# Patient Record
Sex: Male | Born: 1955 | Race: White | Hispanic: No | Marital: Married | Smoking: Light tobacco smoker
Health system: Southern US, Academic
[De-identification: ages and names within clinical notes are randomized; demographics above are authoritative.]

## PROBLEM LIST (undated history)

## (undated) DIAGNOSIS — M1990 Unspecified osteoarthritis, unspecified site: Secondary | ICD-10-CM

## (undated) DIAGNOSIS — Z8709 Personal history of other diseases of the respiratory system: Secondary | ICD-10-CM

## (undated) DIAGNOSIS — T4145XA Adverse effect of unspecified anesthetic, initial encounter: Secondary | ICD-10-CM

## (undated) DIAGNOSIS — G8929 Other chronic pain: Secondary | ICD-10-CM

## (undated) DIAGNOSIS — G5600 Carpal tunnel syndrome, unspecified upper limb: Secondary | ICD-10-CM

## (undated) DIAGNOSIS — N400 Enlarged prostate without lower urinary tract symptoms: Secondary | ICD-10-CM

## (undated) DIAGNOSIS — G2581 Restless legs syndrome: Secondary | ICD-10-CM

## (undated) DIAGNOSIS — F988 Other specified behavioral and emotional disorders with onset usually occurring in childhood and adolescence: Secondary | ICD-10-CM

## (undated) DIAGNOSIS — G4730 Sleep apnea, unspecified: Secondary | ICD-10-CM

## (undated) DIAGNOSIS — R531 Weakness: Secondary | ICD-10-CM

## (undated) DIAGNOSIS — M62838 Other muscle spasm: Secondary | ICD-10-CM

## (undated) DIAGNOSIS — F419 Anxiety disorder, unspecified: Secondary | ICD-10-CM

## (undated) DIAGNOSIS — M549 Dorsalgia, unspecified: Secondary | ICD-10-CM

## (undated) DIAGNOSIS — K219 Gastro-esophageal reflux disease without esophagitis: Secondary | ICD-10-CM

## (undated) DIAGNOSIS — Z860100 Personal history of colon polyps, unspecified: Secondary | ICD-10-CM

## (undated) DIAGNOSIS — T8859XA Other complications of anesthesia, initial encounter: Secondary | ICD-10-CM

## (undated) DIAGNOSIS — M5430 Sciatica, unspecified side: Secondary | ICD-10-CM

## (undated) DIAGNOSIS — Z8719 Personal history of other diseases of the digestive system: Secondary | ICD-10-CM

## (undated) DIAGNOSIS — I10 Essential (primary) hypertension: Secondary | ICD-10-CM

## (undated) DIAGNOSIS — R0600 Dyspnea, unspecified: Secondary | ICD-10-CM

## (undated) DIAGNOSIS — F319 Bipolar disorder, unspecified: Secondary | ICD-10-CM

## (undated) DIAGNOSIS — Z87898 Personal history of other specified conditions: Secondary | ICD-10-CM

## (undated) DIAGNOSIS — R519 Headache, unspecified: Secondary | ICD-10-CM

## (undated) DIAGNOSIS — Z973 Presence of spectacles and contact lenses: Secondary | ICD-10-CM

## (undated) DIAGNOSIS — K559 Vascular disorder of intestine, unspecified: Secondary | ICD-10-CM

## (undated) DIAGNOSIS — K589 Irritable bowel syndrome without diarrhea: Secondary | ICD-10-CM

## (undated) DIAGNOSIS — K649 Unspecified hemorrhoids: Secondary | ICD-10-CM

## (undated) DIAGNOSIS — N529 Male erectile dysfunction, unspecified: Secondary | ICD-10-CM

## (undated) DIAGNOSIS — E785 Hyperlipidemia, unspecified: Secondary | ICD-10-CM

## (undated) DIAGNOSIS — Z9989 Dependence on other enabling machines and devices: Secondary | ICD-10-CM

## (undated) DIAGNOSIS — H9319 Tinnitus, unspecified ear: Secondary | ICD-10-CM

## (undated) DIAGNOSIS — Z8669 Personal history of other diseases of the nervous system and sense organs: Secondary | ICD-10-CM

## (undated) HISTORY — PX: OTHER SURGICAL HISTORY: SHX169

## (undated) HISTORY — PX: VASECTOMY: SHX75

## (undated) HISTORY — DX: Bipolar disorder, unspecified: F31.9

## (undated) HISTORY — PX: HERNIA REPAIR: SHX51

## (undated) HISTORY — PX: COLONOSCOPY: SHX174

## (undated) HISTORY — DX: Other specified behavioral and emotional disorders with onset usually occurring in childhood and adolescence: F98.8

## (undated) HISTORY — PX: SHOULDER SURGERY: SHX246

## (undated) HISTORY — PX: LASIK: SHX215

## (undated) HISTORY — PX: TRANSURETHRAL RESECTION OF PROSTATE: SHX73

## (undated) HISTORY — DX: Gastro-esophageal reflux disease without esophagitis: K21.9

## (undated) HISTORY — PX: ESOPHAGOGASTRODUODENOSCOPY: SHX1529

## (undated) HISTORY — PX: HX CARPAL TUNNEL RELEASE: SHX101

## (undated) HISTORY — PX: HX APPENDECTOMY: SHX54

## (undated) HISTORY — PX: HX TURP: SHX73

## (undated) HISTORY — PX: HX LUMBAR FUSION: SHX111

## (undated) HISTORY — PX: ELBOW SURGERY: SHX618

## (undated) HISTORY — DX: Unspecified hemorrhoids: K64.9

## (undated) HISTORY — DX: Vascular disorder of intestine, unspecified: K55.9

## (undated) HISTORY — PX: ULNAR TUNNEL RELEASE: SHX820

## (undated) HISTORY — PX: HX HERNIA REPAIR: SHX51

## (undated) HISTORY — PX: HX SHOULDER SURGERY: 2100001311

## (undated) HISTORY — PX: INGUINAL HERNIA REPAIR: SUR1180

## (undated) HISTORY — PX: HX VASECTOMY: SHX75

## (undated) NOTE — Progress Notes (Signed)
 Formatting of this note might be different from the original.  Subjective   Patient ID: [name] [name] is a 52 y.o. male presenting to the Urgent Care with a chief complaint of Pain (Pain on left abdomen that radiates down left leg x 1 week- gets injections however not able to get in anytime soon).    Pain    Pt is here with complaint of chronic low back pain--has had surgeries and normally gets steroid injections in the lumbar spine from the [place] but can't get there for a month.  Would like a IM steroid shot here to hold him over.     Objective   BP 105/70 (BP Location: Left arm, Patient Position: Sitting, BP Cuff Size: Adult)   Pulse 70   Temp 36.7 C (98.1 F) (Tympanic)   Resp 18   Ht 1.778 m (5' 10)   Wt 81.6 kg (180 lb)   SpO2 97%   BMI 25.83 kg/m     Physical Exam  Vitals and nursing note reviewed.   Constitutional:       Appearance: Normal appearance.   Cardiovascular:      Rate and Rhythm: Normal rate and regular rhythm.      Heart sounds: Normal heart sounds.   Pulmonary:      Effort: Pulmonary effort is normal.      Breath sounds: Normal breath sounds.   Musculoskeletal:         General: Normal range of motion.   Skin:     General: Skin is dry.   Neurological:      General: No focal deficit present.      Mental Status: He is alert and oriented to person, place, and time.         Assessment & Plan    Assessment & Plan  Chronic left-sided low back pain with left-sided sciatica    Orders:    dexAMETHasone  (Decadron ) injection 10 mg        In-House Lab Results:   No results found for this or any previous visit.     In-House Imaging Reads:      Answered all patient questions, aftercare instructions provided.  Return precautions dicussed.  Pt understands and agrees with plan.    Procedure Documentation:  Procedures     ED Course & MDM   MDM - Medical Decision Making: Home with return precautions  Electronically signed by Royston Lolling, CRNP at 04/10/2024  9:49 AM EDT

## (undated) NOTE — Progress Notes (Signed)
 Formatting of this note might be different from the original.  Subjective   Patient ID: Sean Wall is a 52 y.o. male presenting to the Urgent Care with a chief complaint of Pain (Pain on left abdomen that radiates down left leg x 1 week- gets injections however not able to get in anytime soon).    Pain    Pt is here with complaint of chronic low back pain--has had surgeries and normally gets steroid injections in the lumbar spine from the [place] but can't get there for a month.  Would like a IM steroid shot here to hold him over.     Objective   BP 105/70 (BP Location: Left arm, Patient Position: Sitting, BP Cuff Size: Adult)   Pulse 70   Temp 36.7 C (98.1 F) (Tympanic)   Resp 18   Ht 1.778 m (5' 10)   Wt 81.6 kg (180 lb)   SpO2 97%   BMI 25.83 kg/m     Physical Exam  Vitals and nursing note reviewed.   Constitutional:       Appearance: Normal appearance.   Cardiovascular:      Rate and Rhythm: Normal rate and regular rhythm.      Heart sounds: Normal heart sounds.   Pulmonary:      Effort: Pulmonary effort is normal.      Breath sounds: Normal breath sounds.   Musculoskeletal:         General: Normal range of motion.   Skin:     General: Skin is dry.   Neurological:      General: No focal deficit present.      Mental Status: He is alert and oriented to person, place, and time.         Assessment & Plan    Assessment & Plan  Chronic left-sided low back pain with left-sided sciatica    Orders:    dexAMETHasone  (Decadron ) injection 10 mg        In-House Lab Results:   No results found for this or any previous visit.     In-House Imaging Reads:      Answered all patient questions, aftercare instructions provided.  Return precautions dicussed.  Pt understands and agrees with plan.    Procedure Documentation:  Procedures     ED Course & MDM   MDM - Medical Decision Making: Home with return precautions  Electronically signed by Royston Lolling, CRNP at 04/10/2024  9:49 AM EDT

---

## 1976-08-15 HISTORY — PX: APPENDECTOMY: SHX54

## 1976-08-15 HISTORY — PX: HX APPENDECTOMY: SHX54

## 1983-08-16 DIAGNOSIS — Z8711 Personal history of peptic ulcer disease: Secondary | ICD-10-CM

## 1983-08-16 HISTORY — DX: Personal history of peptic ulcer disease: Z87.11

## 1988-08-15 HISTORY — PX: GANGLION CYST EXCISION: SHX1691

## 1988-08-15 HISTORY — PX: HX CYST REMOVAL: SHX22

## 1989-04-25 ENCOUNTER — Encounter: Payer: Self-pay | Admitting: Internal Medicine

## 1989-08-15 HISTORY — PX: INGUINAL HERNIA REPAIR: SUR1180

## 1998-10-02 ENCOUNTER — Encounter: Payer: Self-pay | Admitting: Internal Medicine

## 1998-10-02 ENCOUNTER — Other Ambulatory Visit: Admission: RE | Admit: 1998-10-02 | Discharge: 1998-10-02 | Payer: Self-pay | Admitting: Gastroenterology

## 1998-10-05 ENCOUNTER — Other Ambulatory Visit: Admission: RE | Admit: 1998-10-05 | Discharge: 1998-10-05 | Payer: Self-pay | Admitting: Gastroenterology

## 1998-10-05 ENCOUNTER — Encounter: Payer: Self-pay | Admitting: Internal Medicine

## 1999-12-24 ENCOUNTER — Other Ambulatory Visit: Admission: RE | Admit: 1999-12-24 | Discharge: 1999-12-24 | Payer: Self-pay | Admitting: Urology

## 2002-08-15 HISTORY — PX: LASIK: SHX215

## 2003-07-21 ENCOUNTER — Ambulatory Visit (HOSPITAL_COMMUNITY): Admission: RE | Admit: 2003-07-21 | Discharge: 2003-07-21 | Payer: Self-pay | Admitting: Orthopedic Surgery

## 2003-08-16 HISTORY — PX: HX NOSE/SINUS SURGERY: 2100001179

## 2003-08-16 HISTORY — PX: CPAP: SHX449

## 2003-08-21 ENCOUNTER — Ambulatory Visit (HOSPITAL_COMMUNITY): Admission: RE | Admit: 2003-08-21 | Discharge: 2003-08-21 | Payer: Self-pay | Admitting: Orthopedic Surgery

## 2003-08-21 ENCOUNTER — Ambulatory Visit (HOSPITAL_BASED_OUTPATIENT_CLINIC_OR_DEPARTMENT_OTHER): Admission: RE | Admit: 2003-08-21 | Discharge: 2003-08-21 | Payer: Self-pay | Admitting: Orthopedic Surgery

## 2004-03-11 ENCOUNTER — Encounter: Payer: Self-pay | Admitting: Internal Medicine

## 2004-03-11 ENCOUNTER — Encounter (INDEPENDENT_AMBULATORY_CARE_PROVIDER_SITE_OTHER): Payer: Self-pay | Admitting: Gastroenterology

## 2004-06-15 ENCOUNTER — Ambulatory Visit (HOSPITAL_COMMUNITY): Admission: RE | Admit: 2004-06-15 | Discharge: 2004-06-15 | Payer: Self-pay | Admitting: Internal Medicine

## 2004-11-30 ENCOUNTER — Ambulatory Visit: Payer: Self-pay | Admitting: Internal Medicine

## 2005-02-24 ENCOUNTER — Ambulatory Visit: Payer: Self-pay | Admitting: Internal Medicine

## 2005-03-02 ENCOUNTER — Ambulatory Visit: Payer: Self-pay | Admitting: Internal Medicine

## 2005-04-01 ENCOUNTER — Ambulatory Visit: Payer: Self-pay | Admitting: Internal Medicine

## 2005-06-13 ENCOUNTER — Ambulatory Visit: Payer: Self-pay | Admitting: Internal Medicine

## 2005-07-21 ENCOUNTER — Ambulatory Visit: Payer: Self-pay | Admitting: Internal Medicine

## 2005-08-11 ENCOUNTER — Ambulatory Visit: Payer: Self-pay | Admitting: Internal Medicine

## 2005-11-21 ENCOUNTER — Ambulatory Visit: Payer: Self-pay | Admitting: Internal Medicine

## 2006-01-16 ENCOUNTER — Ambulatory Visit: Payer: Self-pay | Admitting: Internal Medicine

## 2006-03-03 ENCOUNTER — Ambulatory Visit: Payer: Self-pay | Admitting: Internal Medicine

## 2006-03-10 ENCOUNTER — Ambulatory Visit (HOSPITAL_BASED_OUTPATIENT_CLINIC_OR_DEPARTMENT_OTHER): Admission: RE | Admit: 2006-03-10 | Discharge: 2006-03-10 | Payer: Self-pay | Admitting: Orthopedic Surgery

## 2006-03-10 HISTORY — PX: ULNAR NERVE REPAIR: SHX2594

## 2006-03-22 ENCOUNTER — Ambulatory Visit: Payer: Self-pay | Admitting: Internal Medicine

## 2006-04-05 ENCOUNTER — Ambulatory Visit: Payer: Self-pay | Admitting: Internal Medicine

## 2006-05-24 ENCOUNTER — Ambulatory Visit: Payer: Self-pay | Admitting: Internal Medicine

## 2006-06-28 ENCOUNTER — Ambulatory Visit: Payer: Self-pay | Admitting: Internal Medicine

## 2006-07-12 ENCOUNTER — Ambulatory Visit: Payer: Self-pay | Admitting: Internal Medicine

## 2006-10-03 ENCOUNTER — Ambulatory Visit: Payer: Self-pay | Admitting: Internal Medicine

## 2006-11-06 ENCOUNTER — Ambulatory Visit (HOSPITAL_BASED_OUTPATIENT_CLINIC_OR_DEPARTMENT_OTHER): Admission: RE | Admit: 2006-11-06 | Discharge: 2006-11-06 | Payer: Self-pay | Admitting: Otolaryngology

## 2006-11-06 ENCOUNTER — Encounter: Payer: Self-pay | Admitting: Pulmonary Disease

## 2006-11-12 ENCOUNTER — Ambulatory Visit: Payer: Self-pay | Admitting: Internal Medicine

## 2007-01-01 ENCOUNTER — Ambulatory Visit: Payer: Self-pay | Admitting: Pulmonary Disease

## 2007-01-04 ENCOUNTER — Ambulatory Visit (HOSPITAL_COMMUNITY): Admission: RE | Admit: 2007-01-04 | Discharge: 2007-01-05 | Payer: Self-pay | Admitting: Otolaryngology

## 2007-01-04 HISTORY — PX: NASAL SEPTOPLASTY W/ TURBINOPLASTY: SHX2070

## 2007-01-29 ENCOUNTER — Ambulatory Visit: Payer: Self-pay | Admitting: Internal Medicine

## 2007-03-20 ENCOUNTER — Ambulatory Visit (HOSPITAL_BASED_OUTPATIENT_CLINIC_OR_DEPARTMENT_OTHER): Admission: RE | Admit: 2007-03-20 | Discharge: 2007-03-20 | Payer: Self-pay | Admitting: Otolaryngology

## 2007-03-24 ENCOUNTER — Ambulatory Visit: Payer: Self-pay | Admitting: Internal Medicine

## 2007-05-29 ENCOUNTER — Ambulatory Visit: Payer: Self-pay | Admitting: Ophthalmology

## 2007-07-02 ENCOUNTER — Ambulatory Visit: Payer: Self-pay | Admitting: Internal Medicine

## 2007-07-02 DIAGNOSIS — K219 Gastro-esophageal reflux disease without esophagitis: Secondary | ICD-10-CM | POA: Insufficient documentation

## 2007-07-02 LAB — CONVERTED CEMR LAB
ALT: 26 units/L (ref 0–53)
AST: 20 units/L (ref 0–37)
Albumin: 3.6 g/dL (ref 3.5–5.2)
Alkaline Phosphatase: 42 units/L (ref 39–117)
BUN: 17 mg/dL (ref 6–23)
Basophils Absolute: 0 10*3/uL (ref 0.0–0.1)
Basophils Relative: 0.3 % (ref 0.0–1.0)
Bilirubin, Direct: 0.1 mg/dL (ref 0.0–0.3)
CO2: 29 meq/L (ref 19–32)
Calcium: 8.8 mg/dL (ref 8.4–10.5)
Chloride: 108 meq/L (ref 96–112)
Cholesterol: 201 mg/dL (ref 0–200)
Creatinine, Ser: 1.1 mg/dL (ref 0.4–1.5)
Direct LDL: 147.2 mg/dL
Eosinophils Absolute: 0.2 10*3/uL (ref 0.0–0.6)
Eosinophils Relative: 4.4 % (ref 0.0–5.0)
GFR calc Af Amer: 90 mL/min
GFR calc non Af Amer: 74 mL/min
Glucose, Bld: 99 mg/dL (ref 70–99)
HCT: 40.3 % (ref 39.0–52.0)
HDL: 33 mg/dL — ABNORMAL LOW (ref >39.0)
Hemoglobin: 13.8 g/dL (ref 13.0–17.0)
Lymphocytes Relative: 36.6 % (ref 12.0–46.0)
MCHC: 34.2 g/dL (ref 30.0–36.0)
MCV: 88.7 fL (ref 78.0–100.0)
Monocytes Absolute: 0.6 10*3/uL (ref 0.2–0.7)
Monocytes Relative: 12.7 % — ABNORMAL HIGH (ref 3.0–11.0)
Neutro Abs: 2.2 10*3/uL (ref 1.4–7.7)
Neutrophils Relative %: 46 % (ref 43.0–77.0)
PSA: 1.31 ng/mL (ref 0.10–4.00)
Platelets: 289 10*3/uL (ref 150–400)
Potassium: 4.1 meq/L (ref 3.5–5.1)
RBC: 4.54 M/uL (ref 4.22–5.81)
RDW: 14 % (ref 11.5–14.6)
Sodium: 142 meq/L (ref 135–145)
TSH: 2.78 microintl units/mL (ref 0.35–5.50)
Total Bilirubin: 0.7 mg/dL (ref 0.3–1.2)
Total CHOL/HDL Ratio: 6.1
Total Protein: 6.3 g/dL (ref 6.0–8.3)
Triglycerides: 217 mg/dL (ref 0–149)
VLDL: 43 mg/dL — ABNORMAL HIGH (ref 0–40)
WBC: 4.7 10*3/uL (ref 4.5–10.5)

## 2007-07-10 ENCOUNTER — Encounter: Payer: Self-pay | Admitting: Internal Medicine

## 2007-07-11 ENCOUNTER — Ambulatory Visit: Payer: Self-pay | Admitting: Internal Medicine

## 2007-07-11 DIAGNOSIS — F319 Bipolar disorder, unspecified: Secondary | ICD-10-CM | POA: Insufficient documentation

## 2007-07-11 DIAGNOSIS — M545 Low back pain, unspecified: Secondary | ICD-10-CM | POA: Insufficient documentation

## 2007-07-11 DIAGNOSIS — M1990 Unspecified osteoarthritis, unspecified site: Secondary | ICD-10-CM | POA: Insufficient documentation

## 2007-09-13 ENCOUNTER — Encounter: Payer: Self-pay | Admitting: Internal Medicine

## 2007-09-25 ENCOUNTER — Ambulatory Visit: Payer: Self-pay | Admitting: Cardiology

## 2007-10-12 ENCOUNTER — Ambulatory Visit: Payer: Self-pay

## 2007-11-27 ENCOUNTER — Ambulatory Visit: Payer: Self-pay | Admitting: Internal Medicine

## 2007-11-27 DIAGNOSIS — E785 Hyperlipidemia, unspecified: Secondary | ICD-10-CM | POA: Insufficient documentation

## 2007-11-28 ENCOUNTER — Ambulatory Visit: Payer: Self-pay | Admitting: Psychology

## 2007-12-12 ENCOUNTER — Ambulatory Visit: Payer: Self-pay | Admitting: Psychology

## 2007-12-18 ENCOUNTER — Ambulatory Visit: Payer: Self-pay | Admitting: Psychology

## 2008-01-03 ENCOUNTER — Ambulatory Visit: Payer: Self-pay | Admitting: Psychology

## 2008-01-22 ENCOUNTER — Ambulatory Visit: Payer: Self-pay | Admitting: Psychology

## 2008-02-22 ENCOUNTER — Ambulatory Visit: Payer: Self-pay | Admitting: Internal Medicine

## 2008-02-22 LAB — CONVERTED CEMR LAB
ALT: 27 units/L (ref 0–53)
AST: 19 units/L (ref 0–37)
Albumin: 3.6 g/dL (ref 3.5–5.2)
Alkaline Phosphatase: 55 units/L (ref 39–117)
BUN: 18 mg/dL (ref 6–23)
Bilirubin, Direct: 0.1 mg/dL (ref 0.0–0.3)
CO2: 30 meq/L (ref 19–32)
Calcium: 8.9 mg/dL (ref 8.4–10.5)
Chloride: 106 meq/L (ref 96–112)
Cholesterol: 140 mg/dL (ref 0–200)
Creatinine, Ser: 1.2 mg/dL (ref 0.4–1.5)
GFR calc Af Amer: 81 mL/min
GFR calc non Af Amer: 67 mL/min
Glucose, Bld: 112 mg/dL — ABNORMAL HIGH (ref 70–99)
HDL: 34.2 mg/dL — ABNORMAL LOW (ref >39.0)
LDL Cholesterol: 94 mg/dL (ref 0–99)
Potassium: 4.5 meq/L (ref 3.5–5.1)
Sodium: 141 meq/L (ref 135–145)
Total Bilirubin: 0.6 mg/dL (ref 0.3–1.2)
Total CHOL/HDL Ratio: 4.1
Total CK: 101 units/L (ref 7–195)
Total Protein: 6.5 g/dL (ref 6.0–8.3)
Triglycerides: 60 mg/dL (ref 0–149)
VLDL: 12 mg/dL (ref 0–40)

## 2008-02-26 ENCOUNTER — Encounter: Payer: Self-pay | Admitting: Internal Medicine

## 2008-02-26 ENCOUNTER — Encounter: Payer: Self-pay | Admitting: Pulmonary Disease

## 2008-02-27 ENCOUNTER — Ambulatory Visit: Payer: Self-pay | Admitting: Internal Medicine

## 2008-02-27 DIAGNOSIS — M25519 Pain in unspecified shoulder: Secondary | ICD-10-CM | POA: Insufficient documentation

## 2008-02-27 DIAGNOSIS — N400 Enlarged prostate without lower urinary tract symptoms: Secondary | ICD-10-CM | POA: Insufficient documentation

## 2008-02-27 DIAGNOSIS — D481 Neoplasm of uncertain behavior of connective and other soft tissue: Secondary | ICD-10-CM | POA: Insufficient documentation

## 2008-02-28 ENCOUNTER — Ambulatory Visit: Payer: Self-pay | Admitting: Pulmonary Disease

## 2008-02-28 DIAGNOSIS — G4733 Obstructive sleep apnea (adult) (pediatric): Secondary | ICD-10-CM | POA: Insufficient documentation

## 2008-02-29 ENCOUNTER — Ambulatory Visit: Payer: Self-pay | Admitting: Psychology

## 2008-03-11 ENCOUNTER — Ambulatory Visit: Payer: Self-pay | Admitting: Psychology

## 2008-03-11 ENCOUNTER — Telehealth: Payer: Self-pay | Admitting: Internal Medicine

## 2008-03-19 ENCOUNTER — Encounter: Payer: Self-pay | Admitting: Pulmonary Disease

## 2008-03-20 ENCOUNTER — Telehealth: Payer: Self-pay | Admitting: Pulmonary Disease

## 2008-04-01 ENCOUNTER — Encounter: Payer: Self-pay | Admitting: Internal Medicine

## 2008-04-02 ENCOUNTER — Encounter: Payer: Self-pay | Admitting: Internal Medicine

## 2008-04-06 ENCOUNTER — Ambulatory Visit (HOSPITAL_COMMUNITY): Admission: RE | Admit: 2008-04-06 | Discharge: 2008-04-06 | Payer: Self-pay | Admitting: Surgery

## 2008-05-21 ENCOUNTER — Ambulatory Visit: Payer: Self-pay | Admitting: Pulmonary Disease

## 2008-06-04 ENCOUNTER — Ambulatory Visit: Payer: Self-pay | Admitting: Internal Medicine

## 2008-06-04 DIAGNOSIS — N400 Enlarged prostate without lower urinary tract symptoms: Secondary | ICD-10-CM | POA: Insufficient documentation

## 2008-06-25 ENCOUNTER — Telehealth (INDEPENDENT_AMBULATORY_CARE_PROVIDER_SITE_OTHER): Payer: Self-pay | Admitting: *Deleted

## 2008-07-09 ENCOUNTER — Telehealth (INDEPENDENT_AMBULATORY_CARE_PROVIDER_SITE_OTHER): Payer: Self-pay | Admitting: *Deleted

## 2008-07-19 ENCOUNTER — Encounter: Payer: Self-pay | Admitting: Pulmonary Disease

## 2008-09-10 ENCOUNTER — Ambulatory Visit: Payer: Self-pay | Admitting: Internal Medicine

## 2008-09-10 DIAGNOSIS — R109 Unspecified abdominal pain: Secondary | ICD-10-CM | POA: Insufficient documentation

## 2008-09-10 DIAGNOSIS — R198 Other specified symptoms and signs involving the digestive system and abdomen: Secondary | ICD-10-CM | POA: Insufficient documentation

## 2008-10-01 ENCOUNTER — Ambulatory Visit (HOSPITAL_COMMUNITY): Admission: RE | Admit: 2008-10-01 | Discharge: 2008-10-01 | Payer: Self-pay | Admitting: Urology

## 2008-10-01 HISTORY — PX: PROSTATE SURGERY: SHX751

## 2008-11-12 ENCOUNTER — Telehealth (INDEPENDENT_AMBULATORY_CARE_PROVIDER_SITE_OTHER): Payer: Self-pay | Admitting: *Deleted

## 2008-11-13 ENCOUNTER — Encounter: Payer: Self-pay | Admitting: Internal Medicine

## 2008-12-17 ENCOUNTER — Telehealth: Payer: Self-pay | Admitting: Internal Medicine

## 2008-12-26 ENCOUNTER — Ambulatory Visit: Payer: Self-pay | Admitting: Internal Medicine

## 2008-12-26 LAB — CONVERTED CEMR LAB
ALT: 20 units/L (ref 0–53)
AST: 16 units/L (ref 0–37)
Albumin: 3.8 g/dL (ref 3.5–5.2)
Alkaline Phosphatase: 43 units/L (ref 39–117)
BUN: 25 mg/dL — ABNORMAL HIGH (ref 6–23)
Basophils Absolute: 0 10*3/uL (ref 0.0–0.1)
Basophils Relative: 0.5 % (ref 0.0–3.0)
Bilirubin, Direct: 0 mg/dL (ref 0.0–0.3)
CO2: 32 meq/L (ref 19–32)
Calcium: 9.1 mg/dL (ref 8.4–10.5)
Chloride: 109 meq/L (ref 96–112)
Cholesterol: 136 mg/dL (ref 0–200)
Creatinine, Ser: 1.1 mg/dL (ref 0.4–1.5)
Direct LDL: 77.5 mg/dL
Eosinophils Absolute: 0.3 10*3/uL (ref 0.0–0.7)
Eosinophils Relative: 5.8 % — ABNORMAL HIGH (ref 0.0–5.0)
GFR calc non Af Amer: 73.84 mL/min (ref >60)
Glucose, Bld: 104 mg/dL — ABNORMAL HIGH (ref 70–99)
HCT: 35.5 % — ABNORMAL LOW (ref 39.0–52.0)
HDL: 37.5 mg/dL — ABNORMAL LOW (ref >39.00)
Hemoglobin: 12.1 g/dL — ABNORMAL LOW (ref 13.0–17.0)
Lymphocytes Relative: 28.4 % (ref 12.0–46.0)
Lymphs Abs: 1.3 10*3/uL (ref 0.7–4.0)
MCHC: 34.2 g/dL (ref 30.0–36.0)
MCV: 87.9 fL (ref 78.0–100.0)
Monocytes Absolute: 0.5 10*3/uL (ref 0.1–1.0)
Monocytes Relative: 10.4 % (ref 3.0–12.0)
Neutro Abs: 2.5 10*3/uL (ref 1.4–7.7)
Neutrophils Relative %: 54.9 % (ref 43.0–77.0)
Platelets: 259 10*3/uL (ref 150.0–400.0)
Potassium: 4.2 meq/L (ref 3.5–5.1)
RBC: 4.03 M/uL — ABNORMAL LOW (ref 4.22–5.81)
RDW: 14.4 % (ref 11.5–14.6)
Sodium: 145 meq/L (ref 135–145)
TSH: 1.02 microintl units/mL (ref 0.35–5.50)
Total Bilirubin: 0.7 mg/dL (ref 0.3–1.2)
Total CHOL/HDL Ratio: 4
Total Protein: 6.2 g/dL (ref 6.0–8.3)
Triglycerides: 234 mg/dL — ABNORMAL HIGH (ref 0.0–149.0)
VLDL: 46.8 mg/dL — ABNORMAL HIGH (ref 0.0–40.0)
WBC: 4.6 10*3/uL (ref 4.5–10.5)

## 2008-12-30 ENCOUNTER — Ambulatory Visit: Payer: Self-pay | Admitting: Internal Medicine

## 2009-01-20 ENCOUNTER — Ambulatory Visit: Payer: Self-pay | Admitting: Internal Medicine

## 2009-02-03 ENCOUNTER — Ambulatory Visit: Payer: Self-pay | Admitting: Internal Medicine

## 2009-02-03 ENCOUNTER — Encounter: Payer: Self-pay | Admitting: Internal Medicine

## 2009-02-03 LAB — HM COLONOSCOPY

## 2009-02-06 ENCOUNTER — Encounter: Payer: Self-pay | Admitting: Internal Medicine

## 2009-02-08 ENCOUNTER — Encounter: Payer: Self-pay | Admitting: Internal Medicine

## 2009-03-06 ENCOUNTER — Emergency Department (HOSPITAL_BASED_OUTPATIENT_CLINIC_OR_DEPARTMENT_OTHER): Admission: EM | Admit: 2009-03-06 | Discharge: 2009-03-06 | Payer: Self-pay | Admitting: Emergency Medicine

## 2009-03-08 ENCOUNTER — Emergency Department (HOSPITAL_BASED_OUTPATIENT_CLINIC_OR_DEPARTMENT_OTHER): Admission: EM | Admit: 2009-03-08 | Discharge: 2009-03-08 | Payer: Self-pay | Admitting: Emergency Medicine

## 2009-03-20 ENCOUNTER — Telehealth: Payer: Self-pay | Admitting: Internal Medicine

## 2009-03-21 ENCOUNTER — Ambulatory Visit: Payer: Self-pay | Admitting: Family Medicine

## 2009-03-21 DIAGNOSIS — J029 Acute pharyngitis, unspecified: Secondary | ICD-10-CM | POA: Insufficient documentation

## 2009-03-21 LAB — CONVERTED CEMR LAB: Rapid Strep: NEGATIVE

## 2009-06-22 ENCOUNTER — Telehealth (INDEPENDENT_AMBULATORY_CARE_PROVIDER_SITE_OTHER): Payer: Self-pay | Admitting: *Deleted

## 2009-07-03 ENCOUNTER — Telehealth (INDEPENDENT_AMBULATORY_CARE_PROVIDER_SITE_OTHER): Payer: Self-pay | Admitting: *Deleted

## 2009-07-08 ENCOUNTER — Ambulatory Visit: Payer: Self-pay | Admitting: Internal Medicine

## 2009-07-08 DIAGNOSIS — H9319 Tinnitus, unspecified ear: Secondary | ICD-10-CM | POA: Insufficient documentation

## 2009-07-29 ENCOUNTER — Telehealth (INDEPENDENT_AMBULATORY_CARE_PROVIDER_SITE_OTHER): Payer: Self-pay | Admitting: *Deleted

## 2009-07-30 ENCOUNTER — Ambulatory Visit: Payer: Self-pay | Admitting: Psychology

## 2009-08-31 ENCOUNTER — Encounter: Payer: Self-pay | Admitting: Internal Medicine

## 2009-09-11 ENCOUNTER — Ambulatory Visit: Payer: Self-pay | Admitting: Psychology

## 2009-09-25 ENCOUNTER — Ambulatory Visit: Payer: Self-pay | Admitting: Psychology

## 2010-01-25 ENCOUNTER — Ambulatory Visit: Payer: Self-pay | Admitting: Internal Medicine

## 2010-01-25 LAB — CONVERTED CEMR LAB
ALT: 20 units/L (ref 0–53)
AST: 24 units/L (ref 0–37)
Albumin: 3.9 g/dL (ref 3.5–5.2)
Alkaline Phosphatase: 38 units/L — ABNORMAL LOW (ref 39–117)
BUN: 33 mg/dL — ABNORMAL HIGH (ref 6–23)
Basophils Absolute: 0 10*3/uL (ref 0.0–0.1)
Basophils Relative: 0.5 % (ref 0.0–3.0)
Bilirubin Urine: NEGATIVE
Bilirubin, Direct: 0.1 mg/dL (ref 0.0–0.3)
CO2: 29 meq/L (ref 19–32)
Calcium: 9 mg/dL (ref 8.4–10.5)
Chloride: 108 meq/L (ref 96–112)
Cholesterol: 144 mg/dL (ref 0–200)
Creatinine, Ser: 1 mg/dL (ref 0.4–1.5)
Eosinophils Absolute: 0.2 10*3/uL (ref 0.0–0.7)
Eosinophils Relative: 5 % (ref 0.0–5.0)
GFR calc non Af Amer: 86.05 mL/min (ref >60)
Glucose, Bld: 109 mg/dL — ABNORMAL HIGH (ref 70–99)
HCT: 35.5 % — ABNORMAL LOW (ref 39.0–52.0)
HDL: 52.4 mg/dL (ref >39.00)
Hemoglobin, Urine: NEGATIVE
Hemoglobin: 12.2 g/dL — ABNORMAL LOW (ref 13.0–17.0)
Ketones, ur: NEGATIVE mg/dL
LDL Cholesterol: 82 mg/dL (ref 0–99)
Leukocytes, UA: NEGATIVE
Lymphocytes Relative: 25.1 % (ref 12.0–46.0)
Lymphs Abs: 1 10*3/uL (ref 0.7–4.0)
MCHC: 34.3 g/dL (ref 30.0–36.0)
MCV: 88.6 fL (ref 78.0–100.0)
Monocytes Absolute: 0.5 10*3/uL (ref 0.1–1.0)
Monocytes Relative: 11.6 % (ref 3.0–12.0)
Neutro Abs: 2.4 10*3/uL (ref 1.4–7.7)
Neutrophils Relative %: 57.8 % (ref 43.0–77.0)
Nitrite: NEGATIVE
PSA: 2.17 ng/mL (ref 0.10–4.00)
Platelets: 252 10*3/uL (ref 150.0–400.0)
Potassium: 4.8 meq/L (ref 3.5–5.1)
RBC: 4 M/uL — ABNORMAL LOW (ref 4.22–5.81)
RDW: 15.5 % — ABNORMAL HIGH (ref 11.5–14.6)
Sodium: 142 meq/L (ref 135–145)
Specific Gravity, Urine: >=1.03 (ref 1.000–1.030)
TSH: 1.45 microintl units/mL (ref 0.35–5.50)
Total Bilirubin: 0.2 mg/dL — ABNORMAL LOW (ref 0.3–1.2)
Total CHOL/HDL Ratio: 3
Total Protein, Urine: NEGATIVE mg/dL
Total Protein: 6.3 g/dL (ref 6.0–8.3)
Triglycerides: 46 mg/dL (ref 0.0–149.0)
Urine Glucose: NEGATIVE mg/dL
Urobilinogen, UA: 0.2 (ref 0.0–1.0)
VLDL: 9.2 mg/dL (ref 0.0–40.0)
WBC: 4.2 10*3/uL — ABNORMAL LOW (ref 4.5–10.5)
pH: 5.5 (ref 5.0–8.0)

## 2010-01-26 ENCOUNTER — Ambulatory Visit: Payer: Self-pay | Admitting: Internal Medicine

## 2010-01-27 ENCOUNTER — Ambulatory Visit: Payer: Self-pay | Admitting: Internal Medicine

## 2010-01-27 LAB — CONVERTED CEMR LAB: Testosterone: 369.07 ng/dL (ref 350.00–890.00)

## 2010-02-10 ENCOUNTER — Telehealth: Payer: Self-pay | Admitting: Internal Medicine

## 2010-03-09 ENCOUNTER — Ambulatory Visit: Payer: Self-pay | Admitting: Psychology

## 2010-03-09 ENCOUNTER — Ambulatory Visit: Payer: Self-pay | Admitting: Pulmonary Disease

## 2010-03-22 ENCOUNTER — Telehealth (INDEPENDENT_AMBULATORY_CARE_PROVIDER_SITE_OTHER): Payer: Self-pay | Admitting: *Deleted

## 2010-04-01 ENCOUNTER — Telehealth (INDEPENDENT_AMBULATORY_CARE_PROVIDER_SITE_OTHER): Payer: Self-pay | Admitting: *Deleted

## 2010-04-07 ENCOUNTER — Ambulatory Visit: Payer: Self-pay | Admitting: Internal Medicine

## 2010-04-20 ENCOUNTER — Ambulatory Visit: Payer: Self-pay | Admitting: Psychology

## 2010-05-25 ENCOUNTER — Ambulatory Visit: Payer: Self-pay | Admitting: Internal Medicine

## 2010-07-19 ENCOUNTER — Telehealth: Payer: Self-pay | Admitting: Internal Medicine

## 2010-07-20 ENCOUNTER — Ambulatory Visit: Payer: Self-pay | Admitting: Gastroenterology

## 2010-07-20 ENCOUNTER — Encounter: Payer: Self-pay | Admitting: Internal Medicine

## 2010-07-20 DIAGNOSIS — R1319 Other dysphagia: Secondary | ICD-10-CM | POA: Insufficient documentation

## 2010-07-20 DIAGNOSIS — Z860100 Personal history of colon polyps, unspecified: Secondary | ICD-10-CM | POA: Insufficient documentation

## 2010-07-23 ENCOUNTER — Ambulatory Visit (HOSPITAL_COMMUNITY)
Admission: RE | Admit: 2010-07-23 | Discharge: 2010-07-23 | Payer: Self-pay | Source: Home / Self Care | Attending: Internal Medicine | Admitting: Internal Medicine

## 2010-07-23 ENCOUNTER — Encounter: Payer: Self-pay | Admitting: Internal Medicine

## 2010-08-20 ENCOUNTER — Encounter: Payer: Self-pay | Admitting: Pulmonary Disease

## 2010-08-30 ENCOUNTER — Ambulatory Visit
Admission: RE | Admit: 2010-08-30 | Discharge: 2010-08-30 | Payer: Self-pay | Source: Home / Self Care | Attending: Internal Medicine | Admitting: Internal Medicine

## 2010-08-30 DIAGNOSIS — J0100 Acute maxillary sinusitis, unspecified: Secondary | ICD-10-CM | POA: Insufficient documentation

## 2010-09-14 NOTE — Procedures (Signed)
Summary: Upper Endoscopy w/DIL  Patient: [name] Note: All result statuses are Final unless otherwise noted.  Tests: (1) Upper Endoscopy w/DIL (UED)  UED Upper Endoscopy w/DIL                             DONE (C)     [facility]     [address] [place], [place]  [address]           ENDOSCOPY PROCEDURE REPORT           PATIENT:  [name] [name], [name] [name]  MR#:  [id number]     BIRTHDATE:  06-24-1956, 54 yrs. old  GENDER:  male           ENDOSCOPIST:  Iva Boop, MD, [facility]           PROCEDURE DATE:  07/23/2010     PROCEDURE:  EGD, diagnostic, Maloney Dilation of the Esophagus     ASA CLASS:  Class II     INDICATIONS:  1) dysphagia           MEDICATIONS:   Fentanyl 75 CORRECTION 50 mcg IV, Versed 6 mg IV,     Benadryl 25 mg IV     TOPICAL ANESTHETIC:  Cetacaine Spray           DESCRIPTION OF PROCEDURE:   After the risks benefits and     alternatives of the procedure were thoroughly explained, informed     consent was obtained.  The Pentax EGD [id number] endoscope was     introduced through the mouth and advanced to the second portion of     the duodenum, without limitations.  The instrument was slowly     withdrawn as the mucosa was carefully examined.     <<PROCEDUREIMAGES>>           The upper, middle, and distal third of the esophagus were     carefully inspected and no abnormalities were noted but the upper     esophageal sphincter region was subjectively stenotic by feel. The     z-line was well seen at the GEJ (40 cm). The endoscope was pushed     into the fundus which was normal including a retroflexed view. The     antrum, first and second part of the duodenum were unremarkable.     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           REPEAT EXAM:  In for as needed.           Iva Boop, MD, [facility]           CC:  The Patient           n.     REVISED:  07/23/2010 01:31 PM     eSIGNED:  Iva Boop at 07/23/2010 01:31 PM           Kemppainen, Onalee Hua, [id number]  Note: An exclamation mark (!) indicates a result that was not dispersed into the flowsheet. Document Creation Date: 07/23/2010 1:31 PM _______________________________________________________________________  (1) Order result status: Final Collection or observation date-time: 07/23/2010 13:13 Requested date-time:  Receipt date-time:  Reported date-time:  Referring Physician:   Ordering Physician: Stan Head [phone]) Specimen Source:  Source: Launa Grill Order Number: [id number] Lab site:

## 2010-09-14 NOTE — Letter (Signed)
Summary: EGD Instructions  [facility]  [address] [place], [place] [address]   Phone: [phone]  Fax: [phone]       [name] [name]    [birth year]    MRN: [id number]       Procedure Day /Date:07-23-2010     Arrival Time: 10:30 _     Procedure time: 11:30 AM    Location of Procedure:                     X    [facility] ( Outpatient Registration)    PREPARATION FOR ENDOSCOPY   On 07-23-2010 THE DAY OF THE PROCEDURE:  1.   No solid foods, milk or milk products are allowed after midnight the night before your procedure.  2.   Do not drink anything colored red or purple.  Avoid juices with pulp.  No orange juice.  3.  You may drink clear liquids until 9:30 AM which is 2 hours before your procedure.                                                                                                CLEAR LIQUIDS INCLUDE: Water Jello Ice Popsicles Tea (sugar ok, no milk/cream) Powdered fruit flavored drinks Coffee (sugar ok, no milk/cream) Gatorade Juice: apple, white grape, white cranberry  Lemonade Clear bullion, consomm, broth Carbonated beverages (any kind) Strained chicken noodle soup Hard Candy   MEDICATION INSTRUCTIONS  Unless otherwise instructed, you should take regular prescription medications with a small sip of water as early as possible the morning of your procedure.         OTHER INSTRUCTIONS  You will need a responsible adult at least 57 years of age to accompany you and drive you home.   This person must remain in the waiting room during your procedure.  Wear loose fitting clothing that is easily removed.  Leave jewelry and other valuables at home.  However, you may wish to bring a book to read or an iPod/MP3 player to listen to music as you wait for your procedure to start.  Remove all body piercing jewelry and leave at home.  Total time from sign-in until discharge is approximately 2-3 hours.  You should go home  directly after your procedure and rest.  You can resume normal activities the day after your procedure.  The day of your procedure you should not:   Drive   Make legal decisions   Operate machinery   Drink alcohol   Return to work  You will receive specific instructions about eating, activities and medications before you leave.    The above instructions have been reviewed and explained to me by   _______________________    I fully understand and can verbalize these instructions _____________________________ Date _________     Appended Document: EGD Instructions [name] [name], pt's wife the instructions for the EGD scheduled for 07-23-2010 at 11:30 AM.

## 2010-09-14 NOTE — Progress Notes (Signed)
Summary: Triage   Phone Note Call from Patient Call back at Home Phone [phone] Call back at [phone]   Caller: Patient Call For: Dr. Leone Payor Reason for Call: Talk to Nurse Summary of Call: Pt ishaving some problems swallowing, when he eats bread especially,  Initial call taken by: [place] Johnson,  July 19, 2010 4:11 PM  Follow-up for Phone Call        Called and spoke with patient's wife, she states the patient is c/o problems with intermittent dysphagia. Offered appointment with Mike Gip, PA, waiting for patient to call back for appointment date and time. Selinda Michaels RN  July 19, 2010 4:30 PM  Additional Follow-up for Phone Call Additional follow up Details #1::        Patient called back and given appointment date and time. Additional Follow-up by: Selinda Michaels RN,  July 19, 2010 4:34 PM

## 2010-09-14 NOTE — Assessment & Plan Note (Signed)
Summary: 6 WK ROV /NWS #   Vital Signs:  Patient profile:   55 year old male Height:      69 inches Weight:      185 pounds BMI:     27.42 Temp:     97.1 degrees F oral Pulse rate:   64 / minute Pulse rhythm:   regular Resp:     16 per minute BP sitting:   120 / 82  (left arm) Cuff size:   regular  Vitals Entered By: Lanier Prude, CMA(AAMA) (May 25, 2010 7:51 AM) CC: 6 wk f/u Is Patient Diabetic? No   Primary Care Provider:  Tresa Garter MD  CC:  6 wk f/u.  History of Present Illness: F/u depression, stress, arthralgias C/o pain in shoulders  and LBP 5-6/10 daily  Current Medications (verified): 1)  Zegerid 40-1100 Mg Caps (Omeprazole-Sodium Bicarbonate) .Marland Kitchen.. 1 Qd 2)  Flonase 50 Mcg/act Susp (Fluticasone Propionate) .... 2 Spr Qd 3)  Simvastatin 40 Mg  Tabs (Simvastatin) .... Once Daily 4)  Aspir-Low 81 Mg Tbec (Aspirin) .Marland Kitchen.. 1 Once Daily Pc (On Hold) 5)  Vitamin D3 1000 Unit  Tabs (Cholecalciferol) .Marland Kitchen.. 1 Qd 6)  Celebrex 200 Mg Caps (Celecoxib) .Marland Kitchen.. 1 By Mouth Once Daily Pc 7)  Clonazepam 1 Mg Tabs (Clonazepam) .Marland Kitchen.. 1 By Mouth At Bedtime As Needed 8)  Hydrocodone-Acetaminophen 5-325 Mg Tabs (Hydrocodone-Acetaminophen) .Marland Kitchen.. 1 By Mouth Up To 4 Times Per Day As Needed For Pain 9)  Zolpidem Tartrate 10 Mg  Tabs (Zolpidem Tartrate) .... 1/2 or 1 By Mouth At [facility] Prn 10)  Wellbutrin Xl 300 Mg Xr24h-Tab (Bupropion Hcl) .Marland Kitchen.. 1 By Mouth Qd 11)  Skelaxin 800 Mg Tabs (Metaxalone) .Marland Kitchen.. 1 By Mouth Two or Four Times A Day As Needed Back Spasm 12)  Amrix 15 Mg Xr24h-Cap (Cyclobenzaprine Hcl) .Marland Kitchen.. 1 By Mouth Qpm For Spasms in Muscles 13)  Abilify 15 Mg Tabs (Aripiprazole) .Marland Kitchen.. 1 By Mouth Qd  Allergies (verified): 1)  Strattera (Atomoxetine Hcl)  Past History:  Past Medical History: Last updated: 06/04/2008 OSA on CPAP Bipolar disorder  Dr Rolla Plate psychologist, Dr Donell Beers ADD (?) GERD Low back pain Osteoarthritis Shoulder pain B L eye tear duct  infection Hyperlipidemia Benign prostatic hypertrophy  Past Surgical History: Last updated: 01/26/2010 Shoulder surgery L, R Appendectomy Hernia Surgery Prostate surgery 2010 Dr Isabel Caprice  Social History: Last updated: 12/30/2008 Occupation: Runner, broadcasting/film/video; coaching [employer] Married Never Smoked Stopped drinking 2009 Drug use-no  Review of Systems  The patient denies chest pain, dyspnea on exertion, and abdominal pain.    Physical Exam  General:  Well-developed,well-nourished,in no acute distress; alert,appropriate and cooperative throughout examination Nose:  nares are boggy/ mildly congested Mouth:  mild erythema diffusely in throat - no swelling/ exudate or ulcers Lungs:  Normal respiratory effort, chest expands symmetrically. Lungs are clear to auscultation, no crackles or wheezes. Heart:  Normal rate and regular rhythm. S1 and S2 normal without gallop, murmur, click, rub or other extra sounds. Abdomen:  soft and non-tender.   Msk:  no acute or chronic joint changes  B shoulders tender w/ROM Neurologic:  No cranial nerve deficits noted. Station and gait are normal. Plantar reflexes are down-going bilaterally. DTRs are symmetrical throughout. Sensory, motor and coordinative functions appear intact. Skin:  Intact without suspicious lesions or rashes Psych:  Oriented X3, good eye contact, and not suicidal.  not homicidal and depressed affect.     Impression & Recommendations:  Problem # 1:  SHOULDER PAIN (ICD-719.41) Assessment  Deteriorated Hold Simvastatin x 3-4 wks His updated medication list for this problem includes:    Aspir-low 81 Mg Tbec (Aspirin) .Marland Kitchen... 1 once daily pc (on hold)    Celebrex 200 Mg Caps (Celecoxib) .Marland Kitchen... 1 by mouth once daily pc    Hydrocodone-acetaminophen 5-325 Mg Tabs (Hydrocodone-acetaminophen) .Marland Kitchen... 1 by mouth up to 4 times per day as needed for pain    Skelaxin 800 Mg Tabs (Metaxalone) .Marland Kitchen... 1 by mouth two or four times a day as needed back spasm     Amrix 15 Mg Xr24h-cap (Cyclobenzaprine hcl) .Marland Kitchen... 1 by mouth qpm for spasms in muscles    Arthrotec 75-200 Mg-mcg Tabs (Diclofenac-misoprostol) .Marland Kitchen... 1 by mouth once daily-bid pc  Problem # 2:  STRESS DISORDER (ICD-V62.89) Assessment: Unchanged  On the regimen of medicine(s) reflected in the chart    Problem # 3:  HYPERLIPIDEMIA (ICD-272.4) Assessment: Unchanged as per #1 His updated medication list for this problem includes:    Simvastatin 40 Mg Tabs (Simvastatin) ..... Once daily  Problem # 4:  LOW BACK PAIN (ICD-724.2) Assessment: Deteriorated As per #1 His updated medication list for this problem includes:    Aspir-low 81 Mg Tbec (Aspirin) .Marland Kitchen... 1 once daily pc (on hold)    Celebrex 200 Mg Caps (Celecoxib) .Marland Kitchen... 1 by mouth once daily pc    Hydrocodone-acetaminophen 5-325 Mg Tabs (Hydrocodone-acetaminophen) .Marland Kitchen... 1 by mouth up to 4 times per day as needed for pain    Skelaxin 800 Mg Tabs (Metaxalone) .Marland Kitchen... 1 by mouth two or four times a day as needed back spasm    Amrix 15 Mg Xr24h-cap (Cyclobenzaprine hcl) .Marland Kitchen... 1 by mouth qpm for spasms in muscles    Arthrotec 75-200 Mg-mcg Tabs (Diclofenac-misoprostol) .Marland Kitchen... 1 by mouth once daily-bid pc  Problem # 5:  BIPOLAR DEPRESSION (ICD-296.7) Assessment: Improved  Complete Medication List: 1)  Zegerid 40-1100 Mg Caps (Omeprazole-sodium bicarbonate) .Marland Kitchen.. 1 qd 2)  Flonase 50 Mcg/act Susp (Fluticasone propionate) .... 2 spr qd 3)  Simvastatin 40 Mg Tabs (Simvastatin) .... Once daily 4)  Aspir-low 81 Mg Tbec (Aspirin) .Marland Kitchen.. 1 once daily pc (on hold) 5)  Vitamin D3 1000 Unit Tabs (Cholecalciferol) .Marland Kitchen.. 1 qd 6)  Celebrex 200 Mg Caps (Celecoxib) .Marland Kitchen.. 1 by mouth once daily pc 7)  Clonazepam 1 Mg Tabs (Clonazepam) .Marland Kitchen.. 1 by mouth at bedtime as needed 8)  Hydrocodone-acetaminophen 5-325 Mg Tabs (Hydrocodone-acetaminophen) .Marland Kitchen.. 1 by mouth up to 4 times per day as needed for pain 9)  Zolpidem Tartrate 10 Mg Tabs (Zolpidem tartrate) .... 1/2 or 1  by mouth at hs prn 10)  Wellbutrin Xl 300 Mg Xr24h-tab (Bupropion hcl) .Marland Kitchen.. 1 by mouth qd 11)  Skelaxin 800 Mg Tabs (Metaxalone) .Marland Kitchen.. 1 by mouth two or four times a day as needed back spasm 12)  Amrix 15 Mg Xr24h-cap (Cyclobenzaprine hcl) .Marland Kitchen.. 1 by mouth qpm for spasms in muscles 13)  Abilify 15 Mg Tabs (Aripiprazole) .Marland Kitchen.. 1 by mouth qd 14)  Arthrotec 75-200 Mg-mcg Tabs (Diclofenac-misoprostol) .Marland Kitchen.. 1 by mouth once daily-bid pc  Patient Instructions: 1)  Hold Simvastatin 2)  Please schedule a follow-up appointment in 3 months. 3)  BMP prior to visit, ICD-9:272.0 995.20 4)  Hepatic Panel prior to visit, ICD-9: 5)  Lipid Panel prior to visit, ICD-9: 6)  CK Prescriptions: ARTHROTEC 75-200 MG-MCG TABS (DICLOFENAC-MISOPROSTOL) 1 by mouth once daily-bid pc  #180 x 1   Entered and Authorized by:   Tresa Garter MD   Signed by:  Tresa Garter MD on 05/25/2010   Method used:   Print then Give to Patient   RxID:   [id number]

## 2010-09-14 NOTE — Progress Notes (Signed)
Summary: DME  Phone Note Call from Patient Call back at Home Phone [phone]   Caller: Patient Call For: clance Reason for Call: Talk to Nurse Summary of Call: pt has heard nothing re: [facility] setting pt up for New mask possible auto machine.  Please respond asap/  Been waiting over a week and no one has contacted patient. Initial call taken by: Eugene Gavia,  March 22, 2010 1:51 PM  Follow-up for Phone Call        libby this order was sent 03/09/10 and pt still has not heard from company   Philipp Deputy [facility]  March 22, 2010 2:18 PM   Additional Follow-up for Phone Call Additional follow up Details #1::        spoke [email or web address]  in May and she states they never recieved the order-refaxed order to them  Additional Follow-up by: Oneita Jolly,  March 22, 2010 2:37 PM
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
## 2010-09-14 NOTE — Progress Notes (Signed)
Summary: cpap  Phone Note Call from Patient Call back at Home Phone [phone]   Caller: Patient Call For: clance Summary of Call: Pt states he still hasn't heard from [facility] about his cpap, ok to leave info with wife if he isn't home. Initial call taken by: Darletta Moll,  April 01, 2010 3:19 PM  Follow-up for Phone Call        Spoke with PCC's-they will ck on this for the pt.Reynaldo Minium CMA  April 01, 2010 3:50 PM   Additional Follow-up for Phone Call Additional follow up Details #1::        spoke to wife she is aware that [facility] states they got an approval for pt's cpap machine and mask on 03/30/10 and will be giving them a call asap wife also informed if she does not hear back from them to call me back Additional Follow-up by: Oneita Jolly,  April 01, 2010 4:16 PM

## 2010-09-14 NOTE — Assessment & Plan Note (Signed)
Summary: Dysphagia/LRH    History of Present Illness Primary GI MD: Stan Head MD [facility] Primary Provider: Tresa Garter MD Chief Complaint: Increasingly worse solid food dysphagia with meats and breads x 2 weeks.  Pt denies N/V.  History of Present Illness:   55 YO MALE KNOWN TO DR. Leone Payor WITH HX OF COLON POLYPS.LAST COLONOSCOPY 6/10-NOPOLYPS,INTERNAL HEMORRHOIDS FOUND.  HE COMES IN TODAY WITH A NEW PROBLEM. ACUTE ONSET ABOUT 2 WEEKS AGO WITH SOLID FOOD DYSPHAGIA. HE HAS NOT HAD ANY PROBLEMS IN THE PAST. HE STAYS ON OMEPRAZOLE 40 MG DAILY,AND HAS DONE SO FOR YEARS ,BUT DOESN'T REMEMBER HAVING ONGOING HEARTBURN ISSUES.   HE DENIES ANY ODYNOPHAGIA, AND THUS FAR HAS BEEN ABLE TO  GET FOOD TO PASS IF HE DRINKS SOME LIQUIDS . NO REGURGITATION. PRIMARILY  HAVING TROUBLE WITH MEAT AND BREAD,AND SXS OCCURRING DAILY. NO ABDOMINAL PAIN. HE HAS LOST 30 POUNDS OVER THE PAST 4 MONTHS BUT MOST OF THIS HAS BEEN INTENTIONAL AND DUE TO "STRESS". HE TAKES CELEBREX AND IS ALSO ON ARTHROTEC FOR ARTHRALGIAS.   GI Review of Systems    Reports dysphagia with solids.      Denies abdominal pain, acid reflux, belching, bloating, chest pain, dysphagia with liquids, heartburn, loss of appetite, nausea, vomiting, vomiting blood, weight loss, and  weight gain.        Denies anal fissure, black tarry stools, change in bowel habit, constipation, diarrhea, diverticulosis, fecal incontinence, heme positive stool, hemorrhoids, irritable bowel syndrome, jaundice, light color stool, liver problems, rectal bleeding, and  rectal pain.    Current Medications (verified): 1)  Omeprazole 40 Mg Cpdr (Omeprazole) .... One Capsule By Mouth Once Daily 2)  Flonase 50 Mcg/act Susp (Fluticasone Propionate) .... 2 Spr Qd 3)  Vitamin D3 1000 Unit  Tabs (Cholecalciferol) .Marland Kitchen.. 1 Qd 4)  Celebrex 200 Mg Caps (Celecoxib) .Marland Kitchen.. 1 By Mouth Once Daily Pc 5)  Clonazepam 1 Mg Tabs (Clonazepam) .Marland Kitchen.. 1 By Mouth At Bedtime As Needed 6)   Hydrocodone-Acetaminophen 5-325 Mg Tabs (Hydrocodone-Acetaminophen) .Marland Kitchen.. 1 By Mouth Up To 4 Times Per Day As Needed For Pain 7)  Zolpidem Tartrate 10 Mg  Tabs (Zolpidem Tartrate) .... 1/2 or 1 By Mouth At [facility] Pr[facility] 8)  Wellbutrin Xl 300 Mg Xr24h-Tab (Bupropion Hcl) .Marland Kitchen.. 1 By Mouth Qd 9)  Skelaxin 800 Mg Tabs (Metaxalone) .Marland Kitchen.. 1 By Mouth Two or Four Times A Day As Needed Back Spasm 10)  Amrix 15 Mg Xr24h-Cap (Cyclobenzaprine Hcl) .Marland Kitchen.. 1 By Mouth Qpm For Spasms in Muscles 11)  Abilify 15 Mg Tabs (Aripiprazole) .Marland Kitchen.. 1 By Mouth Qd 12)  Arthrotec 75-200 Mg-Mcg Tabs (Diclofenac-Misoprostol) .Marland Kitchen.. 1 By Mouth Once Daily-Bid Pc 13)  Super B Complex  Tabs (B Complex-C) .... One Tablet By Mouth Once Daily  Allergies (verified): 1)  Strattera (Atomoxetine Hcl)  Past History:  Past Medical History: OSA on CPAP Bipolar disorder  Dr Rolla Plate psychologist, Dr Donell Beers ADD (?) GERD Low back pain Osteoarthritis Shoulder pain B L eye tear duct infection Hyperlipidemia Benign prostatic hypertrophy hx of colon polyps  Past Surgical History: Shoulder surgery L, R Appendectomy Hernia Surgery Prostate surgery 2010 Dr Isabel Caprice Colonoscopy 6/10- Leone Payor, no polyps  Family History: Reviewed history from 01/26/2010 and no changes required. Family History Diabetes 1st degree relative Family History of Asthma Family History of CAD Male 1st degree relative <50 F alpha antitrypsin F lung ca  Social History: Reviewed history from 12/30/2008 and no changes required. Occupation: Runner, broadcasting/film/video; Scientist, research (life sciences) Married Never Smoked Stopped drinking  2009 Drug use-no  Review of Systems  The patient denies allergy/sinus, anemia, anxiety-new, arthritis/joint pain, back pain, blood in urine, breast changes/lumps, change in vision, confusion, cough, coughing up blood, depression-new, fainting, fatigue, fever, headaches-new, hearing problems, heart murmur, heart rhythm changes, itching, menstrual pain, muscle  pains/cramps, night sweats, nosebleeds, pregnancy symptoms, shortness of breath, skin rash, sleeping problems, sore throat, swelling of feet/legs, swollen lymph glands, thirst - excessive , urination - excessive , urination changes/pain, urine leakage, vision changes, and voice change.         see hpi  Vital Signs:  Patient profile:   55 year old male Height:      69 inches Weight:      181.38 pounds BMI:     26.88 Pulse rate:   68 / minute Pulse rhythm:   regular BP sitting:   110 / 76  (left arm) Cuff size:   regular  Vitals Entered By: Christie Nottingham CMA [facility]) (July 20, 2010 8:31 AM)  Physical Exam  General:  Well developed, well nourished, no acute distress. Head:  Normocephalic and atraumatic. Eyes:  PERRLA, no icterus. Lungs:  Clear throughout to auscultation. Heart:  Regular rate and rhythm; no murmurs, rubs,  or bruits. Abdomen:  soft, nontender, no mass or hsm,bs+ Rectal:  not done Extremities:  No clubbing, cyanosis, edema or deformities noted. Neurologic:  Alert and  oriented x4;  grossly normal neurologically. Psych:  Alert and cooperative. Normal mood and affect.   Impression & Recommendations:  Problem # 1:  DYSPHAGIA (ICD-787.29) Assessment New  54 YO MALE WITH NEW ONSET SOLID FOOD DYSPHAGIA X 2 WEEKS. R/O PEPTIC STRICTURE,R/O OCCULT LESION.  DISCUSSED SOFT DIET, CHOPPED MEATS ETC. CONTINUE OMEPRAZOLE 40 MG DAILY IN AM SCHEDULE FOR UPPER ENDOSCOPY WITH DR. Leone Payor WITH PROBABLE DILATION. PROCEDURE,RISKS AND BENEFITS DISCUSSED WITH PT. PT  TAKING 2 DIFFERENT NSAIDS, ADVISED TO STOP ONE OF THEM, HE WILL STOP CELEBREX AND CONTINUE ARTHROTEC.  Orders: ZENDO with Sav. Dil (ZENDO/Sav Dil.)  Problem # 2:  PERSONAL HX COLONIC POLYPS (ICD-V12.72) Assessment: Comment Only UP TO DATE, LAST COLON 6/10-NEGATIVE  Problem # 3:  DEPRESSION (ICD-311) Assessment: Comment Only  Problem # 4:  OBSTRUCTIVE SLEEP APNEA (ICD-327.23) Assessment: Comment  Only  Problem # 5:  OSTEOARTHRITIS (ICD-715.90) Assessment: Comment Only  Patient Instructions: 1)  Stay on the Prilosec 40mg  daily. 2)  Stop the Celebrex. 3)  We are schedulingthe Endoscopy w/ dilation in our Endoscopy suite on the 4th floor with Dr. Stan Head. 4)  We will call you with the date. Marland Kitchen  5)  Copy sent to : Dr. Macarthur Critchley Plotnikov 6)  The medication list was reviewed and reconciled.  All changed / newly prescribed medications were explained.  A complete medication list was provided to the patient / caregiver.

## 2010-09-14 NOTE — Letter (Signed)
Summary: [facility]  [facility]   Imported By: Sherian Rein 09/11/2009 13:43:45  _____________________________________________________________________  External Attachment:    Type:   Image     Comment:   External Document

## 2010-09-14 NOTE — Progress Notes (Signed)
Summary: Zocor-allergy clarification  Phone Note From Pharmacy   Caller: [facility] Action Taken: Prescription resent Summary of Call: Rec fax from [facility] requesting clarification on allergy to Zocor.  I spoke to pt...he states he is currently taking simvastatin with no known reactions/allergies. Sent new Rx for med to pharmacy. Is it ok to remove this med from allergy list? Initial call taken by: Lanier Prude, [facility]),  February 10, 2010 2:22 PM  Follow-up for Phone Call        He states he is ok Follow-up by: Tresa Garter MD,  February 11, 2010 8:37 AM    Prescriptions: SIMVASTATIN 40 MG  TABS (SIMVASTATIN) once daily  #90 x 3   Entered by:   Lanier Prude, [facility])   Authorized by:   Tresa Garter MD   Signed by:   Lanier Prude, CMA(AAMA) on 02/10/2010   Method used:   Faxed to ...       [facility] Environmental education officer)       [address] [address]       [place], [place]  [address]       Ph: [phone]       Fax: [phone]   RxID:   [id number]

## 2010-09-14 NOTE — Assessment & Plan Note (Signed)
Summary: rov for osa   Copy to:  Dr. Annalee Genta Primary Provider/Referring Provider:  Tresa Garter MD  CC:  Pt here for follow up on OSA. Pt c/o  full face mask has leak. Pt states is using the [employer]. Pt states has been taking mask off in the middle of the night.  History of Present Illness: The pt comes in today for f/u of his osa.  He has been wearing cpap compliantly, but stil having issues with mask fit and pulling the mask off during the middle of the night.  He continues to believe it is still helping him with regards to restorative sleep and daytime alertness.  He felt he did very well with the auto device when we were optimiziing his pressure, but his insurance has not allowed [place] to get him on that particular machine.  He has tried various masks in the past, but none of the newer models  Preventive Screening-Counseling & Management  Alcohol-Tobacco     Smoking Status: never  Current Medications (verified): 1)  Zegerid 40-1100 Mg Caps (Omeprazole-Sodium Bicarbonate) .Marland Kitchen.. 1 Qd 2)  Flonase 50 Mcg/act Susp (Fluticasone Propionate) .... 2 Spr Qd 3)  Simvastatin 40 Mg  Tabs (Simvastatin) .... Once Daily 4)  Aspir-Low 81 Mg Tbec (Aspirin) .Marland Kitchen.. 1 Once Daily Pc (On Hold) 5)  Vitamin D3 1000 Unit  Tabs (Cholecalciferol) .Marland Kitchen.. 1 Qd 6)  Celebrex 200 Mg Caps (Celecoxib) .Marland Kitchen.. 1 By Mouth Once Daily Pc 7)  Clonazepam 1 Mg Tabs (Clonazepam) .Marland Kitchen.. 1 By Mouth At Bedtime As Needed 8)  Hydrocodone-Acetaminophen 5-325 Mg Tabs (Hydrocodone-Acetaminophen) .Marland Kitchen.. 1 By Mouth Up To 4 Times Per Day As Needed For Pain 9)  Zolpidem Tartrate 10 Mg  Tabs (Zolpidem Tartrate) .... 1/2 or 1 By Mouth At [facility] Prn 10)  Wellbutrin Xl 300 Mg Xr24h-Tab (Bupropion Hcl) .Marland Kitchen.. 1 By Mouth Qd 11)  Skelaxin 800 Mg Tabs (Metaxalone) .Marland Kitchen.. 1 By Mouth Two or Four Times A Day As Needed Back Spasm 12)  Amrix 15 Mg Xr24h-Cap (Cyclobenzaprine Hcl) .Marland Kitchen.. 1 By Mouth Qpm For Spasms in Muscles  Allergies (verified): 1)  Strattera  (Atomoxetine Hcl)  Review of Systems       The patient complains of shortness of breath with activity, acid heartburn, indigestion, nasal congestion/difficulty breathing through nose, anxiety, and depression.  The patient denies shortness of breath at rest, productive cough, non-productive cough, coughing up blood, chest pain, irregular heartbeats, loss of appetite, weight change, abdominal pain, difficulty swallowing, sore throat, tooth/dental problems, headaches, sneezing, itching, ear ache, hand/feet swelling, joint stiffness or pain, rash, change in color of mucus, and fever.    Vital Signs:  Patient profile:   55 year old male Height:      69 inches Weight:      196.8 pounds BMI:     29.17 O2 Sat:      96 % on Room air Temp:     98.4 degrees F oral Pulse rate:   69 / minute BP sitting:   128 / 90  (left arm) Cuff size:   regular  Vitals Entered By: Zackery Barefoot CMA (March 09, 2010 11:53 AM)  O2 Flow:  Room air CC: Pt here for follow up on OSA. Pt c/o  full face mask has leak. Pt states is using the [employer]. Pt states has been taking mask off in the middle of the night Comments Medications reviewed with patient Verified contact number and pharmacy with patient Zackery Barefoot CMA  March 09, 2010 11:54 AM    Physical Exam  General:  wd male in nad Nose:  no skin breakdown or pressure necrosis from the cpap mask Extremities:  no edema or cyanosis Neurologic:  alert, not sleepy, moves all four.   Impression & Recommendations:  Problem # 1:  OBSTRUCTIVE SLEEP APNEA (ICD-327.23) the pt is doing adequately with cpap, but is still having mask fit and pressure issues.  Will have his dme company show him some of the newer masks, and will see if we can work out a way to get him an Forensic psychologist.  At least he is getting benefit from his current setup.   I have also asked him to work on modest weight loss   Medications Added to Medication List This Visit: 1)  Aspir-low 81  Mg Tbec (Aspirin) .Marland Kitchen.. 1 once daily pc (on hold) 2)  Celebrex 200 Mg Caps (Celecoxib) .Marland Kitchen.. 1 by mouth once daily pc  Other Orders: Est. Patient Level III ([address]) DME Referral (DME)  Patient Instructions: 1)  will get your dme to show you different masks to try to see if fit is better. 2)  will look at other alternatives for getting you an auto machine 3)  let me know if not doing well with cpap 4)  followup with me in one year or sooner if having issues.

## 2010-09-14 NOTE — Assessment & Plan Note (Signed)
Summary: follow up to refill rx-lb   Vital Signs:  Patient profile:   55 year old male Height:      69 inches Weight:      189.13 pounds BMI:     28.03 O2 Sat:      97 % on Room air Temp:     98.6 degrees F oral Pulse rate:   62 / minute BP sitting:   104 / 70  (left arm) Cuff size:   regular  O2 Flow:  Room air CC: Follow-up visit/discuss meds and med refill./kb Is Patient Diabetic? No Pain Assessment Patient in pain? no        Primary Care Provider:  Plotnikov  CC:  Follow-up visit/discuss meds and med refill./kb.  History of Present Illness: The patient presents for a wellness examination   Current Medications (verified): 1)  Zegerid 40-1100 Mg Caps (Omeprazole-Sodium Bicarbonate) .Marland Kitchen.. 1 Qd 2)  Flonase 50 Mcg/act Susp (Fluticasone Propionate) .... 2 Spr Qd 3)  Simvastatin 40 Mg  Tabs (Simvastatin) .... Once Daily 4)  Aspir-Low 81 Mg Tbec (Aspirin) .Marland Kitchen.. 1 Once Daily Pc 5)  Vitamin D3 1000 Unit  Tabs (Cholecalciferol) .Marland Kitchen.. 1 Qd 6)  Celebrex 200 Mg Caps (Celecoxib) .Marland Kitchen.. 1 By Mouth Once Daily Pc Prn 7)  Clonazepam 1 Mg Tabs (Clonazepam) .Marland Kitchen.. 1 By Mouth At Bedtime As Needed 8)  Hydrocodone-Acetaminophen 5-325 Mg Tabs (Hydrocodone-Acetaminophen) .Marland Kitchen.. 1 By Mouth Up To 4 Times Per Day As Needed For Pain 9)  Zolpidem Tartrate 10 Mg  Tabs (Zolpidem Tartrate) .... 1/2 or 1 By Mouth At [facility] Prn 10)  Gabapentin 100 Mg Caps (Gabapentin) .Marland Kitchen.. 1 By Mouth Qid Prn 11)  Wellbutrin Xl 300 Mg Xr24h-Tab (Bupropion Hcl) .Marland Kitchen.. 1 By Mouth Qd  Allergies (verified): 1)  Strattera (Atomoxetine Hcl) 2)  Zocor  Past History:  Past Medical History: Last updated: 06/04/2008 OSA on CPAP Bipolar disorder  Dr Rolla Plate psychologist, Dr Donell Beers ADD (?) GERD Low back pain Osteoarthritis Shoulder pain B L eye tear duct infection Hyperlipidemia Benign prostatic hypertrophy  Social History: Last updated: 12/30/2008 Occupation: Runner, broadcasting/film/video; coaching [employer] Married Never Smoked Stopped  drinking 2009 Drug use-no  Past Surgical History: Shoulder surgery L, R Appendectomy Hernia Surgery Prostate surgery 2010 Dr Isabel Caprice  Family History: Family History Diabetes 1st degree relative Family History of Asthma Family History of CAD Male 1st degree relative <50 F alpha antitrypsin F lung ca  Review of Systems  The patient denies anorexia, fever, weight loss, weight gain, vision loss, decreased hearing, hoarseness, chest pain, syncope, dyspnea on exertion, peripheral edema, prolonged cough, headaches, hemoptysis, abdominal pain, melena, hematochezia, severe indigestion/heartburn, hematuria, incontinence, genital sores, muscle weakness, suspicious skin lesions, transient blindness, difficulty walking, depression, unusual weight change, abnormal bleeding, enlarged lymph nodes, angioedema, and testicular masses.         Shoulder pains  Physical Exam  General:  Well-developed,well-nourished,in no acute distress; alert,appropriate and cooperative throughout examination Head:  Normocephalic and atraumatic without obvious abnormalities. No apparent alopecia or balding. Eyes:  No corneal or conjunctival inflammation noted. EOMI. Perrla.  Ears:  R wax removed Nose:  nares are boggy/ mildly congested Mouth:  mild erythema diffusely in throat - no swelling/ exudate or ulcers Neck:  No deformities, masses, or tenderness noted. Lungs:  Normal respiratory effort, chest expands symmetrically. Lungs are clear to auscultation, no crackles or wheezes. Heart:  Normal rate and regular rhythm. S1 and S2 normal without gallop, murmur, click, rub or other extra sounds. Abdomen:  soft and  non-tender.   Rectal:  No external abnormalities noted. Normal sphincter tone. No rectal masses or tenderness. Genitalia:  Testes bilaterally descended without nodularity, tenderness or masses. No scrotal masses or lesions. No penis lesions or urethral discharge. Prostate:  1+ enlarged.   Msk:  no acute joint  changes  Pulses:  R and L carotid,radial,femoral,dorsalis pedis and posterior tibial pulses are full and equal bilaterally Extremities:  No clubbing, cyanosis, edema, or deformity noted with normal full range of motion of all joints.   Neurologic:  No cranial nerve deficits noted. Station and gait are normal. Plantar reflexes are down-going bilaterally. DTRs are symmetrical throughout. Sensory, motor and coordinative functions appear intact. Skin:  Intact without suspicious lesions or rashes Psych:  Oriented X3, good eye contact, and not suicidal.     Impression & Recommendations:  Problem # 1:  PHYSICAL EXAMINATION (ICD-V70.0) Assessment New  Health and age related issues were discussed. Available screening tests and vaccinations were discussed as well. Healthy life style including good diet and execise was discussed. The labs were reviewed with the patient.   Orders: EKG w/ Interpretation (93000)  Problem # 2:  SHOULDER PAIN (ICD-719.41) Assessment: Unchanged  His updated medication list for this problem includes:    Aspir-low 81 Mg Tbec (Aspirin) .Marland Kitchen... 1 once daily pc    Celebrex 200 Mg Caps (Celecoxib) .Marland Kitchen... 1 by mouth once daily pc prn    Hydrocodone-acetaminophen 5-325 Mg Tabs (Hydrocodone-acetaminophen) .Marland Kitchen... 1 by mouth up to 4 times per day as needed for pain    Skelaxin 800 Mg Tabs (Metaxalone) .Marland Kitchen... 1 by mouth two or four times a day as needed back spasm    Amrix 15 Mg Xr24h-cap (Cyclobenzaprine hcl) .Marland Kitchen... 1 by mouth qpm for spasms in muscles  Problem # 3:  BENIGN PROSTATIC HYPERTROPHY (ICD-600.00) Assessment: Improved He had surgery in 2010  Problem # 4:  GERD (ICD-530.81)  His updated medication list for this problem includes:    Zegerid 40-1100 Mg Caps (Omeprazole-sodium bicarbonate) .Marland Kitchen... 1 qd  Complete Medication List: 1)  Zegerid 40-1100 Mg Caps (Omeprazole-sodium bicarbonate) .Marland Kitchen.. 1 qd 2)  Flonase 50 Mcg/act Susp (Fluticasone propionate) .... 2 spr qd 3)   Simvastatin 40 Mg Tabs (Simvastatin) .... Once daily 4)  Aspir-low 81 Mg Tbec (Aspirin) .Marland Kitchen.. 1 once daily pc 5)  Vitamin D3 1000 Unit Tabs (Cholecalciferol) .Marland Kitchen.. 1 qd 6)  Celebrex 200 Mg Caps (Celecoxib) .Marland Kitchen.. 1 by mouth once daily pc prn 7)  Clonazepam 1 Mg Tabs (Clonazepam) .Marland Kitchen.. 1 by mouth at bedtime as needed 8)  Hydrocodone-acetaminophen 5-325 Mg Tabs (Hydrocodone-acetaminophen) .Marland Kitchen.. 1 by mouth up to 4 times per day as needed for pain 9)  Zolpidem Tartrate 10 Mg Tabs (Zolpidem tartrate) .... 1/2 or 1 by mouth at hs prn 10)  Wellbutrin Xl 300 Mg Xr24h-tab (Bupropion hcl) .Marland Kitchen.. 1 by mouth qd 11)  Skelaxin 800 Mg Tabs (Metaxalone) .Marland Kitchen.. 1 by mouth two or four times a day as needed back spasm 12)  Amrix 15 Mg Xr24h-cap (Cyclobenzaprine hcl) .Marland Kitchen.. 1 by mouth qpm for spasms in muscles  Patient Instructions: 1)  Please schedule a follow-up appointment in 6 months. 2)  PSA prior to visit, ICD-9: 3)  BMP prior to visit, ICD-9: 4)  Hepatic Panel prior to visit, ICD-9: 5)  CBC w/ Diff prior to visit, ICD-9: 6)  HbgA1C prior to visit, ICD-9: 790.29  995.20 7)  testosterone 780.79 Prescriptions: AMRIX 15 MG XR24H-CAP (CYCLOBENZAPRINE HCL) 1 by mouth qpm for spasms in  muscles  #90 x 1   Entered and Authorized by:   Tresa Garter MD   Signed by:   Tresa Garter MD on 01/26/2010   Method used:   Print then Give to Patient   RxID:   [id number] SKELAXIN 800 MG TABS (METAXALONE) 1 by mouth two or four times a day as needed back spasm  #360 x 0   Entered and Authorized by:   Tresa Garter MD   Signed by:   Tresa Garter MD on 01/26/2010   Method used:   Print then Give to Patient   RxID:   [id number] WELLBUTRIN XL 300 MG XR24H-TAB (BUPROPION HCL) 1 by mouth qd  #90 x 3   Entered and Authorized by:   Tresa Garter MD   Signed by:   Tresa Garter MD on 01/26/2010   Method used:   Print then Give to Patient   RxID:   [id number] ZOLPIDEM TARTRATE 10 MG   TABS (ZOLPIDEM TARTRATE) 1/2 or 1 by mouth at hs prn  #90 x 1   Entered and Authorized by:   Tresa Garter MD   Signed by:   Tresa Garter MD on 01/26/2010   Method used:   Print then Give to Patient   RxID:   [id number] HYDROCODONE-ACETAMINOPHEN 5-325 MG TABS (HYDROCODONE-ACETAMINOPHEN) 1 by mouth up to 4 times per day as needed for pain  #360 x 1   Entered and Authorized by:   Tresa Garter MD   Signed by:   Tresa Garter MD on 01/26/2010   Method used:   Print then Give to Patient   RxID:   [id number] CLONAZEPAM 1 MG TABS (CLONAZEPAM) 1 by mouth at bedtime as needed  #90 x 1   Entered and Authorized by:   Tresa Garter MD   Signed by:   Tresa Garter MD on 01/26/2010   Method used:   Print then Give to Patient   RxID:   [id number] SIMVASTATIN 40 MG  TABS (SIMVASTATIN) once daily  #90 x 3   Entered and Authorized by:   Tresa Garter MD   Signed by:   Tresa Garter MD on 01/26/2010   Method used:   Print then Give to Patient   RxID:   [id number] FLONASE 50 MCG/ACT SUSP (FLUTICASONE PROPIONATE) 2 spr qd  #3 x 3   Entered and Authorized by:   Tresa Garter MD   Signed by:   Tresa Garter MD on 01/26/2010   Method used:   Print then Give to Patient   RxID:   [id number] ZEGERID 40-1100 MG CAPS (OMEPRAZOLE-SODIUM BICARBONATE) 1 qd  #90 x 3   Entered and Authorized by:   Tresa Garter MD   Signed by:   Tresa Garter MD on 01/26/2010   Method used:   Print then Give to Patient   RxID:   [id number] AMRIX 15 MG XR24H-CAP (CYCLOBENZAPRINE HCL) 1 by mouth qpm for spasms in muscles  #30 x 3   Entered and Authorized by:   Tresa Garter MD   Signed by:   Tresa Garter MD on 01/26/2010   Method used:   Print then Give to Patient   RxID:   [id number] HYDROCODONE-ACETAMINOPHEN 5-325 MG TABS (HYDROCODONE-ACETAMINOPHEN) 1 by mouth up to 4 times per day as needed for pain   #120 x 1   Entered and Authorized by:   Georgina Quint Plotnikov  MD   Signed by:   Tresa Garter MD on 01/26/2010   Method used:   Print then Give to Patient   RxID:   [id number] SKELAXIN 800 MG TABS (METAXALONE) 1 by mouth two or four times a day as needed back spasm  #120 x 3   Entered and Authorized by:   Tresa Garter MD   Signed by:   Tresa Garter MD on 01/26/2010   Method used:   Print then Give to Patient   RxID:   [id number]

## 2010-09-16 NOTE — Letter (Signed)
Summary: CMN for DME / [facility]  CMN for DME / [facility]   Imported By: Lennie Odor 08/31/2010 10:53:31  _____________________________________________________________________  External Attachment:    Type:   Image     Comment:   External Document

## 2010-09-16 NOTE — Assessment & Plan Note (Signed)
Summary: chest cold/sinus infection?/AVP/SS   Vital Signs:  Patient profile:   55 year old male Height:      69 inches (175.26 cm) Weight:      181 pounds (82.27 kg) O2 Sat:      98 % on Room air Temp:     98.3 degrees F (36.83 degrees C) oral Pulse rate:   76 / minute BP sitting:   112 / 82  (left arm) Cuff size:   large  Vitals Entered By: Orlan Leavens RMA (August 30, 2010 2:58 PM)  O2 Flow:  Room air CC: Chest & nasal congestion, URI symptoms Is Patient Diabetic? No Pain Assessment Patient in pain? no        Primary Care Provider:  Georgina Quint Plotnikov MD  CC:  Chest & nasal congestion and URI symptoms.  History of Present Illness:  URI Symptoms      This is a 55 year old man who presents with URI symptoms.  The symptoms began 3 weeks ago.  The severity is described as moderate.  progressive symptoms- .  The patient reports nasal congestion, purulent nasal discharge, productive cough, and earache, but denies clear nasal discharge, sore throat, and sick contacts.  Associated symptoms include low-grade fever (<100.5 degrees).  The patient denies stiff neck, dyspnea, wheezing, rash, vomiting, diarrhea, and use of an antipyretic.  The patient also reports sneezing, headache, muscle aches, and severe fatigue.  The patient denies itchy watery eyes, itchy throat, and seasonal symptoms.  Risk factors for Strep sinusitis include unilateral facial pain and Strep exposure.  The patient denies the following risk factors for Strep sinusitis: tooth pain, tender adenopathy, and absence of cough.    Clinical Review Panels:  CBC   WBC:  4.2 (01/25/2010)   RBC:  4.00 (01/25/2010)   Hgb:  12.2 (01/25/2010)   Hct:  35.5 (01/25/2010)   Platelets:  252.0 (01/25/2010)   MCV  88.6 (01/25/2010)   MCHC  34.3 (01/25/2010)   RDW  15.5 (01/25/2010)   PMN:  57.8 (01/25/2010)   Lymphs:  25.1 (01/25/2010)   Monos:  11.6 (01/25/2010)   Eosinophils:  5.0 (01/25/2010)   Basophil:  0.5  (01/25/2010)  Complete Metabolic Panel   Glucose:  109 (01/25/2010)   Sodium:  142 (01/25/2010)   Potassium:  4.8 (01/25/2010)   Chloride:  108 (01/25/2010)   CO2:  29 (01/25/2010)   BUN:  33 (01/25/2010)   Creatinine:  1.0 (01/25/2010)   Albumin:  3.9 (01/25/2010)   Total Protein:  6.3 (01/25/2010)   Calcium:  9.0 (01/25/2010)   Total Bili:  0.2 (01/25/2010)   Alk Phos:  38 (01/25/2010)   SGPT (ALT):  20 (01/25/2010)   SGOT (AST):  24 (01/25/2010)   Current Medications (verified): 1)  Omeprazole 40 Mg Cpdr (Omeprazole) .... One Capsule By Mouth Once Daily 2)  Flonase 50 Mcg/act Susp (Fluticasone Propionate) .... 2 Spr Qd 3)  Vitamin D3 1000 Unit  Tabs (Cholecalciferol) .Marland Kitchen.. 1 Qd 4)  Celebrex 200 Mg Caps (Celecoxib) .Marland Kitchen.. 1 By Mouth Once Daily Pc 5)  Clonazepam 1 Mg Tabs (Clonazepam) .Marland Kitchen.. 1 By Mouth At Bedtime As Needed 6)  Hydrocodone-Acetaminophen 5-325 Mg Tabs (Hydrocodone-Acetaminophen) .Marland Kitchen.. 1 By Mouth Up To 4 Times Per Day As Needed For Pain 7)  Zolpidem Tartrate 10 Mg  Tabs (Zolpidem Tartrate) .... 1/2 or 1 By Mouth At [facility] Prn 8)  Wellbutrin Xl 300 Mg Xr24h-Tab (Bupropion Hcl) .Marland Kitchen.. 1 By Mouth Qd 9)  Skelaxin 800 Mg  Tabs (Metaxalone) .Marland Kitchen.. 1 By Mouth Two or Four Times A Day As Needed Back Spasm 10)  Amrix 15 Mg Xr24h-Cap (Cyclobenzaprine Hcl) .Marland Kitchen.. 1 By Mouth Qpm For Spasms in Muscles 11)  Abilify 15 Mg Tabs (Aripiprazole) .Marland Kitchen.. 1 By Mouth Qd 12)  Arthrotec 75-200 Mg-Mcg Tabs (Diclofenac-Misoprostol) .Marland Kitchen.. 1 By Mouth Once Daily-Bid Pc 13)  Super B Complex  Tabs (B Complex-C) .... One Tablet By Mouth Once Daily  Allergies (verified): 1)  Strattera (Atomoxetine Hcl)  Past History:  Past Medical History: OSA on CPAP  Bipolar disorder  Dr Rolla Plate psychologist, Dr Donell Beers ADD (?)  GERD Low back pain Osteoarthritis Shoulder pain B L eye tear duct infection Hyperlipidemia Benign prostatic hypertrophy hx of colon polyps  Review of Systems  The patient denies weight loss,  hoarseness, dyspnea on exertion, and hemoptysis.    Physical Exam  General:  Well-developed,well-nourished,in no acute distress; alert,appropriate and cooperative throughout examination Eyes:  vision grossly intact; pupils equal, round and reactive to light.  conjunctiva and lids normal.    Ears:  normal pinnae bilaterally, without erythema, swelling, or tenderness to palpation. TMs clear, without effusion, or cerumen impaction. Hearing grossly normal bilaterally  Nose:  tender to palp over left max sinus and nasal region Mouth:  teeth and gums in good repair; mucous membranes moist, without lesions or ulcers. oropharynx clear without exudate, min erythema. +pnd Lungs:  normal respiratory effort, no intercostal retractions or use of accessory muscles; normal breath sounds bilaterally - no crackles and no wheezes.    Heart:  normal rate, regular rhythm, no murmur, and no rub. BLE without edema.    Impression & Recommendations:  Problem # 1:  ACUTE MAXILLARY SINUSITIS (ICD-461.0)  His updated medication list for this problem includes:    Flonase 50 Mcg/act Susp (Fluticasone propionate) .Marland Kitchen... 2 spr qd    Augmentin 875-125 Mg Tabs (Amoxicillin-pot clavulanate) .Marland Kitchen... 1 by mouth two times a day x 7 days  Instructed on treatment. Call if symptoms persist or worsen.   Orders: Prescription Created Electronically [phone])  Complete Medication List: 1)  Omeprazole 40 Mg Cpdr (Omeprazole) .... One capsule by mouth once daily 2)  Flonase 50 Mcg/act Susp (Fluticasone propionate) .... 2 spr qd 3)  Vitamin D3 1000 Unit Tabs (Cholecalciferol) .Marland Kitchen.. 1 qd 4)  Celebrex 200 Mg Caps (Celecoxib) .Marland Kitchen.. 1 by mouth once daily pc 5)  Clonazepam 1 Mg Tabs (Clonazepam) .Marland Kitchen.. 1 by mouth at bedtime as needed 6)  Hydrocodone-acetaminophen 5-325 Mg Tabs (Hydrocodone-acetaminophen) .Marland Kitchen.. 1 by mouth up to 4 times per day as needed for pain 7)  Zolpidem Tartrate 10 Mg Tabs (Zolpidem tartrate) .... 1/2 or 1 by mouth at hs  prn 8)  Wellbutrin Xl 300 Mg Xr24h-tab (Bupropion hcl) .Marland Kitchen.. 1 by mouth qd 9)  Skelaxin 800 Mg Tabs (Metaxalone) .Marland Kitchen.. 1 by mouth two or four times a day as needed back spasm 10)  Amrix 15 Mg Xr24h-cap (Cyclobenzaprine hcl) .Marland Kitchen.. 1 by mouth qpm for spasms in muscles 11)  Abilify 15 Mg Tabs (Aripiprazole) .Marland Kitchen.. 1 by mouth qd 12)  Arthrotec 75-200 Mg-mcg Tabs (Diclofenac-misoprostol) .Marland Kitchen.. 1 by mouth once daily-bid pc 13)  Super B Complex Tabs (B complex-c) .... One tablet by mouth once daily 14)  Augmentin 875-125 Mg Tabs (Amoxicillin-pot clavulanate) .Marland Kitchen.. 1 by mouth two times a day x 7 days  Patient Instructions: 1)  it was good to see you today. 2)  Augmentin antibiotics for your sinus symptoms - use with nasal spray as  discussed - your prescriptions have been electronically submitted to your pharmacy. Please take as directed. Contact our office if you believe you're having problems with the medication(s).  3)  Get plenty of rest, drink lots of clear liquids, and use Tylenol or Ibuprofen for fever and comfort. Return in 7-10 days if you're not better:sooner if you're feeling worse. Prescriptions: AUGMENTIN 875-125 MG TABS (AMOXICILLIN-POT CLAVULANATE) 1 by mouth two times a day x 7 days  #14 x 0   Entered and Authorized by:   Newt Lukes MD   Signed by:   Newt Lukes MD on 08/30/2010   Method used:   Electronically to        [id number] (retail)       [address]       [place], [place]  [address]       Ph: [phone] or [phone]       Fax: [phone]   RxID:   [id number]    Orders Added: 1)  Est. Patient Level IV [27253] 2)  Prescription Created Electronically [phone]

## 2010-10-04 ENCOUNTER — Encounter: Payer: Self-pay | Admitting: Internal Medicine

## 2010-10-04 ENCOUNTER — Telehealth (INDEPENDENT_AMBULATORY_CARE_PROVIDER_SITE_OTHER): Payer: Self-pay | Admitting: *Deleted

## 2010-10-04 ENCOUNTER — Ambulatory Visit (INDEPENDENT_AMBULATORY_CARE_PROVIDER_SITE_OTHER): Admitting: Internal Medicine

## 2010-10-04 DIAGNOSIS — F4323 Adjustment disorder with mixed anxiety and depressed mood: Secondary | ICD-10-CM | POA: Insufficient documentation

## 2010-10-04 DIAGNOSIS — Z733 Stress, not elsewhere classified: Secondary | ICD-10-CM

## 2010-10-04 DIAGNOSIS — F329 Major depressive disorder, single episode, unspecified: Secondary | ICD-10-CM

## 2010-10-04 DIAGNOSIS — F3289 Other specified depressive episodes: Secondary | ICD-10-CM

## 2010-10-04 DIAGNOSIS — F411 Generalized anxiety disorder: Secondary | ICD-10-CM

## 2010-10-05 ENCOUNTER — Telehealth: Payer: Self-pay | Admitting: Internal Medicine

## 2010-10-12 NOTE — Progress Notes (Addendum)
Summary:  cpap questions--lmomtcb x4  Phone Note Call from Patient Call back at Home Phone [phone]   Caller: Patient Call For: clance Reason for Call: Talk to Nurse Summary of Call: Patient wanting to know if we could get a reading from his cpap machine.  He states he has been waking up very tired and we have done this in past for him. Initial call taken by: Lehman Prom,  October 04, 2010 12:04 PM  Follow-up for Phone Call        Called, spoke with pt's wife, [name].  States pt is not home right now but will have him return call when he returns.  Gweneth Dimitri RN  October 04, 2010 1:26 PM  pt returned call from triage. pt is at home #. Tivis Ringer, CNA  October 04, 2010 1:30 PM  Called, spoke with pt.  States he is taking mask of in the middle of the night at times and waking up tired x 1 month.  He has lost some weight -- currently at 172lbs.  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  States d/t these reasons, "appt with be tough."  Will forward message to [facility] for his recs.  Thanks! Follow-up by: Gweneth Dimitri RN,  October 04, 2010 2:05 PM  Additional Follow-up for Phone Call Additional follow up Details #1::        has he kept up with mask changes, is it leaking a lot, how old is machine.  We can recheck his pressure since he lost weight, but first address the above questions. Additional Follow-up by: Barbaraann Share MD,  October 04, 2010 4:31 PM    Additional Follow-up for Phone Call Additional follow up Details #2::    [email or web address].Michel Bickers [facility]  October 04, 2010 4:35 PM lmomtcb x 2 Carver Fila  October 06, 2010 4:56 PM  lmomtcb x3 Carver Fila  October 07, 2010 5:05 PM  LMOMTCB x 4 and will sign msg per protocol    Appended Document:  cpap questions--lmomtcb x4 The patient states his cpap machine is approx 4 - 4 1/57 yrs old. He has been getting a new mask every 90 days and says he hasn't really noticed any leaks. Pt  aware Dr. Shelle Iron is out until Mon., 10/11/2010  and we will call him back first of the week with his recs.  Appended Document:  cpap questions--lmomtcb x4 let him know that we will recheck his pressure on auto mode to make sure is set right.  A lot of this may be due to life stress...don't know.  Let's check pressure and see how he does.  Will send an order to [facility]  Appended Document:  cpap questions--lmomtcb x4 lmomtcb x1  Appended Document:  cpap questions--lmomtcb x4 [email or web address].  Appended Document:  cpap questions--lmomtcb x4 pt called back.  informed him of KC's recs.  Pt states he still hasn't heard from [facility] regarding getting set up on a auto x 2 week.  Will send phone note to [facility] to check on this.

## 2010-10-12 NOTE — Assessment & Plan Note (Signed)
Summary: very stressed, needs meds adjusted--ok dr Macario Golds   Vital Signs:  Patient profile:   55 year old male Height:      69 inches Weight:      183 pounds BMI:     27.12 Temp:     99.1 degrees F oral Pulse rate:   76 / minute Pulse rhythm:   regular Resp:     16 per minute BP sitting:   130 / 80  (left arm) Cuff size:   regular  Vitals Entered By: Lanier Prude, CMA(AAMA) (October 04, 2010 5:07 PM) CC: extremely stressed, not sleeping well, fatigue Comments pt is not taking celebrex or Amrix   Primary Care Provider:  Tresa Garter MD  CC:  extremely stressed, not sleeping well, and fatigue.  History of Present Illness: C/o severe stress; depression and anxiety, insomnia. C/o  stress at home - dad with terminal Ca in [place] and at work. He has been feeling very bad.  Current Medications (verified): 1)  Omeprazole 40 Mg Cpdr (Omeprazole) .... One Capsule By Mouth Once Daily 2)  Flonase 50 Mcg/act Susp (Fluticasone Propionate) .... 2 Spr Once Daily 3)  Vitamin D3 1000 Unit  Tabs (Cholecalciferol) .Marland Kitchen.. 1 By Mouth Once Daily 4)  Celebrex 200 Mg Caps (Celecoxib) .Marland Kitchen.. 1 By Mouth Once Daily Pc 5)  Clonazepam 1 Mg Tabs (Clonazepam) .Marland Kitchen.. 1 By Mouth At Bedtime As Needed(1/2 At Bedtime) 6)  Hydrocodone-Acetaminophen 5-325 Mg Tabs (Hydrocodone-Acetaminophen) .Marland Kitchen.. 1 By Mouth Up To 4 Times Per Day As Needed For Pain 7)  Zolpidem Tartrate 10 Mg  Tabs (Zolpidem Tartrate) .... 1/2 or 1 By Mouth At Bedtime As Needed 8)  Wellbutrin Xl 300 Mg Xr24h-Tab (Bupropion Hcl) .Marland Kitchen.. 1 By Mouth Qd 9)  Skelaxin 800 Mg Tabs (Metaxalone) .Marland Kitchen.. 1 By Mouth Two or Four Times A Day As Needed Back Spasm 10)  Amrix 15 Mg Xr24h-Cap (Cyclobenzaprine Hcl) .Marland Kitchen.. 1 By Mouth Qpm For Spasms in Muscles 11)  Abilify 15 Mg Tabs (Aripiprazole) .Marland Kitchen.. 1 By Mouth Once Daily in Morning 12)  Arthrotec 75-200 Mg-Mcg Tabs (Diclofenac-Misoprostol) .Marland Kitchen.. 1 By Mouth Once Daily-Bid Pc 13)  Super B Complex  Tabs (B Complex-C) .... One  Tablet By Mouth Once Daily  Allergies (verified): 1)  Strattera (Atomoxetine Hcl)  Past History:  Past Medical History: Last updated: 08/30/2010 OSA on CPAP  Bipolar disorder  Dr Rolla Plate psychologist, Dr Donell Beers ADD (?)  GERD Low back pain Osteoarthritis Shoulder pain B L eye tear duct infection Hyperlipidemia Benign prostatic hypertrophy hx of colon polyps  Past Surgical History: Last updated: 07/20/2010 Shoulder surgery L, R Appendectomy Hernia Surgery Prostate surgery 2010 Dr Isabel Caprice Colonoscopy 6/10- Leone Payor, no polyps  Social History: Last updated: 12/30/2008 Occupation: Runner, broadcasting/film/video; coaching [employer] Married Never Smoked Stopped drinking 2009 Drug use-no  Review of Systems       The patient complains of depression.  The patient denies weight loss, weight gain, and abdominal pain.         tired and sleepy  Physical Exam  General:  Well-developed,well-nourished,in no acute distress; alert,appropriate and cooperative throughout examination Eyes:  vision grossly intact; pupils equal, round and reactive to light.  conjunctiva and lids normal.    Nose:  tender to palp over left max sinus and nasal region Mouth:  teeth and gums in good repair; mucous membranes moist, without lesions or ulcers. oropharynx clear without exudate, min erythema. +pnd Neck:  No deformities, masses, or tenderness noted. Lungs:  normal respiratory effort, no intercostal  retractions or use of accessory muscles; normal breath sounds bilaterally - no crackles and no wheezes.    Heart:  normal rate, regular rhythm, no murmur, and no rub. BLE without edema.  Abdomen:  soft and non-tender.   Msk:  no acute or chronic joint changes  B shoulders tender w/ROM Extremities:  No clubbing, cyanosis, edema, or deformity noted with normal full range of motion of all joints.   Neurologic:  No cranial nerve deficits noted. Station and gait are normal. Plantar reflexes are down-going bilaterally. DTRs are  symmetrical throughout. Sensory, motor and coordinative functions appear intact. Skin:  dry skin on hands Psych:  Oriented X3, good eye contact, and not suicidal.  not homicidal and depressed affect.  subdued.     Impression & Recommendations:  Problem # 1:  STRESS DISORDER (ICD-V62.89) Assessment Deteriorated Discussed option. Declined "sick leave" See "Patient Instructions".   Orders: Psychology Referral (Psychology)  Problem # 2:  DEPRESSION (ICD-311) situational Assessment: Deteriorated  Problem # 3:  BIPOLAR DEPRESSION (ICD-296.7) Assessment: Deteriorated On the regimen of medicine(s) reflected in the chart   Orders: Psychology Referral (Psychology)  Problem # 4:  ANXIETY STATE, UNSPECIFIED (ICD-300.00) Assessment: Deteriorated  His updated medication list for this problem includes:    Clonazepam 1 Mg Tabs (Clonazepam) .Marland Kitchen... 1/2 by mouth in am and 1/2 at lunch and 1/2 or 1  at bedtime as needed    Wellbutrin Xl 300 Mg Xr24h-tab (Bupropion hcl) .Marland Kitchen... 1 by mouth in am  Orders: Psychology Referral (Psychology)  Complete Medication List: 1)  Omeprazole 40 Mg Cpdr (Omeprazole) .... One capsule by mouth once daily 2)  Flonase 50 Mcg/act Susp (Fluticasone propionate) .... 2 spr once daily 3)  Vitamin D3 1000 Unit Tabs (Cholecalciferol) .Marland Kitchen.. 1 by mouth once daily 4)  Celebrex 200 Mg Caps (Celecoxib) .Marland Kitchen.. 1 by mouth once daily pc 5)  Clonazepam 1 Mg Tabs (Clonazepam) .... 1/2 by mouth in am and 1/2 at lunch and 1/2 or 1  at bedtime as needed 6)  Hydrocodone-acetaminophen 5-325 Mg Tabs (Hydrocodone-acetaminophen) .Marland Kitchen.. 1 by mouth up to 4 times per day as needed for pain 7)  Zolpidem Tartrate 10 Mg Tabs (Zolpidem tartrate) .... 1/2 or 1 by mouth at bedtime as needed 8)  Wellbutrin Xl 300 Mg Xr24h-tab (Bupropion hcl) .Marland Kitchen.. 1 by mouth in am 9)  Skelaxin 800 Mg Tabs (Metaxalone) .Marland Kitchen.. 1 by mouth two or four times a day as needed back spasm 10)  Amrix 15 Mg Xr24h-cap (Cyclobenzaprine  hcl) .Marland Kitchen.. 1 by mouth qpm for spasms in muscles 11)  Abilify 15 Mg Tabs (Aripiprazole) .Marland Kitchen.. 1 by mouth once daily at hs 12)  Arthrotec 75-200 Mg-mcg Tabs (Diclofenac-misoprostol) .Marland Kitchen.. 1 by mouth once daily-bid pc 13)  Super B Complex Tabs (B complex-c) .... One tablet by mouth once daily  Patient Instructions: 1)  Use stretching exercises that I have provided (15 min. or longer every day)  2)  Please schedule a follow-up appointment in 6 weeks. 3)  Call if you are not better in a reasonable amount of time or if worse.  Prescriptions: ARTHROTEC 75-200 MG-MCG TABS (DICLOFENAC-MISOPROSTOL) 1 by mouth once daily-bid pc  #180 x 1   Entered and Authorized by:   Tresa Garter MD   Signed by:   Tresa Garter MD on 10/04/2010   Method used:   Print then Give to Patient   RxID:   [id number] CLONAZEPAM 1 MG TABS (CLONAZEPAM) 1/2 by mouth in am and 1/2  at lunch and 1/2 or 1  at bedtime as needed  #180 x 1   Entered and Authorized by:   Tresa Garter MD   Signed by:   Tresa Garter MD on 10/04/2010   Method used:   Print then Give to Patient   RxID:   [id number]    Orders Added: 1)  Psychology Referral [Psychology] 2)  Est. Patient Level IV [86578]

## 2010-10-12 NOTE — Progress Notes (Signed)
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  Thanks Initial call taken by: Dagoberto Reef,  October 05, 2010 1:54 PM  Follow-up for Phone Call        Noted Thank you!  Follow-up by: Tresa Garter MD,  October 05, 2010 9:27 PM

## 2010-10-12 NOTE — Progress Notes (Signed)
  Phone Note Call from Patient Call back at Home Phone [phone]   Caller: [phone] Call For: Dr Posey Rea Summary of Call: Pt under tremendous stress, pt wants anti-depressent, ok per MD to work in end of day.  Initial call taken by: Verdell Face,  October 04, 2010 11:25 AM

## 2010-10-13 ENCOUNTER — Encounter: Payer: Self-pay | Admitting: Pulmonary Disease

## 2010-10-21 NOTE — Miscellaneous (Signed)
Summary: Orders Update  Clinical Lists Changes  Orders: Added new Referral order of DME Referral (DME) - Signed 

## 2010-10-25 ENCOUNTER — Telehealth (INDEPENDENT_AMBULATORY_CARE_PROVIDER_SITE_OTHER): Payer: Self-pay | Admitting: *Deleted

## 2010-11-02 NOTE — Progress Notes (Signed)
Summary: order for auto download  Phone Note Call from Patient Call back at [phone] ext [phone]   Caller: Patient Call For: clance Summary of Call: [facility] ordered for pt to have an auto x 2 weeks with download back in Feb.  Pt states he still hasn't heard from [facility] regarding getting this set up.  Will forward message to PCCs to check on the status of this order. Marland KitchenArman Filter LPN  October 25, 2010 9:13 AM  Initial call taken by: Arman Filter LPN,  October 25, 2010 9:13 AM  Follow-up for Phone Call        refaxed the order [facility] in [place] per pt he has always worked with chris Follow-up by: Oneita Jolly,  October 25, 2010 10:33 AM

## 2010-11-23 ENCOUNTER — Ambulatory Visit: Admitting: Psychology

## 2010-11-30 LAB — HEMOGLOBIN AND HEMATOCRIT, BLOOD
HCT: 39.6 % (ref 39.0–52.0)
Hemoglobin: 13 g/dL (ref 13.0–17.0)

## 2010-12-28 NOTE — Op Note (Signed)
NAME:  [name] [name], [name] [name]                  ACCOUNT NO.:  [id number]   MEDICAL RECORD NO.:  [id number]          PATIENT TYPE:  AMB   LOCATION:  DAY                          FACILITY:  [facility]   PHYSICIAN:  Valetta Fuller, M.D.  DATE OF BIRTH:  10/02/1955   DATE OF PROCEDURE:  10/01/2008  DATE OF DISCHARGE:                               OPERATIVE REPORT   PREOPERATIVE DIAGNOSIS:  Benign prostatic hypertrophy.   POSTOPERATIVE DIAGNOSIS:  Benign prostatic hypertrophy.   OPERATION PERFORMED:  Transurethral incision of the prostate.   SURGEON:  Valetta Fuller, M.D.   ASSISTANT:  Duane Boston, M.D.   INDICATIONS:  This is a 55 year old gentleman with a history of BPH and  urodynamically proven outflow obstruction.  On cystoscopy he was found  to have a significant median bar.  After continued failure with medical  therapy the patient elected to proceed with transurethral incision of  the prostate.  Risks and benefits of the procedure were discussed with  the patient in detail preoperatively.   FINDINGS:  1. BPH and a large median bar.  2. Uncomplicated transurethral incision of the prostate with good flow      defect.   OPERATION IN DETAIL:  The patient was brought back to the operating room  and after the successful induction of laryngeal mask anesthetic he was  placed in the dorsal lithotomy position, and all pressure points were  padded appropriately.  He received preoperative antibiotics and a  preoperative time-out was performed.   Using a 22-French sheath, and 30- and 70-degree lenses pan  cystourethroscopy was performed.  The patient's urethra appeared normal,  except for BPH seen with a significant median bar.  No other anomalies  of his urethra were noted.  On entering the patient's bladder both  ureteral orifices were seen and the bilateral aspects of the trigone  effluxing clear urine.  No bladder anomalies were noted, specifically no  evidence of bladder diverticulum, stone,  foreign body or tumor.   At this point dilation was completed of the urethra to a 30-French using  Mayo sounds.  Then using the resectoscope sheath this was inserted into  the patient's bladder under direct vision.  The bladder was drained and  using a Collins knife transurethral incision of the prostate was  completed at the 6 o'clock position from the bladder neck to the  verumontanum down to the prostatic capsule.  The bladder neck was  further incised at the 5 and 7 o'clock positions.  All bleeding was  fulgurated with a Collins knife.   The bladder was drained and the procedure was ended.  Please note Dr.  Barron Alvine was present throughout the entirety of the case.  At the  end of the case a 20 French 30-mL two-way Foley catheter was placed.   ESTIMATED BLOOD LOSS:  The estimated blood loss was minimal.   URINE OUTPUT:  The urine output was unrecorded.   DRAINS:  Foley catheter.   SPECIMENS:  None.   DISPOSITION:  The patient was taken to the PACU for further care.  Delman Kitten, MD      Valetta Fuller, M.D.  Electronically Signed    DW/MEDQ  D:  10/01/2008  T:  10/01/2008  Job:  [id number]

## 2010-12-28 NOTE — Procedures (Signed)
NAME:  [name] [name], [name] [name]                  ACCOUNT NO.:  [id number]   MEDICAL RECORD NO.:  [id number]          PATIENT TYPE:  OUT   LOCATION:  SLEEP CENTER                 FACILITY:  [facility]   PHYSICIAN:  Clinton D. Maple Hudson, MD, FCCP, FACPDATE OF BIRTH:  06/10[birth year]1954   DATE OF STUDY:  03/20/2007                            NOCTURNAL POLYSOMNOGRAM   REFERRING PHYSICIAN:  Onalee Hua L. Annalee Genta, M.D.   INDICATIONS FOR PROCEDURE:  Hypersomnia with sleep apnea.   RESULTS:  Epward sleepiness score 13/24, BMI 29.8, weight 202 pounds.   MEDICATIONS:  Home medications are listed and reviewed.   A previous diagnostic study on November 06, 2006 recorded an AHI of 13.6  per hour.   SLEEP ARCHITECTURE:  Total sleep time 403 minutes with sleep efficiency  93%. Stage 1 was 6%; Stage 2, 75%; Stage 3, absent. REM 19% of total  sleep time. Sleep latency 12 minutes, REM latency 69 minutes. Awake  after sleep onset, 30 minutes. Arousal index 8. Clonazepam, Ambien, and  hydrocodone were taken prior to arrival at the sleep center.   RESPIRATORY DATA:  Split study protocol. Apnea/hypopnea index (AHI, RDI)  86.4 obstructive events per hour, indicating severe obstructive sleep  apnea/hypopnea syndrome before CPAP. There were 11 obstructive apnea's  and 177 hypopnea's before CPAP. All events were recorded while supine.  REM AHI 17.6. CPAP was titrated to 14 CWP, AHI zero per hour. A medium  ResMed Mirage Quattro full-face mask was used with heated humidifier.   OXYGEN DATA:  Moderate to loud snoring with oxygen desaturation to a  nadir of 81%. After CPAP control, saturation held 97% on room air.   CARDIAC DATA:  Normal sinus rhythm.   MOVEMENT/PARASOMNIA:  Occasional limb jerk with arousal, insignificant.   IMPRESSION/RECOMMENDATION:  1. Severe obstructive sleep apnea/hypopnea syndrome, AHI 86.4 per hour      with positional events all recorded while supine. Moderate to loud      snoring with oxygen desaturation to a  nadir of 81%.  2. Successful CPAP titration to 14 CWP, AHI zero per hour. A medium      ResMed Mirage Quattro full-fact mask was chosen with heated      humidifier.      Clinton D. Maple Hudson, MD, [facility], FACP  Diplomate, Biomedical engineer of Sleep Medicine  Electronically Signed     CDY/MEDQ  D:  03/24/2007 11:15:59  T:  03/25/2007 11:30:43  Job:  [id number]

## 2010-12-28 NOTE — Assessment & Plan Note (Signed)
[facility]                             PULMONARY OFFICE NOTE   NAME:[name] [name], [name] [name]                         MRN:          [id number]  DATE:01/01/2007                            DOB:          1956/06/13    HISTORY OF PRESENT ILLNESS:  The patient is a 55 year old gentleman who  I have been asked to see for sleep apnea.  The patient was diagnosed in  March of this year with mild obstructive sleep apnea with a respiratory  disturbance index of 14 events per hour and O2 desaturation as low as  88%.  The patient, however, had very delayed sleep onset and never  achieved slow wave sleep and had decreased REM.  The patient states he  has been told that he has progressive snoring as well as pauses in his  breathing during sleep.  He typically gets to bed between 9 and 10 p.m.  and gets up at 6:30 to start his day.  The patient is not rested upon  arising even if he is able to sleep longer.  He awakens at least 5-6  times per night for unknown reasons.  The patient works as a Runner, broadcasting/film/video and  has significant excessive daytime sleepiness with periods of inactivity  during the day.  He falls asleep with TV and movies, and is not  satisfied with his degree of alertness.  He has some sleep pressure with  driving but no frank sleepiness.  Of note, his weight is up about 20  pounds over the last 2 years.   PAST MEDICAL HISTORY:  1. Obstructive sleep apnea.  2. Status post appendectomy.  3. History of multiple orthopedic procedures.   CURRENT MEDICATIONS:  1. Zegerid 40 mg daily.  2. Zyprexa 5 mg daily.  3. Clonazepam 1 mg daily.  4. Flonase 2 sprays nightly.   The patient has no known drug allergies.   SOCIAL HISTORY:  He is married and has children.  He has never smoked.   FAMILY HISTORY:  Remarkable for his mother having asthma, father having  heart disease.   REVIEW OF SYSTEMS:  As per history of present illness, also see patient  intake form documented on  the chart.   PHYSICAL EXAMINATION:  IN GENERAL:  He is an overweight male in no acute  distress.  Blood pressure is 120/72, pulse 62, temperature 98.5, weight is 206  pounds, he is 5 feet 9 inches tall.  O2 saturation on room air is 98%.  HEENT:  Pupils are equal, round, and reactive to light and  accommodation.  Extraocular muscles are intact.  Nares show mild septal  deviation with turbinate hypertrophy.  Oropharynx is fairly clear with  very little abnormality of his palate.  His uvula is elongated.  NECK:  Supple without JVD or lymphadenopathy.  There is no palpable  thyromegaly.  CHEST:  Totally clear.  CARDIAC EXAM:  Reveals regular rate and rhythm, no murmurs, rubs, or  gallops.  ABDOMEN:  Soft, nontender, with good bowel sounds.  GENITAL/RECTAL/BREAST EXAM:  Not done and not indicated.  LOWER  EXTREMITIES:  Show trace edema on the right involving the ankle.  Pulses are intact distally.  NEUROLOGICALLY:  Alert and oriented with no obvious motor deficits.   IMPRESSION:  Mild-to-moderate obstructive sleep apnea by nocturnal  polysomnography.  I suspect the patient's degree of sleep apnea is worse  than this at this point in time, given his very delayed sleep onset and  lack of REM and slow wave sleep.  The patient is clearly very  symptomatic with sleep disruption, as well as decreased quality of life  during the day.  He has told me that he is getting ready to have nasal  septal reconstruction and turbinate reduction with Dr. Kelli Churn.  I have  had a long discussion with him about sleep apnea, including the  pathophysiology.  I have also discussed other treatment options with him  including oral appliance, as well as continuous positive airway  pressure.  At this point in time, the patient wishes to proceed with his  nasal surgery in order to improve his nasal airway, and then we can  consider other treatment options if he continues to be symptomatic.  I  have also encouraged  him to work aggressively on weight loss, that will  make a big difference in his sleep as well.   PLAN:  The patient is scheduled to have nasal septal reconstruction and  turbinate reduction, and will contact me when he has gotten through the  recovery.  The patient will work on weight loss in the interim.  If he  continues to have significant quality of life impact from this, even  after his surgery, he is to contact me so that we can consider oral  appliance versus trial of continuous positive airway pressure.     Barbaraann Share, MD,FCCP  Electronically Signed    [facility]/MedQ  DD: 01/02/2007  DT: 01/02/2007  Job #: [id number]   cc:   Matthias Hughs, M.D.

## 2010-12-28 NOTE — Assessment & Plan Note (Signed)
[facility]                            CARDIOLOGY OFFICE NOTE   NAME:[name], [name] CURRENT                         MRN:          [id number]  DATE:09/25/2007                            DOB:          Jun 03, 1956    PRIMARY CARE PHYSICIAN:  Georgina Quint. Plotnikov, MD.   REASON FOR PRESENTATION:  Evaluate patient with chest pain.   HISTORY OF PRESENT ILLNESS:  The patient is a pleasant 55 year old  gentleman who I saw nine years ago.  At that time, he had a strong  family history and had  a stress perfusion study which demonstrated some  chest discomfort.  There was no perfusion defect.  He had some mild ST-  segment changes.  This was interpreted as a low-risk scan and managed  him medically.  He had no further cardiac workup since that time.   The patient now presents because of chest discomfort.  This has been  going on for a couple of months.  It is under his sternum.  It happens a  couple of times per week.  It is a 3/10 heaviness.  It happens at rest.  He is not particularly active but he cannot bring it on with activity  such as climbing stairs or taking the garbage cans out to the curb.  It  is not positional.  Does not happen with respiration.  It goes away on  its own after several hours sometimes.  There is no radiation to his jaw  or to his arms.  There is no associated nausea, vomiting, diaphoresis or  other symptoms.   PAST MEDICAL HISTORY:  1. Dyslipidemia (direct LDL is 147.  I am not sure what his HDL or      triglycerides are).  2. Sleep apnea.,  3. 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   PAST SURGICAL HISTORY:  1. Right ganglion cyst resected.  2. Right shoulder surgery.  3. Appendectomy.  4. Ulnar nerve surgery.  5. Bilateral sinus surgeries.   ALLERGIES:  None.   MEDICATIONS:  1. Zegerid 40 mg daily.  2. Flonase.  3. Wellbutrin XL 50 mg daily.  4. Zyprexa 1.25 mg daily.  5. B12.  6. Vitamin D.  7. Deplin 7.5 mg  daily.  8. Aspirin 81 mg daily.  9. CPAP.   SOCIAL HISTORY:  The patient is married.  He has one 59-year-old child.  He is retired from the [employer] as an Art gallery manager.  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  He does not smoke cigarettes.  Occasionally  drinks alcohol.   FAMILY HISTORY:  Contributory for his father having bypass at age 77.  Negative for other first degree relatives.   REVIEW OF SYSTEMS:  As stated in the HPI, positive for history of ulcer  in 1985, occasional reflux.  Negative for other systems.   PHYSICAL EXAMINATION:  GENERAL APPEARANCE:  The patient is in no  distress.  VITAL SIGNS:  Blood pressure 130/90, heart rate 71 and regular, weight  213 pounds, body mass index 31.  HEENT:  Eyelids unremarkable, pupils equal,  round and reactive.  Fundi  not visualized.  Oral mucosa normal.  NECK:  No jugular distention at 45 degrees.  Carotid upstroke brisk and  symmetric.  No bruits, no thyromegaly.  LYMPHATICS:  No cervical, axillary or inguinal adenopathy.  LUNGS:  Clear to auscultation bilaterally.  BACK:  No costovertebral angle tenderness.  CHEST:  Normal.  HEART:  PMI not displaced or sustained, S1 and S2 within normal limits,  no S3, no S4, no clicks, rubs or murmurs.  ABDOMEN:  Mildly obese,  positive bowel sounds, normal in frequency and pitch, no bruits,  rebound, guarding or midline pulsatile mass.  No hepatomegaly, no  splenomegaly.  SKIN:  No rashes, no nodules.  EXTREMITIES:  2+ pulses throughout, no edema, cyanosis or clubbing.  NEUROLOGIC:  Oriented to person, place and  time.  Cranial nerves II-XII  grossly intact, motor grossly intact throughout.   EKG:  Sinus rhythm, rate 71, axis within normal limits, intervals within  normal limits, no acute ST-T wave changes.   ASSESSMENT/PLAN:  1. Chest discomfort.  The patient's chest discomfort has some atypical      and some typical features.  He has very strong family history of      early coronary  disease.  He has significant dyslipidemia.  Given      this, the pretest probability of obstructive coronary disease is at      least moderate.  Therefore, imaging with stress perfusion study is      indicated.  He will have an exercise Myoview.  2. Dyslipidemia.  We had a long discussion about this.  He needs to be      on a Statin and will probably need combination therapy as his wife      thinks his HDL was in the 20s.  I will try to retrieve these      results.  I have ordered simvastatin 40 mg daily.  He thinks he had      some muscle aches or joint pains with this in the past but is      willing to try this again.  If this recurs, I would try another      drug, given the dyslipidemia and family history.  We discussed the      fact that this is slightly out of line and more aggressive than      current recommendations.  He understands the risk-benefit ratio and      agrees to proceed with medical management.  3. Depression/anxiety.  We talked about this and I encouraged further      follow-up and perhaps referral to Dr. Dellia Cloud.  4. Sleep apnea:  He has this treated with CPAP.  5. Follow-up.  I will see the patient back as needed based on the      results of the above.     Rollene Rotunda, MD, [facility]  Electronically Signed    JH/MedQ  DD: 09/25/2007  DT: 09/27/2007  Job #: [id number]   cc:   Georgina Quint. Plotnikov, MD

## 2010-12-31 NOTE — Procedures (Signed)
NAME:  [name] [name], [name] [name]                  ACCOUNT NO.:  [id number]   MEDICAL RECORD NO.:  [id number]          PATIENT TYPE:  OUT   LOCATION:  SLEEP CENTER                 FACILITY:  [facility]   PHYSICIAN:  Clinton D. Maple Hudson, MD, FCCP, FACPDATE OF BIRTH:  July 21, 1954   DATE OF STUDY:  11/06/2006                            NOCTURNAL POLYSOMNOGRAM   INDICATION FOR STUDY:  Hypersomnia with sleep apnea.   EPWORTH SLEEPINESS SCORE:  11/24.  BMI 28.2.  Weight 191 lb.   MEDICATIONS:  Listed and reviewed.  The list includes:  1. Wellbutrin.  2. Zyprexa.  3. P.r.n. use of Alprazolam.   SLEEP ARCHITECTURE:  Short total sleep time 237 minutes with sleep  efficiency 59%.  Stage 1 was 7%, stage 72%, stages 3 and 4 absent.  REM  21% of total sleep time.  Sleep latency 85 minutes.  REM latency 98  minutes.  Awake after sleep onset 71 minutes.  Arousal index 8.8.  No  bedtime medication was taken.   RESPIRATORY DATA:  Apnea/hypopnea index (AHI, RDI) 13.6 obstructive  events per hour indicating mild obstructive sleep apnea/hypopnea  syndrome.  There were 36 obstructive apneas and 18 hypopneas.  Events  were positional occurring primarily while supine.  REM AHI 11.8.  There  was insufficient early sleep (sustained sleep onset 1 a.m.) and  insufficient events to permit use of split protocol titration on this  study night.   OXYGEN DATA:  Moderate snoring with oxygen desaturation to a nadir of  88%.  Mean oxygen saturation through the study was 96% on room air.   CARDIAC DATA:  Sinus rhythm with occasional PACs.   MOVEMENT-PARASOMNIA:  Limb jerks were noted with rare effect on sleep,  insignificant.  Bathroom x1.   IMPRESSIONS-RECOMMENDATIONS:  1. Short total sleep time reflecting delayed sleep onset with      sustained sleep not noted until after 1 a.m.  2. Mild obstructive sleep apnea/hypopnea syndrome, AHI 13.6 per hours      with most events while sleeping supine.  Moderate snoring with      oxygen  desaturation to a nadir of 88%.  3. CPAP titration would be considered a secondary choice for scores in      this range.  Consider alternatives      including conservative measures such as weight loss and sleep off      of flat of back.  Return for CPAP titration if first measures are      insufficient.      Clinton D. Maple Hudson, MD, [facility], FACP  Diplomate, Biomedical engineer of Sleep Medicine  Electronically Signed     CDY/MEDQ  D:  11/12/2006 09:34:09  T:  11/12/2006 10:27:41  Job:  [id number]

## 2010-12-31 NOTE — Op Note (Signed)
NAME:  [name], [name]                  ACCOUNT NO.:  [id number]   MEDICAL RECORD NO.:  [id number]          PATIENT TYPE:  AMB   LOCATION:  DSC                          FACILITY:  [facility]   PHYSICIAN:  Katy Fitch. Sypher, M.D. DATE OF BIRTH:  1956-03-09   DATE OF PROCEDURE:  03/10/2006  DATE OF DISCHARGE:                                 OPERATIVE REPORT   PREOPERATIVE DIAGNOSIS:  McGowan stage III ulnar neuropathy, left cubital  tunnel.   POSTOPERATIVE DIAGNOSIS:  McGowan stage III ulnar neuropathy, left cubital  tunnel.   OPERATION:  1. Decompression of left ulnar nerve through cubital tunnel with      identification of anconeus epitrochlear muscle.  2. Decompression of left ulnar nerve through cubital tunnel with release      of brachial fascia excision of the anconeus epitrochlear muscle and      release of flexor carpi ulnaris fascia with neural lysis of ulnar      nerve.  3. Also partial resection of medial aponeurosis of triceps.   OPERATING SURGEON:  Lovey Newcomer, M.D.   ASSISTANT:  Molly Maduro Dasnoit PA-C.   ANESTHESIA:  General by LMA.   SUPERVISING ANESTHESIOLOGIST:  Burna Forts, M.D.   INDICATIONS:  [name] [name] is a 55 year old right-hand dominant gentleman who  has been a patient well acquainted with our practice for many years.   He is referred for followup evaluation of his left arm due to a numbness in  his ring and small fingers.   [name] [name] had recently gone to the gym and was working out vigorously  strengthening his upper extremities.  He subsequently developed nocturnal  numbness in his ring and small fingers of the left hand followed by weakness  of pinch and grasp.   [name] [name] was noted to have minor discomfort with cervical motion but no sign of  serious degenerative disk disease.  He had no sign of radiculopathy.  He did  have a positive Tinel sign in his left cubital tunnel, weakness of his  profundus muscles, flexor carpi ulnaris and intrinsic muscles.  Electrodiagnostic studies completed by Dr. Johna Roles on March 01, 2006,  documented a severe decrease in his conduction velocity across the cubital  tunnel with only 35 meters per second conduction through the cubital tunnel.   We advised [name] [name] to proceed with exploration of his ulnar nerve with  decompression at this time.   Preoperatively, he was advised we could not project the length of time  required for recovery.  He will either recover rather promptly via the  metabolic neurapraxia or may require up to 16 months of time if he has an  axilla neuropathy.   After informed consent he is brought to the operating room at this time.   DESCRIPTION OF PROCEDURE:  [name] [name] is brought to the operating room  and placed in the supine position on the operating table.   Following the induction of general anesthesia by LMA technique, the left arm  was prepped with Betadine soap and solution and sterilely draped.  Following  exsanguination of the left arm  with an Esmarch bandage, the arterial  tourniquet was inflated to 120 mmHg.  The procedure commenced with a short  incision directly over the path of the ulnar nerve posterior to the  epicondyle medially.   The subcutaneous tissues were carefully divided taking care to identify the  and electrocauterize multiple transverse veins.   The ulnar nerve was obscured by a large anconeus epitrochlear muscle.  There  was a very tight fascial band at the proximal margin of the anconeus  epitrochlear os.   The ulnar nerve was identified more proximally in the distal brachium by  extending the incision proximally 15 more mm.   The nerve was then decompressed through the cubital tunnel with resection of  the posterior third of the anconeus epitrochlear muscle.  The fascia of the  flexor carpi ulnaris was released.  The muscle fibers of that flexor carpi  ulnaris were teased apart and several fibrous bands and vessels over the  nerve were released  and electrocauterized.   The nerve was decompressed from a distance of 6 cm proximal to the  epicondyle to approximately 6 cm distally.  The nerve was stable through  range of motion 0 to 104 degrees of motion.   The triceps aponeurosis was rather large and did push the nerve forehead  with elbow flexion beyond 85 degrees.  Therefore a 1 cm wide strip of the  triceps aponeurosis was resected across the cubital tunnel to the level of  the muscle tendon junction proximally.   Bleeding points were cauterized with bipolar current followed by release of  the tourniquet.  Further hemostasis was achieved with bipolar cautery  followed by repair of the skin with subdermal sutures of 3-0 Vicryl and  intradermal 3-0 Prolene with Steri-Strips.  There no apparent complications.   [name] [name] tolerated the surgery and anesthesia well.  She was transferred  to the recovery room with stable vital signs.      Katy Fitch Sypher, M.D.  Electronically Signed     RVS/MEDQ  D:  03/10/2006  T:  03/10/2006  Job:  [id number]

## 2010-12-31 NOTE — Op Note (Signed)
NAME:  CARDTadan, Shill                            ACCOUNT NO.:  [id number]   MEDICAL RECORD NO.:  [id number]                   PATIENT TYPE:  AMB   LOCATION:  DSC                                  FACILITY:  [facility]   PHYSICIAN:  Katy Fitch. Naaman Plummer., M.D.          DATE OF BIRTH:  1956-08-02   DATE OF PROCEDURE:  08/21/2003  DATE OF DISCHARGE:                                 OPERATIVE REPORT   PREOPERATIVE DIAGNOSIS:  1. 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  2. Chronic stage II impingement left shoulder.  3. 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   POSTOPERATIVE DIAGNOSIS:  1. 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  2. Chronic stage II impingement left shoulder.  3. 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   1. 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  There     was no sign of instability of the anterior middle glenohumeral ligament     or anterior inferior glenohumeral ligament.  2. Significant subacromial impingement with chronic bursitis, type 2     acromion, and prominent distal clavicle.  3. Deep surface degenerative tear of infraspinatus and minor degeneration of     supraspinatus.   PROCEDURE:  1. Diagnostic arthroscopy of left glenohumeral joint with extensive     debridement of anterior labrum including Buford Complex with degeneration     and anterior middle glenohumeral ligament insertion with  demonstration of     stability and lack of a Bankart type lesion.  2. Debridement of full-thickness glenohumeral chondromalacia and incidental     debridement of superior and posterior labrum with incidental debridement     of deep surface infraspinatus and supraspinatus rotator cuff tear.  3. Subacromial decompression with bursectomy, coracoacromial ligament     release and resection and bursectomy followed by acromioplasty leveling     the acromion to a type I morphology.  4. Open left distal clavicle resection and repair of deltoid and trapezius     muscle, ie, Mumford procedure.   SURGEON:  Katy Fitch. Sypher, M.D.   ASSISTANT:  [name], P.A.   ANESTHESIA:  General endotracheal.  Supervising anesthesiologist is Janetta Hora. Gelene Mink, M.D.   INDICATIONS FOR PROCEDURE:  The patient is a well-known patient with our  practice who is status post decompression of his right shoulder  approximately four years  prior.  He had progressive pain in his left  shoulder and sought an upper extremity orthopedic consult.  Clinical  examination revealed signs of AC arthropathy, impingement, and plane films  suggested early degenerative change at the shoulder.  An MRI was obtained  which documented a large inferior anterior paralabral cyst associated with  degenerative changes in the anterior labrum and chondromalacia of the  glenoid.  He also was noted to have significant AC arthropathy and deep  surface tearing of his rotator cuff and signs of chronic bursitis.   We made arrangements for diagnostic arthroscopy at this time including  examination of the left shoulder under anesthesia followed by a subacromial  decompression, appropriate debridement, and distal clavicle resection.   After informed consent, he is brought to the operating room at this time.   DESCRIPTION OF PROCEDURE:  The patient is brought to the operating room and  placed in the supine position on the operating table.   Following scalene  block in the holding area, anesthesia was nearly complete in the left  forequarter.   General orotracheal anesthesia was induced under the supervision of Charles  E. Frederick, M.D. followed by careful positioning in the beach chair with  torso and head holder designed for shoulder arthroscopy.   The entire left forequarter and upper extremity were prepped with Duraprep  and draped with impervious arthroscopy drapes.   The left shoulder was distended with 20 mL of sterile saline followed by  placement of the arthroscope with blunt technique through a standard  posterior portal.  Diagnostic arthroscopy revealed intact hilar articular  cartilage surfaces on the superior, posterior, and inferior glenoid and a  full-thickness chondromalacia lesion between 3 and 4 o'clock anteriorly  extending 1/3 the width of the glenoid.  There was a Buford complex with an  atypical anterior superior glenohumeral ligament.  The anterior middle and  anterior inferior glenohumeral ligaments were intact, however, their  attachments to the labrum were severely degenerative and fibrillated.   An anterior portal was created under direct vision followed by use of a  nerve hook to palpate the insertion of the anterior middle glenohumeral  ligament.  This was noted to be sound without signs of a Bankart type  lesion.   Given the degenerative nature of this predicament, simple debridement  appeared to be an appropriate choice.   A 4.5 mm suction shaver was used to debride the areas of chondromalacia,  degenerative labrum, and redundant labrum superiorly, inferiorly, as well as  posteriorly.   The deep surface of the rotator cuff was inspected and minor degenerative  tearing of the infraspinatus and supraspinatus were debrided to a smooth  margin.  There was no sign of rotator cuff tear.  The biceps anchor was stable.  The biceps was normal to the rotator interval.  The inferior  recesses  were inspected and found to be without debris and the minor  synovitis of the superior recess was debrided.   There was no sign of chronic instability and it appeared that the anterior  labral degeneration was probably age-related more than any other etiology.   The scope was removed from the glenohumeral joint and placed in the  subacromial space.  After a limited bursectomy, the anatomy of the anterior  acromion and coracoacromial arch was identified and found to be rather  prominent.  The [facility] joint was prominent.   The capsule of the [facility] joint was taken down with the radiofrequency cautery,  followed by removal  of the arthroscopic equipment and resection of the  distal clavicle with a 2 cm incision exposing the distal 2 cm of clavicle  with subperiosteal dissection.   Blunt retractors were placed followed by use of an oscillating saw to remove  the distal 2 cm of clavicle.   The meniscus was degenerative and the distal clavicle had marked  chondromalacia.  The anterior deltoid and trapezius muscles were repaired  with mattress sutures of #2 Fiberwire with knots buried deep followed by  repair of the skin with subdermal suture of 3-0 Vicryl and intradermal 3-0  Prolene and Steri-Strips.   The arthroscope was placed and a thorough bursectomy accomplished followed  by partial resection of the coracoacromial ligament and photographic  documentation of the intact rotator cuff.  The acromion was leveled to a  type I morphology with the suction shaver.  Photographic documentation of  completed decompression was accomplished followed by removal of the  arthroscopic equipment.  There were no apparent complications.   The patient tolerated the surgery and anesthesia well.  Due to the pain he  will likely experience with an open distal clavicle resection, we will keep  him overnight for 24-hour observation.  He will be discharged on the morning  of August 22, 2003, with prescriptions for  Keflex 500 mg one p.o. q.8h as a  prophylactic antibiotic x4 days, Motrin 600 mg one p.o. q.6h p.r.n. pain  with food 30 tablets without refill, also Dilaudid 2 mg one or two p.o. q.4-  6h p.r.n. pain 30 tablets without refill.                                               Katy Fitch Naaman Plummer., M.D.    RVS/MEDQ  D:  08/21/2003  T:  08/21/2003  Job:  [id number]

## 2010-12-31 NOTE — Op Note (Signed)
NAME:  [name] [name], [name] [name]                  ACCOUNT NO.:  [id number]   MEDICAL RECORD NO.:  [id number]          PATIENT TYPE:  AMB   LOCATION:  SDS                          FACILITY:  [facility]   PHYSICIAN:  Elford L. Annalee Genta, M.D.DATE OF BIRTH:  11/30/1955   DATE OF PROCEDURE:  01/04/2007  DATE OF DISCHARGE:                               OPERATIVE REPORT   PREOPERATIVE DIAGNOSES:  1. Deviated nasal septum.  2. Inferior turbinate hypertrophy.  3. Obstructive sleep apnea.   POSTOPERATIVE DIAGNOSES:  1. Deviated nasal septum.  2. Inferior turbinate hypertrophy.  3. Obstructive sleep apnea.   INDICATIONS FOR PROCEDURE:  1. Deviated nasal septum.  2. Inferior turbinate hypertrophy.  3. Obstructive sleep apnea.   SURGICAL PROCEDURE:  1. Nasal septoplasty.  2. Inferior turbinate reduction.   ANESTHESIA:  General endotracheal.   SURGEON:  Kinnie Scales. Annalee Genta, M.D.   COMPLICATIONS:  None.   ESTIMATED BLOOD LOSS:  Less than 50 mL.  No complications.  The patient  was transferred from the operating room to the recovery room in stable  condition.   BRIEF HISTORY:  Mr. Mccombie is a 55 year old white male who was referred  for evaluation of nasal airway obstruction and mild obstructive sleep  apnea.  Preoperative sleep study showed a respiratory index of 15 events  per hour.  An O2 nadir to the high 80s.  He was titrated on CPAP but  unfortunately was unable to tolerate CPAP because of nasal airway  obstruction and congestion.  Given his history and physical examination,  I recommended that he undertake nasal surgery to improve nasal airflow  and CPAP compliance.  The risks, benefits and possible complications of  septoplasty and turbinate reduction were discussed in detail with the  patient who understood and concurred with our plan for surgery, which  was scheduled as above.   PROCEDURE:  The patient was brought to the operating room at [facility] on Jan 04, 2007 and placed in the  supine position on the  operating table.  General endotracheal anesthesia was established  without difficulty.  When the patient was adequate anesthetized, his  nasal cavity was injected with a total of 8 mL of 1% Lidocaine,  1/100,000 solution of epinephrine injected in a subcutaneous fashion  along the nasal septum and inferior turbinates bilaterally.  The  patient's nose was then packed with Afrin-soaked cotton pledgets, which  were left in place for approximately 10 minutes to allow for  vasoconstriction and hemostasis.  The patient was prepped and draped and  positioned on the operating table.  The surgical procedure was begun by  creating a right anterior __________  incision, which was carried  through the mucosa and underlying submucosa.  Mucoperiosteal flap was  elevated from anterior to posterior along the patient's right hand side.  Bony cartilaginous junction was crossed in the midline and the  mucoperiosteal flap was elevated along the patient's left hand side.  Deviated bone and cartilage from the mid and posterior aspects of the  nasal septum were then resected.  The patient had a very large  bony  septal spur, which was mobilized with a 4 mm osteotome.  Mid septal  cartilage was removed, morselized and returned to the __________  pocket  at the conclusion of the surgical procedure and the nasal septal flaps  were reapproximated with 4-0 gut suture on a __________  and horizontal  mattressing fashion.  Bilateral dual nasal septal splints were placed  after the application of Bactroban ointment and were sutured into  position with a 3-0 Ethilon suture.   The patient's inferior turbinates were then treated with inferior  turbinate reduction with cautery set at 12 watts using bipolar  intramural cautery, two submucosal passes were made in each inferior  turbinate.  When the turbinates had been adequately cauterized, their  __________  created a more patent nasal cavity.   Anterior incisions were  created in each inferior turbinate.  A small amount of turbinate bone  was resected, preserving the overlying mucosa and a more patent nasal  cavity.  The patient's nasal cavity and __________  were irrigated and  suctioned.  The orogastric was passed, contents were aspirated.  The  patient was awakened from his anesthetic.  He was extubated and  transferred from the operating room to the recovery room in stable  condition.  No complications.  Blood loss less than 50 mL.           ______________________________  Kinnie Scales. Annalee Genta, M.D.     DLS/MEDQ  D:  04/54/0981  T:  01/04/2007  Job:  [id number]

## 2011-01-20 ENCOUNTER — Telehealth: Payer: Self-pay | Admitting: Pulmonary Disease

## 2011-01-20 NOTE — Telephone Encounter (Signed)
Called [facility] [phone] and spoke with Tresa Endo. Tresa Endo stated that this patient was handled out of the [employer]. She would call and let me know what the status of this order is. Waiting on her return call.

## 2011-01-20 NOTE — Telephone Encounter (Signed)
Called and spoke with pt.  Pt states he has been waiting on [facility] to come and set put up with an auto since end of February!! Order was placed in centricity on 2/29 for 2 week auto download.  Pt states he has called [facility] multiple times and keeps getting the run around and they will hang up on him.  Pt is requesting our help on this.  Also, pt is currently on nasal pillows and would like an order to go back to a full face mask with the "double cushion."  Will forward this message to both [facility] and [facility] so they are aware of this.

## 2011-01-21 NOTE — Telephone Encounter (Signed)
[name] [name] stated that they have been trying to arrange for [name] [name] to purchase a gently used cpap auto per [name] [name]'s request and they just haven't received one in yet. That [name] [name] the Production designer, theatre/television/film of [facility] has spoke with [name] [name] several times about this. I advised her that this doesn't have anything to do with the order that was placed on 10/13/10. The order was for Auto CPAP x 2 wks with download faxed to [name] [name]. She stated that she would speak to [place] about this and check in to this. I called [name] [name] back yesterday evening and informed him of what [facility] has stated. [name] [name] stated that around 8 months ago he was speaking with someone about getting a newer cpap machine at a reduce price. However, that is not the issue at this point. [name] [name] stated that he will be eligible for a new cpap in about a year and he is fine with keeping what he has until then. [name] [name] stated that he has not ever spoke with [name] [name]. He stated that he has left multiple messages, however, he never calls him back.  Called today, and spoke with [name] [name] (RT) at [facility] in [place] and advised him of the above. Unsure of what the problem is, however, it shouldn't have taken 3 months to get [name] [name] an auto cpap loaner for 2 wk download. [name] [name] that this order needed to be addressed with patient today and arranged or we will refer to another dme as [name] [name] has suggested. I called patient and lmoam of this and asked him to contact me today by 4:00 pm.

## 2011-01-21 NOTE — Telephone Encounter (Signed)
[name] [name], please keep in close contact with pt since he is very frustrated by all of this.  I would consider changing dme's if he is not happy.

## 2011-01-24 NOTE — Telephone Encounter (Signed)
Pt called and stated that Thayer Ohm with [facility] contacted him and stated that he should have the auto cpap loaner within 24 hours. Called and left message with pt's wife, for him to call me when he gets machine. Wife stated that she would give pt this message. Rhonda J Cobb

## 2011-01-27 NOTE — Telephone Encounter (Signed)
Libby spoke with Thayer Ohm today with [facility]. Pt will be set up on auto today. Apparently instead of providing a s9/escape auto as a loaner to pt and doing the auto titration study, [facility] stated that they b/c the patient had a s8 escape device. Thayer Ohm stated that pt's humidifier on s8 device wouldn't fit on another cpap. Instead of just taking a loaner out there in March and doing the 2 wk download, [facility] had to locate an s8/escape auto that would fit with pt's humidifier.

## 2011-02-28 ENCOUNTER — Ambulatory Visit (INDEPENDENT_AMBULATORY_CARE_PROVIDER_SITE_OTHER): Admitting: Psychology

## 2011-02-28 DIAGNOSIS — F319 Bipolar disorder, unspecified: Secondary | ICD-10-CM

## 2011-03-10 ENCOUNTER — Ambulatory Visit: Admitting: Psychology

## 2011-03-17 ENCOUNTER — Ambulatory Visit (INDEPENDENT_AMBULATORY_CARE_PROVIDER_SITE_OTHER): Admitting: Psychology

## 2011-03-17 DIAGNOSIS — F319 Bipolar disorder, unspecified: Secondary | ICD-10-CM

## 2011-03-24 ENCOUNTER — Ambulatory Visit (INDEPENDENT_AMBULATORY_CARE_PROVIDER_SITE_OTHER): Admitting: Psychology

## 2011-03-24 DIAGNOSIS — F319 Bipolar disorder, unspecified: Secondary | ICD-10-CM

## 2011-03-31 ENCOUNTER — Ambulatory Visit (INDEPENDENT_AMBULATORY_CARE_PROVIDER_SITE_OTHER): Admitting: Psychology

## 2011-03-31 DIAGNOSIS — F319 Bipolar disorder, unspecified: Secondary | ICD-10-CM

## 2011-04-19 ENCOUNTER — Ambulatory Visit (INDEPENDENT_AMBULATORY_CARE_PROVIDER_SITE_OTHER): Admitting: Psychology

## 2011-04-19 DIAGNOSIS — F319 Bipolar disorder, unspecified: Secondary | ICD-10-CM

## 2011-04-23 ENCOUNTER — Other Ambulatory Visit: Payer: Self-pay | Admitting: Pulmonary Disease

## 2011-04-23 DIAGNOSIS — G4733 Obstructive sleep apnea (adult) (pediatric): Secondary | ICD-10-CM

## 2011-04-25 ENCOUNTER — Ambulatory Visit: Admitting: Psychology

## 2011-05-03 ENCOUNTER — Telehealth: Payer: Self-pay | Admitting: Pulmonary Disease

## 2011-05-03 NOTE — Telephone Encounter (Signed)
LMTCBx1 with the pt spouse.  Carron Curie, CMA

## 2011-05-04 NOTE — Telephone Encounter (Signed)
I spoke with the pt's spouse and gave her the name and number of the person trying to reach the pt from [facility]. The spouse says they will contact [facility] to see what is needed regarding the cpap.

## 2011-06-17 ENCOUNTER — Encounter: Payer: Self-pay | Admitting: Internal Medicine

## 2011-06-20 ENCOUNTER — Ambulatory Visit: Admitting: Internal Medicine

## 2011-06-21 ENCOUNTER — Ambulatory Visit (INDEPENDENT_AMBULATORY_CARE_PROVIDER_SITE_OTHER): Admitting: *Deleted

## 2011-06-21 DIAGNOSIS — Z23 Encounter for immunization: Secondary | ICD-10-CM

## 2011-06-26 ENCOUNTER — Other Ambulatory Visit: Payer: Self-pay | Admitting: Internal Medicine

## 2011-06-29 ENCOUNTER — Ambulatory Visit (INDEPENDENT_AMBULATORY_CARE_PROVIDER_SITE_OTHER): Admitting: Psychology

## 2011-06-29 DIAGNOSIS — F319 Bipolar disorder, unspecified: Secondary | ICD-10-CM

## 2011-07-19 ENCOUNTER — Ambulatory Visit (INDEPENDENT_AMBULATORY_CARE_PROVIDER_SITE_OTHER): Admitting: Psychology

## 2011-07-19 DIAGNOSIS — F319 Bipolar disorder, unspecified: Secondary | ICD-10-CM

## 2011-07-21 ENCOUNTER — Ambulatory Visit: Admitting: Psychology

## 2011-07-26 ENCOUNTER — Ambulatory Visit: Admitting: Psychology

## 2011-07-29 ENCOUNTER — Ambulatory Visit: Admitting: Psychology

## 2011-08-03 ENCOUNTER — Ambulatory Visit (INDEPENDENT_AMBULATORY_CARE_PROVIDER_SITE_OTHER): Admitting: Psychology

## 2011-08-03 DIAGNOSIS — F319 Bipolar disorder, unspecified: Secondary | ICD-10-CM

## 2011-08-10 ENCOUNTER — Encounter: Payer: Self-pay | Admitting: Internal Medicine

## 2011-08-10 ENCOUNTER — Ambulatory Visit (INDEPENDENT_AMBULATORY_CARE_PROVIDER_SITE_OTHER): Admitting: Internal Medicine

## 2011-08-10 DIAGNOSIS — M545 Low back pain, unspecified: Secondary | ICD-10-CM

## 2011-08-10 DIAGNOSIS — B351 Tinea unguium: Secondary | ICD-10-CM | POA: Insufficient documentation

## 2011-08-10 DIAGNOSIS — M25519 Pain in unspecified shoulder: Secondary | ICD-10-CM

## 2011-08-10 MED ORDER — METAXALONE 800 MG PO TABS: 800.0000 mg | ORAL_TABLET | Freq: Two times a day (BID) | ORAL | Status: DC | PRN

## 2011-08-10 MED ORDER — FLUTICASONE PROPIONATE 50 MCG/ACT NA SUSP: 2.0000 | Freq: Every day | NASAL | Status: DC

## 2011-08-10 MED ORDER — CICLOPIROX 8 % EX SOLN: Freq: Every day | CUTANEOUS | Status: DC

## 2011-08-10 MED ORDER — DICLOFENAC-MISOPROSTOL 75-0.2 MG PO TABS: 1.0000 | ORAL_TABLET | Freq: Every day | ORAL | Status: DC

## 2011-08-10 MED ORDER — OMEPRAZOLE 40 MG PO CPDR: 40.0000 mg | DELAYED_RELEASE_CAPSULE | Freq: Every day | ORAL | Status: DC

## 2011-08-10 MED ORDER — HYDROCODONE-ACETAMINOPHEN 5-325 MG PO TABS: 1.0000 | ORAL_TABLET | Freq: Four times a day (QID) | ORAL | Status: DC | PRN

## 2011-08-10 NOTE — Assessment & Plan Note (Signed)
Continue with current prescription therapy as reflected on the Med list.  

## 2011-08-10 NOTE — Assessment & Plan Note (Signed)
L>>R Chronic   Potential benefits of a long term opioids use as well as potential risks (i.e. addiction risk, apnea etc) and complications (i.e. Somnolence, constipation and others) were explained to the patient and were aknowledged.  L arthrosc surg 1/13 pending

## 2011-08-10 NOTE — Progress Notes (Signed)
  Subjective:    Patient ID: [name] [name], male    DOB: 1956/05/19, 55 y.o.   MRN: [id number]  HPI  The patient is here to follow up on chronic B [place] pain, depression, anxiety, headaches and chronic moderate fibromyalgia symptoms controlled with medicines, diet and exercise.    Review of Systems  Constitutional: Positive for fatigue. Negative for appetite change and unexpected weight change.  HENT: Negative for nosebleeds, congestion, sore throat, sneezing, trouble swallowing and neck pain.   Eyes: Negative for itching and visual disturbance.  Respiratory: Negative for cough.   Cardiovascular: Negative for chest pain, palpitations and leg swelling.  Gastrointestinal: Negative for nausea, diarrhea, blood in stool and abdominal distention.  Genitourinary: Negative for frequency and hematuria.  Musculoskeletal: Negative for back pain, joint swelling and gait problem.  Skin: Negative for rash.  Neurological: Negative for dizziness, tremors, speech difficulty and weakness.  Psychiatric/Behavioral: Positive for sleep disturbance. Negative for suicidal ideas, dysphoric mood and agitation. The patient is nervous/anxious.        Objective:   Physical Exam  Constitutional: He is oriented to person, place, and time. He appears well-developed.  HENT:  Mouth/Throat: Oropharynx is clear and moist.  Eyes: Conjunctivae are normal. Pupils are equal, round, and reactive to light.  Neck: Normal range of motion. No JVD present. No thyromegaly present.  Cardiovascular: Normal rate, regular rhythm, normal heart sounds and intact distal pulses.  Exam reveals no gallop and no friction rub.   No murmur heard. Pulmonary/Chest: Effort normal and breath sounds normal. No respiratory distress. He has no wheezes. He has no rales. He exhibits no tenderness.  Abdominal: Soft. Bowel sounds are normal. He exhibits no distension and no mass. There is no tenderness. There is no rebound and no guarding.    Musculoskeletal: Normal range of motion. He exhibits tenderness (shoulders are tender B L>>R). He exhibits no edema.  Lymphadenopathy:    He has no cervical adenopathy.  Neurological: He is alert and oriented to person, place, and time. He has normal reflexes. No cranial nerve deficit. He exhibits normal muscle tone. Coordination normal.  Skin: Skin is warm and dry. No rash noted.  Psychiatric: He has a normal mood and affect. His behavior is normal. Judgment and thought content normal.  R gr toe w/onycho        Assessment & Plan:

## 2011-08-10 NOTE — Assessment & Plan Note (Signed)
[place]  Pod cons declined

## 2011-08-11 ENCOUNTER — Encounter (HOSPITAL_BASED_OUTPATIENT_CLINIC_OR_DEPARTMENT_OTHER): Payer: Self-pay | Admitting: *Deleted

## 2011-08-11 ENCOUNTER — Other Ambulatory Visit: Payer: Self-pay | Admitting: Orthopedic Surgery

## 2011-08-12 ENCOUNTER — Ambulatory Visit (INDEPENDENT_AMBULATORY_CARE_PROVIDER_SITE_OTHER): Admitting: Psychology

## 2011-08-12 DIAGNOSIS — F319 Bipolar disorder, unspecified: Secondary | ICD-10-CM

## 2011-08-16 HISTORY — PX: OTHER SURGICAL HISTORY: SHX169

## 2011-08-18 ENCOUNTER — Encounter (HOSPITAL_BASED_OUTPATIENT_CLINIC_OR_DEPARTMENT_OTHER)
Admission: RE | Disposition: A | Discharge status: Home / Self Care | Payer: Self-pay | Source: Ambulatory Visit | Attending: Orthopedic Surgery

## 2011-08-18 ENCOUNTER — Ambulatory Visit (HOSPITAL_BASED_OUTPATIENT_CLINIC_OR_DEPARTMENT_OTHER)
Admission: RE | Admit: 2011-08-18 | Discharge: 2011-08-19 | Disposition: A | Discharge status: Home / Self Care | Source: Ambulatory Visit | Attending: Orthopedic Surgery | Admitting: Orthopedic Surgery

## 2011-08-18 ENCOUNTER — Encounter (HOSPITAL_BASED_OUTPATIENT_CLINIC_OR_DEPARTMENT_OTHER): Payer: Self-pay | Admitting: Anesthesiology

## 2011-08-18 ENCOUNTER — Ambulatory Visit (HOSPITAL_BASED_OUTPATIENT_CLINIC_OR_DEPARTMENT_OTHER): Admitting: Anesthesiology

## 2011-08-18 ENCOUNTER — Encounter (HOSPITAL_BASED_OUTPATIENT_CLINIC_OR_DEPARTMENT_OTHER): Payer: Self-pay | Admitting: *Deleted

## 2011-08-18 DIAGNOSIS — M67919 Unspecified disorder of synovium and tendon, unspecified shoulder: Secondary | ICD-10-CM | POA: Insufficient documentation

## 2011-08-18 DIAGNOSIS — M719 Bursopathy, unspecified: Secondary | ICD-10-CM | POA: Insufficient documentation

## 2011-08-18 DIAGNOSIS — M7500 Adhesive capsulitis of unspecified shoulder: Secondary | ICD-10-CM | POA: Insufficient documentation

## 2011-08-18 DIAGNOSIS — G4730 Sleep apnea, unspecified: Secondary | ICD-10-CM | POA: Insufficient documentation

## 2011-08-18 DIAGNOSIS — Z5333 Arthroscopic surgical procedure converted to open procedure: Secondary | ICD-10-CM | POA: Insufficient documentation

## 2011-08-18 DIAGNOSIS — S43439A Superior glenoid labrum lesion of unspecified shoulder, initial encounter: Secondary | ICD-10-CM | POA: Insufficient documentation

## 2011-08-18 DIAGNOSIS — Y929 Unspecified place or not applicable: Secondary | ICD-10-CM | POA: Insufficient documentation

## 2011-08-18 DIAGNOSIS — K219 Gastro-esophageal reflux disease without esophagitis: Secondary | ICD-10-CM | POA: Insufficient documentation

## 2011-08-18 DIAGNOSIS — M249 Joint derangement, unspecified: Secondary | ICD-10-CM | POA: Insufficient documentation

## 2011-08-18 DIAGNOSIS — X58XXXA Exposure to other specified factors, initial encounter: Secondary | ICD-10-CM | POA: Insufficient documentation

## 2011-08-18 HISTORY — DX: Anxiety disorder, unspecified: F41.9

## 2011-08-18 HISTORY — DX: Sleep apnea, unspecified: G47.30

## 2011-08-18 HISTORY — DX: Unspecified osteoarthritis, unspecified site: M19.90

## 2011-08-18 HISTORY — DX: Sciatica, unspecified side: M54.30

## 2011-08-18 LAB — POCT HEMOGLOBIN-HEMACUE: Hemoglobin: 11.4 g/dL — ABNORMAL LOW (ref 13.0–17.0)

## 2011-08-18 SURGERY — ARTHROSCOPY, SHOULDER, WITH ROTATOR CUFF REPAIR
Anesthesia: General | Site: Shoulder | Laterality: Left | Wound class: Clean

## 2011-08-18 MED ORDER — FENTANYL CITRATE 0.05 MG/ML IJ SOLN
50.0000 ug | INTRAMUSCULAR | Status: DC | PRN
  Administered 2011-08-18: 100 ug via INTRAVENOUS

## 2011-08-18 MED ORDER — CEFAZOLIN SODIUM 1-5 GM-% IV SOLN: 1.0000 g | Freq: Once | INTRAVENOUS | Status: DC

## 2011-08-18 MED ORDER — MIDAZOLAM HCL 2 MG/2ML IJ SOLN
0.5000 mg | INTRAMUSCULAR | Status: DC | PRN
  Administered 2011-08-18: 2 mg via INTRAVENOUS

## 2011-08-18 MED ORDER — ONDANSETRON HCL 4 MG PO TABS: 4.0000 mg | ORAL_TABLET | Freq: Four times a day (QID) | ORAL | Status: DC | PRN

## 2011-08-18 MED ORDER — HYDROCODONE-ACETAMINOPHEN 5-325 MG PO TABS: 1.0000 | ORAL_TABLET | ORAL | Status: DC | PRN

## 2011-08-18 MED ORDER — IBUPROFEN 600 MG PO TABS: 600.0000 mg | ORAL_TABLET | Freq: Four times a day (QID) | ORAL | Status: AC | PRN

## 2011-08-18 MED ORDER — CEFAZOLIN SODIUM-DEXTROSE 2-3 GM-% IV SOLR
2.0000 g | Freq: Once | INTRAVENOUS | Status: AC
  Administered 2011-08-18: 2 g via INTRAVENOUS

## 2011-08-18 MED ORDER — LIDOCAINE HCL 1 % IJ SOLN
INTRAMUSCULAR | Status: DC | PRN
  Administered 2011-08-18: 2 mL via INTRADERMAL

## 2011-08-18 MED ORDER — OXYCODONE-ACETAMINOPHEN 5-325 MG PO TABS
1.0000 | ORAL_TABLET | ORAL | Status: DC | PRN
  Administered 2011-08-18 – 2011-08-19 (×2): 2 via ORAL

## 2011-08-18 MED ORDER — ROPIVACAINE HCL 5 MG/ML IJ SOLN
INTRAMUSCULAR | Status: DC | PRN
  Administered 2011-08-18: 20 mL via EPIDURAL

## 2011-08-18 MED ORDER — METOCLOPRAMIDE HCL 5 MG/ML IJ SOLN: 10.0000 mg | Freq: Once | INTRAMUSCULAR | Status: AC | PRN

## 2011-08-18 MED ORDER — LACTATED RINGERS IV SOLN
INTRAVENOUS | Status: DC
  Administered 2011-08-18 (×2): via INTRAVENOUS

## 2011-08-18 MED ORDER — CHLORHEXIDINE GLUCONATE 4 % EX LIQD: 60.0000 mL | Freq: Once | CUTANEOUS | Status: DC

## 2011-08-18 MED ORDER — MORPHINE SULFATE 2 MG/ML IJ SOLN
0.0500 mg/kg | INTRAMUSCULAR | Status: DC | PRN
  Administered 2011-08-19: 2 mg via INTRAVENOUS

## 2011-08-18 MED ORDER — ONDANSETRON HCL 4 MG/2ML IJ SOLN: 4.0000 mg | Freq: Four times a day (QID) | INTRAMUSCULAR | Status: DC | PRN

## 2011-08-18 MED ORDER — CEFAZOLIN SODIUM 1-5 GM-% IV SOLN
1.0000 g | Freq: Three times a day (TID) | INTRAVENOUS | Status: DC
  Administered 2011-08-19 (×2): 1 g via INTRAVENOUS

## 2011-08-18 MED ORDER — HYDROMORPHONE HCL 2 MG PO TABS: ORAL_TABLET | ORAL | Status: AC

## 2011-08-18 MED ORDER — METHOCARBAMOL 500 MG PO TABS
500.0000 mg | ORAL_TABLET | Freq: Four times a day (QID) | ORAL | Status: DC | PRN
  Administered 2011-08-19: 500 mg via ORAL

## 2011-08-18 MED ORDER — DEXTROSE-NACL 5-0.45 % IV SOLN
INTRAVENOUS | Status: DC
  Administered 2011-08-18: 18:00:00 via INTRAVENOUS

## 2011-08-18 MED ORDER — SODIUM CHLORIDE 0.9 % IR SOLN
Status: DC | PRN
  Administered 2011-08-18: 3000 mL

## 2011-08-18 MED ORDER — DEXAMETHASONE SODIUM PHOSPHATE 10 MG/ML IJ SOLN
INTRAMUSCULAR | Status: DC | PRN
  Administered 2011-08-18: 10 mg via INTRAVENOUS

## 2011-08-18 MED ORDER — DIPHENHYDRAMINE HCL 25 MG PO CAPS: 25.0000 mg | ORAL_CAPSULE | Freq: Four times a day (QID) | ORAL | Status: DC | PRN

## 2011-08-18 MED ORDER — CEPHALEXIN 500 MG PO CAPS: 500.0000 mg | ORAL_CAPSULE | Freq: Three times a day (TID) | ORAL | Status: AC

## 2011-08-18 MED ORDER — PROPOFOL 10 MG/ML IV BOLUS
INTRAVENOUS | Status: DC | PRN
  Administered 2011-08-18: 200 mg via INTRAVENOUS

## 2011-08-18 MED ORDER — FENTANYL CITRATE 0.05 MG/ML IJ SOLN: 25.0000 ug | INTRAMUSCULAR | Status: DC | PRN

## 2011-08-18 MED ORDER — ONDANSETRON HCL 4 MG/2ML IJ SOLN
INTRAMUSCULAR | Status: DC | PRN
  Administered 2011-08-18: 4 mg via INTRAVENOUS

## 2011-08-18 MED ORDER — HYDROMORPHONE HCL PF 1 MG/ML IJ SOLN
0.5000 mg | INTRAMUSCULAR | Status: DC | PRN
  Administered 2011-08-19: 1 mg via INTRAVENOUS

## 2011-08-18 MED ORDER — FENTANYL CITRATE 0.05 MG/ML IJ SOLN
INTRAMUSCULAR | Status: DC | PRN
  Administered 2011-08-18 (×2): 25 ug via INTRAVENOUS
  Administered 2011-08-18: 50 ug via INTRAVENOUS

## 2011-08-18 MED ORDER — METHOCARBAMOL 100 MG/ML IJ SOLN: 500.0000 mg | Freq: Four times a day (QID) | INTRAVENOUS | Status: DC | PRN

## 2011-08-18 MED ORDER — CISATRACURIUM BESYLATE 2 MG/ML IV SOLN
INTRAVENOUS | Status: DC | PRN
  Administered 2011-08-18: 18 mg via INTRAVENOUS

## 2011-08-18 SURGICAL SUPPLY — 89 items
ANCHOR BIO SWLOCK 4.75 W/TIG (Anchor) ×2 IMPLANT
ANCHOR SUT BIO SW 4.75 W/FIB (Anchor) ×2 IMPLANT
ANCHOR SUT BIO SW 4.75X19.1 (Anchor) ×4 IMPLANT
BANDAGE ADHESIVE 1X3 (GAUZE/BANDAGES/DRESSINGS) IMPLANT
BLADE AVERAGE 25X9 (BLADE) IMPLANT
BLADE CUTTER MENIS 5.5 (BLADE) ×2 IMPLANT
BLADE SURG 15 STRL LF DISP TIS (BLADE) ×2 IMPLANT
BLADE SURG 15 STRL SS (BLADE) ×2
BLADE VORTEX 6.0 (BLADE) IMPLANT
BUR EGG/OVAL CARBIDE (BURR) ×2 IMPLANT
BUR OVAL 6.0 (BURR) ×2 IMPLANT
CANISTER OMNI JUG 16 LITER (MISCELLANEOUS) ×2 IMPLANT
CANISTER SUCTION 2500CC (MISCELLANEOUS) ×2 IMPLANT
CANNULA 5.75X7 CRYSTAL CLEAR (CANNULA) IMPLANT
CANNULA SHOULDER 7CM (CANNULA) IMPLANT
CANNULA TWIST IN 8.25X7CM (CANNULA) IMPLANT
CLEANER CAUTERY TIP 5X5 PAD (MISCELLANEOUS) IMPLANT
CLOSURE STERI STRIP 1/2 X4 (GAUZE/BANDAGES/DRESSINGS) ×2 IMPLANT
CLOTH BEACON ORANGE TIMEOUT ST (SAFETY) ×2 IMPLANT
CUTTER MENISCUS  4.2MM (BLADE) ×1
CUTTER MENISCUS 4.2MM (BLADE) ×1 IMPLANT
DECANTER SPIKE VIAL GLASS SM (MISCELLANEOUS) IMPLANT
DRAPE INCISE IOBAN 66X45 STRL (DRAPES) ×2 IMPLANT
DRAPE STERI 35X30 U-POUCH (DRAPES) ×2 IMPLANT
DRAPE SURG 17X23 STRL (DRAPES) ×2 IMPLANT
DRAPE U-SHAPE 47X51 STRL (DRAPES) ×2 IMPLANT
DRAPE U-SHAPE 76X120 STRL (DRAPES) ×4 IMPLANT
DRSG PAD ABDOMINAL 8X10 ST (GAUZE/BANDAGES/DRESSINGS) ×2 IMPLANT
DURAPREP 26ML APPLICATOR (WOUND CARE) ×2 IMPLANT
ELECT REM PT RETURN 9FT ADLT (ELECTROSURGICAL) ×2
ELECTRODE REM PT RTRN 9FT ADLT (ELECTROSURGICAL) ×1 IMPLANT
GAUZE SPONGE 4X4 12PLY STRL LF (GAUZE/BANDAGES/DRESSINGS) ×2 IMPLANT
GLOVE BIO SURGEON STRL SZ 6.5 (GLOVE) ×4 IMPLANT
GLOVE BIOGEL M STRL SZ7.5 (GLOVE) ×2 IMPLANT
GLOVE BIOGEL PI IND STRL 7.0 (GLOVE) ×2 IMPLANT
GLOVE BIOGEL PI IND STRL 8 (GLOVE) ×2 IMPLANT
GLOVE BIOGEL PI INDICATOR 7.0 (GLOVE) ×2
GLOVE BIOGEL PI INDICATOR 8 (GLOVE) ×2
GLOVE ORTHO TXT STRL SZ7.5 (GLOVE) ×2 IMPLANT
GOWN BRE IMP PREV XXLGXLNG (GOWN DISPOSABLE) ×2 IMPLANT
GOWN PREVENTION PLUS XLARGE (GOWN DISPOSABLE) ×4 IMPLANT
GOWN PREVENTION PLUS XXLARGE (GOWN DISPOSABLE) IMPLANT
GOWN STRL REIN 2XL XLG LVL4 (GOWN DISPOSABLE) ×2 IMPLANT
NDL SUT 6 .5 CRC .975X.05 MAYO (NEEDLE) IMPLANT
NEEDLE MAYO TAPER (NEEDLE)
NEEDLE MINI RC 24MM (NEEDLE) IMPLANT
NEEDLE SCORPION (NEEDLE) IMPLANT
PACK ARTHROSCOPY DSU (CUSTOM PROCEDURE TRAY) ×2 IMPLANT
PACK BASIN DAY SURGERY FS (CUSTOM PROCEDURE TRAY) ×2 IMPLANT
PAD CLEANER CAUTERY TIP 5X5 (MISCELLANEOUS)
PASSER SUT SWANSON 36MM LOOP (INSTRUMENTS) IMPLANT
PENCIL BUTTON HOLSTER BLD 10FT (ELECTRODE) IMPLANT
RESECTOR FULL RADIUS 4.2MM (BLADE) IMPLANT
RESECTOR FULL RADIUS 4.8MM (BLADE) IMPLANT
SLEEVE SCD COMPRESS KNEE MED (MISCELLANEOUS) ×2 IMPLANT
SLING ARM FOAM STRAP LRG (SOFTGOODS) IMPLANT
SLING ARM FOAM STRAP MED (SOFTGOODS) IMPLANT
SPONGE GAUZE 4X4 12PLY (GAUZE/BANDAGES/DRESSINGS) ×2 IMPLANT
SPONGE LAP 4X18 X RAY DECT (DISPOSABLE) ×2 IMPLANT
STRIP CLOSURE SKIN 1/2X4 (GAUZE/BANDAGES/DRESSINGS) ×2 IMPLANT
SUCTION FRAZIER TIP 10 FR DISP (SUCTIONS) IMPLANT
SUT ETHIBOND 2 OS 4 DA (SUTURE) IMPLANT
SUT ETHILON 4 0 PS 2 18 (SUTURE) IMPLANT
SUT FIBERWIRE #2 38 T-5 BLUE (SUTURE) ×2
SUT FIBERWIRE 3-0 18 TAPR NDL (SUTURE)
SUT PROLENE 1 CT (SUTURE) IMPLANT
SUT PROLENE 3 0 PS 2 (SUTURE) ×2 IMPLANT
SUT TIGER TAPE 7 IN WHITE (SUTURE) IMPLANT
SUT VIC AB 0 CT1 27 (SUTURE)
SUT VIC AB 0 CT1 27XBRD ANBCTR (SUTURE) IMPLANT
SUT VIC AB 0 SH 27 (SUTURE) ×2 IMPLANT
SUT VIC AB 2-0 SH 27 (SUTURE) ×1
SUT VIC AB 2-0 SH 27XBRD (SUTURE) ×1 IMPLANT
SUT VIC AB 3-0 SH 27 (SUTURE)
SUT VIC AB 3-0 SH 27X BRD (SUTURE) IMPLANT
SUT VIC AB 3-0 X1 27 (SUTURE) IMPLANT
SUTURE FIBERWR #2 38 T-5 BLUE (SUTURE) ×1 IMPLANT
SUTURE FIBERWR 3-0 18 TAPR NDL (SUTURE) IMPLANT
SYR 3ML 23GX1 SAFETY (SYRINGE) IMPLANT
SYR BULB 3OZ (MISCELLANEOUS) IMPLANT
TAPE FIBER 2MM 7IN #2 BLUE (SUTURE) IMPLANT
TAPE PAPER 2X10 WHT MICROPORE (GAUZE/BANDAGES/DRESSINGS) ×2 IMPLANT
TAPE PAPER 3X10 WHT MICROPORE (GAUZE/BANDAGES/DRESSINGS) ×2 IMPLANT
TOWEL OR 17X24 6PK STRL BLUE (TOWEL DISPOSABLE) ×2 IMPLANT
TUBE CONNECTING 20X1/4 (TUBING) ×4 IMPLANT
TUBING ARTHROSCOPY IRRIG 16FT (MISCELLANEOUS) IMPLANT
WAND STAR VAC 90 (SURGICAL WAND) ×2 IMPLANT
WATER STERILE IRR 1000ML POUR (IV SOLUTION) ×2 IMPLANT
YANKAUER SUCT BULB TIP NO VENT (SUCTIONS) IMPLANT

## 2011-08-18 NOTE — Op Note (Signed)
Op note dictated:  [id number] 08/18/11

## 2011-08-18 NOTE — Brief Op Note (Addendum)
08/18/2011  3:22 PM  PATIENT:  [name] [name]  56 y.o. male  PRE-OPERATIVE DIAGNOSIS:  left shoulder pasta tear supraspinatus and infraspinatus labral tear  POST-OPERATIVE DIAGNOSIS:  left shoulder pasta tear 90% infraspinatus, 50 %supraspinatus, type 3 SLAP with unstable biceps and grade 4 chondromalacia od glenoid  PROCEDURE:  Procedure(s): SHOULDER ARTHROSCOPY WITH ROTATOR CUFF REPAIR, BICEPS TENODESIS, LABRAL DEBRIDEMENT AND CAPSULAR DEBRIDEMENT  SURGEON:  Surgeon(s): Wyn Forster., MD  PHYSICIAN ASSISTANT:   ASSISTANTS:    ANESTHESIA:   general  EBL:  Total I/O In: 1000 [I.V.:1000] Out: -   BLOOD ADMINISTERED:none  DRAINS: none   LOCAL MEDICATIONS USED:  NONE  SPECIMEN:  No Specimen  DISPOSITION OF SPECIMEN:  N/A  COUNTS:  YES  TOURNIQUET:  * No tourniquets in log *  DICTATION: op note dictated:  [id number]  PLAN OF CARE: Admit for overnight observation  PATIENT DISPOSITION:  PACU - hemodynamically stable.

## 2011-08-18 NOTE — Transfer of Care (Signed)
Immediate Anesthesia Transfer of Care Note  Patient: [name] [name]  Procedure(s) Performed:  SHOULDER ARTHROSCOPY WITH ROTATOR CUFF REPAIR - subacromial decompression,open rotator cuff repair and biceps tenosis  Patient Location: PACU  Anesthesia Type: GA combined with regional for post-op pain  Level of Consciousness: sedated  Airway & Oxygen Therapy: Patient Spontanous Breathing and Patient connected to face mask oxygen  Post-op Assessment: Report given to PACU RN and Post -op Vital signs reviewed and stable  Post vital signs: Reviewed and stable  Complications: No apparent anesthesia complications

## 2011-08-18 NOTE — Progress Notes (Signed)
Assisted Dr. Frederick with left, ultrasound guided, interscalene  block. Side rails up, monitors on throughout procedure. See vital signs in flow sheet. Tolerated Procedure well. 

## 2011-08-18 NOTE — Anesthesia Procedure Notes (Addendum)
Anesthesia Regional Block:  Interscalene brachial plexus block  Pre-Anesthetic Checklist: ,, timeout performed, Correct Patient, Correct Site, Correct Laterality, Correct Procedure, Correct Position, site marked, Risks and benefits discussed,  Surgical consent,  Pre-op evaluation,  At surgeon's request and post-op pain management  Laterality: Left  Prep: chloraprep       Needles:   Needle Type: Other   (Arrow Echogenic)   Needle Length: 9cm  Needle Gauge: 21    Additional Needles:  Procedures: ultrasound guided Interscalene brachial plexus block Narrative:  Start time: 08/18/2011 11:45 AM End time: 08/18/2011 11:51 AM Injection made incrementally with aspirations every 5 mL.  Performed by: Personally  Anesthesiologist: Aldona Lento, MD  Additional Notes: Ultrasound guidance used to: id relevant anatomy, confirm needle position, local anesthetic spread, avoidance of vascular puncture. Picture saved. No complications. Block performed personally by Janetta Hora. Gelene Mink, MD    Interscalene brachial plexus block Procedure Name: Intubation Date/Time: 08/18/2011 1:50 PM Performed by: Jearld Shines Pre-anesthesia Checklist: Patient identified, Timeout performed, Emergency Drugs available, Suction available and Patient being monitored Patient Re-evaluated:Patient Re-evaluated prior to inductionOxygen Delivery Method: Circle System Utilized Preoxygenation: Pre-oxygenation with 100% oxygen Intubation Type: IV induction Ventilation: Mask ventilation without difficulty Laryngoscope Size: Miller and 3 Grade View: Grade I Tube type: Oral Tube size: 8.0 mm Number of attempts: 1 Placement Confirmation: ETT inserted through vocal cords under direct vision,  positive ETCO2 and breath sounds checked- equal and bilateral Secured at: 21 cm Tube secured with: Tape Dental Injury: Teeth and Oropharynx as per pre-operative assessment

## 2011-08-18 NOTE — Anesthesia Postprocedure Evaluation (Signed)
  Anesthesia Post-op Note  Patient: [name] [name]  Procedure(s) Performed:  SHOULDER ARTHROSCOPY WITH ROTATOR CUFF REPAIR - subacromial decompression,open rotator cuff repair and biceps tenosis  Patient Location: PACU  Anesthesia Type: GA combined with regional for post-op pain  Level of Consciousness: awake  Airway and Oxygen Therapy: Patient Spontanous Breathing  Post-op Pain: none  Post-op Assessment: Post-op Vital signs reviewed  Post-op Vital Signs: stable  Complications: No apparent anesthesia complications

## 2011-08-18 NOTE — H&P (Signed)
[name] [name] is an 56 y.o. male.   Chief Complaint: Complaining of recurrent left shoulder pain HPI: . [name] [name] is s/p left shoulder arthroscopic subacromial decompression, distal clavicle resection, and debridement of a labral tear for an anterior perilabral cyst in January 2005. He has been very active working out and maintaining a high state of fitness. [name] [name] is now 78-years of age. He is Engineer, production at [employer]. He has been working out vigorously with weights and was benching 120 lbs routinely. In mid summer he felt a snap to his left shoulder and since that time he has had increasing pain, night pain, loss of ROM, and difficulty sleeping on the left side. He seeks a follow up consult at this time.  Past Medical History  Diagnosis Date  . GERD (gastroesophageal reflux disease)   . Arthritis     shoulders  . Sleep apnea     uses CPAP nightly  . ADD (attention deficit disorder)   . Depression   . Anxiety   . Sciatic nerve pain   . Shoulder pain 07/2011    PASTA tear teres minor left shoulder    Past Surgical History  Procedure Date  . Appendectomy   . Shoulder surgery 08/21/2003 - left    1995 - right  . Prostate surgery 10/01/2008    transurethral incision prostate  . Hernia repair     inguinal  . Nasal septoplasty w/ turbinoplasty 01/04/2007    inferior turb. reduction  . Ulnar nerve repair 03/10/2006    decompression left ulnar nerve  . Ganglion cyst excision     right elbow, left ankle - separate surgeries  . Lasik     Family History  Problem Relation Age of Onset  . Lung cancer Father   . Cancer Father     lung ca  . Diabetes Other   . Asthma Other   . Coronary artery disease Other    Social History:  reports that he has never smoked. He has never used smokeless tobacco. He reports that he drinks alcohol. He reports that he does not use illicit drugs.  Allergies: No Known Allergies  Medications Prior to Admission  Medication Dose Route  Frequency Provider Last Rate Last Dose  . ceFAZolin (ANCEF) IVPB 2 g/50 mL premix  2 g Intravenous Once Wyn Forster., MD      . chlorhexidine (HIBICLENS) 4 % liquid 4 application  60 mL Topical Once       . fentaNYL (SUBLIMAZE) injection 50-100 mcg  50-100 mcg Intravenous PRN Constance Goltz, MD   100 mcg at 08/18/11 1144  . lactated ringers infusion   Intravenous Continuous Constance Goltz, MD      . midazolam (VERSED) injection 0.5-2 mg  0.5-2 mg Intravenous PRN Constance Goltz, MD   2 mg at 08/18/11 1144  . DISCONTD: ceFAZolin (ANCEF) IVPB 1 g/50 mL premix  1 g Intravenous Once        Medications Prior to Admission  Medication Sig Dispense Refill  . aspirin 81 MG tablet Take 81 mg by mouth daily.       . B Complex-C (SUPER B COMPLEX) TABS Take 1 tablet by mouth daily.        . Cholecalciferol 1000 UNITS tablet Take 1,000 Units by mouth daily.        . clonazePAM (KLONOPIN) 1 MG tablet Take 1 mg by mouth as needed.         Results  for orders placed during the hospital encounter of 08/18/11 (from the past 48 hour(s))  POCT HEMOGLOBIN-HEMACUE     Status: Abnormal   Collection Time   08/18/11 11:38 AM      Component Value Range Comment   Hemoglobin 11.4 (*) 13.0 - 17.0 (g/dL)     No results found.   Pertinent items are noted in HPI.  Blood pressure 140/84, pulse 71, temperature 98.4 F (36.9 C), temperature source Oral, resp. rate 19, height 5\' 9"  (1.753 m), weight 81.647 kg (180 lb), SpO2 96.00%.  General appearance: alert Head: Normocephalic, without obvious abnormality Neck: supple, symmetrical, trachea midline Resp: clear to auscultation bilaterally Cardio: regular rate and rhythm, S1, S2 normal, no murmur, click, rub or gallop GI: normal findings: bowel sounds normal Extremities:. His shoulder ROM reveals combined elevation right shoulder 180, left shoulder 175, external rotation right shoulder at 90 degrees abduction 95 and left shoulder 85,  internal rotation right shoulder T12 and left shoulder T12. His extension at 90 degrees abduction lacks 10 degrees anterior to the interscapular plane on the left vs 5 degrees posterior to the plane on the left. [name] [name] has a positive push off sign on the left, pain with resisted scaption and abduction external rotation on the left perceived in the region of the cuff. Pulses: 2+ and symmetric Skin: normal Neurologic: Grossly normal  MRI:.  He has adhesive capsulitis, he has a PASTA tear of the supraspinatus. He has an interstitial tear of the infraspinatus musculotendinous junction without surface extension, subscapularis tendinosis, possible tear and satisfactory decompression from prior surgery.  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 The procedure risks benefits and postoperative course were discussed with the patient at length and he was in agreement with this plan.  DASNOIT,Asahd Can J 08/18/2011, 12:14 PM    H&P documentation: 08/18/2011  -History and Physical Reviewed  -Patient has been re-examined  -No change in the plan of care  Wyn Forster, MD

## 2011-08-18 NOTE — H&P (Signed)
June 01, 2011   [name] [name], [name] [name] Fax # [phone]  RE: [name] [name]. Perlmutter DOB: 07/17/1956  Dear [name] [name]:  [name] [name] presents for evaluation of a painful left shoulder. I have known [name] [name] for many years as I cared for him and his wife [name] [name]. [name] [name] is s/p left shoulder arthroscopic subacromial decompression, distal clavicle resection, and debridement of a labral tear for an anterior perilabral cyst in January 2005. He has been very active working out and maintaining a high state of fitness. [name] [name] is now 56-years of age. He is Engineer, production at [employer]. He has been working out vigorously with weights and was benching 120 lbs routinely. In mid summer he felt a snap to his left shoulder and since that time he has had increasing pain, night pain, loss of ROM, and difficulty sleeping on the left side. He seeks a follow up consult at this time.  His past medical history is reviewed in detail. He is 5' 9 " tall and weighs 174 lbs. He is not using any medications except for occasional Hydrocodone for back pain for his shoulder symptoms. He has no drug allergies. Current medications include Zegerid 1 tablet daily, Arthrotec 75 mg daily, Flonase 2 sprays at bedtime, Bupropion 300 mg daily, Abilify 2 mg daily, Prestiq 100 mg in the morning, Clonazepam  mg AM and HS, and Ambien PRN sleep. His social history reveals he is married. He is a nonsmoker. He enjoys a rare alcoholic beverage. His family history is detailed and positive for history of primary relatives with pacemaker placement. His own past surgery history includes removal of a cyst from his right wrist and left ankle in the 1980's, left elbow ulnar nerve decompression in 2007, shoulder arthroscopy 2005, suprascapular nerve decompression in 1995 right, inguinal hernia repair 1981, TURP 2010. A 14 system review of systems reveals corrective lenses for presbyopia, history of sleep apnea and use of a CPAP machine and a history of  depression.  Physical exam reveals a well appearing 56 year old gentleman. He is in excellent state of fitness. His shoulder ROM reveals combined elevation right shoulder 180, left shoulder 175, external rotation right shoulder at 90 degrees abduction 95 and left shoulder 85, internal rotation right shoulder T12 and left shoulder T12. His extension at 90 degrees abduction lacks 10 degrees anterior to the interscapular plane on the left vs 5 degrees posterior to the plane on the left. [name] [name] has a positive push off sign on the left, pain with resisted scaption and abduction external rotation on the left perceived in the region of the cuff.[name] [name], [name] [name] Page 2 June 01, 2011  RE: [name] [name]. [name] [name] DOB: 08/08/1956  Plain films of the left shoulder demonstrates a stable distal clavicle resection and satisfactory subacromial decompression. He has some sclerosis at the greater tuberosity.  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   Plan: He is referred to see [name] [name] for a course of supervised physical therapy to try and decrease his capsular tightness. If his pain persists we will consider imaging of the rotator cuff and injection of the glenohumeral join with Depo Medrol and Lidocaine.  I will keep you informed of his progress. Thank you for allowing me to participate in the care of your patients.  Best regards,    [name] [name]. [name] [name]., [name] [name] RVS/phe T: 06-02-11    PATIENT:    [name] [name]  SEEN BY: [name] [name], [name] [name]. [name] [name]   OFFICE VISIT:    11.7.12  DOB:    [birth year]  [name] [name] returns for follow-up examination of his painful left shoulder. He has regained about 90% normal motion working with [name] [name]. He is still weak in external rotation at neutral and in 90 degrees of abduction.  He still has a mild contracture of his capsule.  He has a painful push off test.  He lacks extension to the interscapular  plane by 5 degrees on the left compared with 20 degrees on the right.  [name] [name] will work with [name] [name] for another four to six visits.  I will see him back in follow-up in two weeks.  If he does not regain pain-free motion we will send him for a MRI looking for a rotator cuff tear.  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  While the odds of this occurring bilaterally are slim it is not impossible.   [name] [name], M.D., Jr./cmf   T:  11.8.12  Faxed to:    [name] [name], [name] [name]  OFFICE VISIT    07-11-11    Seen by: [name] [name], [name] [name]., [name] [name]   [name] [name] returns with his wife [name] [name] for evaluation of his painful left shoulder. He has increased his ROM substantially with [name] [name]. He can elevate 175, externally rotate 85. He can internally rotate to T12. He has a negative push off test on the left, positive on the right. 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   He has pain with scaption against resistance, pain on abduction external rotation against resistance.  Assessment: Improvement in his adhesive capsulitis symptoms with persistent rotator cuff pain on stress testing.   Plan: [name] [name] is sent for an MR of his left shoulder. 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 I will see [name] [name] back to interpret his images next week. Questions were invited and answered in detail.   RVS/phe T: 07-14-11  PATIENT: [name] [name]. [name] [name]     SEEN BY:   [name] [name]. [name] [name]., M.D.   OFFICE VISIT:      12.5.12                   DOB:   [birth year]  [name] [name] returns with his wife, [name] [name], to review the MRI of his left shoulder.  [name] [name] has a number of pathologies in his left shoulder that account for his pain predicament.  He has adhesive capsulitis, he has a PASTA tear of the supraspinatus. He has an interstitial tear of the infraspinatus musculotendinous junction without surface extension, subscapularis  tendinosis, possible tear and satisfactory decompression from prior surgery.  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    We discussed these issues in detail.   Jc would like to proceed with arthroscopic evaluation of his shoulder, subacromial decompression and repair of the cuff.  We will likely perform this arthroscopically.  If he has a true PASTA lesion we will repair subscapularis if necessary.    The surgery and aftercare were described in detail.  [name] [name], M.D., Jr./cmf   T:  12.7.12

## 2011-08-18 NOTE — Anesthesia Preprocedure Evaluation (Signed)
Anesthesia Evaluation  Patient identified by MRN, date of birth, ID band Patient awake    Reviewed: Allergy & Precautions, H&P , NPO status , Patient's Chart, lab work & pertinent test results, reviewed documented beta blocker date and time   Airway Mallampati: II TM Distance: >3 FB Neck ROM: full    Dental   Pulmonary sleep apnea ,          Cardiovascular neg cardio ROS     Neuro/Psych PSYCHIATRIC DISORDERS Negative Neurological ROS     GI/Hepatic negative GI ROS, Neg liver ROS, GERD-  Medicated and Controlled,  Endo/Other  Negative Endocrine ROS  Renal/GU negative Renal ROS  Genitourinary negative   Musculoskeletal   Abdominal   Peds  Hematology negative hematology ROS (+)   Anesthesia Other Findings See surgeon's H&P   Reproductive/Obstetrics negative OB ROS                           Anesthesia Physical Anesthesia Plan  ASA: III  Anesthesia Plan: General   Post-op Pain Management: MAC Combined w/ Regional for Post-op pain   Induction: Intravenous  Airway Management Planned: Oral ETT  Additional Equipment:   Intra-op Plan:   Post-operative Plan: Extubation in OR  Informed Consent: I have reviewed the patients History and Physical, chart, labs and discussed the procedure including the risks, benefits and alternatives for the proposed anesthesia with the patient or authorized representative who has indicated his/her understanding and acceptance.     Plan Discussed with: CRNA and Surgeon  Anesthesia Plan Comments:         Anesthesia Quick Evaluation

## 2011-08-19 NOTE — Op Note (Signed)
NAME:  [name] [name], [name] [name]                  ACCOUNT NO.:  [id number]  MEDICAL RECORD NO.:  [id number]  LOCATION:                                 FACILITY:  PHYSICIAN:  Katy Fitch. Scherry Laverne, M.D.      DATE OF BIRTH:  DATE OF PROCEDURE:  08/18/2011 DATE OF DISCHARGE:                              OPERATIVE REPORT   PREOPERATIVE DIAGNOSES: 1. Partial articular surface rotator cuff tear, left shoulder     documented by preoperative MRI. 2. Also, probable significant labral degenerative tear and rule out     superior labral anterior-posterior lesion, based on preoperative     examination and MRI evaluation.  POSTOPERATIVE DIAGNOSES: 1. 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 2. 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 3. 90% partial articular surface tear of infraspinatus. 4. 60% partial articular surface tear of supraspinatus. 5. Unstable type 3 superior labral anterior-posterior lesion.  OPERATIONS: 1. Examination of left shoulder under anesthesia, demonstrating     significant glenohumeral instability anteriorly and mild     instability posteriorly. 2. Diagnostic arthroscopy, left shoulder, documenting grade 4     chondromalacia of anterior-inferior quadrant of glenoid.  An     unstable Buford lesion and moderate adhesive capsulitis, also     documentation of 60% supraspinatus PASTA tear and 90% infraspinatus     PASTA tear.  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 3. Subacromial examination with limited bursectomy and last is open     reconstruction of rotator cuff, partial articular surface tears     including entire supraspinatus and infraspinatus tendons. 4. Biceps tenodesis.  SURGEON:  Katy Fitch. Mariann Palo, M.D.  ASSISTANT:  [name],  P.A.  ANESTHESIA:  General endotracheal supplemented by a left plexus block.  SUPERVISING ANESTHESIOLOGIST:  Janetta Hora. Gelene Mink, M.D.  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  He enjoys weight training and were exercise regimen.  We have followed him for years with respect to bilateral shoulder complaints.  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  He had arthroscopic treatment of his shoulder and resection of the spinoglenoid notch cyst, causing compression of the suprascapular nerve.  He had excellent relief of his right-sided symptoms.  In January 2005, he underwent arthroscopic evaluation of the left shoulder followed by distal clavicle resection.  [name] [name] did well for many years, however, in his 44s, he has been exercising vigorously with weight training.  He has had progressive left shoulder pain.  Clinical examination suggested a possible rotator cuff tear.  He did not show signs of typical clinical impingement.  A followup MRI of his shoulder was reviewed and revealed evidence of a partial articular surface tear of the supraspinatus infraspinatus tendons, and the radiologist interpreted the films, felt that there was a musculotendinous junction tear of the infraspinatus.  The labrum was significantly frayed and  there was some abnormal signal in the hyaline cartilage of the glenoid.  We recommended to [name] [name] that he undergo diagnostic arthroscopy of the left shoulder followed by appropriate surgical intervention.  We told him to anticipate rotator cuff repair.  Preoperatively, he was reminded of the potential risks, benefits of surgery.  Question invited and answered in detail.  He was interviewed by Dr. Justin Mend of anesthesia, who recommended a perioperative plexus block.  This was placed with ultrasound control without complication in the holding area.  Mr.  [name] was noted to have complete anesthesia of the left upper extremity.  DESCRIPTION OF PROCEDURE:  [name] [name] was brought to room 1 of the [facility] and placed in supine position on the operating table. Following the induction of general endotracheal anesthesia under Dr. Thornton Dales direct supervision, he was carefully positioned in the beach- chair position with aid of a torso and head holder designed for shoulder arthroscopy.  A 2 g of Ancef was administered as an IV prophylactic antibiotic.  The entire left upper extremity and forequarter were prepped with DuraPrep and draped with impervious arthroscopy drapes.  A routine surgical time-out was accomplished.  Examination of shoulder under anesthesia revealed combined elevation of 180, external rotation 90, 90 degrees of abduction, internal rotation of 80.  He was 2+ unstable to anterior translation and 1+ unstable to posterior translation.  We then placed the arthroscope through standard posterior viewing portal.  Diagnostic arthroscopy immediately revealed an unstable Buford complex with avulsion of the anterior glenohumeral ligament origin and a type 3 SLAP lesion.  There was marked degeneration of the superior labrum and the biceps was noted to be unstable.  There was grade 1 chondromalacia of the superior glenoid and grade 3 and 4 chondromalacia of the anterior-inferior glenoid.  A suction shaver was used to perform chondroplasty and debridement of cartilage fragments as well as the loose fragments of the Buford complex.  The biceps origin and superior and posterior labrum tissues were then debrided back to a very thin remnant that will be taken arthroscopically after the tenodesis of the biceps is accomplished.  The deep surface of the supraspinatus infraspinatus tendons were inspected and found to have the aforementioned 60 and 90% PASTA lesions. At this point, given the fact that we are going to perform a  biceps tenodesis as well as repair of PASTA lesion, I elected to proceed for precision with a muscle-splitting incision.  A 3-cm incision was fashioned between the anterior middle thirds of the deltoid.  After digital clearing of the bursa, the biceps tendon was noted.  A T-shaped incision was fashioned in the supraspinatus tendon, elevating the supraspinatus, exposing the 90% PASTA lesion of the infraspinatus.  The supraspinatus and infraspinatus tendons were debrided with a rongeur of all necrotic and degenerative tendon followed by identification of long head of the biceps.  The biceps was grasped with a #2 FiberWire suture with 6 passes in a grasping suture technique, followed by transection of the intra-articular portion of the biceps tendon.  The rotator cuff was debrided thoroughly and the greater tuberosity decorticated with a power burr, lowering its profile approximately 2 mm to bleeding bone surface.  Two medial 4.75-mm swivel locks were placed, one at the central aspect of the supraspinatus, one at the central aspect of the infraspinatus followed by creation of a speed bridge technique, reconstructing the supraspinatus infraspinatus insertions.  We performed a medial bridge with the central #2 FiberWire.  The sutures were properly tensioned  laterally to create an excellent inset.  Prior to completing the repair, the biceps tendon was tenodesed with mattress suture to the supraspinatus tendon split with knots buried.  The tails were incorporated in to the anterolateral swivel lock.  A very smooth repair with excellent compression was achieved.  The scope was then replaced in the glenohumeral joint.  Clot and debris were removed.  The repair site was identified with excellent position of the sutures and good opposition of the tendon.  The biceps stump was then debrided with a 5.5- mm suction shaver and hemostasis achieved with bipolar cautery.  The bursa was examined and  limited bursectomy accomplished.  The prior decompression and distal clavicle resection performed 8 years previously were noted to be stable and quite satisfactory with mature bursa covering the surfaces.  The wounds were irrigated with sterile saline followed by repair of the deltoid muscle split with mattress suture of 0 Vicryl followed by repair of the skin with 2-0 Vicryl and intradermal 3-0 Prolene.  The portals were repaired with intradermal 3-0 Prolene.  There were no apparent complications.  Mr. Fitton was placed in a sling, awakened from general anesthesia, and transferred to recovery room in stable signs.  He will be discharged home in 18 hours with prescriptions for Keflex 500 mg 1 p.o. q.8 hours x4 days, ibuprofen 600 mg 1 p.o. q.6 hours p.r.n. pain, and Dilaudid 2 mg 1 or 2 tablets p.o. q.4 hours p.r.n. pain, 30 tablets without refill.     Katy Fitch Ludmilla Mcgillis, M.D.     RVS/MEDQ  D:  08/18/2011  T:  08/19/2011  Job:  [id number]

## 2011-08-26 ENCOUNTER — Ambulatory Visit (INDEPENDENT_AMBULATORY_CARE_PROVIDER_SITE_OTHER): Admitting: Psychology

## 2011-08-26 DIAGNOSIS — F319 Bipolar disorder, unspecified: Secondary | ICD-10-CM

## 2011-09-09 ENCOUNTER — Ambulatory Visit: Admitting: Psychology

## 2011-09-21 ENCOUNTER — Ambulatory Visit (INDEPENDENT_AMBULATORY_CARE_PROVIDER_SITE_OTHER): Admitting: Psychology

## 2011-09-21 DIAGNOSIS — F319 Bipolar disorder, unspecified: Secondary | ICD-10-CM

## 2012-02-08 ENCOUNTER — Ambulatory Visit (INDEPENDENT_AMBULATORY_CARE_PROVIDER_SITE_OTHER): Admitting: Psychology

## 2012-02-08 DIAGNOSIS — F319 Bipolar disorder, unspecified: Secondary | ICD-10-CM

## 2012-02-23 ENCOUNTER — Telehealth: Payer: Self-pay | Admitting: Pulmonary Disease

## 2012-02-23 NOTE — Telephone Encounter (Signed)
I spoke with pt and he states aug. 21 it will be 5 years he has had his cpap machine. He stated he from what he understood he would have to be re-evaluated for the new cpap machine. Pt is wanting to know if this is so. He states he has already discuss the new cpap machine with KC already before. Pt asked to be reached on his cell # [phone] since he will be out of town when [facility] returns. Please advise KC thanks    -last OV 03/09/10

## 2012-03-01 NOTE — Telephone Encounter (Signed)
Pt's are required to followup yearly in order for [place] to prescribe anything.  Have not seen him since 02/2010, so yes I need to see him before ordering anything.

## 2012-03-02 NOTE — Telephone Encounter (Signed)
LMTCB

## 2012-03-05 NOTE — Telephone Encounter (Signed)
LMOMTCB x 1 

## 2012-03-06 NOTE — Telephone Encounter (Signed)
LMTCBx3. [name] [name], CMA  

## 2012-03-06 NOTE — Telephone Encounter (Signed)
Pt returned call. I have scheduled an rov w/ dr. Shelle Iron. Nothing further needed per pt at this time. Hazel Sams

## 2012-03-13 ENCOUNTER — Ambulatory Visit (INDEPENDENT_AMBULATORY_CARE_PROVIDER_SITE_OTHER): Admitting: Psychology

## 2012-03-13 DIAGNOSIS — F319 Bipolar disorder, unspecified: Secondary | ICD-10-CM

## 2012-03-20 ENCOUNTER — Ambulatory Visit (INDEPENDENT_AMBULATORY_CARE_PROVIDER_SITE_OTHER): Admitting: Psychology

## 2012-03-20 DIAGNOSIS — F319 Bipolar disorder, unspecified: Secondary | ICD-10-CM

## 2012-03-27 ENCOUNTER — Ambulatory Visit (INDEPENDENT_AMBULATORY_CARE_PROVIDER_SITE_OTHER): Admitting: Psychology

## 2012-03-27 DIAGNOSIS — F319 Bipolar disorder, unspecified: Secondary | ICD-10-CM

## 2012-03-29 ENCOUNTER — Ambulatory Visit (INDEPENDENT_AMBULATORY_CARE_PROVIDER_SITE_OTHER): Admitting: Psychology

## 2012-03-29 DIAGNOSIS — F319 Bipolar disorder, unspecified: Secondary | ICD-10-CM

## 2012-04-02 ENCOUNTER — Ambulatory Visit (INDEPENDENT_AMBULATORY_CARE_PROVIDER_SITE_OTHER): Admitting: Psychology

## 2012-04-02 DIAGNOSIS — F319 Bipolar disorder, unspecified: Secondary | ICD-10-CM

## 2012-04-04 ENCOUNTER — Encounter: Payer: Self-pay | Admitting: Pulmonary Disease

## 2012-04-04 ENCOUNTER — Ambulatory Visit (INDEPENDENT_AMBULATORY_CARE_PROVIDER_SITE_OTHER): Admitting: Pulmonary Disease

## 2012-04-04 VITALS — BP 152/96 | HR 82 | Temp 98.5°F | Ht 70.0 in | Wt 205.6 lb

## 2012-04-04 DIAGNOSIS — G4733 Obstructive sleep apnea (adult) (pediatric): Secondary | ICD-10-CM

## 2012-04-04 NOTE — Assessment & Plan Note (Signed)
The patient has been doing very well with his current CPAP setup, however his machine is due to be replaced.  His current machine is making noise, and he prefers to be on an automatic setting.  Will order him a new device, and set on the automatic mode.  I've also encouraged him to work aggressively on weight loss.  If doing well, he will followup with me in one year.

## 2012-04-04 NOTE — Patient Instructions (Addendum)
Will order a new machine for you, and set on auto mode.  Let us know if any tolerance issues. Work on weight loss, keep up with supplies and mask changes. followup with me in one year if things are going well.

## 2012-04-04 NOTE — Progress Notes (Signed)
  Subjective:    Patient ID: [name] [name], male    DOB: 1956-05-21, 56 y.o.   MRN: [id number]  HPI The patient comes in today for followup of his known severe sleep apnea.  He has been wearing CPAP compliantly, and feels that he sleeps well with the device with adequate daytime alertness.  His wife has not heard breakthrough snoring.  He is currently on a set pressure, but prefers to be on an automatic device.  His current machine is over 93 years old, and is starting to get much louder.   Review of Systems  Constitutional: Negative for fever and unexpected weight change.  HENT: Negative for ear pain, nosebleeds, congestion, sore throat, rhinorrhea, sneezing, trouble swallowing, dental problem, postnasal drip and sinus pressure.   Eyes: Negative for redness and itching.  Respiratory: Negative for cough, chest tightness, shortness of breath and wheezing.   Cardiovascular: Negative for palpitations and leg swelling.  Gastrointestinal: Negative for nausea and vomiting.  Genitourinary: Negative for dysuria.  Musculoskeletal: Positive for arthralgias. Negative for joint swelling.  Skin: Negative for rash.  Neurological: Negative for headaches.  Hematological: Does not bruise/bleed easily.  Psychiatric/Behavioral: Negative for dysphoric mood. The patient is not nervous/anxious.   All other systems reviewed and are negative.       Objective:   Physical Exam Overweight male in no acute distress No skin breakdown or pressure necrosis from the CPAP mask Lower extremities without edema, no cyanosis Alert and oriented, moves all 4 extremities.  Does not appear to be sleepy.       Assessment & Plan:

## 2012-04-12 ENCOUNTER — Ambulatory Visit: Admitting: Psychology

## 2012-04-17 ENCOUNTER — Ambulatory Visit (INDEPENDENT_AMBULATORY_CARE_PROVIDER_SITE_OTHER): Admitting: Psychology

## 2012-04-17 DIAGNOSIS — F319 Bipolar disorder, unspecified: Secondary | ICD-10-CM

## 2012-04-24 ENCOUNTER — Ambulatory Visit (INDEPENDENT_AMBULATORY_CARE_PROVIDER_SITE_OTHER): Admitting: Psychology

## 2012-04-24 DIAGNOSIS — F319 Bipolar disorder, unspecified: Secondary | ICD-10-CM

## 2012-05-01 ENCOUNTER — Ambulatory Visit: Admitting: Psychology

## 2012-05-08 ENCOUNTER — Ambulatory Visit (INDEPENDENT_AMBULATORY_CARE_PROVIDER_SITE_OTHER): Admitting: Psychology

## 2012-05-08 DIAGNOSIS — F319 Bipolar disorder, unspecified: Secondary | ICD-10-CM

## 2012-05-16 ENCOUNTER — Ambulatory Visit: Admitting: Psychology

## 2012-05-30 ENCOUNTER — Ambulatory Visit: Admitting: Psychology

## 2012-06-20 ENCOUNTER — Ambulatory Visit (INDEPENDENT_AMBULATORY_CARE_PROVIDER_SITE_OTHER): Admitting: Psychology

## 2012-06-20 DIAGNOSIS — F319 Bipolar disorder, unspecified: Secondary | ICD-10-CM

## 2012-07-01 ENCOUNTER — Other Ambulatory Visit: Payer: Self-pay | Admitting: Internal Medicine

## 2012-08-01 ENCOUNTER — Ambulatory Visit (INDEPENDENT_AMBULATORY_CARE_PROVIDER_SITE_OTHER): Admitting: Psychology

## 2012-08-01 DIAGNOSIS — F319 Bipolar disorder, unspecified: Secondary | ICD-10-CM

## 2012-08-21 ENCOUNTER — Telehealth: Payer: Self-pay | Admitting: Internal Medicine

## 2012-08-21 NOTE — Telephone Encounter (Signed)
[facility] Triage Call Report Triage Record Num: [id number] Operator: Albertine Grates Patient Name: [name] [name] Call Date & Time: 08/17/2012 5:59:55PM Patient Phone: [phone] PCP: [name] Patient Gender: Male PCP Fax : [phone] Patient DOB: [birth year] Practice Name: [facility] Reason for Call: Caller: [name]; PCP: [name] (Adults only); CB#: [phone]; Uses [facility] for prescriptions and was able to get Omeprazole and nasal spray refilled. Could not refill Hydrocodone and is not sure if can be refilled without MD writting a new script. Has 5 or 6 tabs remaining. Only takes when needed and has not had script renewed since "March or April". Advised call office 1-6 for possible refill. Protocol(s) Used: Office Note Recommended Outcome per Protocol: Information Noted and Sent to Office Reason for Outcome: Caller information to office

## 2012-08-22 MED ORDER — HYDROCODONE-ACETAMINOPHEN 5-325 MG PO TABS: 1.0000 | ORAL_TABLET | Freq: Two times a day (BID) | ORAL | Status: DC | PRN

## 2012-08-22 NOTE — Telephone Encounter (Signed)
Rf req for hydroco/APAP 5/325. Please advise. Last OV with you was 07/2011.

## 2012-08-22 NOTE — Telephone Encounter (Signed)
Done. Thx.

## 2012-08-23 NOTE — Telephone Encounter (Signed)
Left message on pts cell phone that it was taken care of

## 2012-09-18 ENCOUNTER — Ambulatory Visit (INDEPENDENT_AMBULATORY_CARE_PROVIDER_SITE_OTHER): Admitting: Psychology

## 2012-09-18 DIAGNOSIS — F319 Bipolar disorder, unspecified: Secondary | ICD-10-CM

## 2012-10-11 ENCOUNTER — Ambulatory Visit (INDEPENDENT_AMBULATORY_CARE_PROVIDER_SITE_OTHER): Admitting: Family Medicine

## 2012-10-11 ENCOUNTER — Encounter: Payer: Self-pay | Admitting: Family Medicine

## 2012-10-11 VITALS — BP 130/92 | HR 72 | Temp 98.0°F | Ht 69.0 in | Wt 206.0 lb

## 2012-10-11 DIAGNOSIS — E291 Testicular hypofunction: Secondary | ICD-10-CM | POA: Insufficient documentation

## 2012-10-11 DIAGNOSIS — K219 Gastro-esophageal reflux disease without esophagitis: Secondary | ICD-10-CM

## 2012-10-11 DIAGNOSIS — M1990 Unspecified osteoarthritis, unspecified site: Secondary | ICD-10-CM

## 2012-10-11 DIAGNOSIS — Z23 Encounter for immunization: Secondary | ICD-10-CM

## 2012-10-11 DIAGNOSIS — R7989 Other specified abnormal findings of blood chemistry: Secondary | ICD-10-CM

## 2012-10-11 DIAGNOSIS — Z833 Family history of diabetes mellitus: Secondary | ICD-10-CM

## 2012-10-11 DIAGNOSIS — G4733 Obstructive sleep apnea (adult) (pediatric): Secondary | ICD-10-CM

## 2012-10-11 DIAGNOSIS — E785 Hyperlipidemia, unspecified: Secondary | ICD-10-CM

## 2012-10-11 DIAGNOSIS — F411 Generalized anxiety disorder: Secondary | ICD-10-CM

## 2012-10-11 DIAGNOSIS — F319 Bipolar disorder, unspecified: Secondary | ICD-10-CM

## 2012-10-11 LAB — HEMOGLOBIN A1C: Hgb A1c MFr Bld: 6.1 % (ref 4.6–6.5)

## 2012-10-11 LAB — COMPREHENSIVE METABOLIC PANEL
ALT: 30 U/L (ref 0–53)
Albumin: 4.1 g/dL (ref 3.5–5.2)
CO2: 30 mEq/L (ref 19–32)
GFR: 64.03 mL/min (ref >60.00)
Glucose, Bld: 94 mg/dL (ref 70–99)
Potassium: 4 mEq/L (ref 3.5–5.1)
Sodium: 139 mEq/L (ref 135–145)
Total Bilirubin: 0.5 mg/dL (ref 0.3–1.2)
Total Protein: 7.3 g/dL (ref 6.0–8.3)

## 2012-10-11 LAB — LIPID PANEL
HDL: 41.7 mg/dL (ref >39.00)
Total CHOL/HDL Ratio: 5
VLDL: 17.6 mg/dL (ref 0.0–40.0)

## 2012-10-11 NOTE — Patient Instructions (Addendum)
It was nice to meet you, [name] [name]. We will call you with your lab results. Hang in there.

## 2012-10-11 NOTE — Progress Notes (Signed)
Subjective:    Patient ID: [name] [name], male    DOB: 1955/10/23, 57 y.o.   MRN: [id number]  HPI  Very pleasant 57 yo male here to establish care.  Depression- on Viibryd and Methylphenidate.  Was diagnosed with bipolar disorder years ago but he says it was mild. Followed by psychiatry, Dr. Jennelle Human. Denies any episodes of mania.  Denies any symptoms of anxiety or depression.  Going through a divorce right now but he feels he and his 52 year old son are adjusting well.  GERD- symptoms well controlled on omeprazole.  Low T- recently started testosterone injections through urology, Dr. Isabel Caprice.  Chronic pain- shoulder with nerve involvement. Followed by Dr. Teressa Senter.  Uses very occasional narcotics.  OSA- uses CPAP, followed by Dr. Shelle Iron. Patient Active Problem List  Diagnosis  . NEOPLASM UNCERTAIN BHV CNCTV&OTH SOFT TISSUE  . HYPERLIPIDEMIA  . BIPOLAR DEPRESSION  . DEPRESSION  . OBSTRUCTIVE SLEEP APNEA  . TINNITUS, CHRONIC  . Esophageal Reflux  . BENIGN PROSTATIC HYPERTROPHY, WITH OBSTRUCTION  . OSTEOARTHRITIS  . DYSPHAGIA  . CHANGE IN BOWELS  . PERSONAL HX COLONIC POLYPS  . ANXIETY STATE, UNSPECIFIED  . Low testosterone   Past Medical History  Diagnosis Date  . GERD (gastroesophageal reflux disease)   . Arthritis     shoulders  . Sleep apnea     uses CPAP nightly  . ADD (attention deficit disorder)   . Depression   . Anxiety   . Sciatic nerve pain   . Shoulder pain 07/2011    PASTA tear teres minor left shoulder   Past Surgical History  Procedure Laterality Date  . Appendectomy    . Shoulder surgery  08/21/2003 - left    1995 - right  . Prostate surgery  10/01/2008    transurethral incision prostate  . Hernia repair      inguinal  . Nasal septoplasty w/ turbinoplasty  01/04/2007    inferior turb. reduction  . Ulnar nerve repair  03/10/2006    decompression left ulnar nerve  . Ganglion cyst excision      right elbow, left ankle - separate surgeries  . Lasik    .  Left shoulder surgery  2013   History  Substance Use Topics  . Smoking status: Never Smoker   . Smokeless tobacco: Never Used  . Alcohol Use: Yes     Comment: occasionally   Family History  Problem Relation Age of Onset  . Lung cancer Father   . Cancer Father     lung ca  . Diabetes Other   . Asthma Other   . Coronary artery disease Other    No Known Allergies Current Outpatient Prescriptions on File Prior to Visit  Medication Sig Dispense Refill  . aspirin 81 MG tablet Take 81 mg by mouth daily.       . Cholecalciferol 1000 UNITS tablet Take 1,000 Units by mouth daily.        . clonazePAM (KLONOPIN) 1 MG tablet Take 1 mg by mouth as needed.       . fluticasone (FLONASE) 50 MCG/ACT nasal spray USE 2 SPRAYS NASALLY DAILY  48 g  0  . HYDROcodone-acetaminophen (NORCO/VICODIN) 5-325 MG per tablet Take 1 tablet by mouth 2 (two) times daily as needed for pain.  100 tablet  1  . omeprazole (PRILOSEC) 40 MG capsule TAKE 1 CAPSULE ( 40MG  TOTAL ) BY MOUTH DAILY  90 capsule  0  . Vilazodone HCl (VIIBRYD) 40 MG TABS  Take 1 tablet by mouth daily after breakfast.        No current facility-administered medications on file prior to visit.   The PMH, PSH, Social History, Family History, Medications, and allergies have been reviewed in [facility], and have been updated if relevant.    Review of Systems    See HPI Patient reports no  vision/ hearing changes,anorexia, weight change, fever ,adenopathy, persistant / recurrent hoarseness, swallowing issues, chest pain, edema,persistant / recurrent cough, hemoptysis, dyspnea(rest, exertional, paroxysmal nocturnal), gastrointestinal  bleeding (melena, rectal bleeding), abdominal pain, excessive heart burn, GU symptoms(dysuria, hematuria, pyuria, voiding/incontinence  Issues) syncope, focal weakness, severe memory loss, concerning skin lesions, depression, anxiety, abnormal bruising/bleeding, major joint swelling.    Objective:   Physical Exam BP 130/92   Pulse 72  Temp(Src) 98 F (36.7 C)  Ht 5\' 9"  (1.753 m)  Wt 206 lb (93.441 kg)  BMI 30.41 kg/m2 General:  overweght male in NAD Eyes:  PERRL Ears:  External ear exam shows no significant lesions or deformities.  Otoscopic examination reveals clear canals, tympanic membranes are intact bilaterally without bulging, retraction, inflammation or discharge. Hearing is grossly normal bilaterally. Nose:  External nasal examination shows no deformity or inflammation. Nasal mucosa are pink and moist without lesions or exudates. Mouth:  Oral mucosa and oropharynx without lesions or exudates.  Teeth in good repair. Neck:  no carotid bruit or thyromegaly no cervical or supraclavicular lymphadenopathy  Lungs:  Normal respiratory effort, chest expands symmetrically. Lungs are clear to auscultation, no crackles or wheezes. Heart:  Normal rate and regular rhythm. S1 and S2 normal without gallop, murmur, click, rub or other extra sounds. Abdomen:  Bowel sounds positive,abdomen soft and non-tender without masses, organomegaly or hernias noted. Genitalia:  Testes bilaterally descended without nodularity, tenderness or masses. No scrotal masses or lesions. No penis lesions or urethral discharge. Extremities:  no edema     Assessment & Plan:  1. BIPOLAR DEPRESSION Stable, followed by Pscyh. On ritalin and Viibryd both prescribed by psych.   2. Esophageal reflux Quiet on Omeprazole.  3. HYPERLIPIDEMIA  - Comprehensive metabolic panel - Lipid Panel  4. OBSTRUCTIVE SLEEP APNEA Uses CPAP  5. OSTEOARTHRITIS Well controlled with very infrequent pain medication  6. Low testosterone On injectable testosterone through urology.  7. Family history of diabetes mellitus  - Hemoglobin A1c

## 2012-10-12 ENCOUNTER — Encounter: Payer: Self-pay | Admitting: *Deleted

## 2012-10-12 NOTE — Addendum Note (Signed)
Addended by: Eliezer Bottom on: 10/12/2012 08:20 AM   Modules accepted: Orders

## 2012-10-23 ENCOUNTER — Other Ambulatory Visit: Payer: Self-pay | Admitting: Internal Medicine

## 2013-03-20 ENCOUNTER — Telehealth: Payer: Self-pay | Admitting: Pulmonary Disease

## 2013-03-20 DIAGNOSIS — G4733 Obstructive sleep apnea (adult) (pediatric): Secondary | ICD-10-CM

## 2013-03-20 NOTE — Telephone Encounter (Signed)
Pt was last seen by [facility] on 04/04/12 with the following instructions:  Patient Instructions    Will order a new machine for you, and set on auto mode. Let us know if any tolerance issues.  Work on weight loss, keep up with supplies and mask changes.  followup with me in one year if things are going well.   -------  Called, spoke with pt who states his machine is on auto mode.  He doesn't believe it is working as well as it was.  C/o feeling tired, not feeling refreshed, and sleeping longer over the past month.  He has a pending OV with [facility] on 03/25/13 but would like to see if something can be done prior to this like a download order or something.  As KC is off until 8/11, will route msg to doc of the day for advise.  Dr. Vassie Loll, pls advise.  Thank you.

## 2013-03-20 NOTE — Telephone Encounter (Signed)
Called, spoke with pt.  Informed him of below.  He verbalized understanding.  I have placed order for cpap download and requested this be sent by Aug 11.  Will route msg to Ashtyn to ensure results are received for OV on Monday.

## 2013-03-20 NOTE — Telephone Encounter (Signed)
Obtain download for review with [facility] visit

## 2013-03-21 NOTE — Telephone Encounter (Signed)
Spoke with [facility]/Brenda-- Order placed 03/20/13 for CPAP D/L This has not been done yet. Steward Drone states that they will obtain this D/L asap.

## 2013-03-22 ENCOUNTER — Ambulatory Visit (INDEPENDENT_AMBULATORY_CARE_PROVIDER_SITE_OTHER): Admitting: Psychology

## 2013-03-22 DIAGNOSIS — F319 Bipolar disorder, unspecified: Secondary | ICD-10-CM

## 2013-03-22 NOTE — Telephone Encounter (Signed)
Spoke with pt-- Pt states that he sent his [place] to [facility] and he spoke with someone there and they stated that they would try and get [place] the D/L by his appt time on 8/11. I told pt that I would call Monday morning and request this from them URGENT.   Nothing further needed.

## 2013-03-25 ENCOUNTER — Ambulatory Visit (INDEPENDENT_AMBULATORY_CARE_PROVIDER_SITE_OTHER): Admitting: Pulmonary Disease

## 2013-03-25 ENCOUNTER — Encounter: Payer: Self-pay | Admitting: Pulmonary Disease

## 2013-03-25 ENCOUNTER — Telehealth: Payer: Self-pay | Admitting: Family Medicine

## 2013-03-25 VITALS — BP 150/90 | HR 83 | Temp 97.4°F | Ht 70.0 in | Wt 211.8 lb

## 2013-03-25 DIAGNOSIS — G4733 Obstructive sleep apnea (adult) (pediatric): Secondary | ICD-10-CM

## 2013-03-25 NOTE — Telephone Encounter (Signed)
Ok Thx 

## 2013-03-25 NOTE — Assessment & Plan Note (Signed)
The patient is doing well overall with his CPAP device, and has been keeping up with his mask changes and supplies.  He is not sleeping as well as he has in the past, probably related to the ongoing stressors from his divorce.  His weight has changed very little over the last one year.  I've asked him to continue with CPAP, and work aggressively on weight loss.

## 2013-03-25 NOTE — Progress Notes (Signed)
  Subjective:    Patient ID: [name] [name], male    DOB: 04-07-1956, 57 y.o.   MRN: [id number]  HPI The patient comes in today for followup of his known obstructive sleep apnea.  He is wearing CPAP compliantly on the automatic setting by his download, and feels that he is doing well overall.  He is having a lot of stress related to an ongoing divorce, feels that he is not sleeping as well because of this.  His download does show adequate control of his sleep apnea.  He is keeping up with his mask changes and supplies.  Of note, his weight is up 6 pounds since the last visit a year ago.   Review of Systems  Constitutional: Negative for fever and unexpected weight change.  HENT: Positive for congestion, sneezing and sinus pressure. Negative for ear pain, nosebleeds, sore throat, rhinorrhea, trouble swallowing, dental problem and postnasal drip.   Eyes: Negative for redness and itching.  Respiratory: Negative for cough, chest tightness, shortness of breath and wheezing.   Cardiovascular: Negative for palpitations and leg swelling.  Gastrointestinal: Negative for nausea and vomiting.  Genitourinary: Negative for dysuria.  Musculoskeletal: Negative for joint swelling.  Skin: Negative for rash.  Neurological: Negative for headaches.  Hematological: Does not bruise/bleed easily.  Psychiatric/Behavioral: Negative for dysphoric mood. The patient is not nervous/anxious.        Objective:   Physical Exam Overweight male in no acute distress Nose without purulence or discharge noted No skin breakdown or pressure necrosis from the CPAP mask Neck without lymphadenopathy or thyromegaly Lower extremities without edema, no cyanosis Alert and oriented, does not appear to be sleepy, moves all 4 extremities.       Assessment & Plan:

## 2013-03-25 NOTE — Patient Instructions (Addendum)
Will increase auto pressure to 7-20cm Work on weight loss followup with me in one year if doing well.

## 2013-03-25 NOTE — Telephone Encounter (Signed)
Pt is requesting to switch back to Dr. Posey Rea from Dr. [place] Martes due to chang in job location.  Pt used to see Dr. Posey Rea.  LOV with Dr. Posey Rea was in 2012.

## 2013-03-27 NOTE — Telephone Encounter (Signed)
Will this be ok? 

## 2013-03-27 NOTE — Telephone Encounter (Signed)
Yes ok with me

## 2013-03-28 NOTE — Telephone Encounter (Signed)
Pt is aware and made an appt for Friday Aug 15.

## 2013-03-29 ENCOUNTER — Encounter: Payer: Self-pay | Admitting: Internal Medicine

## 2013-03-29 ENCOUNTER — Ambulatory Visit (INDEPENDENT_AMBULATORY_CARE_PROVIDER_SITE_OTHER): Admitting: Internal Medicine

## 2013-03-29 VITALS — BP 162/100 | HR 84 | Temp 99.0°F | Wt 211.0 lb

## 2013-03-29 DIAGNOSIS — F3289 Other specified depressive episodes: Secondary | ICD-10-CM

## 2013-03-29 DIAGNOSIS — E785 Hyperlipidemia, unspecified: Secondary | ICD-10-CM

## 2013-03-29 DIAGNOSIS — F329 Major depressive disorder, single episode, unspecified: Secondary | ICD-10-CM

## 2013-03-29 DIAGNOSIS — Z638 Other specified problems related to primary support group: Secondary | ICD-10-CM

## 2013-03-29 DIAGNOSIS — Z6379 Other stressful life events affecting family and household: Secondary | ICD-10-CM

## 2013-03-29 DIAGNOSIS — M1990 Unspecified osteoarthritis, unspecified site: Secondary | ICD-10-CM

## 2013-03-29 MED ORDER — AZITHROMYCIN 250 MG PO TABS: ORAL_TABLET | ORAL | Status: DC

## 2013-03-29 MED ORDER — HYDROCODONE-ACETAMINOPHEN 5-325 MG PO TABS: 1.0000 | ORAL_TABLET | Freq: Two times a day (BID) | ORAL | Status: DC | PRN

## 2013-03-29 MED ORDER — CLONAZEPAM 1 MG PO TABS: 0.5000 mg | ORAL_TABLET | Freq: Two times a day (BID) | ORAL | Status: DC | PRN

## 2013-03-29 MED ORDER — OMEPRAZOLE 40 MG PO CPDR: DELAYED_RELEASE_CAPSULE | ORAL | Status: DC

## 2013-03-29 MED ORDER — DICLOFENAC-MISOPROSTOL 75-0.2 MG PO TBEC: 1.0000 | DELAYED_RELEASE_TABLET | Freq: Every day | ORAL | Status: DC

## 2013-03-29 NOTE — Progress Notes (Signed)
   Subjective:    Sore Throat  This is a new problem. Pertinent negatives include no congestion, coughing, diarrhea, neck pain or trouble swallowing.  Sinusitis Pertinent negatives include no congestion, coughing, neck pain, sneezing or sore throat.   [name] [name] is separated now, getting a divorce. He is seeing Dr Jennelle Human for ADD. He is seeing Dr Dellia Cloud.  Son [name] [name] is 59 yo  The patient is here to follow up on chronic B soulder pain, depression, anxiety, headaches and chronic moderate fibromyalgia symptoms controlled with medicines, diet and exercise.    Review of Systems  Constitutional: Positive for fatigue. Negative for appetite change and unexpected weight change.  HENT: Negative for nosebleeds, congestion, sore throat, sneezing, trouble swallowing and neck pain.   Eyes: Negative for itching and visual disturbance.  Respiratory: Negative for cough.   Cardiovascular: Negative for chest pain, palpitations and leg swelling.  Gastrointestinal: Negative for nausea, diarrhea, blood in stool and abdominal distention.  Genitourinary: Negative for frequency and hematuria.  Musculoskeletal: Negative for back pain, joint swelling and gait problem.  Skin: Negative for rash.  Neurological: Negative for dizziness, tremors, speech difficulty and weakness.  Psychiatric/Behavioral: Positive for sleep disturbance. Negative for suicidal ideas, dysphoric mood and agitation. The patient is nervous/anxious.        Objective:   Physical Exam  Constitutional: He is oriented to person, place, and time. He appears well-developed.  HENT:  Mouth/Throat: Oropharynx is clear and moist.  Eyes: Conjunctivae are normal. Pupils are equal, round, and reactive to light.  Neck: Normal range of motion. No JVD present. No thyromegaly present.  Cardiovascular: Normal rate, regular rhythm, normal heart sounds and intact distal pulses.  Exam reveals no gallop and no friction rub.   No murmur heard. Pulmonary/Chest:  Effort normal and breath sounds normal. No respiratory distress. He has no wheezes. He has no rales. He exhibits no tenderness.  Abdominal: Soft. Bowel sounds are normal. He exhibits no distension and no mass. There is no tenderness. There is no rebound and no guarding.  Musculoskeletal: Normal range of motion. He exhibits tenderness (shoulders are tender B L>>R). He exhibits no edema.  Lymphadenopathy:    He has no cervical adenopathy.  Neurological: He is alert and oriented to person, place, and time. He has normal reflexes. No cranial nerve deficit. He exhibits normal muscle tone. Coordination normal.  Skin: Skin is warm and dry. No rash noted.  Psychiatric: He has a normal mood and affect. His behavior is normal. Judgment and thought content normal.  Not suicidal and homicidal    Assessment & Plan:

## 2013-03-31 ENCOUNTER — Encounter: Payer: Self-pay | Admitting: Internal Medicine

## 2013-03-31 DIAGNOSIS — Z6379 Other stressful life events affecting family and household: Secondary | ICD-10-CM | POA: Insufficient documentation

## 2013-03-31 NOTE — Assessment & Plan Note (Signed)
Continue with current prescription therapy as reflected on the Med list.  

## 2013-03-31 NOTE — Assessment & Plan Note (Signed)
8/14 Forbes is separated: [name] left

## 2013-03-31 NOTE — Assessment & Plan Note (Signed)
Chronic Dr Cottle, Dr Gutterman 

## 2013-04-01 ENCOUNTER — Telehealth: Payer: Self-pay | Admitting: Pulmonary Disease

## 2013-04-01 NOTE — Telephone Encounter (Signed)
Pt states that pt CPAP pressure was supposed to be increased x 1 week ago. Order sent but never processed by [facility].  Ordered placed 03/25/13 Order faxed to [facility] in [place] to change auto pressure to 7-20 cm. Rhonda J Cobb  Will need to cal in the AM to figure out why pt pressure never changed!

## 2013-04-03 NOTE — Telephone Encounter (Signed)
Called [facility] in [place], spoke with [place].  Was advised they did receive the order on 8/11.  Was advised Merla would be the one scheduling this with pt.  She is currently out of the office.  Lurena Joiner will have Merla call our office back and pt to f/u on this.  I have lmomtcb to inform pt of above.

## 2013-04-04 NOTE — Telephone Encounter (Signed)
I LMTCBx2 on home number. I called the cell number but persone that answered states this is the wrong # so I deleted this from file. Carron Curie, CMA

## 2013-04-04 NOTE — Telephone Encounter (Signed)
I spoke with Nadine Counts from [facility] and he states he has been trying to get in contact with the pt in regards to his CPAP and has not had any success with reaching him. He states he will continue to try  To reach the pt. We LMTCB today for the pt so I will leave message open to see if he returns our call.Carron Curie, CMA

## 2013-04-08 NOTE — Telephone Encounter (Signed)
LMTCBx3. Katreena Schupp, CMA  

## 2013-04-10 NOTE — Telephone Encounter (Signed)
LMTCB x 1 

## 2013-04-11 ENCOUNTER — Telehealth: Payer: Self-pay | Admitting: *Deleted

## 2013-04-11 NOTE — Telephone Encounter (Signed)
Pt contacted via Mychart d/t not be able to contact.

## 2013-04-11 NOTE — Telephone Encounter (Signed)
Unable to reach patient-- Letter mailed to contact our office. Will close message in the meantime.

## 2013-04-11 NOTE — Telephone Encounter (Signed)
LMTCB

## 2013-04-19 ENCOUNTER — Telehealth: Payer: Self-pay | Admitting: Pulmonary Disease

## 2013-04-19 NOTE — Telephone Encounter (Signed)
LMOMTCB x 1 

## 2013-04-22 NOTE — Telephone Encounter (Signed)
lmomtcb for pt 

## 2013-04-23 NOTE — Telephone Encounter (Signed)
ATC patient at work number provided. Unable to get in contact with patient Attempt to reach patient at home number, no answer [facility]

## 2013-04-24 NOTE — Telephone Encounter (Signed)
Spoke with pt .  Adjustments were made to his cpap and everything is working great now.

## 2013-06-20 ENCOUNTER — Other Ambulatory Visit: Payer: Self-pay

## 2013-07-05 ENCOUNTER — Encounter: Payer: Self-pay | Admitting: Internal Medicine

## 2013-07-05 ENCOUNTER — Ambulatory Visit (INDEPENDENT_AMBULATORY_CARE_PROVIDER_SITE_OTHER): Admitting: Internal Medicine

## 2013-07-05 VITALS — BP 148/102 | HR 80 | Temp 97.6°F | Resp 16 | Wt 206.0 lb

## 2013-07-05 DIAGNOSIS — Z23 Encounter for immunization: Secondary | ICD-10-CM

## 2013-07-05 DIAGNOSIS — Z638 Other specified problems related to primary support group: Secondary | ICD-10-CM

## 2013-07-05 DIAGNOSIS — E785 Hyperlipidemia, unspecified: Secondary | ICD-10-CM

## 2013-07-05 DIAGNOSIS — Z6379 Other stressful life events affecting family and household: Secondary | ICD-10-CM

## 2013-07-05 DIAGNOSIS — F319 Bipolar disorder, unspecified: Secondary | ICD-10-CM

## 2013-07-05 DIAGNOSIS — F411 Generalized anxiety disorder: Secondary | ICD-10-CM

## 2013-07-05 MED ORDER — HYDROCODONE-ACETAMINOPHEN 5-325 MG PO TABS: 1.0000 | ORAL_TABLET | Freq: Two times a day (BID) | ORAL | Status: DC | PRN

## 2013-07-05 MED ORDER — FLUTICASONE PROPIONATE 50 MCG/ACT NA SUSP: 1.0000 | Freq: Every day | NASAL | Status: DC

## 2013-07-05 MED ORDER — DICLOFENAC-MISOPROSTOL 75-0.2 MG PO TBEC: 1.0000 | DELAYED_RELEASE_TABLET | Freq: Every day | ORAL | Status: DC

## 2013-07-05 MED ORDER — CYCLOBENZAPRINE HCL 10 MG PO TABS: 10.0000 mg | ORAL_TABLET | Freq: Two times a day (BID) | ORAL | Status: DC | PRN

## 2013-07-05 NOTE — Progress Notes (Signed)
   Subjective:     HPI  The patient is here to follow up on chronic B soulder pain, depression, anxiety, headaches and chronic moderate fibromyalgia symptoms controlled with medicines, diet and exercise. He is separated. Son Jonny Ruiz lives w/Anquan...  BP Readings from Last 3 Encounters:  07/05/13 148/102  03/29/13 162/100  03/25/13 150/90   Wt Readings from Last 3 Encounters:  07/05/13 206 lb (93.441 kg)  03/29/13 211 lb (95.709 kg)  03/25/13 211 lb 12.8 oz (96.072 kg)      Review of Systems  Constitutional: Positive for fatigue. Negative for appetite change and unexpected weight change.  HENT: Negative for congestion, nosebleeds, sneezing, sore throat and trouble swallowing.   Eyes: Negative for itching and visual disturbance.  Respiratory: Negative for cough.   Cardiovascular: Negative for chest pain, palpitations and leg swelling.  Gastrointestinal: Negative for nausea, diarrhea, blood in stool and abdominal distention.  Genitourinary: Negative for frequency and hematuria.  Musculoskeletal: Negative for back pain, gait problem, joint swelling and neck pain.  Skin: Negative for rash.  Neurological: Negative for dizziness, tremors, speech difficulty and weakness.  Psychiatric/Behavioral: Positive for sleep disturbance. Negative for suicidal ideas, dysphoric mood and agitation. The patient is nervous/anxious.        Objective:   Physical Exam  Constitutional: He is oriented to person, place, and time. He appears well-developed.  HENT:  Mouth/Throat: Oropharynx is clear and moist.  Eyes: Conjunctivae are normal. Pupils are equal, round, and reactive to light.  Neck: Normal range of motion. No JVD present. No thyromegaly present.  Cardiovascular: Normal rate, regular rhythm, normal heart sounds and intact distal pulses.  Exam reveals no gallop and no friction rub.   No murmur heard. Pulmonary/Chest: Effort normal and breath sounds normal. No respiratory distress. He has no  wheezes. He has no rales. He exhibits no tenderness.  Abdominal: Soft. Bowel sounds are normal. He exhibits no distension and no mass. There is no tenderness. There is no rebound and no guarding.  Musculoskeletal: Normal range of motion. He exhibits tenderness (shoulders are tender B L>>R). He exhibits no edema.  Lymphadenopathy:    He has no cervical adenopathy.  Neurological: He is alert and oriented to person, place, and time. He has normal reflexes. No cranial nerve deficit. He exhibits normal muscle tone. Coordination normal.  Skin: Skin is warm and dry. No rash noted.  Psychiatric: He has a normal mood and affect. His behavior is normal. Judgment and thought content normal.          Assessment & Plan:

## 2013-07-05 NOTE — Progress Notes (Signed)
Pre visit review using our clinic review tool, if applicable. No additional management support is needed unless otherwise documented below in the visit note. 

## 2013-07-05 NOTE — Assessment & Plan Note (Signed)
Continue with current prn prescription therapy as reflected on the Med list.  

## 2013-07-06 NOTE — Assessment & Plan Note (Signed)
Continue with current prescription therapy as reflected on the Med list.  

## 2013-07-06 NOTE — Assessment & Plan Note (Signed)
Discussed.

## 2013-07-06 NOTE — Assessment & Plan Note (Signed)
Doing better.   

## 2013-07-10 ENCOUNTER — Ambulatory Visit: Admitting: Internal Medicine

## 2013-09-05 ENCOUNTER — Ambulatory Visit (INDEPENDENT_AMBULATORY_CARE_PROVIDER_SITE_OTHER): Admitting: *Deleted

## 2013-09-05 DIAGNOSIS — Z23 Encounter for immunization: Secondary | ICD-10-CM

## 2013-10-12 ENCOUNTER — Encounter (HOSPITAL_BASED_OUTPATIENT_CLINIC_OR_DEPARTMENT_OTHER): Payer: Self-pay | Admitting: Emergency Medicine

## 2013-10-12 ENCOUNTER — Emergency Department (HOSPITAL_BASED_OUTPATIENT_CLINIC_OR_DEPARTMENT_OTHER)
Admission: EM | Admit: 2013-10-12 | Discharge: 2013-10-12 | Disposition: A | Discharge status: Home / Self Care | Attending: Emergency Medicine | Admitting: Emergency Medicine

## 2013-10-12 DIAGNOSIS — Z7982 Long term (current) use of aspirin: Secondary | ICD-10-CM | POA: Insufficient documentation

## 2013-10-12 DIAGNOSIS — Z79899 Other long term (current) drug therapy: Secondary | ICD-10-CM | POA: Insufficient documentation

## 2013-10-12 DIAGNOSIS — L03818 Cellulitis of other sites: Secondary | ICD-10-CM

## 2013-10-12 DIAGNOSIS — L0390 Cellulitis, unspecified: Secondary | ICD-10-CM

## 2013-10-12 DIAGNOSIS — F411 Generalized anxiety disorder: Secondary | ICD-10-CM | POA: Insufficient documentation

## 2013-10-12 DIAGNOSIS — L0100 Impetigo, unspecified: Secondary | ICD-10-CM | POA: Insufficient documentation

## 2013-10-12 DIAGNOSIS — IMO0002 Reserved for concepts with insufficient information to code with codable children: Secondary | ICD-10-CM | POA: Insufficient documentation

## 2013-10-12 DIAGNOSIS — F329 Major depressive disorder, single episode, unspecified: Secondary | ICD-10-CM | POA: Insufficient documentation

## 2013-10-12 DIAGNOSIS — G4730 Sleep apnea, unspecified: Secondary | ICD-10-CM | POA: Insufficient documentation

## 2013-10-12 DIAGNOSIS — L02818 Cutaneous abscess of other sites: Secondary | ICD-10-CM | POA: Insufficient documentation

## 2013-10-12 DIAGNOSIS — M19019 Primary osteoarthritis, unspecified shoulder: Secondary | ICD-10-CM | POA: Insufficient documentation

## 2013-10-12 DIAGNOSIS — F988 Other specified behavioral and emotional disorders with onset usually occurring in childhood and adolescence: Secondary | ICD-10-CM | POA: Insufficient documentation

## 2013-10-12 DIAGNOSIS — F3289 Other specified depressive episodes: Secondary | ICD-10-CM | POA: Insufficient documentation

## 2013-10-12 DIAGNOSIS — K219 Gastro-esophageal reflux disease without esophagitis: Secondary | ICD-10-CM | POA: Insufficient documentation

## 2013-10-12 MED ORDER — MUPIROCIN CALCIUM 2 % EX CREA: 1.0000 "application " | TOPICAL_CREAM | Freq: Two times a day (BID) | CUTANEOUS | Status: DC

## 2013-10-12 MED ORDER — DOXYCYCLINE HYCLATE 100 MG PO CAPS: 100.0000 mg | ORAL_CAPSULE | Freq: Two times a day (BID) | ORAL | Status: DC

## 2013-10-12 NOTE — ED Notes (Signed)
Reports a tick bite five weeks ago.  Does not believe he removed the whole tick.  C/o generalized body aches, facial pain, unsure of fever.

## 2013-10-12 NOTE — Discharge Instructions (Signed)
Impetigo Impetigo is an infection of the skin, most common in babies and children.  CAUSES  It is caused by staphylococcal or streptococcal germs (bacteria). Impetigo can start after any damage to the skin. The damage to the skin may be from things like:   Chickenpox.  Scrapes.  Scratches.  Insect bites (common when children scratch the bite).  Cuts.  Nail biting or chewing. Impetigo is contagious. It can be spread from one person to another. Avoid close skin contact, or sharing towels or clothing. SYMPTOMS  Impetigo usually starts out as small blisters or pustules. Then they turn into tiny yellow-crusted sores (lesions).  There may also be:  Large blisters.  Itching or pain.  Pus.  Swollen lymph glands. With scratching, irritation, or non-treatment, these small areas may get larger. Scratching can cause the germs to get under the fingernails; then scratching another part of the skin can cause the infection to be spread there. DIAGNOSIS  Diagnosis of impetigo is usually made by a physical exam. A skin culture (test to grow bacteria) may be done to prove the diagnosis or to help decide the best treatment.  TREATMENT  Mild impetigo can be treated with prescription antibiotic cream. Oral antibiotic medicine may be used in more severe cases. Medicines for itching may be used. HOME CARE INSTRUCTIONS   To avoid spreading impetigo to other body areas:  Keep fingernails short and clean.  Avoid scratching.  Cover infected areas if necessary to keep from scratching.  Gently wash the infected areas with antibiotic soap and water.  Soak crusted areas in warm soapy water using antibiotic soap.  Gently rub the areas to remove crusts. Do not scrub.  Wash hands often to avoid spread this infection.  Keep children with impetigo home from school or daycare until they have used an antibiotic cream for 48 hours (2 days) or oral antibiotic medicine for 24 hours (1 day), and their skin  shows significant improvement.  Children may attend school or daycare if they only have a few sores and if the sores can be covered by a bandage or clothing. SEEK MEDICAL CARE IF:   More blisters or sores show up despite treatment.  Other family members get sores.  Rash is not improving after 48 hours (2 days) of treatment. SEEK IMMEDIATE MEDICAL CARE IF:   You see spreading redness or swelling of the skin around the sores.  You see red streaks coming from the sores.  Your child develops a fever of 100.4 F (37.2 C) or higher.  Your child develops a sore throat.  Your child is acting ill (lethargic, sick to their stomach). Document Released: 07/29/2000 Document Revised: 10/24/2011 Document Reviewed: 05/28/2008 [facility] Patient Information 2014 [place].  Cellulitis Cellulitis is an infection of the skin and the tissue under the skin. The infected area is usually red and tender. This happens most often in the arms and lower legs. HOME CARE   Take your antibiotic medicine as told. Finish the medicine even if you start to feel better.  Keep the infected arm or leg raised (elevated).  Put a warm cloth on the area up to 4 times per day.  Only take medicines as told by your doctor.  Keep all doctor visits as told. GET HELP RIGHT AWAY IF:   You have a fever.  You feel very sleepy.  You throw up (vomit) or have watery poop (diarrhea).  You feel sick and have muscle aches and pains.  You see red streaks  on the skin coming from the infected area.  Your red area gets bigger or turns a dark color.  Your bone or joint under the infected area is painful after the skin heals.  Your infection comes back in the same area or different area.  You have a puffy (swollen) bump in the infected area.  You have new symptoms. MAKE SURE YOU:   Understand these instructions.  Will watch your condition.  Will get help right away if you are not doing well or get  worse. Document Released: 01/18/2008 Document Revised: 01/31/2012 Document Reviewed: 10/17/2011 [facility] Patient Information 2014 [place], [place].

## 2013-10-12 NOTE — ED Provider Notes (Signed)
CSN: [id number]     Arrival date & time 10/12/13  1514 History   First MD Initiated Contact with Patient 10/12/13 1529     Chief Complaint  Patient presents with  . Tick Removal      HPI  Patient presents with area of the scalp that is red and painful. He is concerned because he states he had taken he removed 5 weeks ago. No rash. No fevers. No headache. No stiff neck. Red irritated scalp.  Past Medical History  Diagnosis Date  . GERD (gastroesophageal reflux disease)   . Arthritis     shoulders  . Sleep apnea     uses CPAP nightly  . ADD (attention deficit disorder)   . Depression   . Anxiety   . Sciatic nerve pain   . Shoulder pain 07/2011    PASTA tear teres minor left shoulder   Past Surgical History  Procedure Laterality Date  . Appendectomy    . Shoulder surgery  08/21/2003 - left    1995 - right  . Prostate surgery  10/01/2008    transurethral incision prostate  . Hernia repair      inguinal  . Nasal septoplasty w/ turbinoplasty  01/04/2007    inferior turb. reduction  . Ulnar nerve repair  03/10/2006    decompression left ulnar nerve  . Ganglion cyst excision      right elbow, left ankle - separate surgeries  . Lasik    . Left shoulder surgery  2013  . Vasectomy    . Transurethral resection of prostate     Family History  Problem Relation Age of Onset  . Lung cancer Father   . Cancer Father     lung ca  . Diabetes Other   . Asthma Other   . Coronary artery disease Other    History  Substance Use Topics  . Smoking status: Never Smoker   . Smokeless tobacco: Never Used  . Alcohol Use: Yes     Comment: occasionally    Review of Systems  Constitutional: Negative for fever, chills, diaphoresis, appetite change and fatigue.  HENT: Negative for mouth sores, sore throat and trouble swallowing.   Eyes: Negative for visual disturbance.  Respiratory: Negative for cough, chest tightness, shortness of breath and wheezing.   Cardiovascular: Negative for chest  pain.  Gastrointestinal: Negative for nausea, vomiting, abdominal pain, diarrhea and abdominal distention.  Endocrine: Negative for polydipsia, polyphagia and polyuria.  Genitourinary: Negative for dysuria, frequency and hematuria.  Musculoskeletal: Negative for gait problem.  Skin: Positive for rash. Negative for color change and pallor.  Neurological: Negative for dizziness, syncope, light-headedness and headaches.  Hematological: Does not bruise/bleed easily.  Psychiatric/Behavioral: Negative for behavioral problems and confusion.      Allergies  Review of patient's allergies indicates no known allergies.  Home Medications   Current Outpatient Rx  Name  Route  Sig  Dispense  Refill  . aspirin 81 MG tablet   Oral   Take 81 mg by mouth daily.          . clonazePAM (KLONOPIN) 1 MG tablet   Oral   Take 0.5 tablets (0.5 mg total) by mouth 2 (two) times daily as needed.   180 tablet   1   . cyclobenzaprine (FLEXERIL) 10 MG tablet   Oral   Take 1 tablet (10 mg total) by mouth 3 times/day as needed-between meals & bedtime for muscle spasms.   90 tablet   1   .  Diclofenac-Misoprostol 75-0.2 MG TBEC   Oral   Take 1 tablet by mouth daily.   90 tablet   3   . doxycycline (VIBRAMYCIN) 100 MG capsule   Oral   Take 1 capsule (100 mg total) by mouth 2 (two) times daily.   20 capsule   0   . fluticasone (FLONASE) 50 MCG/ACT nasal spray   Each Nare   Place 1 spray into both nostrils daily.   48 g   2   . HYDROcodone-acetaminophen (NORCO/VICODIN) 5-325 MG per tablet   Oral   Take 1 tablet by mouth 2 (two) times daily as needed.   100 tablet   0   . methylphenidate (CONCERTA) 36 MG CR tablet   Oral   Take 36 mg by mouth every morning.         . methylphenidate (RITALIN) 10 MG tablet      One by mouth in the evenings as needed         . mupirocin cream (BACTROBAN) 2 %   Topical   Apply 1 application topically 2 (two) times daily.   15 g   0   . omeprazole  (PRILOSEC) 40 MG capsule      TAKE 1 CAPSULE DAILY   90 capsule   3   . UNABLE TO FIND      Med Name: Glucosamine/Chrondroitin 1500/200 mg  1 QD  (750/100 BID)          BP 168/96  Pulse 96  Temp(Src) 99.2 F (37.3 C) (Oral)  Resp 22  Ht 5\' 9"  (1.753 m)  Wt 200 lb (90.719 kg)  BMI 29.52 kg/m2  SpO2 96% Physical Exam  Constitutional: He is oriented to person, place, and time. He appears well-developed and well-nourished. No distress.  HENT:  Head: Normocephalic.    No enlarged lymph node chains around the head or neck  Eyes: Conjunctivae are normal. Pupils are equal, round, and reactive to light. No scleral icterus.  Neck: Normal range of motion. Neck supple. No thyromegaly present.  Cardiovascular: Normal rate and regular rhythm.  Exam reveals no gallop and no friction rub.   No murmur heard. Pulmonary/Chest: Effort normal and breath sounds normal. No respiratory distress. He has no wheezes. He has no rales.  Abdominal: Soft. Bowel sounds are normal. He exhibits no distension. There is no tenderness. There is no rebound.  Musculoskeletal: Normal range of motion.  Neurological: He is alert and oriented to person, place, and time.  Skin: Skin is warm and dry. No rash noted.  No skin rash  Psychiatric: He has a normal mood and affect. His behavior is normal.    ED Course  Procedures (including critical care time) Labs Review Labs Reviewed - No data to display Imaging Review No results found.   EKG Interpretation None      MDM   Final diagnoses:  Impetigo  Cellulitis   Patient has area of erythema. No palpable fluctuance. There is erythema with overlying ruptured vesicles. This may be impetigo. This may be simple cellulitis. I do not think this is retained foreign the. Plan will be topical mupirocin and doxycycline by mouth.    Tanna Furry, MD 10/12/13 [phone]

## 2013-10-22 ENCOUNTER — Other Ambulatory Visit: Payer: Self-pay | Admitting: Family Medicine

## 2013-11-12 ENCOUNTER — Other Ambulatory Visit: Payer: Self-pay | Admitting: Orthopedic Surgery

## 2013-11-14 ENCOUNTER — Encounter (HOSPITAL_BASED_OUTPATIENT_CLINIC_OR_DEPARTMENT_OTHER): Payer: Self-pay | Admitting: *Deleted

## 2013-11-14 NOTE — Progress Notes (Signed)
No cardiac or resp problems-does use a cpap-is separated-will bring all meds and cpap and overnight bag to stay

## 2013-11-18 NOTE — H&P (Signed)
[name] [name] is an 58 y.o. male.   Chief Complaint: c/o chronic pain and weakness right shoulder HPI: [name] [name] returns for follow-up examination of his right shoulder.  He has noted some pain when he sleeps at night and says his shoulder freezes in place when it is in an abducted position for a long period of time or at times when he is in an awkward position of extension and rotation.  He has had no recent injury.  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  He has had occasional sense of numbness in his arm after prolonged abduction.  He has trouble sleeping on his right side.  He was using diclofenac, but has weaned from his medications.    Past Medical History  Diagnosis Date  . GERD (gastroesophageal reflux disease)   . Arthritis     shoulders  . ADD (attention deficit disorder)   . Depression   . Anxiety   . Sciatic nerve pain   . Shoulder pain 07/2011    PASTA tear teres minor left shoulder  . Wears glasses   . Sleep apnea     uses CPAP nightly    Past Surgical History  Procedure Laterality Date  . Appendectomy    . Shoulder surgery  08/21/2003 - left    1995 - right  . Prostate surgery  10/01/2008    transurethral incision prostate  . Hernia repair      inguinal  . Nasal septoplasty w/ turbinoplasty  01/04/2007    inferior turb. reduction  . Ulnar nerve repair  03/10/2006    decompression left ulnar nerve  . Ganglion cyst excision      right elbow, left ankle - separate surgeries  . Lasik    . Left shoulder surgery  2013  . Vasectomy    . Transurethral resection of prostate      Family History  Problem Relation Age of Onset  . Lung cancer Father   . Cancer Father     lung ca  . Diabetes Other   . Asthma Other   . Coronary artery disease Other    Social History:  reports that he has never smoked. He has never used smokeless tobacco. He reports that he drinks alcohol. He reports that he does not use illicit drugs.  Allergies: No Known Allergies  No prescriptions prior to  admission    No results found for this or any previous visit (from the past 48 hour(s)).  No results found.   Pertinent items are noted in HPI.  Height 5\' 9"  (1.753 m), weight 90.719 kg (200 lb).  General appearance: alert Head: Normocephalic, without obvious abnormality Neck: supple, symmetrical, trachea midline Resp: clear to auscultation bilaterally Cardio: regular rate and rhythm GI: normal findings: bowel sounds normal Extremities: .  His shoulder range of motion reveals combined elevation right shoulder 175, left shoulder 175, external rotation 90 degrees abduction, 90 right and 90 left.  He can internally rotate to L-1 right and T-10 left.   He has painful push-off test on the right and is tighter in internal rotation on the right than left.  He has normal push-off test on the left.  He has symmetrical internal rotation, external rotation at neutral.  He has 5/5 strength.    X-rays of his shoulder demonstrate squaring of the humeral head and flattening of the glenoid.  He has postoperative changes at the distal clavicle from prior subacromial decompression distal clavicle resection.    His MRI was  obtained at [facility] on 10-14-13. His films were reviewed by the attending radiologist and reported as extensive partial thickness tearing of the supraspinatus and infraspinatus. His films document really a 95% PASTA tear as described by Cala Bradford. This is substantial mechanical tear. There is also a degree of SLAP injury to the superior labrum at the biceps insertion.   Pulses: 2+ and symmetric Skin: normal Neurologic: Grossly normal    Assessment/Plan Impression: Right shoulder RC impingement with RC tear rule out biceps tendinopathy.  Plan: To the OR for right SA with SAD/DCR and RC repair as needed.  Possible biceps tenotomy. The procedure, risks,benefits and post-op course were discussed with the patient at length and they were in agreement with the plan.  DASNOIT,Latalia Etzler  J 11/18/2013, 4:27 PM  H&P documentation: 11/19/2013  -History and Physical Reviewed  -Patient has been re-examined  -No change in the plan of care  Cammie Sickle, MD

## 2013-11-19 ENCOUNTER — Ambulatory Visit (HOSPITAL_BASED_OUTPATIENT_CLINIC_OR_DEPARTMENT_OTHER): Admitting: Anesthesiology

## 2013-11-19 ENCOUNTER — Encounter (HOSPITAL_BASED_OUTPATIENT_CLINIC_OR_DEPARTMENT_OTHER): Payer: Self-pay | Admitting: Anesthesiology

## 2013-11-19 ENCOUNTER — Encounter (HOSPITAL_BASED_OUTPATIENT_CLINIC_OR_DEPARTMENT_OTHER): Admitting: Anesthesiology

## 2013-11-19 ENCOUNTER — Encounter (HOSPITAL_BASED_OUTPATIENT_CLINIC_OR_DEPARTMENT_OTHER)
Admission: RE | Disposition: A | Discharge status: Home / Self Care | Payer: Self-pay | Source: Ambulatory Visit | Attending: Orthopedic Surgery

## 2013-11-19 ENCOUNTER — Ambulatory Visit (HOSPITAL_BASED_OUTPATIENT_CLINIC_OR_DEPARTMENT_OTHER)
Admission: RE | Admit: 2013-11-19 | Discharge: 2013-11-20 | Disposition: A | Discharge status: Home / Self Care | Source: Ambulatory Visit | Attending: Orthopedic Surgery | Admitting: Orthopedic Surgery

## 2013-11-19 DIAGNOSIS — M9420 Chondromalacia, unspecified site: Secondary | ICD-10-CM | POA: Insufficient documentation

## 2013-11-19 DIAGNOSIS — F329 Major depressive disorder, single episode, unspecified: Secondary | ICD-10-CM | POA: Insufficient documentation

## 2013-11-19 DIAGNOSIS — K219 Gastro-esophageal reflux disease without esophagitis: Secondary | ICD-10-CM | POA: Diagnosis not present

## 2013-11-19 DIAGNOSIS — F411 Generalized anxiety disorder: Secondary | ICD-10-CM | POA: Diagnosis not present

## 2013-11-19 DIAGNOSIS — F3289 Other specified depressive episodes: Secondary | ICD-10-CM | POA: Insufficient documentation

## 2013-11-19 DIAGNOSIS — S46819A Strain of other muscles, fascia and tendons at shoulder and upper arm level, unspecified arm, initial encounter: Secondary | ICD-10-CM | POA: Diagnosis not present

## 2013-11-19 DIAGNOSIS — X58XXXA Exposure to other specified factors, initial encounter: Secondary | ICD-10-CM | POA: Insufficient documentation

## 2013-11-19 DIAGNOSIS — S43439A Superior glenoid labrum lesion of unspecified shoulder, initial encounter: Secondary | ICD-10-CM | POA: Diagnosis present

## 2013-11-19 DIAGNOSIS — F988 Other specified behavioral and emotional disorders with onset usually occurring in childhood and adolescence: Secondary | ICD-10-CM | POA: Insufficient documentation

## 2013-11-19 DIAGNOSIS — S46919A Strain of unspecified muscle, fascia and tendon at shoulder and upper arm level, unspecified arm, initial encounter: Secondary | ICD-10-CM | POA: Diagnosis not present

## 2013-11-19 DIAGNOSIS — G4730 Sleep apnea, unspecified: Secondary | ICD-10-CM | POA: Insufficient documentation

## 2013-11-19 DIAGNOSIS — M24119 Other articular cartilage disorders, unspecified shoulder: Secondary | ICD-10-CM | POA: Diagnosis present

## 2013-11-19 HISTORY — PX: SHOULDER ARTHROSCOPY WITH SUBACROMIAL DECOMPRESSION: SHX5684

## 2013-11-19 LAB — POCT HEMOGLOBIN-HEMACUE: HEMOGLOBIN: 15.7 g/dL (ref 13.0–17.0)

## 2013-11-19 SURGERY — SHOULDER ARTHROSCOPY WITH SUBACROMIAL DECOMPRESSION
Anesthesia: General | Site: Shoulder | Laterality: Right

## 2013-11-19 MED ORDER — SODIUM CHLORIDE 0.9 % IR SOLN
Status: DC | PRN
  Administered 2013-11-19: 12000 mL

## 2013-11-19 MED ORDER — ONDANSETRON HCL 4 MG/2ML IJ SOLN: 4.0000 mg | Freq: Once | INTRAMUSCULAR | Status: DC | PRN

## 2013-11-19 MED ORDER — EPHEDRINE SULFATE 50 MG/ML IJ SOLN
INTRAMUSCULAR | Status: DC | PRN
  Administered 2013-11-19: 10 mg via INTRAVENOUS

## 2013-11-19 MED ORDER — FENTANYL CITRATE 0.05 MG/ML IJ SOLN
INTRAMUSCULAR | Status: AC
  Filled 2013-11-19: qty 2

## 2013-11-19 MED ORDER — LIDOCAINE HCL 4 % MT SOLN
OROMUCOSAL | Status: DC | PRN
  Administered 2013-11-19: 4 mL via TOPICAL

## 2013-11-19 MED ORDER — SUCCINYLCHOLINE CHLORIDE 20 MG/ML IJ SOLN
INTRAMUSCULAR | Status: DC | PRN
  Administered 2013-11-19: 100 mg via INTRAVENOUS

## 2013-11-19 MED ORDER — PANTOPRAZOLE SODIUM 40 MG PO TBEC: 40.0000 mg | DELAYED_RELEASE_TABLET | Freq: Every day | ORAL | Status: DC

## 2013-11-19 MED ORDER — OXYCODONE HCL 5 MG/5ML PO SOLN: 5.0000 mg | Freq: Once | ORAL | Status: DC | PRN

## 2013-11-19 MED ORDER — LIDOCAINE HCL (CARDIAC) 20 MG/ML IV SOLN
INTRAVENOUS | Status: DC | PRN
  Administered 2013-11-19: 80 mg via INTRAVENOUS

## 2013-11-19 MED ORDER — HYDROMORPHONE HCL 2 MG PO TABS: ORAL_TABLET | ORAL | Status: DC

## 2013-11-19 MED ORDER — FENTANYL CITRATE 0.05 MG/ML IJ SOLN
50.0000 ug | INTRAMUSCULAR | Status: DC | PRN
  Administered 2013-11-19: 100 ug via INTRAVENOUS

## 2013-11-19 MED ORDER — CLONAZEPAM 0.5 MG PO TABS: 0.5000 mg | ORAL_TABLET | Freq: Two times a day (BID) | ORAL | Status: DC | PRN

## 2013-11-19 MED ORDER — ROPIVACAINE HCL 5 MG/ML IJ SOLN
INTRAMUSCULAR | Status: DC | PRN
  Administered 2013-11-19: 20 mL via PERINEURAL

## 2013-11-19 MED ORDER — ONDANSETRON HCL 4 MG/2ML IJ SOLN: 4.0000 mg | Freq: Four times a day (QID) | INTRAMUSCULAR | Status: DC | PRN

## 2013-11-19 MED ORDER — CEFAZOLIN SODIUM 1-5 GM-% IV SOLN
INTRAVENOUS | Status: AC
  Filled 2013-11-19: qty 100

## 2013-11-19 MED ORDER — LIDOCAINE HCL 2 % IJ SOLN
INTRAMUSCULAR | Status: AC
  Filled 2013-11-19: qty 20

## 2013-11-19 MED ORDER — METOCLOPRAMIDE HCL 5 MG/ML IJ SOLN
5.0000 mg | Freq: Three times a day (TID) | INTRAMUSCULAR | Status: DC | PRN
  Administered 2013-11-19: 10 mg via INTRAVENOUS
  Filled 2013-11-19: qty 2

## 2013-11-19 MED ORDER — CEFAZOLIN SODIUM-DEXTROSE 2-3 GM-% IV SOLR
INTRAVENOUS | Status: AC
Start: 2013-11-19 — End: 2013-11-19
  Filled 2013-11-19: qty 50

## 2013-11-19 MED ORDER — CEFAZOLIN SODIUM-DEXTROSE 2-3 GM-% IV SOLR
2.0000 g | INTRAVENOUS | Status: AC
  Administered 2013-11-19: 2 g via INTRAVENOUS

## 2013-11-19 MED ORDER — GLYCOPYRROLATE 0.2 MG/ML IJ SOLN
INTRAMUSCULAR | Status: DC | PRN
  Administered 2013-11-19 (×2): 0.2 mg via INTRAVENOUS

## 2013-11-19 MED ORDER — CYCLOBENZAPRINE HCL 10 MG PO TABS: 10.0000 mg | ORAL_TABLET | Freq: Two times a day (BID) | ORAL | Status: DC | PRN

## 2013-11-19 MED ORDER — PROPOFOL 10 MG/ML IV BOLUS
INTRAVENOUS | Status: DC | PRN
  Administered 2013-11-19: 200 mg via INTRAVENOUS

## 2013-11-19 MED ORDER — LACTATED RINGERS IV SOLN
INTRAVENOUS | Status: DC
  Administered 2013-11-19 (×2): via INTRAVENOUS

## 2013-11-19 MED ORDER — ONDANSETRON HCL 4 MG/2ML IJ SOLN
INTRAMUSCULAR | Status: DC | PRN
  Administered 2013-11-19: 4 mg via INTRAVENOUS

## 2013-11-19 MED ORDER — FENTANYL CITRATE 0.05 MG/ML IJ SOLN
INTRAMUSCULAR | Status: AC
  Filled 2013-11-19: qty 4

## 2013-11-19 MED ORDER — OXYCODONE-ACETAMINOPHEN 5-325 MG PO TABS
1.0000 | ORAL_TABLET | ORAL | Status: DC | PRN
  Administered 2013-11-19 – 2013-11-20 (×5): 2 via ORAL
  Filled 2013-11-19 (×5): qty 2

## 2013-11-19 MED ORDER — CEFAZOLIN SODIUM 1-5 GM-% IV SOLN
1.0000 g | Freq: Four times a day (QID) | INTRAVENOUS | Status: DC
  Administered 2013-11-19 – 2013-11-20 (×2): 1 g via INTRAVENOUS

## 2013-11-19 MED ORDER — HYDROMORPHONE HCL PF 1 MG/ML IJ SOLN: 0.5000 mg | INTRAMUSCULAR | Status: DC | PRN

## 2013-11-19 MED ORDER — EPINEPHRINE HCL 1 MG/ML IJ SOLN
INTRAMUSCULAR | Status: DC | PRN
  Administered 2013-11-19: .15 mL via SUBCUTANEOUS

## 2013-11-19 MED ORDER — DEXAMETHASONE SODIUM PHOSPHATE 4 MG/ML IJ SOLN
INTRAMUSCULAR | Status: DC | PRN
  Administered 2013-11-19: 10 mg via INTRAVENOUS

## 2013-11-19 MED ORDER — ONDANSETRON HCL 4 MG PO TABS: 4.0000 mg | ORAL_TABLET | Freq: Four times a day (QID) | ORAL | Status: DC | PRN

## 2013-11-19 MED ORDER — CEPHALEXIN 500 MG PO CAPS: 500.0000 mg | ORAL_CAPSULE | Freq: Three times a day (TID) | ORAL | Status: DC

## 2013-11-19 MED ORDER — MIDAZOLAM HCL 2 MG/2ML IJ SOLN
1.0000 mg | INTRAMUSCULAR | Status: DC | PRN
  Administered 2013-11-19: 2 mg via INTRAVENOUS

## 2013-11-19 MED ORDER — MIDAZOLAM HCL 2 MG/2ML IJ SOLN
INTRAMUSCULAR | Status: AC
  Filled 2013-11-19: qty 2

## 2013-11-19 MED ORDER — SODIUM CHLORIDE 0.9 % IV SOLN
INTRAVENOUS | Status: DC
  Administered 2013-11-19: 16:00:00 via INTRAVENOUS

## 2013-11-19 MED ORDER — CHLORHEXIDINE GLUCONATE 4 % EX LIQD
60.0000 mL | Freq: Once | CUTANEOUS | Status: DC
Start: 2013-11-19 — End: 2013-11-19

## 2013-11-19 MED ORDER — HYDROCODONE-ACETAMINOPHEN 5-325 MG PO TABS: 1.0000 | ORAL_TABLET | Freq: Two times a day (BID) | ORAL | Status: DC | PRN

## 2013-11-19 MED ORDER — CEFAZOLIN SODIUM 1-5 GM-% IV SOLN
INTRAVENOUS | Status: AC
  Filled 2013-11-19: qty 50

## 2013-11-19 MED ORDER — FENTANYL CITRATE 0.05 MG/ML IJ SOLN
INTRAMUSCULAR | Status: DC | PRN
  Administered 2013-11-19: 100 ug via INTRAVENOUS

## 2013-11-19 MED ORDER — HYDROMORPHONE HCL PF 1 MG/ML IJ SOLN
INTRAMUSCULAR | Status: AC
  Filled 2013-11-19: qty 1

## 2013-11-19 MED ORDER — METOCLOPRAMIDE HCL 5 MG PO TABS: 5.0000 mg | ORAL_TABLET | Freq: Three times a day (TID) | ORAL | Status: DC | PRN

## 2013-11-19 MED ORDER — OXYCODONE HCL 5 MG PO TABS: 5.0000 mg | ORAL_TABLET | Freq: Once | ORAL | Status: DC | PRN

## 2013-11-19 MED ORDER — HYDROMORPHONE HCL PF 1 MG/ML IJ SOLN
0.2500 mg | INTRAMUSCULAR | Status: DC | PRN
  Administered 2013-11-19 (×2): 0.5 mg via INTRAVENOUS

## 2013-11-19 SURGICAL SUPPLY — 74 items
BANDAGE ADH SHEER 1  50/CT (GAUZE/BANDAGES/DRESSINGS) IMPLANT
BLADE AVERAGE 25X9 (BLADE) ×2 IMPLANT
BLADE CUTTER MENIS 5.5 (BLADE) IMPLANT
BLADE SURG 15 STRL LF DISP TIS (BLADE) ×2 IMPLANT
BLADE SURG 15 STRL SS (BLADE) ×2
BUR EGG 3PK/BX (BURR) IMPLANT
BUR OVAL 6.0 (BURR) ×2 IMPLANT
CANISTER SUCT 3000ML (MISCELLANEOUS) IMPLANT
CANNULA TWIST IN 8.25X7CM (CANNULA) IMPLANT
CLEANER CAUTERY TIP 5X5 PAD (MISCELLANEOUS) IMPLANT
CUTTER MENISCUS  4.2MM (BLADE) ×1
CUTTER MENISCUS 4.2MM (BLADE) ×1 IMPLANT
DECANTER SPIKE VIAL GLASS SM (MISCELLANEOUS) IMPLANT
DRAPE INCISE IOBAN 66X45 STRL (DRAPES) ×2 IMPLANT
DRAPE STERI 35X30 U-POUCH (DRAPES) ×2 IMPLANT
DRAPE SURG 17X23 STRL (DRAPES) ×2 IMPLANT
DRAPE U-SHAPE 47X51 STRL (DRAPES) ×2 IMPLANT
DRAPE U-SHAPE 76X120 STRL (DRAPES) ×4 IMPLANT
DRSG PAD ABDOMINAL 8X10 ST (GAUZE/BANDAGES/DRESSINGS) ×4 IMPLANT
DURAPREP 26ML APPLICATOR (WOUND CARE) ×2 IMPLANT
ELECT REM PT RETURN 9FT ADLT (ELECTROSURGICAL) ×2
ELECTRODE REM PT RTRN 9FT ADLT (ELECTROSURGICAL) ×1 IMPLANT
GLOVE BIO SURGEON STRL SZ 6.5 (GLOVE) ×4 IMPLANT
GLOVE BIOGEL M STRL SZ7.5 (GLOVE) ×4 IMPLANT
GLOVE BIOGEL PI IND STRL 8 (GLOVE) ×2 IMPLANT
GLOVE BIOGEL PI INDICATOR 8 (GLOVE) ×2
GLOVE ECLIPSE 6.5 STRL STRAW (GLOVE) ×8 IMPLANT
GLOVE ORTHO TXT STRL SZ7.5 (GLOVE) ×2 IMPLANT
GOWN STRL REUS W/ TWL LRG LVL3 (GOWN DISPOSABLE) ×4 IMPLANT
GOWN STRL REUS W/TWL LRG LVL3 (GOWN DISPOSABLE) ×4
GOWN STRL REUS W/TWL XL LVL4 (GOWN DISPOSABLE) ×4 IMPLANT
MANIFOLD NEPTUNE II (INSTRUMENTS) ×2 IMPLANT
NDL SUT 6 .5 CRC .975X.05 MAYO (NEEDLE) IMPLANT
NEEDLE MAYO TAPER (NEEDLE)
NEEDLE MINI RC 24MM (NEEDLE) IMPLANT
NEEDLE SCORPION (NEEDLE) ×2 IMPLANT
PACK ARTHROSCOPY DSU (CUSTOM PROCEDURE TRAY) ×2 IMPLANT
PACK BASIN DAY SURGERY FS (CUSTOM PROCEDURE TRAY) ×2 IMPLANT
PAD CLEANER CAUTERY TIP 5X5 (MISCELLANEOUS)
PASSER SUT SWANSON 36MM LOOP (INSTRUMENTS) IMPLANT
PENCIL BUTTON HOLSTER BLD 10FT (ELECTRODE) IMPLANT
SLEEVE SCD COMPRESS KNEE MED (MISCELLANEOUS) ×2 IMPLANT
SLEEVE SURGEON STRL (DRAPES) ×2 IMPLANT
SLING ARM LRG ADULT FOAM STRAP (SOFTGOODS) ×2 IMPLANT
SLING ARM MED ADULT FOAM STRAP (SOFTGOODS) IMPLANT
SPONGE GAUZE 4X4 12PLY (GAUZE/BANDAGES/DRESSINGS) ×2 IMPLANT
SPONGE LAP 4X18 X RAY DECT (DISPOSABLE) IMPLANT
STRIP CLOSURE SKIN 1/2X4 (GAUZE/BANDAGES/DRESSINGS) IMPLANT
SUCTION FRAZIER TIP 10 FR DISP (SUCTIONS) IMPLANT
SUT FIBERWIRE #2 38 T-5 BLUE (SUTURE)
SUT FIBERWIRE 3-0 18 TAPR NDL (SUTURE)
SUT PROLENE 1 CT (SUTURE) IMPLANT
SUT PROLENE 3 0 PS 2 (SUTURE) ×2 IMPLANT
SUT TIGER TAPE 7 IN WHITE (SUTURE) IMPLANT
SUT VIC AB 0 CT1 27 (SUTURE)
SUT VIC AB 0 CT1 27XBRD ANBCTR (SUTURE) IMPLANT
SUT VIC AB 0 SH 27 (SUTURE) IMPLANT
SUT VIC AB 2-0 SH 27 (SUTURE)
SUT VIC AB 2-0 SH 27XBRD (SUTURE) IMPLANT
SUT VIC AB 3-0 SH 27 (SUTURE)
SUT VIC AB 3-0 SH 27X BRD (SUTURE) IMPLANT
SUT VIC AB 3-0 X1 27 (SUTURE) IMPLANT
SUTURE FIBERWR #2 38 T-5 BLUE (SUTURE) IMPLANT
SUTURE FIBERWR 3-0 18 TAPR NDL (SUTURE) IMPLANT
SYR 3ML 23GX1 SAFETY (SYRINGE) IMPLANT
SYR BULB 3OZ (MISCELLANEOUS) IMPLANT
TAPE FIBER 2MM 7IN #2 BLUE (SUTURE) IMPLANT
TAPE PAPER 3X10 WHT MICROPORE (GAUZE/BANDAGES/DRESSINGS) ×2 IMPLANT
TOWEL OR 17X24 6PK STRL BLUE (TOWEL DISPOSABLE) ×2 IMPLANT
TUBE CONNECTING 20X1/4 (TUBING) ×2 IMPLANT
TUBING ARTHROSCOPY IRRIG 16FT (MISCELLANEOUS) IMPLANT
WAND STAR VAC 90 (SURGICAL WAND) ×2 IMPLANT
WATER STERILE IRR 1000ML POUR (IV SOLUTION) ×2 IMPLANT
YANKAUER SUCT BULB TIP NO VENT (SUCTIONS) IMPLANT

## 2013-11-19 NOTE — Transfer of Care (Signed)
Immediate Anesthesia Transfer of Care Note  Patient: [name] [name]  Procedure(s) Performed: Procedure(s) with comments: RIGHT SHOULDER ARTHROSCOPY WITH SUBACROMIAL DECOMPRESSION/DISTAL CLAVICAL RESECTION/REPAIR SUPRASPINATUS INFRASPINATUS (Right) - ANESTHESIA: GENERAL/REGIONAL BLOCK  Procedure SAF DCR Bicepts tenotomy Grade 3 slap Debridment  Patient Location: PACU  Anesthesia Type:GA combined with regional for post-op pain  Level of Consciousness: awake, alert  and oriented  Airway & Oxygen Therapy: Patient Spontanous Breathing and Patient connected to face mask oxygen  Post-op Assessment: Report given to PACU RN and Post -op Vital signs reviewed and stable  Post vital signs: Reviewed and stable  Complications: No apparent anesthesia complications

## 2013-11-19 NOTE — Brief Op Note (Signed)
11/19/2013  12:57 PM  PATIENT:  [name] Roys [name]  58 y.o. 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  PROCEDURE:  RIGHT SHOULDER ARTHROSCOPY WITH EXTENSIVE DEBRIDEMENT OF SUPRASPINATUS, INFRASPINATUS, TYPE 3 SLAP LESION WITH BICEPS TENOTOMY AND RESECTION OF DISTAL CLAVICLE.  SURGEON:  Surgeon(s) and Role:    * Cammie Sickle., MD - Primary  PHYSICIAN ASSISTANT:   ASSISTANTS: Kathyrn Sheriff.A-C   ANESTHESIA:   general  EBL:  Total I/O In: 1400 [I.V.:1400] Out: -   BLOOD ADMINISTERED:none  DRAINS: none   LOCAL MEDICATIONS USED:  ROPIVACAINE PLEXUS BLOCK (PARTIAL)  SPECIMEN:  No Specimen  DISPOSITION OF SPECIMEN:  N/A  COUNTS:  YES  TOURNIQUET:  * No tourniquets in log *  DICTATION: .Other Dictation: Dictation Number [id number]  PLAN OF CARE: Discharge to home after PACU  PATIENT DISPOSITION:  PACU - hemodynamically stable.   Delay start of Pharmacological VTE agent (>24hrs) due to surgical blood loss or risk of bleeding: not applicable

## 2013-11-19 NOTE — Progress Notes (Signed)
Assisted Dr. Crews with right, ultrasound guided, interscalene  block. Side rails up, monitors on throughout procedure. See vital signs in flow sheet. Tolerated Procedure well. 

## 2013-11-19 NOTE — Anesthesia Postprocedure Evaluation (Signed)
  Anesthesia Post-op Note  Patient: [name] [name]  Procedure(s) Performed: Procedure(s) with comments: RIGHT SHOULDER ARTHROSCOPY WITH SUBACROMIAL DECOMPRESSION/DISTAL CLAVICAL RESECTION/REPAIR SUPRASPINATUS INFRASPINATUS (Right) - ANESTHESIA: GENERAL/REGIONAL BLOCK  Procedure SAF DCR Bicepts tenotomy Grade 3 slap Debridment  Patient Location: PACU  Anesthesia Type:GA combined with regional for post-op pain  Level of Consciousness: awake, alert  and oriented  Airway and Oxygen Therapy: Patient Spontanous Breathing  Post-op Pain: mild  Post-op Assessment: Post-op Vital signs reviewed  Post-op Vital Signs: Reviewed  Complications: No apparent anesthesia complications

## 2013-11-19 NOTE — Op Note (Signed)
[id number] 

## 2013-11-19 NOTE — Anesthesia Preprocedure Evaluation (Signed)
Anesthesia Evaluation  Patient identified by MRN, date of birth, ID band Patient awake    Reviewed: Allergy & Precautions, H&P , NPO status , Patient's Chart, lab work & pertinent test results  Airway Mallampati: I TM Distance: >3 FB Neck ROM: Full    Dental  (+) Teeth Intact, Dental Advisory Given   Pulmonary sleep apnea and Continuous Positive Airway Pressure Ventilation ,  breath sounds clear to auscultation        Cardiovascular Rhythm:Regular Rate:Normal     Neuro/Psych    GI/Hepatic GERD-  Medicated and Controlled,  Endo/Other    Renal/GU      Musculoskeletal   Abdominal   Peds  Hematology   Anesthesia Other Findings   Reproductive/Obstetrics                           Anesthesia Physical Anesthesia Plan  ASA: II  Anesthesia Plan: General   Post-op Pain Management:    Induction: Intravenous  Airway Management Planned: Oral ETT  Additional Equipment:   Intra-op Plan:   Post-operative Plan: Extubation in OR  Informed Consent: I have reviewed the patients History and Physical, chart, labs and discussed the procedure including the risks, benefits and alternatives for the proposed anesthesia with the patient or authorized representative who has indicated his/her understanding and acceptance.   Dental advisory given  Plan Discussed with: Anesthesiologist, CRNA and Surgeon  Anesthesia Plan Comments:         Anesthesia Quick Evaluation

## 2013-11-19 NOTE — Discharge Instructions (Signed)
[facility] Instructions Hand Surgery  Wound Care: Keep your hand elevated above the level of your heart.  Do not allow it to dangle by your side.  Keep the dressing dry and do not remove it unless your doctor advises you to do so.  He will usually change it at the time of your post-op visit.  Moving your fingers is advised to stimulate circulation but will depend on the site of your surgery.  If you have a splint applied, your doctor will advise you regarding movement.  Activity: Do not drive or operate machinery today.  Rest today and then you may return to your normal activity and work as indicated by your physician.  Diet:  Drink liquids today or eat a light diet.  You may resume a regular diet tomorrow.    General expectations: Pain for two to three days. Fingers may become slightly swollen.  Call your doctor if any of the following occur: Severe pain not relieved by pain medication. Elevated temperature. Dressing soaked with blood. Inability to move fingers. White or bluish color to fingers.  Begin pendulum exercises 4 times daily on Wednesday, April 8. Use narcotic pain medication as necessary for postoperative discomfort. Take Aleve or Advil for minor postoperative pain.  Return to [facility] for postoperative evaluation on Friday, 11/22/2013

## 2013-11-19 NOTE — Anesthesia Procedure Notes (Addendum)
Anesthesia Regional Block:  Interscalene brachial plexus block  Pre-Anesthetic Checklist: ,, timeout performed, Correct Patient, Correct Site, Correct Laterality, Correct Procedure, Correct Position, site marked, Risks and benefits discussed,  Surgical consent,  Pre-op evaluation,  At surgeon's request and post-op pain management  Laterality: Left and Upper  Prep: chloraprep       Needles:  Injection technique: Single-shot  Needle Type: Echogenic Needle     Needle Length: 5cm 5 cm Needle Gauge: 21 and 21 G    Additional Needles:  Procedures: ultrasound guided (picture in chart) Interscalene brachial plexus block Narrative:  Start time: 11/19/2013 10:20 AM End time: 11/19/2013 10:27 AM Injection made incrementally with aspirations every 5 mL.  Performed by: Personally  Anesthesiologist: Lorrene Reid, MD   Procedure Name: Intubation Date/Time: 11/19/2013 11:58 AM Performed by: Maryella Shivers Pre-anesthesia Checklist: Patient identified, Emergency Drugs available, Suction available and Patient being monitored Patient Re-evaluated:Patient Re-evaluated prior to inductionOxygen Delivery Method: Circle System Utilized Preoxygenation: Pre-oxygenation with 100% oxygen Intubation Type: IV induction Ventilation: Mask ventilation without difficulty Laryngoscope Size: Mac and 3 Grade View: Grade I Tube type: Oral Tube size: 8.0 mm Number of attempts: 1 Airway Equipment and Method: stylet and oral airway Placement Confirmation: ETT inserted through vocal cords under direct vision,  positive ETCO2 and breath sounds checked- equal and bilateral Secured at: 22 cm Tube secured with: Tape Dental Injury: Teeth and Oropharynx as per pre-operative assessment

## 2013-11-20 DIAGNOSIS — S43439A Superior glenoid labrum lesion of unspecified shoulder, initial encounter: Secondary | ICD-10-CM | POA: Diagnosis not present

## 2013-11-20 NOTE — Op Note (Signed)
NAME:  [name] [name], [name] [name]                  ACCOUNT NO.:  [id number]  MEDICAL RECORD NO.:  [id number]  LOCATION:                                 FACILITY:  PHYSICIAN:  Katy Fitch. Alexus Michael, M.D. DATE OF BIRTH:  [birth year]  DATE OF PROCEDURE:  11/19/2013 DATE OF DISCHARGE:                              OPERATIVE REPORT   PREOPERATIVE DIAGNOSES:  MRI documented partial articular surface tear and partial interstitial tendon avulsion tear of supraspinatus and infraspinatus tendons, right shoulder with unfavorable distal clavicle anatomy status post prior arthroscopic debridement and type 1 or type 3 superior labrum anterior and posterior lesion with unstable biceps origin.  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  OPERATION: 1. Diagnostic arthroscopy, right glenohumeral joint. 2. Arthroscopic debridement of type 3 SLAP lesion with abrasion     chondroplasty of glenoid and limited chondroplasty of humeral head. 3. Arthroscopic biceps tenotomy. 4. Subacromial bursectomy, coracoacromial ligament relaxation, and     minimal acromioplasty. 5. Arthroscopic distal clavicle resection.  Ultimately, after careful inspection of [name] shoulder, a decision was made not to attempt a partial articular surface rotator cuff repair in that he had less than 50% tear of his rotator cuff origin on direct inspection.  His preoperative MRI that suggested a more extensive tear.  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  In my judgment, [name] should be able to rehabilitate his shoulder well after biceps tenotomy and correction of his likely painful SLAP lesion.  At some point, he will likely require implant arthroplasty of the shoulder as he is experiencing rather dramatic chondromalacia changes of the glenohumeral articulation.  OPERATING SURGEON:  Katy Fitch. Juanjose Mojica, MD.  ASSISTANT:  Marveen Reeks Dasnoit, PA-C  ANESTHESIA:  General endotracheal supplemented by an attempted ropivacaine right plexus block.  Immediately preoperatively, he was noted to have full motor function.  INDICATIONS:  [name] [name] is a 58 year old, right-hand dominant Glass blower/designer who has had a long history of bilateral shoulder impairment.  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  He underwent arthroscopic management of his labral tear and open resection of spinoglenoid cyst.  He ultimately recovered excellent strength of his periscapular muscles.  He has subsequently had bilateral shoulder issues and underwent arthroscopic debridement and decompression of the left shoulder, followed by a second procedure on the left when an MRI revealed a 90% PASTA type tear.  His left shoulder underwent rotator cuff reconstruction in January 2013. During the past year, he has had increasing pain on the right side.  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  He has had marked pain with throwing and  weight training.  Given his combined impairments of pain with motion, pain at night, throwing pain, and weightlifting pain, we recently obtained a repeat MRI.  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  He had some regrowth of his distal clavicle after prior distal  clavicle debridement performed previously.  Due to the fact that [name] was not able to recover strength with a weight training program.  We advised him to proceed at this time with diagnostic arthroscopy, subacromial debridement and decompression, debridement of a portion of the distal clavicle that might be impinging on his cuff and careful analysis of his supraspinatus and infraspinatus rotator cuff pathology.  Also he was known to have a SLAP lesion with a likely unstable biceps origin.  At age 58, biceps tenodesis would not be indicated unless it was incorporated into a rotator cuff repair.  We had a detailed informed consent preoperatively.  Based on plain film findings revealing squaring of his humeral head, there was concern that he was developing a progressive glenohumeral degenerative arthritis.  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  After informed consent, he was brought to the operating at this time.  Preoperatively, he was interviewed by Dr. Ivin Booty of Anesthesia in the holding area.  With ultrasound guidance, a ropivacaine plexus block was placed.  This appeared to have only partial affect in that [name] had full motor function 1 hour following block placement.  His right arm was marked as the proper surgical site per protocol with a marking pen.  Questions were invited and answered in detail in the holding area followed by transfer to room 2 of the [facility].  In room 2 under Dr. Ivin Booty' direct supervision, general endotracheal anesthesia was induced.  Mr. Tyburski was then carefully positioned in the beach-chair position with aid of a torso and head holder designed for shoulder arthroscopy.  The entire right upper extremity and forequarter were prepped with DuraPrep and draped with impervious arthroscopy drapes.  A 2 g of Ancef were administered as an IV prophylactic antibiotic.  Following routine surgical  time-out, the shoulder was instrumented with a switching stick anteriorly.  The arthroscope was placed through a standard posterior viewing portal with blunt technique.  Diagnostic arthroscopy immediately revealed grade 3 and 4 chondromalacia of the superior glenoid and type 3 SLAP lesion with unstable biceps origin.  A significant degenerative anterior labrum and grade 3 and 4 chondromalacia of the anterior-inferior glenoid.  The digital camera was used to document these findings arthroscopically. The humeral head was carefully inspected and found to have areas grade 2 and 3 chondromalacia.  The subscapularis tendon had a very minor type 1 superior degenerative change.  The supraspinatus had an intact articular side, however, upon careful inspection and probing, there was a full- thickness area of tendinopathy at the junction between the infraspinatus and supraspinatus and opening that led to a rather substantial intratendinous cavity compatible with a PITA lesion.  A 4.2 mm suction shaver was used to debride through the articular side and debride the cavity to a stable tendon/bone margin.  The thickness of the tear was measured to be between 4 and 5 mm using a nerve hook, and a measuring arthroscopic shaver.  This was documented with the digital camera.  After completion of the intra-articular debridement, the scope was removed from glenohumeral joint and placed in the subacromial space.  The bursa had  reformed following prior arthroscopic debridement.  The acromion had appropriate profile.  The coracoacromial ligament had reformed.  The distal clavicle was markedly prominent.  An anterior portal was created to allow clavicle debridement.  The undersurface of the clavicle was beveled to prevent impingement.  The cuff was carefully inspected and palpated.  We could identify no evidence of full-thickness tear.  After bursectomy and hemostasis, we then replaced the scope in the  glenohumeral joint.  Based on his significant degree of chondromalacia, in my judgment, Mr. Illes next significant procedure will be an implant arthroplasty.  He will likely have years of satisfactory shoulder function prior to proceeding in this direction.  I could not justify proceeding with rotator cuff repair with less than half of the cuff off of the greater tuberosity particularly as it was in the watershed area with an intact cable.  Based on these findings, we elected not to perform a PASTA repair and obtained hemostasis with the arthroscopic cautery equipment.  For aftercare, Mr. Waskey will begin immediate active range of motion exercises once his block wears off.  We will review his arthroscopic images with him in detail in the office.  If his shoulder pain should persist, we will try diagnostic blocks of the shoulder with lidocaine and Depo-Medrol in an effort to dissect out how much pain is coming from his chondromalacia.  He was placed in a sling, awakened from general anesthesia and transferred to the recovery room with stable signs.  Note that he will be admitted to recovery care center for observation, his vital signs, and prophylactic antibiotics in the form of Ancef 2 g IV q.6 hours x3 doses.     Katy Fitch Lanyah Spengler, M.D.    RVS/MEDQ  D:  11/19/2013  T:  11/20/2013  Job:  [id number]

## 2013-11-21 ENCOUNTER — Encounter (HOSPITAL_BASED_OUTPATIENT_CLINIC_OR_DEPARTMENT_OTHER): Payer: Self-pay | Admitting: Orthopedic Surgery

## 2013-11-21 ENCOUNTER — Ambulatory Visit

## 2013-12-10 ENCOUNTER — Ambulatory Visit: Admitting: Psychology

## 2013-12-10 DIAGNOSIS — Z0289 Encounter for other administrative examinations: Secondary | ICD-10-CM

## 2013-12-17 ENCOUNTER — Encounter: Payer: Self-pay | Admitting: Internal Medicine

## 2014-02-03 ENCOUNTER — Encounter: Payer: Self-pay | Admitting: Internal Medicine

## 2014-02-04 ENCOUNTER — Telehealth: Payer: Self-pay | Admitting: Pulmonary Disease

## 2014-02-04 NOTE — Telephone Encounter (Signed)
I spoke with Merla at [employer]. She is looking to see if the pt has a Banker and will fax. If one is not available they will call the pt to request his SD Winther. Elwood Bing, CMA

## 2014-02-04 NOTE — Telephone Encounter (Signed)
Spoke with the pt  He states coming in next month for annual f/u of OSA  He wants [place] to go ahead and get a DL from his CPAP so we can discuss at ov  Discussed with Jenn and she will check to see if pt's info is in [place]  Pt uses [employer]  Thanks!

## 2014-02-18 ENCOUNTER — Encounter: Payer: Self-pay | Admitting: Pulmonary Disease

## 2014-02-18 ENCOUNTER — Ambulatory Visit (INDEPENDENT_AMBULATORY_CARE_PROVIDER_SITE_OTHER): Admitting: Pulmonary Disease

## 2014-02-18 VITALS — BP 160/98 | HR 68 | Temp 98.0°F | Ht 70.0 in | Wt 212.8 lb

## 2014-02-18 DIAGNOSIS — G4733 Obstructive sleep apnea (adult) (pediatric): Secondary | ICD-10-CM

## 2014-02-18 NOTE — Patient Instructions (Signed)
Your download shows good control of your sleep apnea.  Would set your older machine to 13cm when you are away from home.  Keep working on weight loss followup with me again in one year, but call if having issues.

## 2014-02-18 NOTE — Assessment & Plan Note (Signed)
The patient is doing very well with his CPAP device, and his download shows excellent control of his AHI. I have asked him to continue on his device, and to keep up with his mask changes and supplies. I've also encouraged him to work aggressively on weight loss.

## 2014-02-18 NOTE — Progress Notes (Signed)
   Subjective:    Patient ID: [name] [name], male    DOB: [birth year], 58 y.o.   MRN: [id number]  HPI The patient comes in today for followup of his obstructive sleep apnea. He is wearing CPAP compliantly, and is having no issues with his mask fit or pressure. His download shows excellent control of his sleep apnea, and he has good compliance. He sleeps well with the device, but is still having some fatigue during the day that I suspect has nothing to do with his sleep disordered breathing.   Review of Systems  Constitutional: Negative for fever and unexpected weight change.  HENT: Negative for congestion, dental problem, ear pain, nosebleeds, postnasal drip, rhinorrhea, sinus pressure, sneezing, sore throat and trouble swallowing.   Eyes: Negative for redness and itching.  Respiratory: Negative for cough, chest tightness, shortness of breath and wheezing.   Cardiovascular: Negative for palpitations and leg swelling.  Gastrointestinal: Negative for nausea and vomiting.  Genitourinary: Negative for dysuria.  Musculoskeletal: Negative for joint swelling.  Skin: Negative for rash.  Neurological: Negative for headaches.  Hematological: Does not bruise/bleed easily.  Psychiatric/Behavioral: Negative for dysphoric mood. The patient is not nervous/anxious.        Objective:   Physical Exam Overweight male in no acute distress Nose without purulence or discharge noted No skin breakdown or pressure necrosis from the CPAP mask Neck without lymphadenopathy or thyromegaly Lower extremities without edema, no cyanosis Alert and oriented, moves all 4 extremities.       Assessment & Plan:

## 2014-03-06 ENCOUNTER — Ambulatory Visit (INDEPENDENT_AMBULATORY_CARE_PROVIDER_SITE_OTHER): Admitting: Internal Medicine

## 2014-03-06 ENCOUNTER — Encounter: Payer: Self-pay | Admitting: Internal Medicine

## 2014-03-06 VITALS — BP 136/82 | HR 72 | Temp 98.7°F | Resp 16 | Ht 70.0 in | Wt 215.5 lb

## 2014-03-06 DIAGNOSIS — Z0000 Encounter for general adult medical examination without abnormal findings: Secondary | ICD-10-CM

## 2014-03-06 DIAGNOSIS — G4733 Obstructive sleep apnea (adult) (pediatric): Secondary | ICD-10-CM

## 2014-03-06 DIAGNOSIS — E291 Testicular hypofunction: Secondary | ICD-10-CM

## 2014-03-06 DIAGNOSIS — F411 Generalized anxiety disorder: Secondary | ICD-10-CM

## 2014-03-06 DIAGNOSIS — H547 Unspecified visual loss: Secondary | ICD-10-CM

## 2014-03-06 DIAGNOSIS — F329 Major depressive disorder, single episode, unspecified: Secondary | ICD-10-CM

## 2014-03-06 DIAGNOSIS — F319 Bipolar disorder, unspecified: Secondary | ICD-10-CM

## 2014-03-06 DIAGNOSIS — Z23 Encounter for immunization: Secondary | ICD-10-CM

## 2014-03-06 DIAGNOSIS — F3289 Other specified depressive episodes: Secondary | ICD-10-CM

## 2014-03-06 DIAGNOSIS — R682 Dry mouth, unspecified: Secondary | ICD-10-CM | POA: Insufficient documentation

## 2014-03-06 DIAGNOSIS — Z2911 Encounter for prophylactic immunotherapy for respiratory syncytial virus (RSV): Secondary | ICD-10-CM

## 2014-03-06 DIAGNOSIS — K117 Disturbances of salivary secretion: Secondary | ICD-10-CM

## 2014-03-06 NOTE — Assessment & Plan Note (Signed)
On CPAP. ?

## 2014-03-06 NOTE — Assessment & Plan Note (Signed)
Continue with current prescription prn therapy as reflected on the Med list.  

## 2014-03-06 NOTE — Assessment & Plan Note (Signed)
Continue with current prescription therapy as reflected on the Med list.  

## 2014-03-06 NOTE — Progress Notes (Signed)
Pre visit review using our clinic review tool, if applicable. No additional management support is needed unless otherwise documented below in the visit note. 

## 2014-03-06 NOTE — Progress Notes (Signed)
Subjective:     HPI  The patient is here for a wellness exam.   The patient is here to follow up on chronic [place] pain, depression, anxiety, headaches and chronic moderate fibromyalgia symptoms controlled with medicines, diet and exercise. He is separated - in the process of getting a divorce.   Son Jenny Reichmann lives w/Oather every other week...  Maijor had a shoulder surgery over Easter - he had a n allergic reaction - red skin, red eyes...  BP Readings from Last 3 Encounters:  02/18/14 160/98  11/20/13 123/68  11/20/13 123/68   Wt Readings from Last 3 Encounters:  02/18/14 212 lb 12.8 oz (96.525 kg)  11/19/13 210 lb 6 oz (95.425 kg)  11/19/13 210 lb 6 oz (95.425 kg)      Review of Systems  Constitutional: Positive for fatigue. Negative for appetite change and unexpected weight change.  HENT: Negative for congestion, nosebleeds, sneezing, sore throat and trouble swallowing.   Eyes: Negative for itching and visual disturbance.  Respiratory: Negative for cough.   Cardiovascular: Negative for chest pain, palpitations and leg swelling.  Gastrointestinal: Negative for nausea, diarrhea, blood in stool and abdominal distention.  Genitourinary: Negative for frequency and hematuria.  Musculoskeletal: Negative for back pain, gait problem, joint swelling and neck pain.  Skin: Negative for rash.  Neurological: Negative for dizziness, tremors, speech difficulty and weakness.  Psychiatric/Behavioral: Positive for sleep disturbance. Negative for suicidal ideas, dysphoric mood and agitation. The patient is nervous/anxious.        Objective:   Physical Exam  Constitutional: He is oriented to person, place, and time. He appears well-developed.  HENT:  Mouth/Throat: Oropharynx is clear and moist.  Eyes: Conjunctivae are normal. Pupils are equal, round, and reactive to light.  Neck: Normal range of motion. No JVD present. No thyromegaly present.  Cardiovascular: Normal rate, regular  rhythm, normal heart sounds and intact distal pulses.  Exam reveals no gallop and no friction rub.   No murmur heard. Pulmonary/Chest: Effort normal and breath sounds normal. No respiratory distress. He has no wheezes. He has no rales. He exhibits no tenderness.  Abdominal: Soft. Bowel sounds are normal. He exhibits no distension and no mass. There is no tenderness. There is no rebound and no guarding.  Musculoskeletal: Normal range of motion. He exhibits tenderness (shoulders are tender B L>>R). He exhibits no edema.  Lymphadenopathy:    He has no cervical adenopathy.  Neurological: He is alert and oriented to person, place, and time. He has normal reflexes. No cranial nerve deficit. He exhibits normal muscle tone. Coordination normal.  Skin: Skin is warm and dry. No rash noted.  Psychiatric: He has a normal mood and affect. His behavior is normal. Judgment and thought content normal.  Rectal - per Dr Karsten Ro  Lab Results  Component Value Date   WBC 4.2* 01/25/2010   HGB 15.7 11/19/2013   HCT 35.5* 01/25/2010   PLT 252.0 01/25/2010   GLUCOSE 94 10/11/2012   CHOL 199 10/11/2012   TRIG 88.0 10/11/2012   HDL 41.70 10/11/2012   LDLDIRECT 77.5 12/26/2008   LDLCALC 140* 10/11/2012   ALT 30 10/11/2012   AST 21 10/11/2012   NA 139 10/11/2012   K 4.0 10/11/2012   CL 102 10/11/2012   CREATININE 1.2 10/11/2012   BUN 24* 10/11/2012   CO2 30 10/11/2012   TSH 1.45 01/25/2010   PSA 2.17 01/25/2010   HGBA1C 6.1 10/11/2012         Assessment &  Plan:

## 2014-03-06 NOTE — Assessment & Plan Note (Signed)
Labs

## 2014-03-06 NOTE — Assessment & Plan Note (Signed)
We discussed age appropriate health related issues, including available/recomended screening tests and vaccinations. We discussed a need for adhering to healthy diet and exercise. Labs/EKG were reviewed/ordered. All questions were answered.  Just had a PSA w/Dr Risa Grill Colon due - has an appt w/Dr Alcolu ref  Zostavax

## 2014-03-24 ENCOUNTER — Ambulatory Visit: Admitting: Pulmonary Disease

## 2014-04-08 ENCOUNTER — Telehealth: Payer: Self-pay

## 2014-04-08 NOTE — Telephone Encounter (Signed)
Spoke with patient and he feels more comfortable keeping his appointment for 04/10/14 to discuss colonoscopy.

## 2014-04-08 NOTE — Telephone Encounter (Signed)
Message copied by [place], Agnieszka Newhouse E on Tue Apr 08, 2014 12:50 PM ------      Message from: Silvano Rusk E      Created: Mon Apr 07, 2014  8:51 PM       He is not due until 2020      May cancel appt unless he feels need to keep      ----- Message -----         From: Mykhia Danish E [place], [place]: 03/31/2014   7:21 AM           To: Gatha Mayer, MD            Patient has appointment 04/10/14 to discuss need for colon.  Looks like to me he is due 2020.  Please review Sir , thank you.  Welcome back :)       ------

## 2014-04-10 ENCOUNTER — Ambulatory Visit (INDEPENDENT_AMBULATORY_CARE_PROVIDER_SITE_OTHER): Admitting: Internal Medicine

## 2014-04-10 ENCOUNTER — Encounter: Payer: Self-pay | Admitting: Internal Medicine

## 2014-04-10 VITALS — BP 132/86 | HR 72 | Ht 70.0 in | Wt 218.8 lb

## 2014-04-10 DIAGNOSIS — R197 Diarrhea, unspecified: Secondary | ICD-10-CM

## 2014-04-10 DIAGNOSIS — Z860100 Personal history of colon polyps, unspecified: Secondary | ICD-10-CM

## 2014-04-10 NOTE — Progress Notes (Signed)
   Subjective:    Patient ID: [name] [name], male    DOB: 09/04/1955, 58 y.o.   MRN: [id number]  HPI [name] [name] is here about 5 years after his last colonoscopy which did not show any polyps. Some diarrhea still. Hx of polyps - inflammatory 1990 - could have had adenomas in 2000 and 2005 (no path) GERD ok  Finalizing divorce Recently resigned as Pharmacist, hospital Retired Nature conservation officer  Medications, allergies, past medical history, past surgical history, family history and social history are reviewed and updated in the EMR.  Review of Systems As above    Objective:   Physical Exam General:  NAD Eyes:   anicteric Lungs:  clear Heart:  S1S2 no rubs, murmurs or gallops Abdomen:  soft and nontender, BS+ Ext:   no edema    Data Reviewed:       Assessment & Plan:   1. Personal history of colonic polyps   2. Diarrhea    It's not clear that he ever did have adenomas but he has had some sort of polyps in the past. He is also having recurrent diarrhea symptoms. He is anxious about all of this so we have decided to proceed with a colonoscopy. If there are no polyps, a routine colonoscopy will not be needed for 10 years.The risks and benefits as well as alternatives of endoscopic procedure(s) have been discussed and reviewed. All questions answered. The patient agrees to proceed.

## 2014-04-10 NOTE — Patient Instructions (Addendum)
You have been scheduled for a colonoscopy. Please follow written instructions given to you at your visit today.  Please pick up your prep kit at the pharmacy within the next 1-3 days. If you use inhalers (even only as needed), please bring them with you on the day of your procedure.   I appreciate the opportunity to care for you.  

## 2014-05-01 ENCOUNTER — Other Ambulatory Visit: Payer: Self-pay | Admitting: Internal Medicine

## 2014-05-01 NOTE — Telephone Encounter (Signed)
Ok to Rf? Med was d/c

## 2014-05-13 ENCOUNTER — Ambulatory Visit (INDEPENDENT_AMBULATORY_CARE_PROVIDER_SITE_OTHER): Admitting: Psychology

## 2014-05-13 DIAGNOSIS — F319 Bipolar disorder, unspecified: Secondary | ICD-10-CM

## 2014-05-22 ENCOUNTER — Encounter: Admitting: Internal Medicine

## 2014-05-23 ENCOUNTER — Ambulatory Visit (INDEPENDENT_AMBULATORY_CARE_PROVIDER_SITE_OTHER): Admitting: Psychology

## 2014-05-23 DIAGNOSIS — F319 Bipolar disorder, unspecified: Secondary | ICD-10-CM

## 2014-05-27 ENCOUNTER — Encounter: Payer: Self-pay | Admitting: Internal Medicine

## 2014-05-27 ENCOUNTER — Ambulatory Visit (AMBULATORY_SURGERY_CENTER): Admitting: Internal Medicine

## 2014-05-27 VITALS — BP 125/73 | HR 62 | Temp 97.3°F | Resp 42 | Ht 70.0 in | Wt 218.0 lb

## 2014-05-27 DIAGNOSIS — K5730 Diverticulosis of large intestine without perforation or abscess without bleeding: Secondary | ICD-10-CM

## 2014-05-27 DIAGNOSIS — Z86010 Personal history of colonic polyps: Secondary | ICD-10-CM

## 2014-05-27 MED ORDER — PANTOPRAZOLE SODIUM 40 MG PO TBEC: 40.0000 mg | DELAYED_RELEASE_TABLET | Freq: Every day | ORAL | Status: DC

## 2014-05-27 MED ORDER — SODIUM CHLORIDE 0.9 % IV SOLN: 500.0000 mL | INTRAVENOUS | Status: DC

## 2014-05-27 NOTE — Progress Notes (Signed)
A/ox3 pleased with MAC, report to Lanny Cramp

## 2014-05-27 NOTE — Op Note (Signed)
[facility]  [address] [place], [id number]   COLONOSCOPY PROCEDURE REPORT  PATIENT: [name] [name], [name] [name]  MR#: [id number] BIRTHDATE: 18-Jun-1956 , 16  yrs. old GENDER: male ENDOSCOPIST: Gatha Mayer, MD, [facility] PROCEDURE DATE:  05/27/2014 PROCEDURE:   Colonoscopy, surveillance First Screening Colonoscopy - Avg.  risk and is 50 yrs.  old or older - No.  Prior Negative Screening - Now for repeat screening. N/A ASA CLASS:   Class II INDICATIONS:high risk personal history of colonic polyps. MEDICATIONS: Propofol 200 mg IV and Monitored anesthesia care  DESCRIPTION OF PROCEDURE:   After the risks benefits and alternatives of the procedure were thoroughly explained, informed consent was obtained.  The digital rectal exam revealed no abnormalities of the rectum, revealed no prostatic nodules, and revealed the prostate was not enlarged.   The [id number] endoscope was introduced through the anus and advanced to the terminal ileum which was intubated for a short distance. No adverse events experienced.   The quality of the prep was good, using MiraLax  The instrument was then slowly withdrawn as the colon was fully examined.      COLON FINDINGS: There was mild diverticulosis noted in the sigmoid colon.   The examination was otherwise normal.   Right colon retroflexion included.  Retroflexed views revealed no abnormalities. The time to cecum=2 minutes 42 seconds.  Withdrawal time=11 minutes 03 seconds.  The scope was withdrawn and the procedure completed. COMPLICATIONS: There were no immediate complications.  ENDOSCOPIC IMPRESSION: 1.   Mild diverticulosis was noted in the sigmoid colon 2.   The examination was otherwise normal - remote hx polyps last 2005 (no pathology)  RECOMMENDATIONS: Repeat colonoscopy 10 years.  eSigned:  Gatha Mayer, MD, [facility] 05/27/2014 11:24 AM   cc: The Patient

## 2014-05-27 NOTE — Patient Instructions (Addendum)
No polyps today!  Next routine colonoscopy in 10 years - 2025  I appreciate the opportunity to care for you. Gatha Mayer, MD, FACG  YOU HAD AN ENDOSCOPIC PROCEDURE TODAY AT [facility]: Refer to the procedure report that was given to you for any specific questions about what was found during the examination.  If the procedure report does not answer your questions, please call your gastroenterologist to clarify.  If you requested that your care partner not be given the details of your procedure findings, then the procedure report has been included in a sealed envelope for you to review at your convenience later.  YOU SHOULD EXPECT: Some feelings of bloating in the abdomen. Passage of more gas than usual.  Walking can help get rid of the air that was put into your GI tract during the procedure and reduce the bloating. If you had a lower endoscopy (such as a colonoscopy or flexible sigmoidoscopy) you may notice spotting of blood in your stool or on the toilet paper. If you underwent a bowel prep for your procedure, then you may not have a normal bowel movement for a few days.  DIET: Your first meal following the procedure should be a light meal and then it is ok to progress to your normal diet.  A half-sandwich or bowl of soup is an example of a good first meal.  Heavy or fried foods are harder to digest and may make you feel nauseous or bloated.  Likewise meals heavy in dairy and vegetables can cause extra gas to form and this can also increase the bloating.  Drink plenty of fluids but you should avoid alcoholic beverages for 24 hours.  ACTIVITY: Your care partner should take you home directly after the procedure.  You should plan to take it easy, moving slowly for the rest of the day.  You can resume normal activity the day after the procedure however you should NOT DRIVE or use heavy machinery for 24 hours (because of the sedation medicines used during the test).     SYMPTOMS TO REPORT IMMEDIATELY: A gastroenterologist can be reached at any hour.  During normal business hours, 8:30 AM to 5:00 PM Monday through Friday, call [phone].  After hours and on weekends, please call the GI answering service at [phone] who will take a message and have the physician on call contact you.   Following lower endoscopy (colonoscopy or flexible sigmoidoscopy):  Excessive amounts of blood in the stool  Significant tenderness or worsening of abdominal pains  Swelling of the abdomen that is new, acute  Fever of 100F or higher  FOLLOW UP: If any biopsies were taken you will be contacted by phone or by letter within the next 1-3 weeks.  Call your gastroenterologist if you have not heard about the biopsies in 3 weeks.  Our staff will call the home number listed on your records the next business day following your procedure to check on you and address any questions or concerns that you may have at that time regarding the information given to you following your procedure. This is a courtesy call and so if there is no answer at the home number and we have not heard from you through the emergency physician on call, we will assume that you have returned to your regular daily activities without incident.  SIGNATURES/CONFIDENTIALITY: You and/or your care partner have signed paperwork which will be entered into your electronic medical record.  These  signatures attest to the fact that that the information above on your After Visit Summary has been reviewed and is understood.  Full responsibility of the confidentiality of this discharge information lies with you and/or your care-partner.  Recommendations Diverticulosis handout provided to patient. Next routine colonoscopy in 10 years.

## 2014-05-28 ENCOUNTER — Telehealth: Payer: Self-pay | Admitting: *Deleted

## 2014-05-28 NOTE — Telephone Encounter (Signed)
  Follow up Call-  Call back number 05/27/2014  Post procedure Call Back phone  # [phone]  Permission to leave phone message Yes     Patient questions:  Do you have a fever, pain , or abdominal swelling? No. Pain Score  0 *  Have you tolerated food without any problems? Yes.    Have you been able to return to your normal activities? Yes.    Do you have any questions about your discharge instructions: Diet   No. Medications  No. Follow up visit  No.  Do you have questions or concerns about your Care? No.  Actions: * If pain score is 4 or above:  Pt did c/o rash on both sides from  arm pits down to waist and says he had a rash like this last time he had surgery. Pt says he has a call in to his surgeon ( he has an upcoming surgery scheduled) to see if they want to reschedule him or just to notifiy them of the reaction.Told pt. Name of anesthesia he had here yesterday was Propofol so he could tell his surgeon and then discussed this with [facility] Monday CRNA . No further advice was given to pt . Since he is calling his Psychologist, sport and exercise. No action needed, pain <4.

## 2014-06-06 ENCOUNTER — Ambulatory Visit (INDEPENDENT_AMBULATORY_CARE_PROVIDER_SITE_OTHER): Admitting: Psychology

## 2014-06-06 DIAGNOSIS — F319 Bipolar disorder, unspecified: Secondary | ICD-10-CM

## 2014-06-16 ENCOUNTER — Telehealth: Payer: Self-pay | Admitting: Internal Medicine

## 2014-06-16 NOTE — Telephone Encounter (Signed)
Patient Information:  Caller Name: Roarke  Phone: [phone]  Patient: [name] [name], [name] [name]  Gender: Male  DOB: 16-Dec-1955  Age: 58 Years  PCP: Plotnikov, Alex (Adults only)  Office Follow Up:  Does the office need to follow up with this patient?: No  Instructions For The Office: N/A  RN Note:  Patient calling with c/o left sided chest pain.  States "Istrted having this chest painwhile I was in [employer], which continued when i got home.  I took my b/p 188/118, Pulse 92.  Denies symptoms now and is in car driving to pick up son.  Patient refuses 911 stating my ex-wife lives close by I'll stop in and get her to check me out.  Symptoms  Reason For Call & Symptoms: chest pain  Reviewed Health History In EMR: Yes  Reviewed Medications In EMR: Yes  Reviewed Allergies In EMR: No  Reviewed Surgeries / Procedures: Yes  Date of Onset of Symptoms: 06/16/2014  Guideline(s) Used:  Chest Pain  Disposition Per Guideline:   Call EMS 911 Now  Reason For Disposition Reached:   Chest pain lasting longer than 5 minutes and ANY of the following:  Over 16 years old Over 24 years old and at least one cardiac risk factor (I.e., high blood pressure, diabetes, high cholesterol, obesity, smoker or strong family history of heart disease) Pain is crushing, pressure-like, or heavy  Took nitroglycerin and chest pain was not relieved History of heart disease (I.e., angina, heart attack, bypass surgery, angioplasty, CHF)  Advice Given:  N/A  Patient Refused Recommendation:  Patient Refused Care Advice  States he Will have his ex wife check him out.

## 2014-06-17 ENCOUNTER — Telehealth: Payer: Self-pay | Admitting: Internal Medicine

## 2014-06-17 NOTE — Telephone Encounter (Signed)
pls sch OV w/any provider I could try to see him tomorrow at 8:30 am Thx

## 2014-06-17 NOTE — Telephone Encounter (Signed)
Pt requesting workin appt this AM. Pt spoke to call a nurse last pm, please see note. Pt would prefer to see Dr Alain Marion but provider schedule is full. No am appts with any providers in clinic - please advise.

## 2014-06-18 ENCOUNTER — Encounter: Payer: Self-pay | Admitting: Internal Medicine

## 2014-06-18 ENCOUNTER — Ambulatory Visit (INDEPENDENT_AMBULATORY_CARE_PROVIDER_SITE_OTHER): Admitting: Internal Medicine

## 2014-06-18 VITALS — BP 158/100 | HR 65 | Temp 98.5°F | Wt 209.0 lb

## 2014-06-18 DIAGNOSIS — E785 Hyperlipidemia, unspecified: Secondary | ICD-10-CM

## 2014-06-18 DIAGNOSIS — R0789 Other chest pain: Secondary | ICD-10-CM

## 2014-06-18 DIAGNOSIS — F319 Bipolar disorder, unspecified: Secondary | ICD-10-CM

## 2014-06-18 DIAGNOSIS — I10 Essential (primary) hypertension: Secondary | ICD-10-CM

## 2014-06-18 DIAGNOSIS — R072 Precordial pain: Secondary | ICD-10-CM

## 2014-06-18 MED ORDER — METOPROLOL SUCCINATE ER 25 MG PO TB24: 25.0000 mg | ORAL_TABLET | Freq: Every day | ORAL | Status: DC

## 2014-06-18 NOTE — Assessment & Plan Note (Addendum)
11/15 See Rx: added a beta-blocker

## 2014-06-18 NOTE — Assessment & Plan Note (Signed)
11/15 new EKG Stress test

## 2014-06-18 NOTE — Assessment & Plan Note (Signed)
Continue with current prescription therapy as reflected on the Med list.  

## 2014-06-18 NOTE — Progress Notes (Signed)
Pre visit review using our clinic review tool, if applicable. No additional management support is needed unless otherwise documented below in the visit note. 

## 2014-06-18 NOTE — Patient Instructions (Signed)
To ER if chest pain

## 2014-06-18 NOTE — Telephone Encounter (Signed)
Notified pt with md response made appt for 9:30...[name] [name]

## 2014-06-18 NOTE — Progress Notes (Signed)
   Subjective:     Chest Pain  This is a new problem. The current episode started in the past 7 days. The onset quality is sudden. Episode frequency: x1. The problem has been resolved. The pain is present in the substernal region. The pain is at a severity of 5/10. The pain is moderate. The quality of the pain is described as heavy. The pain does not radiate. Pertinent negatives include no back pain, cough, dizziness, nausea, palpitations or weakness.    C/o elev BP: SBP 150-170 C/o CP on Monday x 5 min  The patient is here to follow up on chronic B soulder pain, depression, anxiety, headaches and chronic moderate fibromyalgia symptoms controlled with medicines, diet and exercise. He is separated - in the process of getting a divorce.   Son Jenny Reichmann lives w/Niall every other week...  Tc had a shoulder surgery over Easter - he had a n allergic reaction - red skin, red eyes...  BP Readings from Last 3 Encounters:  06/18/14 158/100  05/27/14 125/73  04/10/14 132/86   Wt Readings from Last 3 Encounters:  06/18/14 209 lb (94.802 kg)  05/27/14 218 lb (98.884 kg)  04/10/14 218 lb 12.8 oz (99.247 kg)      Review of Systems  Constitutional: Positive for fatigue. Negative for appetite change and unexpected weight change.  HENT: Negative for congestion, nosebleeds, sneezing, sore throat and trouble swallowing.   Eyes: Negative for itching and visual disturbance.  Respiratory: Negative for cough.   Cardiovascular: Negative for chest pain, palpitations and leg swelling.  Gastrointestinal: Negative for nausea, diarrhea, blood in stool and abdominal distention.  Genitourinary: Negative for frequency and hematuria.  Musculoskeletal: Negative for back pain, joint swelling, gait problem and neck pain.  Skin: Negative for rash.  Neurological: Negative for dizziness, tremors, speech difficulty and weakness.  Psychiatric/Behavioral: Positive for sleep disturbance. Negative for suicidal ideas,  dysphoric mood and agitation. The patient is nervous/anxious.        Objective:   Physical Exam  Lab Results  Component Value Date   WBC 4.2* 01/25/2010   HGB 15.7 11/19/2013   HCT 35.5* 01/25/2010   PLT 252.0 01/25/2010   GLUCOSE 94 10/11/2012   CHOL 199 10/11/2012   TRIG 88.0 10/11/2012   HDL 41.70 10/11/2012   LDLDIRECT 77.5 12/26/2008   LDLCALC 140* 10/11/2012   ALT 30 10/11/2012   AST 21 10/11/2012   NA 139 10/11/2012   K 4.0 10/11/2012   CL 102 10/11/2012   CREATININE 1.2 10/11/2012   BUN 24* 10/11/2012   CO2 30 10/11/2012   TSH 1.45 01/25/2010   PSA 2.17 01/25/2010   HGBA1C 6.1 10/11/2012         Assessment & Plan:

## 2014-06-19 ENCOUNTER — Telehealth: Payer: Self-pay | Admitting: Internal Medicine

## 2014-06-19 ENCOUNTER — Ambulatory Visit (INDEPENDENT_AMBULATORY_CARE_PROVIDER_SITE_OTHER): Admitting: Psychology

## 2014-06-19 DIAGNOSIS — F319 Bipolar disorder, unspecified: Secondary | ICD-10-CM

## 2014-06-19 NOTE — Telephone Encounter (Signed)
emmi emailed °

## 2014-07-03 ENCOUNTER — Other Ambulatory Visit (INDEPENDENT_AMBULATORY_CARE_PROVIDER_SITE_OTHER)

## 2014-07-03 DIAGNOSIS — E291 Testicular hypofunction: Secondary | ICD-10-CM

## 2014-07-03 DIAGNOSIS — F411 Generalized anxiety disorder: Secondary | ICD-10-CM

## 2014-07-03 DIAGNOSIS — F329 Major depressive disorder, single episode, unspecified: Secondary | ICD-10-CM

## 2014-07-03 DIAGNOSIS — G4733 Obstructive sleep apnea (adult) (pediatric): Secondary | ICD-10-CM

## 2014-07-03 LAB — TSH: TSH: 1.18 u[IU]/mL (ref 0.35–4.50)

## 2014-07-03 LAB — CBC WITH DIFFERENTIAL/PLATELET
Basophils Absolute: 0 10*3/uL (ref 0.0–0.1)
Basophils Relative: 0.6 % (ref 0.0–3.0)
EOS PCT: 2.5 % (ref 0.0–5.0)
Eosinophils Absolute: 0.2 10*3/uL (ref 0.0–0.7)
HEMATOCRIT: 48.1 % (ref 39.0–52.0)
Hemoglobin: 16 g/dL (ref 13.0–17.0)
LYMPHS ABS: 1.3 10*3/uL (ref 0.7–4.0)
Lymphocytes Relative: 22 % (ref 12.0–46.0)
MCHC: 33.3 g/dL (ref 30.0–36.0)
MCV: 90.7 fl (ref 78.0–100.0)
MONO ABS: 0.8 10*3/uL (ref 0.1–1.0)
MONOS PCT: 13.2 % — AB (ref 3.0–12.0)
Neutro Abs: 3.7 10*3/uL (ref 1.4–7.7)
Neutrophils Relative %: 61.7 % (ref 43.0–77.0)
Platelets: 252 10*3/uL (ref 150.0–400.0)
RBC: 5.3 Mil/uL (ref 4.22–5.81)
RDW: 15.2 % (ref 11.5–15.5)
WBC: 6.1 10*3/uL (ref 4.0–10.5)

## 2014-07-03 LAB — HEMOGLOBIN A1C: Hgb A1c MFr Bld: 6 % (ref 4.6–6.5)

## 2014-07-03 LAB — TESTOSTERONE: Testosterone: 96.6 ng/dL — ABNORMAL LOW (ref 300.00–890.00)

## 2014-07-04 LAB — BASIC METABOLIC PANEL
BUN: 17 mg/dL (ref 6–23)
CHLORIDE: 106 meq/L (ref 96–112)
CO2: 26 mEq/L (ref 19–32)
Calcium: 9.2 mg/dL (ref 8.4–10.5)
Creatinine, Ser: 1.2 mg/dL (ref 0.4–1.5)
GFR: 68.8 mL/min (ref >60.00)
GLUCOSE: 73 mg/dL (ref 70–99)
POTASSIUM: 4.6 meq/L (ref 3.5–5.1)
SODIUM: 141 meq/L (ref 135–145)

## 2014-07-04 LAB — HEPATIC FUNCTION PANEL
ALK PHOS: 51 U/L (ref 39–117)
ALT: 32 U/L (ref 0–53)
AST: 22 U/L (ref 0–37)
Albumin: 4.2 g/dL (ref 3.5–5.2)
BILIRUBIN DIRECT: 0 mg/dL (ref 0.0–0.3)
TOTAL PROTEIN: 6.7 g/dL (ref 6.0–8.3)
Total Bilirubin: 0.6 mg/dL (ref 0.2–1.2)

## 2014-07-07 ENCOUNTER — Ambulatory Visit (INDEPENDENT_AMBULATORY_CARE_PROVIDER_SITE_OTHER): Admitting: Internal Medicine

## 2014-07-07 ENCOUNTER — Encounter: Payer: Self-pay | Admitting: Internal Medicine

## 2014-07-07 VITALS — BP 130/82 | HR 57 | Temp 98.2°F | Ht 70.0 in | Wt 207.2 lb

## 2014-07-07 DIAGNOSIS — F319 Bipolar disorder, unspecified: Secondary | ICD-10-CM

## 2014-07-07 DIAGNOSIS — Z6379 Other stressful life events affecting family and household: Secondary | ICD-10-CM

## 2014-07-07 DIAGNOSIS — Z23 Encounter for immunization: Secondary | ICD-10-CM

## 2014-07-07 DIAGNOSIS — R072 Precordial pain: Secondary | ICD-10-CM

## 2014-07-07 DIAGNOSIS — R0789 Other chest pain: Secondary | ICD-10-CM

## 2014-07-07 DIAGNOSIS — I10 Essential (primary) hypertension: Secondary | ICD-10-CM

## 2014-07-07 MED ORDER — HYDROCODONE-ACETAMINOPHEN 5-325 MG PO TABS: 1.0000 | ORAL_TABLET | Freq: Two times a day (BID) | ORAL | Status: DC | PRN

## 2014-07-07 MED ORDER — DICLOFENAC-MISOPROSTOL 75-0.2 MG PO TBEC: 1.0000 | DELAYED_RELEASE_TABLET | Freq: Every day | ORAL | Status: DC

## 2014-07-07 MED ORDER — TRIAMCINOLONE ACETONIDE 0.5 % EX OINT: 1.0000 "application " | TOPICAL_OINTMENT | Freq: Two times a day (BID) | CUTANEOUS | Status: DC

## 2014-07-07 NOTE — Progress Notes (Signed)
Pre visit review using our clinic review tool, if applicable. No additional management support is needed unless otherwise documented below in the visit note. 

## 2014-07-07 NOTE — Assessment & Plan Note (Signed)
CL pending On ASA

## 2014-07-07 NOTE — Assessment & Plan Note (Signed)
Better   BP Readings from Last 3 Encounters:  07/07/14 130/82  06/18/14 158/100  05/27/14 125/73

## 2014-07-07 NOTE — Patient Instructions (Signed)
Go to ER if chest pain 

## 2014-07-07 NOTE — Progress Notes (Signed)
   Subjective:     Chest Pain  This is a recurrent problem. The current episode started 1 to 4 weeks ago. The onset quality is sudden. Episode frequency: x1. The problem has been waxing and waning. The pain is present in the substernal region. The pain is at a severity of 5/10. The pain is moderate. The quality of the pain is described as heavy. The pain does not radiate. Pertinent negatives include no back pain, cough, dizziness, nausea, palpitations or weakness.    F/u elev BP: SBP 150-170 F/u CP on Monday x 5 min - 2 episodes, last on last Fri  The patient is here to follow up on chronic B soulder pain, depression, anxiety, headaches and chronic moderate fibromyalgia symptoms controlled with medicines, diet and exercise. He is separated - in the process of getting a divorce.   Son [name] [name] lives w/[name] [name] every other week...  [name] [name] had a shoulder surgery over Easter - he had a n allergic reaction - red skin, red eyes...  BP Readings from Last 3 Encounters:  07/07/14 130/82  06/18/14 158/100  05/27/14 125/73   Wt Readings from Last 3 Encounters:  07/07/14 207 lb 4 oz (94.008 kg)  06/18/14 209 lb (94.802 kg)  05/27/14 218 lb (98.884 kg)      Review of Systems  Constitutional: Positive for fatigue. Negative for appetite change and unexpected weight change.  HENT: Negative for congestion, nosebleeds, sneezing, sore throat and trouble swallowing.   Eyes: Negative for itching and visual disturbance.  Respiratory: Negative for cough.   Cardiovascular: Negative for chest pain, palpitations and leg swelling.  Gastrointestinal: Negative for nausea, diarrhea, blood in stool and abdominal distention.  Genitourinary: Negative for frequency and hematuria.  Musculoskeletal: Negative for back pain, joint swelling, gait problem and neck pain.  Skin: Negative for rash.  Neurological: Negative for dizziness, tremors, speech difficulty and weakness.  Psychiatric/Behavioral: Positive for sleep  disturbance. Negative for suicidal ideas, dysphoric mood and agitation. The patient is nervous/anxious.   dry skin on fingers, scaly     Objective:   Physical Exam  Lab Results  Component Value Date   WBC 6.1 07/03/2014   HGB 16.0 07/03/2014   HCT 48.1 07/03/2014   PLT 252.0 07/03/2014   GLUCOSE 73 07/03/2014   CHOL 199 10/11/2012   TRIG 88.0 10/11/2012   HDL 41.70 10/11/2012   LDLDIRECT 77.5 12/26/2008   LDLCALC 140* 10/11/2012   ALT 32 07/03/2014   AST 22 07/03/2014   NA 141 07/03/2014   K 4.6 07/03/2014   CL 106 07/03/2014   CREATININE 1.2 07/03/2014   BUN 17 07/03/2014   CO2 26 07/03/2014   TSH 1.18 07/03/2014   PSA 2.17 01/25/2010   HGBA1C 6.0 07/03/2014         Assessment & Plan:

## 2014-07-07 NOTE — Assessment & Plan Note (Signed)
In the process of divorce

## 2014-07-07 NOTE — Assessment & Plan Note (Signed)
Chronic Dr Clovis Pu, Dr Cheryln Manly

## 2014-07-14 ENCOUNTER — Other Ambulatory Visit (HOSPITAL_COMMUNITY)

## 2014-07-24 ENCOUNTER — Other Ambulatory Visit (HOSPITAL_COMMUNITY)

## 2014-07-25 ENCOUNTER — Other Ambulatory Visit (HOSPITAL_COMMUNITY)

## 2014-07-29 ENCOUNTER — Ambulatory Visit (INDEPENDENT_AMBULATORY_CARE_PROVIDER_SITE_OTHER): Admitting: Psychology

## 2014-07-29 DIAGNOSIS — F319 Bipolar disorder, unspecified: Secondary | ICD-10-CM

## 2014-07-31 ENCOUNTER — Ambulatory Visit (HOSPITAL_COMMUNITY): Attending: Cardiology

## 2014-07-31 ENCOUNTER — Ambulatory Visit (HOSPITAL_BASED_OUTPATIENT_CLINIC_OR_DEPARTMENT_OTHER): Admitting: Radiology

## 2014-07-31 DIAGNOSIS — G4733 Obstructive sleep apnea (adult) (pediatric): Secondary | ICD-10-CM | POA: Insufficient documentation

## 2014-07-31 DIAGNOSIS — R072 Precordial pain: Secondary | ICD-10-CM | POA: Insufficient documentation

## 2014-07-31 DIAGNOSIS — E785 Hyperlipidemia, unspecified: Secondary | ICD-10-CM | POA: Insufficient documentation

## 2014-07-31 DIAGNOSIS — R0789 Other chest pain: Secondary | ICD-10-CM

## 2014-07-31 DIAGNOSIS — I10 Essential (primary) hypertension: Secondary | ICD-10-CM

## 2014-07-31 DIAGNOSIS — R0989 Other specified symptoms and signs involving the circulatory and respiratory systems: Secondary | ICD-10-CM

## 2014-07-31 NOTE — Progress Notes (Signed)
Stress Echocardiogram performed.  

## 2014-09-04 ENCOUNTER — Other Ambulatory Visit: Payer: Self-pay | Admitting: Internal Medicine

## 2014-11-04 ENCOUNTER — Encounter: Payer: Self-pay | Admitting: Pulmonary Disease

## 2014-11-04 ENCOUNTER — Ambulatory Visit (INDEPENDENT_AMBULATORY_CARE_PROVIDER_SITE_OTHER): Admitting: Pulmonary Disease

## 2014-11-04 DIAGNOSIS — G4733 Obstructive sleep apnea (adult) (pediatric): Secondary | ICD-10-CM | POA: Diagnosis not present

## 2014-11-04 NOTE — Assessment & Plan Note (Signed)
The patient is doing very well on his current C set up, and is satisfied with his sleep and daytime alertness. I have encouraged him to work aggressively on weight loss, and to keep up with his mask changes and supplies.

## 2014-11-04 NOTE — Patient Instructions (Signed)
Stay on cpap, and keep up with mask changes and supplies. Work on [employer] reduction Will send an order to your home care company to set your travel machine on 13cm.  followup with me again in one year if doing well.

## 2014-11-04 NOTE — Progress Notes (Signed)
   Subjective:    Patient ID: [name] [name], male    DOB: 1955/12/01, 59 y.o.   MRN: [id number]  HPI Patient comes in today for follow-up of his obstructive sleep apnea. He is wearing C compliantly, and is having no issues with his mask fit or pressure. He is satisfied with his sleep and daytime alertness.   Review of Systems  Constitutional: Negative for fever, chills, activity change, appetite change and unexpected weight change.  HENT: Negative for congestion, dental problem, ear pain, nosebleeds, postnasal drip, rhinorrhea, sinus pressure, sneezing, sore throat, trouble swallowing and voice change.   Eyes: Negative for redness, itching and visual disturbance.  Respiratory: Positive for shortness of breath. Negative for cough, choking, chest tightness and wheezing.   Cardiovascular: Negative for chest pain, palpitations and leg swelling.  Gastrointestinal: Negative for nausea, vomiting and abdominal pain.  Genitourinary: Negative for dysuria and difficulty urinating.  Musculoskeletal: Negative for joint swelling and arthralgias.  Skin: Negative for rash.  Neurological: Negative for headaches.  Hematological: Does not bruise/bleed easily.  Psychiatric/Behavioral: Negative for behavioral problems, confusion and dysphoric mood. The patient is not nervous/anxious.        Objective:   Physical Exam Overweight male in no acute distress Nose without purulence or discharge noted Neck without lymphadenopathy or thyromegaly Chest totally clear to auscultation, no wheezing Cardiac exam with regular rate and rhythm Lower extremities without edema, no cyanosis Alert and oriented, moves all 4 extremities.       Assessment & Plan:

## 2014-11-06 ENCOUNTER — Ambulatory Visit: Admitting: Internal Medicine

## 2014-11-18 ENCOUNTER — Ambulatory Visit (INDEPENDENT_AMBULATORY_CARE_PROVIDER_SITE_OTHER): Admitting: Psychology

## 2014-11-18 DIAGNOSIS — F319 Bipolar disorder, unspecified: Secondary | ICD-10-CM | POA: Diagnosis not present

## 2014-11-28 ENCOUNTER — Other Ambulatory Visit (INDEPENDENT_AMBULATORY_CARE_PROVIDER_SITE_OTHER)

## 2014-11-28 ENCOUNTER — Ambulatory Visit (INDEPENDENT_AMBULATORY_CARE_PROVIDER_SITE_OTHER): Admitting: Internal Medicine

## 2014-11-28 ENCOUNTER — Ambulatory Visit (INDEPENDENT_AMBULATORY_CARE_PROVIDER_SITE_OTHER)
Admission: RE | Admit: 2014-11-28 | Discharge: 2014-11-28 | Disposition: A | Discharge status: Home / Self Care | Source: Ambulatory Visit | Attending: Internal Medicine | Admitting: Internal Medicine

## 2014-11-28 ENCOUNTER — Encounter: Payer: Self-pay | Admitting: Internal Medicine

## 2014-11-28 VITALS — BP 138/89 | HR 70 | Wt 207.0 lb

## 2014-11-28 DIAGNOSIS — N320 Bladder-neck obstruction: Secondary | ICD-10-CM | POA: Diagnosis not present

## 2014-11-28 DIAGNOSIS — I10 Essential (primary) hypertension: Secondary | ICD-10-CM

## 2014-11-28 DIAGNOSIS — E291 Testicular hypofunction: Secondary | ICD-10-CM | POA: Diagnosis not present

## 2014-11-28 DIAGNOSIS — R072 Precordial pain: Secondary | ICD-10-CM | POA: Diagnosis not present

## 2014-11-28 DIAGNOSIS — K2100 Gastro-esophageal reflux disease with esophagitis, without bleeding: Secondary | ICD-10-CM

## 2014-11-28 DIAGNOSIS — R0789 Other chest pain: Secondary | ICD-10-CM

## 2014-11-28 DIAGNOSIS — R7989 Other specified abnormal findings of blood chemistry: Secondary | ICD-10-CM

## 2014-11-28 DIAGNOSIS — E785 Hyperlipidemia, unspecified: Secondary | ICD-10-CM

## 2014-11-28 LAB — HEPATIC FUNCTION PANEL
ALBUMIN: 4.3 g/dL (ref 3.5–5.2)
ALT: 28 U/L (ref 0–53)
AST: 17 U/L (ref 0–37)
Alkaline Phosphatase: 45 U/L (ref 39–117)
BILIRUBIN TOTAL: 0.6 mg/dL (ref 0.2–1.2)
Bilirubin, Direct: 0.1 mg/dL (ref 0.0–0.3)
Total Protein: 7.4 g/dL (ref 6.0–8.3)

## 2014-11-28 LAB — BASIC METABOLIC PANEL
BUN: 32 mg/dL — ABNORMAL HIGH (ref 6–23)
CALCIUM: 9.6 mg/dL (ref 8.4–10.5)
CO2: 30 meq/L (ref 19–32)
Chloride: 102 mEq/L (ref 96–112)
Creatinine, Ser: 1.09 mg/dL (ref 0.40–1.50)
GFR: 73.09 mL/min (ref >60.00)
Glucose, Bld: 100 mg/dL — ABNORMAL HIGH (ref 70–99)
Potassium: 4.7 mEq/L (ref 3.5–5.1)
SODIUM: 138 meq/L (ref 135–145)

## 2014-11-28 LAB — CBC WITH DIFFERENTIAL/PLATELET
BASOS ABS: 0 10*3/uL (ref 0.0–0.1)
Basophils Relative: 0.3 % (ref 0.0–3.0)
Eosinophils Absolute: 0.1 10*3/uL (ref 0.0–0.7)
Eosinophils Relative: 2.3 % (ref 0.0–5.0)
HEMATOCRIT: 51.2 % (ref 39.0–52.0)
Hemoglobin: 17.3 g/dL — ABNORMAL HIGH (ref 13.0–17.0)
LYMPHS ABS: 0.9 10*3/uL (ref 0.7–4.0)
Lymphocytes Relative: 18.5 % (ref 12.0–46.0)
MCHC: 33.7 g/dL (ref 30.0–36.0)
MCV: 92.1 fl (ref 78.0–100.0)
Monocytes Absolute: 0.6 10*3/uL (ref 0.1–1.0)
Monocytes Relative: 12.1 % — ABNORMAL HIGH (ref 3.0–12.0)
NEUTROS ABS: 3.1 10*3/uL (ref 1.4–7.7)
Neutrophils Relative %: 66.8 % (ref 43.0–77.0)
Platelets: 223 10*3/uL (ref 150.0–400.0)
RBC: 5.56 Mil/uL (ref 4.22–5.81)
RDW: 13.8 % (ref 11.5–15.5)
WBC: 4.7 10*3/uL (ref 4.0–10.5)

## 2014-11-28 LAB — LIPID PANEL
CHOL/HDL RATIO: 4
CHOLESTEROL: 222 mg/dL — AB (ref 0–200)
HDL: 49.9 mg/dL (ref >39.00)
LDL Cholesterol: 155 mg/dL — ABNORMAL HIGH (ref 0–99)
NonHDL: 172.1
Triglycerides: 88 mg/dL (ref 0.0–149.0)
VLDL: 17.6 mg/dL (ref 0.0–40.0)

## 2014-11-28 LAB — URINALYSIS
Bilirubin Urine: NEGATIVE
Hgb urine dipstick: NEGATIVE
Ketones, ur: NEGATIVE
Leukocytes, UA: NEGATIVE
Nitrite: NEGATIVE
SPECIFIC GRAVITY, URINE: 1.025 (ref 1.000–1.030)
TOTAL PROTEIN, URINE-UPE24: NEGATIVE
URINE GLUCOSE: NEGATIVE
UROBILINOGEN UA: 0.2 (ref 0.0–1.0)
pH: 6.5 (ref 5.0–8.0)

## 2014-11-28 LAB — TSH: TSH: 1.95 u[IU]/mL (ref 0.35–4.50)

## 2014-11-28 MED ORDER — HYDROCODONE-ACETAMINOPHEN 5-325 MG PO TABS: 1.0000 | ORAL_TABLET | Freq: Two times a day (BID) | ORAL | Status: DC | PRN

## 2014-11-28 MED ORDER — CYCLOBENZAPRINE HCL 10 MG PO TABS: ORAL_TABLET | ORAL | Status: DC

## 2014-11-28 MED ORDER — TADALAFIL 5 MG PO TABS: 5.0000 mg | ORAL_TABLET | Freq: Every day | ORAL | Status: DC | PRN

## 2014-11-28 MED ORDER — PANTOPRAZOLE SODIUM 40 MG PO TBEC: 40.0000 mg | DELAYED_RELEASE_TABLET | Freq: Every day | ORAL | Status: DC

## 2014-11-28 MED ORDER — FLUTICASONE PROPIONATE 50 MCG/ACT NA SUSP: 1.0000 | NASAL | Status: DC | PRN

## 2014-11-28 MED ORDER — METOPROLOL SUCCINATE ER 25 MG PO TB24: 25.0000 mg | ORAL_TABLET | Freq: Every day | ORAL | Status: DC

## 2014-11-28 MED ORDER — CLONAZEPAM 1 MG PO TABS: 0.5000 mg | ORAL_TABLET | Freq: Two times a day (BID) | ORAL | Status: DC | PRN

## 2014-11-28 NOTE — Assessment & Plan Note (Signed)
Dr Risa Grill  On Androgel

## 2014-11-28 NOTE — Assessment & Plan Note (Signed)
Resolved CXR

## 2014-11-28 NOTE — Assessment & Plan Note (Signed)
Dr Risa Grill On daily Cialis

## 2014-11-28 NOTE — Progress Notes (Signed)
Pre visit review using our clinic review tool, if applicable. No additional management support is needed unless otherwise documented below in the visit note. 

## 2014-11-28 NOTE — Assessment & Plan Note (Signed)
Dr Carlean Purl Protonix po

## 2014-11-28 NOTE — Assessment & Plan Note (Signed)
  On diet  

## 2014-11-28 NOTE — Progress Notes (Signed)
   Subjective:     Chest Pain  This is a recurrent problem. The current episode started 1 to 4 weeks ago. The onset quality is sudden. Episode frequency: x1. The problem has been resolved. The pain is at a severity of 5/10. The pain does not radiate. Pertinent negatives include no back pain, cough, dizziness, nausea, palpitations or weakness.    F/u elev BP: SBP 150-170 F/u CP on Monday x 5 min - 2 episodes, last on last Fri  The patient is here to follow up on chronic B soulder pain, depression, anxiety, headaches and chronic moderate fibromyalgia symptoms controlled with medicines, diet and exercise. He is separated - in the process of getting a divorce.   Son Jenny Reichmann lives w/Rease every other week... (9th grade)  Rodney had a shoulder surgery over Easter - he had a n allergic reaction - red skin, red eyes...  BP Readings from Last 3 Encounters:  11/28/14 138/90  11/04/14 136/78  07/07/14 130/82   Wt Readings from Last 3 Encounters:  11/28/14 207 lb (93.895 kg)  11/04/14 210 lb 9.6 oz (95.528 kg)  07/07/14 207 lb 4 oz (94.008 kg)      Review of Systems  Constitutional: Positive for fatigue. Negative for appetite change and unexpected weight change.  HENT: Negative for congestion, nosebleeds, sneezing, sore throat and trouble swallowing.   Eyes: Negative for itching and visual disturbance.  Respiratory: Negative for cough.   Cardiovascular: Negative for chest pain, palpitations and leg swelling.  Gastrointestinal: Negative for nausea, diarrhea, blood in stool and abdominal distention.  Genitourinary: Negative for frequency and hematuria.  Musculoskeletal: Negative for back pain, joint swelling, gait problem and neck pain.  Skin: Negative for rash.  Neurological: Negative for dizziness, tremors, speech difficulty and weakness.  Psychiatric/Behavioral: Positive for sleep disturbance. Negative for suicidal ideas, dysphoric mood and agitation. The patient is nervous/anxious.   dry  skin on fingers, scaly     Objective:   Physical Exam  Lab Results  Component Value Date   WBC 6.1 07/03/2014   HGB 16.0 07/03/2014   HCT 48.1 07/03/2014   PLT 252.0 07/03/2014   GLUCOSE 73 07/03/2014   CHOL 199 10/11/2012   TRIG 88.0 10/11/2012   HDL 41.70 10/11/2012   LDLDIRECT 77.5 12/26/2008   LDLCALC 140* 10/11/2012   ALT 32 07/03/2014   AST 22 07/03/2014   NA 141 07/03/2014   K 4.6 07/03/2014   CL 106 07/03/2014   CREATININE 1.2 07/03/2014   BUN 17 07/03/2014   CO2 26 07/03/2014   TSH 1.18 07/03/2014   PSA 2.17 01/25/2010   HGBA1C 6.0 07/03/2014         Assessment & Plan:

## 2014-11-28 NOTE — Assessment & Plan Note (Signed)
Toprol po 

## 2015-01-19 ENCOUNTER — Ambulatory Visit (INDEPENDENT_AMBULATORY_CARE_PROVIDER_SITE_OTHER): Admitting: Internal Medicine

## 2015-01-19 VITALS — BP 110/64 | HR 58 | Ht 69.0 in | Wt 212.0 lb

## 2015-01-19 DIAGNOSIS — G4733 Obstructive sleep apnea (adult) (pediatric): Secondary | ICD-10-CM | POA: Diagnosis not present

## 2015-01-19 DIAGNOSIS — J300 Vasomotor rhinitis: Secondary | ICD-10-CM | POA: Diagnosis not present

## 2015-01-19 MED ORDER — AZELASTINE-FLUTICASONE 137-50 MCG/ACT NA SUSP: 2.0000 | Freq: Every day | NASAL | Status: DC

## 2015-01-19 NOTE — Progress Notes (Signed)
Subjective:    Patient ID: [name] [name], male    DOB: [birth year], 58 y.o.   MRN: [id number]  11/04/14- Dr Gwenette Greet- HPI Patient comes in today for follow-up of his obstructive sleep apnea. He is wearing C compliantly, and is having no issues with his mask fit or pressure. He is satisfied with his sleep and daytime alertness.  01/19/15- 59 yoM never smoker followed for OSA, rhinitis NPSG 03/20/07,  AHI 86.4/ hr, weight 202 lbs.     CPAP auto / [employer] Pt Reports:CPAP machine repaired on May 13, recently he's been  feeling like he's 'DROWNING' . Once hes taken sinus pill he can sleep better.. Still not sure what helps. He has to CPAP machines, 1 set on auto 7-10 at his girlfriend's house in [place] and the other set at fixed pressure 13 which he uses here. He thinks he prefers the fixed pressure. Fullface mask with both machines. Bothersome postnasal drainage with no lower respiratory complaints. Mostly a postnasal drip without much sneeze. At night this gives him a feeling of fluid in his throat "drowning". He doesn't distinguish between postnasal drainage and reflux without heart burn. CXR 11/28/14 IMPRESSION: Mild left base scarring or atelectasis. Otherwise negative. Electronically Signed  By: Skipper Cliche M.D.  On: 11/28/2014 15:36  ROS-see HPI   Negative unless "+" Constitutional:    weight loss, night sweats, fevers, chills, fatigue, lassitude. HEENT:    headaches, difficulty swallowing, tooth/dental problems, sore throat,       sneezing, itching, ear ache, nasal congestion, +post nasal drip, snoring CV:    chest pain, orthopnea, PND, swelling in lower extremities, anasarca,                                  dizziness, palpitations Resp:   shortness of breath with exertion or at rest.                productive cough,   non-productive cough, coughing up of blood.              change in color of mucus.  wheezing.   Skin:    rash or lesions. GI:  No-   heartburn, indigestion,  abdominal pain, nausea, vomiting,  GU:  MS:   joint pain, stiffness, decreased range of motion, back pain. Neuro-     nothing unusual Psych:  change in mood or affect.  depression or anxiety.   memory loss.      Objective:  OBJ- Physical Exam General- Alert, Oriented, Affect-appropriate, Distress- none acute Skin- rash-none, lesions- none, excoriation- none Lymphadenopathy- none Head- atraumatic            Eyes- Gross vision intact, PERRLA, conjunctivae and secretions clear            Ears- Hearing, canals-normal            Nose- Clear, no-Septal dev, mucus, polyps, erosion, perforation             Throat- Mallampati II , mucosa clear , drainage- none, tonsils- atrophic Neck- flexible , trachea midline, no stridor , thyroid nl, carotid no bruit Chest - symmetrical excursion , unlabored           Heart/CV- RRR , no murmur , no gallop  , no rub, nl s1 s2                           -  JVD- none , edema- none, stasis changes- none, varices- none           Lung- clear to P&A, wheeze- none, cough- none , dullness-none, rub- none           Chest wall-  Abd-  Br/ Gen/ Rectal- Not done, not indicated Extrem- cyanosis- none, clubbing, none, atrophy- none, strength- nl Neuro- grossly intact to observation         Assessment & Plan:

## 2015-01-19 NOTE — Patient Instructions (Addendum)
Ok to continue both CPAP machines as you have them, or reset one to be like the other  To sort out he "drwoning sensation" when you are lying down, Try: Elevate the head end of your bedframe, so your whole body tilts, about the height of a standard brick. This will reduce reflux upward.  Sample Dymista nasal spray   1-2 puffs each nostril once daily at bedtime. See if this seems to reduce postnasal drip.  Please call as needed

## 2015-01-20 ENCOUNTER — Encounter: Payer: Self-pay | Admitting: Internal Medicine

## 2015-01-20 DIAGNOSIS — J310 Chronic rhinitis: Secondary | ICD-10-CM | POA: Insufficient documentation

## 2015-01-20 NOTE — Assessment & Plan Note (Signed)
2 machines, one set auto 7-10 and the other set at fixed 13. We discussed getting the 2 machines reconciled through his DME company.

## 2015-01-20 NOTE — Assessment & Plan Note (Signed)
Physical complaint is postnasal drainage sensation that bothers him when he is lying down at night with CPAP on. Not clear if this is postnasal drainage or possibly bland reflux. Plan-treat both possibilities by elevating head of bed to reduce reflux and sample Dymista nasal spray.

## 2015-02-02 ENCOUNTER — Other Ambulatory Visit: Payer: Self-pay | Admitting: Internal Medicine

## 2015-02-02 ENCOUNTER — Telehealth: Payer: Self-pay | Admitting: Internal Medicine

## 2015-02-02 MED ORDER — AZELASTINE-FLUTICASONE 137-50 MCG/ACT NA SUSP: 2.0000 | Freq: Every day | NASAL | Status: DC

## 2015-02-02 NOTE — Telephone Encounter (Signed)
Both prescriptions have been sent in. Pt is aware. Nothing further was needed.

## 2015-02-04 ENCOUNTER — Other Ambulatory Visit: Payer: Self-pay

## 2015-02-04 MED ORDER — CYCLOBENZAPRINE HCL 10 MG PO TABS: ORAL_TABLET | ORAL | Status: DC

## 2015-02-04 NOTE — Telephone Encounter (Signed)
Flexeril rx sent to pharm

## 2015-02-19 ENCOUNTER — Ambulatory Visit: Admitting: Pulmonary Disease

## 2015-03-04 ENCOUNTER — Encounter: Payer: Self-pay | Admitting: Internal Medicine

## 2015-03-04 ENCOUNTER — Ambulatory Visit: Admitting: Internal Medicine

## 2015-03-04 ENCOUNTER — Ambulatory Visit (INDEPENDENT_AMBULATORY_CARE_PROVIDER_SITE_OTHER): Admitting: Internal Medicine

## 2015-03-04 VITALS — BP 138/84 | HR 59 | Temp 97.9°F | Wt 215.0 lb

## 2015-03-04 DIAGNOSIS — E669 Obesity, unspecified: Secondary | ICD-10-CM | POA: Diagnosis not present

## 2015-03-04 DIAGNOSIS — I10 Essential (primary) hypertension: Secondary | ICD-10-CM

## 2015-03-04 DIAGNOSIS — F319 Bipolar disorder, unspecified: Secondary | ICD-10-CM

## 2015-03-04 MED ORDER — CICLOPIROX 8 % EX SOLN: Freq: Every day | CUTANEOUS | Status: DC

## 2015-03-04 MED ORDER — LORCASERIN HCL 10 MG PO TABS: 1.0000 | ORAL_TABLET | Freq: Two times a day (BID) | ORAL | Status: DC

## 2015-03-04 NOTE — Progress Notes (Signed)
Pre visit review using our clinic review tool, if applicable. No additional management support is needed unless otherwise documented below in the visit note. 

## 2015-03-04 NOTE — Progress Notes (Signed)
Subjective:  Patient ID: [name] [name], male    DOB: 09/28/1955  Age: 59 y.o. MRN: [id number]  CC: No chief complaint on file.   HPI [name] [name] presents for shoulder pain C/o SOB w/activity. C/o toenail fungus. C/o obesity - wants to loose wt  Outpatient Prescriptions Prior to Visit  Medication Sig Dispense Refill  . aspirin 81 MG tablet Take 81 mg by mouth daily.     . Azelastine-Fluticasone (DYMISTA) 137-50 MCG/ACT SUSP Place 2 sprays into both nostrils at bedtime. 3 Bottle 1  . clonazePAM (KLONOPIN) 1 MG tablet Take 0.5 tablets (0.5 mg total) by mouth 2 (two) times daily as needed. 180 tablet 1  . cyclobenzaprine (FLEXERIL) 10 MG tablet TAKE 1 TABLET THREE TIMES A DAY AS NEEDED BETWEEN MEALS AND BEDTIME FOR MUSCLE SPASMS 90 tablet 0  . HYDROcodone-acetaminophen (NORCO/VICODIN) 5-325 MG per tablet Take 1 tablet by mouth 2 (two) times daily as needed for moderate pain or severe pain. 90 tablet 0  . meloxicam (MOBIC) 15 MG tablet Take 15 mg by mouth daily.    . metoprolol succinate (TOPROL-XL) 25 MG 24 hr tablet Take 1 tablet (25 mg total) by mouth daily. 90 tablet 3  . pantoprazole (PROTONIX) 40 MG tablet Take 1 tablet (40 mg total) by mouth daily before breakfast. 90 tablet 3  . tadalafil (CIALIS) 5 MG tablet Take 1 tablet (5 mg total) by mouth daily as needed for erectile dysfunction. 30 tablet 11  . Testosterone (ANDROGEL) 20.25 MG/1.25GM (1.62%) GEL Place onto the skin.    . fluticasone (FLONASE) 50 MCG/ACT nasal spray Place 1 spray into both nostrils as needed. (Patient not taking: Reported on 03/04/2015) 16 g 11  . triamcinolone ointment (KENALOG) 0.5 % Apply 1 application topically 2 (two) times daily. (Patient not taking: Reported on 03/04/2015) 30 g 0  . Methotrexate, PF, 10 MG/0.2ML SOAJ Inject 36 mg into the skin 2 (two) times daily.     No facility-administered medications prior to visit.    ROS Review of Systems  Constitutional: Positive for unexpected weight change.  Negative for appetite change and fatigue.  HENT: Negative for congestion, nosebleeds, sneezing, sore throat and trouble swallowing.   Eyes: Negative for itching and visual disturbance.  Respiratory: Negative for cough.   Cardiovascular: Negative for chest pain, palpitations and leg swelling.  Gastrointestinal: Negative for nausea, diarrhea, blood in stool and abdominal distention.  Genitourinary: Negative for frequency and hematuria.  Musculoskeletal: Positive for arthralgias, neck pain and neck stiffness. Negative for back pain, joint swelling and gait problem.  Skin: Negative for rash.  Neurological: Negative for dizziness, tremors, speech difficulty and weakness.  Psychiatric/Behavioral: Negative for sleep disturbance, dysphoric mood and agitation. The patient is nervous/anxious.     Objective:  BP 138/84 mmHg  Pulse 59  Temp(Src) 97.9 F (36.6 C) (Oral)  Wt 215 lb (97.523 kg)  SpO2 97%  BP Readings from Last 3 Encounters:  03/04/15 138/84  01/19/15 110/64  11/28/14 138/89    Wt Readings from Last 3 Encounters:  03/04/15 215 lb (97.523 kg)  01/19/15 212 lb (96.163 kg)  11/28/14 207 lb (93.895 kg)    Physical Exam  Constitutional: He is oriented to person, place, and time. He appears well-developed. No distress.  NAD  HENT:  Mouth/Throat: Oropharynx is clear and moist.  Eyes: Conjunctivae are normal. Pupils are equal, round, and reactive to light.  Neck: Normal range of motion. No JVD present. No thyromegaly present.  Cardiovascular: Normal rate,  regular rhythm, normal heart sounds and intact distal pulses.  Exam reveals no gallop and no friction rub.   No murmur heard. Pulmonary/Chest: Effort normal and breath sounds normal. No respiratory distress. He has no wheezes. He has no rales. He exhibits no tenderness.  Abdominal: Soft. Bowel sounds are normal. He exhibits no distension and no mass. There is no tenderness. There is no rebound and no guarding.  Musculoskeletal:  Normal range of motion. He exhibits tenderness. He exhibits no edema.  Lymphadenopathy:    He has no cervical adenopathy.  Neurological: He is alert and oriented to person, place, and time. He has normal reflexes. No cranial nerve deficit. He exhibits normal muscle tone. He displays a negative Romberg sign. Coordination and gait normal.  Skin: Skin is warm and dry. No rash noted.  Psychiatric: He has a normal mood and affect. His behavior is normal. Judgment and thought content normal.  Neck, shoulder - tender R ear wax - small  Lab Results  Component Value Date   WBC 4.7 11/28/2014   HGB 17.3* 11/28/2014   HCT 51.2 11/28/2014   PLT 223.0 11/28/2014   GLUCOSE 100* 11/28/2014   CHOL 222* 11/28/2014   TRIG 88.0 11/28/2014   HDL 49.90 11/28/2014   LDLDIRECT 77.5 12/26/2008   LDLCALC 155* 11/28/2014   ALT 28 11/28/2014   AST 17 11/28/2014   NA 138 11/28/2014   K 4.7 11/28/2014   CL 102 11/28/2014   CREATININE 1.09 11/28/2014   BUN 32* 11/28/2014   CO2 30 11/28/2014   TSH 1.95 11/28/2014   PSA 2.17 01/25/2010   HGBA1C 6.0 07/03/2014    Dg Chest 2 View  11/28/2014   CLINICAL DATA:  Chest pain shortness of breath, shortness of breath on exertion for 2 months, nonsmoker  EXAM: CHEST  2 VIEW  COMPARISON:  None.  FINDINGS: Heart size and vascular pattern normal. Lungs clear except for mild scarring or atelectasis left lung base. No evidence of infiltrate or effusion.  IMPRESSION: Mild left base scarring or atelectasis.  Otherwise negative.   Electronically Signed   By: Skipper Cliche M.D.   On: 11/28/2014 15:36   2015 stress test     Assessment & Plan:   Diagnoses and all orders for this visit:  Essential hypertension  Obesity  BIPOLAR DEPRESSION  Other orders -     Discontinue: Lorcaserin HCl (BELVIQ) 10 MG TABS; Take 1 tablet by mouth 2 (two) times daily. -     Lorcaserin HCl (BELVIQ) 10 MG TABS; Take 1 tablet by mouth 2 (two) times daily. -     ciclopirox (PENLAC) 8 %  solution; Apply topically at bedtime. Apply over nail and surrounding skin. Apply daily over previous coat. After seven (7) days, may remove with alcohol and continue cycle.  I have discontinued Mr. Sturtevant Methotrexate (PF). I am also having him start on ciclopirox. Additionally, I am having him maintain his aspirin, Testosterone, triamcinolone ointment, clonazePAM, fluticasone, HYDROcodone-acetaminophen, meloxicam, tadalafil, metoprolol succinate, pantoprazole, Azelastine-Fluticasone, cyclobenzaprine, and Lorcaserin HCl.  Meds ordered this encounter  Medications  . DISCONTD: Lorcaserin HCl (BELVIQ) 10 MG TABS    Sig: Take 1 tablet by mouth 2 (two) times daily.    Dispense:  30 tablet    Refill:  0  . Lorcaserin HCl (BELVIQ) 10 MG TABS    Sig: Take 1 tablet by mouth 2 (two) times daily.    Dispense:  60 tablet    Refill:  3  . ciclopirox (PENLAC) 8 %  solution    Sig: Apply topically at bedtime. Apply over nail and surrounding skin. Apply daily over previous coat. After seven (7) days, may remove with alcohol and continue cycle.    Dispense:  6.6 mL    Refill:  0     Follow-up: Return in about 3 months (around 06/04/2015) for a follow-up visit.  Walker Kehr, MD

## 2015-03-04 NOTE — Assessment & Plan Note (Signed)
On Toprol XL

## 2015-03-04 NOTE — Assessment & Plan Note (Signed)
Will try Belviq

## 2015-03-04 NOTE — Assessment & Plan Note (Signed)
Doing well 

## 2015-03-05 ENCOUNTER — Other Ambulatory Visit: Payer: Self-pay | Admitting: Internal Medicine

## 2015-04-07 ENCOUNTER — Encounter: Payer: Self-pay | Admitting: Internal Medicine

## 2015-04-07 ENCOUNTER — Ambulatory Visit (INDEPENDENT_AMBULATORY_CARE_PROVIDER_SITE_OTHER): Admitting: Internal Medicine

## 2015-04-07 VITALS — BP 136/88 | HR 56 | Temp 98.7°F | Ht 69.0 in | Wt 215.0 lb

## 2015-04-07 DIAGNOSIS — J209 Acute bronchitis, unspecified: Secondary | ICD-10-CM

## 2015-04-07 DIAGNOSIS — Z111 Encounter for screening for respiratory tuberculosis: Secondary | ICD-10-CM

## 2015-04-07 DIAGNOSIS — M159 Polyosteoarthritis, unspecified: Secondary | ICD-10-CM

## 2015-04-07 DIAGNOSIS — M150 Primary generalized (osteo)arthritis: Secondary | ICD-10-CM | POA: Diagnosis not present

## 2015-04-07 MED ORDER — HYDROCODONE-ACETAMINOPHEN 5-325 MG PO TABS: 1.0000 | ORAL_TABLET | Freq: Two times a day (BID) | ORAL | Status: DC | PRN

## 2015-04-07 MED ORDER — AZITHROMYCIN 250 MG PO TABS: ORAL_TABLET | ORAL | Status: DC

## 2015-04-07 MED ORDER — BENZONATATE 200 MG PO CAPS: 200.0000 mg | ORAL_CAPSULE | Freq: Two times a day (BID) | ORAL | Status: DC | PRN

## 2015-04-07 NOTE — Progress Notes (Signed)
Pre visit review using our clinic review tool, if applicable. No additional management support is needed unless otherwise documented below in the visit note. 

## 2015-04-07 NOTE — Assessment & Plan Note (Signed)
Chronic Norco prn  Potential benefits of a long term opioids use as well as potential risks (i.e. addiction risk, apnea etc) and complications (i.e. Somnolence, constipation and others) were explained to the patient and were aknowledged. 

## 2015-04-07 NOTE — Progress Notes (Signed)
Subjective:  Patient ID: [name] [name], male    DOB: Jan 24, 1956  Age: 59 y.o. MRN: [id number]  CC: No chief complaint on file.   HPI Eulogio Requena Buske presents for dry cough x 3 weeks - not better. OTC meds do not help...  Outpatient Prescriptions Prior to Visit  Medication Sig Dispense Refill  . aspirin 81 MG tablet Take 81 mg by mouth daily.     . Azelastine-Fluticasone (DYMISTA) 137-50 MCG/ACT SUSP Place 2 sprays into both nostrils at bedtime. 3 Bottle 1  . ciclopirox (PENLAC) 8 % solution Apply topically at bedtime. Apply over nail and surrounding skin. Apply daily over previous coat. After seven (7) days, may remove with alcohol and continue cycle. 6.6 mL 0  . clonazePAM (KLONOPIN) 1 MG tablet Take 0.5 tablets (0.5 mg total) by mouth 2 (two) times daily as needed. 180 tablet 1  . cyclobenzaprine (FLEXERIL) 10 MG tablet Take 1 tablet (10 mg total) by mouth 3 (three) times daily as needed for muscle spasms. 90 tablet 1  . HYDROcodone-acetaminophen (NORCO/VICODIN) 5-325 MG per tablet Take 1 tablet by mouth 2 (two) times daily as needed for moderate pain or severe pain. 90 tablet 0  . metoprolol succinate (TOPROL-XL) 25 MG 24 hr tablet Take 1 tablet (25 mg total) by mouth daily. 90 tablet 3  . pantoprazole (PROTONIX) 40 MG tablet Take 1 tablet (40 mg total) by mouth daily before breakfast. 90 tablet 3  . tadalafil (CIALIS) 5 MG tablet Take 1 tablet (5 mg total) by mouth daily as needed for erectile dysfunction. 30 tablet 11  . Testosterone (ANDROGEL) 20.25 MG/1.25GM (1.62%) GEL Place onto the skin.    Marland Kitchen triamcinolone ointment (KENALOG) 0.5 % Apply 1 application topically 2 (two) times daily. 30 g 0  . fluticasone (FLONASE) 50 MCG/ACT nasal spray Place 1 spray into both nostrils as needed. (Patient not taking: Reported on 04/07/2015) 16 g 11  . Lorcaserin HCl (BELVIQ) 10 MG TABS Take 1 tablet by mouth 2 (two) times daily. (Patient not taking: Reported on 04/07/2015) 60 tablet 3  . meloxicam (MOBIC)  15 MG tablet Take 15 mg by mouth daily.     No facility-administered medications prior to visit.    ROS Review of Systems  Constitutional: Negative for appetite change, fatigue and unexpected weight change.  HENT: Negative for congestion, nosebleeds, rhinorrhea, sneezing, sore throat, trouble swallowing and voice change.   Eyes: Negative for itching and visual disturbance.  Respiratory: Positive for cough and wheezing.   Cardiovascular: Negative for chest pain, palpitations and leg swelling.  Gastrointestinal: Negative for nausea, diarrhea, blood in stool and abdominal distention.  Genitourinary: Negative for frequency and hematuria.  Musculoskeletal: Positive for arthralgias and gait problem. Negative for back pain, joint swelling and neck pain.  Skin: Negative for rash.  Neurological: Negative for dizziness, tremors, speech difficulty and weakness.  Psychiatric/Behavioral: Negative for suicidal ideas, sleep disturbance, dysphoric mood and agitation. The patient is not nervous/anxious.     Objective:  BP 136/88 mmHg  Pulse 56  Temp(Src) 98.7 F (37.1 C) (Oral)  Ht 5\' 9"  (1.753 m)  Wt 215 lb (97.523 kg)  BMI 31.74 kg/m2  SpO2 95%  BP Readings from Last 3 Encounters:  04/07/15 136/88  03/04/15 138/84  01/19/15 110/64    Wt Readings from Last 3 Encounters:  04/07/15 215 lb (97.523 kg)  03/04/15 215 lb (97.523 kg)  01/19/15 212 lb (96.163 kg)    Physical Exam  Constitutional: He is oriented  to person, place, and time. He appears well-developed. No distress.  NAD  HENT:  Mouth/Throat: Oropharynx is clear and moist.  Eyes: Conjunctivae are normal. Pupils are equal, round, and reactive to light.  Neck: Normal range of motion. No JVD present. No thyromegaly present.  Cardiovascular: Normal rate, regular rhythm, normal heart sounds and intact distal pulses.  Exam reveals no gallop and no friction rub.   No murmur heard. Pulmonary/Chest: Effort normal and breath sounds  normal. No respiratory distress. He has no wheezes. He has no rales. He exhibits no tenderness.  Abdominal: Soft. Bowel sounds are normal. He exhibits no distension and no mass. There is no tenderness. There is no rebound and no guarding.  Musculoskeletal: Normal range of motion. He exhibits tenderness. He exhibits no edema.  Lymphadenopathy:    He has no cervical adenopathy.  Neurological: He is alert and oriented to person, place, and time. He has normal reflexes. No cranial nerve deficit. He exhibits normal muscle tone. He displays a negative Romberg sign. Coordination and gait normal.  Skin: Skin is warm and dry. No rash noted.  Psychiatric: He has a normal mood and affect. His behavior is normal. Judgment and thought content normal.  R knee is in a brace  Lab Results  Component Value Date   WBC 4.7 11/28/2014   HGB 17.3* 11/28/2014   HCT 51.2 11/28/2014   PLT 223.0 11/28/2014   GLUCOSE 100* 11/28/2014   CHOL 222* 11/28/2014   TRIG 88.0 11/28/2014   HDL 49.90 11/28/2014   LDLDIRECT 77.5 12/26/2008   LDLCALC 155* 11/28/2014   ALT 28 11/28/2014   AST 17 11/28/2014   NA 138 11/28/2014   K 4.7 11/28/2014   CL 102 11/28/2014   CREATININE 1.09 11/28/2014   BUN 32* 11/28/2014   CO2 30 11/28/2014   TSH 1.95 11/28/2014   PSA 2.17 01/25/2010   HGBA1C 6.0 07/03/2014    Dg Chest 2 View  11/28/2014   CLINICAL DATA:  Chest pain shortness of breath, shortness of breath on exertion for 2 months, nonsmoker  EXAM: CHEST  2 VIEW  COMPARISON:  None.  FINDINGS: Heart size and vascular pattern normal. Lungs clear except for mild scarring or atelectasis left lung base. No evidence of infiltrate or effusion.  IMPRESSION: Mild left base scarring or atelectasis.  Otherwise negative.   Electronically Signed   By: Skipper Cliche M.D.   On: 11/28/2014 15:36    Assessment & Plan:   There are no diagnoses linked to this encounter. I am having Mr. Vandegrift maintain his aspirin, Testosterone, triamcinolone  ointment, clonazePAM, fluticasone, HYDROcodone-acetaminophen, meloxicam, tadalafil, metoprolol succinate, pantoprazole, Azelastine-Fluticasone, Lorcaserin HCl, ciclopirox, cyclobenzaprine, celecoxib, and lidocaine.  Meds ordered this encounter  Medications  . celecoxib (CELEBREX) 200 MG capsule    Sig: TAKE ONE CAPSULE TWICE A DAY AS NEEDED    Refill:  0  . lidocaine (XYLOCAINE) 5 % ointment    Sig:      Follow-up: No Follow-up on file.  Walker Kehr, MD

## 2015-04-09 LAB — TB SKIN TEST
Induration: 0 mm
TB Skin Test: NEGATIVE

## 2015-04-10 ENCOUNTER — Telehealth: Payer: Self-pay

## 2015-04-10 NOTE — Telephone Encounter (Signed)
Immunization paperwork faxed and filled out for pt. OG copy at the front for pickup

## 2015-04-22 ENCOUNTER — Ambulatory Visit: Payer: Self-pay | Admitting: Internal Medicine

## 2015-05-05 ENCOUNTER — Other Ambulatory Visit: Payer: Self-pay | Admitting: Internal Medicine

## 2015-05-05 ENCOUNTER — Telehealth: Payer: Self-pay | Admitting: Internal Medicine

## 2015-05-05 DIAGNOSIS — Z0279 Encounter for issue of other medical certificate: Secondary | ICD-10-CM

## 2015-05-05 MED ORDER — CEFUROXIME AXETIL 500 MG PO TABS: 500.0000 mg | ORAL_TABLET | Freq: Two times a day (BID) | ORAL | Status: DC

## 2015-05-05 NOTE — Telephone Encounter (Signed)
Pt was in a few weeks ago and he is still no better and is coughing up green stuff. Do you want to see him again or call something else in for him Pharmacy is [facility] on [address]. Please advise

## 2015-05-05 NOTE — Telephone Encounter (Signed)
Emailed Ceftin Rx x 10 d Thx

## 2015-05-06 NOTE — Telephone Encounter (Signed)
Notified pt md has sent in antibiotic to pharmacy...[name] [name]

## 2015-05-08 NOTE — Telephone Encounter (Signed)
Patient called to advise that the ceftin made his forehead sore and his eyes heavy. He states that he has stopped taking the medication.

## 2015-06-16 ENCOUNTER — Encounter: Payer: Self-pay | Admitting: Internal Medicine

## 2015-07-06 ENCOUNTER — Encounter: Payer: Self-pay | Admitting: Internal Medicine

## 2015-07-06 ENCOUNTER — Ambulatory Visit (INDEPENDENT_AMBULATORY_CARE_PROVIDER_SITE_OTHER): Admitting: Internal Medicine

## 2015-07-06 VITALS — BP 122/80 | HR 69 | Wt 211.0 lb

## 2015-07-06 DIAGNOSIS — I10 Essential (primary) hypertension: Secondary | ICD-10-CM

## 2015-07-06 DIAGNOSIS — J329 Chronic sinusitis, unspecified: Secondary | ICD-10-CM | POA: Insufficient documentation

## 2015-07-06 DIAGNOSIS — J320 Chronic maxillary sinusitis: Secondary | ICD-10-CM

## 2015-07-06 DIAGNOSIS — Z23 Encounter for immunization: Secondary | ICD-10-CM | POA: Diagnosis not present

## 2015-07-06 DIAGNOSIS — M722 Plantar fascial fibromatosis: Secondary | ICD-10-CM | POA: Diagnosis not present

## 2015-07-06 DIAGNOSIS — G4733 Obstructive sleep apnea (adult) (pediatric): Secondary | ICD-10-CM | POA: Diagnosis not present

## 2015-07-06 DIAGNOSIS — Z9989 Dependence on other enabling machines and devices: Secondary | ICD-10-CM

## 2015-07-06 MED ORDER — CLONAZEPAM 1 MG PO TABS: 0.5000 mg | ORAL_TABLET | Freq: Two times a day (BID) | ORAL | Status: DC | PRN

## 2015-07-06 MED ORDER — METHYLPREDNISOLONE ACETATE 40 MG/ML IJ SUSP
40.0000 mg | Freq: Once | INTRAMUSCULAR | Status: AC
  Administered 2015-07-08: 40 mg via INTRA_ARTICULAR

## 2015-07-06 MED ORDER — AMOXICILLIN-POT CLAVULANATE 875-125 MG PO TABS
1.0000 | ORAL_TABLET | Freq: Two times a day (BID) | ORAL | Status: DC
Start: 2015-07-06 — End: 2015-09-08

## 2015-07-06 MED ORDER — AZELASTINE-FLUTICASONE 137-50 MCG/ACT NA SUSP: 2.0000 | Freq: Every day | NASAL | Status: DC

## 2015-07-06 MED ORDER — HYDROCODONE-ACETAMINOPHEN 5-325 MG PO TABS: 1.0000 | ORAL_TABLET | Freq: Two times a day (BID) | ORAL | Status: DC | PRN

## 2015-07-06 MED ORDER — ASPIRIN 325 MG PO TABS
325.0000 mg | ORAL_TABLET | Freq: Every day | ORAL | Status: DC
Start: 2015-07-06 — End: 2016-09-18

## 2015-07-06 NOTE — Assessment & Plan Note (Signed)
Options discussed See procedure Arch supports, ice

## 2015-07-06 NOTE — Progress Notes (Signed)
Subjective:  Patient ID: [name] [name], male    DOB: [birth year]  Age: 59 y.o. MRN: [id number]  CC: No chief complaint on file.   HPI [name] [name] presents for HAs, drainage from sinuses. C/o elev Hgb. F/u HTN. C/o L heel pain x weeks  Outpatient Prescriptions Prior to Visit  Medication Sig Dispense Refill  . benzonatate (TESSALON) 200 MG capsule Take 1 capsule (200 mg total) by mouth 2 (two) times daily as needed for cough. 60 capsule 0  . celecoxib (CELEBREX) 200 MG capsule TAKE ONE CAPSULE TWICE A DAY AS NEEDED  0  . ciclopirox (PENLAC) 8 % solution Apply topically at bedtime. Apply over nail and surrounding skin. Apply daily over previous coat. After seven (7) days, may remove with alcohol and continue cycle. 6.6 mL 0  . cyclobenzaprine (FLEXERIL) 10 MG tablet Take 1 tablet (10 mg total) by mouth 3 (three) times daily as needed for muscle spasms. 90 tablet 1  . fluticasone (FLONASE) 50 MCG/ACT nasal spray Place 1 spray into both nostrils as needed. 16 g 11  . lidocaine (XYLOCAINE) 5 % ointment     . meloxicam (MOBIC) 15 MG tablet Take 15 mg by mouth daily.    . metoprolol succinate (TOPROL-XL) 25 MG 24 hr tablet Take 1 tablet (25 mg total) by mouth daily. 90 tablet 3  . pantoprazole (PROTONIX) 40 MG tablet Take 1 tablet (40 mg total) by mouth daily before breakfast. 90 tablet 3  . tadalafil (CIALIS) 5 MG tablet Take 1 tablet (5 mg total) by mouth daily as needed for erectile dysfunction. 30 tablet 11  . Testosterone (ANDROGEL) 20.25 MG/1.25GM (1.62%) GEL Place onto the skin.    Marland Kitchen triamcinolone ointment (KENALOG) 0.5 % Apply 1 application topically 2 (two) times daily. 30 g 0  . aspirin 81 MG tablet Take 81 mg by mouth daily.     . Azelastine-Fluticasone (DYMISTA) 137-50 MCG/ACT SUSP Place 2 sprays into both nostrils at bedtime. 3 Bottle 1  . cefUROXime (CEFTIN) 500 MG tablet Take 1 tablet (500 mg total) by mouth 2 (two) times daily. 20 tablet 0  . clonazePAM (KLONOPIN) 1 MG tablet Take  0.5 tablets (0.5 mg total) by mouth 2 (two) times daily as needed. 180 tablet 1  . HYDROcodone-acetaminophen (NORCO/VICODIN) 5-325 MG per tablet Take 1 tablet by mouth 2 (two) times daily as needed for moderate pain or severe pain. 90 tablet 0   No facility-administered medications prior to visit.    ROS Review of Systems  Constitutional: Negative for appetite change, fatigue and unexpected weight change.  HENT: Negative for congestion, nosebleeds, sneezing, sore throat and trouble swallowing.   Eyes: Negative for itching and visual disturbance.  Respiratory: Negative for cough.   Cardiovascular: Negative for chest pain, palpitations and leg swelling.  Gastrointestinal: Negative for nausea, diarrhea, blood in stool and abdominal distention.  Genitourinary: Negative for frequency and hematuria.  Musculoskeletal: Positive for arthralgias. Negative for back pain, joint swelling, gait problem and neck pain.  Skin: Negative for rash.  Neurological: Negative for dizziness, tremors, speech difficulty and weakness.  Psychiatric/Behavioral: Positive for sleep disturbance. Negative for suicidal ideas, dysphoric mood and agitation. The patient is nervous/anxious.     Objective:  BP 122/80 mmHg  Pulse 69  Wt 211 lb (95.709 kg)  SpO2 94%  BP Readings from Last 3 Encounters:  07/06/15 122/80  04/07/15 136/88  03/04/15 138/84    Wt Readings from Last 3 Encounters:  07/06/15 211 lb (95.709  kg)  04/07/15 215 lb (97.523 kg)  03/04/15 215 lb (97.523 kg)    Physical Exam  Constitutional: He is oriented to person, place, and time. He appears well-developed. No distress.  NAD  HENT:  Mouth/Throat: Oropharynx is clear and moist.  Eyes: Conjunctivae are normal. Pupils are equal, round, and reactive to light.  Neck: Normal range of motion. No JVD present. No thyromegaly present.  Cardiovascular: Normal rate, regular rhythm, normal heart sounds and intact distal pulses.  Exam reveals no gallop  and no friction rub.   No murmur heard. Pulmonary/Chest: Effort normal and breath sounds normal. No respiratory distress. He has no wheezes. He has no rales. He exhibits no tenderness.  Abdominal: Soft. Bowel sounds are normal. He exhibits no distension and no mass. There is no tenderness. There is no rebound and no guarding.  Musculoskeletal: Normal range of motion. He exhibits tenderness. He exhibits no edema.  Lymphadenopathy:    He has no cervical adenopathy.  Neurological: He is alert and oriented to person, place, and time. He has normal reflexes. No cranial nerve deficit. He exhibits normal muscle tone. He displays a negative Romberg sign. Coordination and gait normal.  Skin: Skin is warm and dry. No rash noted.  Psychiatric: He has a normal mood and affect. His behavior is normal. Judgment and thought content normal.  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   Risks including unsuccessful procedure , bleeding, infection, bruising, skin atrophy, "steroid flare-up" and others were explained to the patient in detail as well as the benefits. Informed consent was obtained and signed.   Tthe patient was placed in a comfortable  decubitus position. The point of maximal tenderness was identified. Skin was prepped with Betadine and alcohol. Then, a 5 cc syringe with a 1.5 inch long 25-gauge needle was used for a heel injection.. The needle was advanced and I injected the heel with 2 mL of 2% lidocaine and 40 mg of Depo-Medrol .  Band-Aid was applied.   Tolerated well. Complications: None. Some pain relief following the procedure.   Postprocedure instructions :    A Band-Aid should be left on for 12 hours. Injection therapy is not a cure itself. It is used in conjunction with other modalities. You can use nonsteroidal anti-inflammatories like ibuprofen , hot and cold compresses. Rest is recommended  in the next 24 hours. You need to report immediately  if fever, chills or any signs of infection develop.    Lab Results  Component Value Date   WBC 4.7 11/28/2014   HGB 17.3* 11/28/2014   HCT 51.2 11/28/2014   PLT 223.0 11/28/2014   GLUCOSE 100* 11/28/2014   CHOL 222* 11/28/2014   TRIG 88.0 11/28/2014   HDL 49.90 11/28/2014   LDLDIRECT 77.5 12/26/2008   LDLCALC 155* 11/28/2014   ALT 28 11/28/2014   AST 17 11/28/2014   NA 138 11/28/2014   K 4.7 11/28/2014   CL 102 11/28/2014   CREATININE 1.09 11/28/2014   BUN 32* 11/28/2014   CO2 30 11/28/2014   TSH 1.95 11/28/2014   PSA 2.17 01/25/2010   HGBA1C 6.0 07/03/2014    Dg Chest 2 View  11/28/2014  CLINICAL DATA:  Chest pain shortness of breath, shortness of breath on exertion for 2 months, nonsmoker EXAM: CHEST  2 VIEW COMPARISON:  None. FINDINGS: Heart size and vascular pattern normal.  Lungs clear except for mild scarring or atelectasis left lung base. No evidence of infiltrate or effusion. IMPRESSION: Mild left base scarring or atelectasis.  Otherwise negative. Electronically Signed   By: Skipper Cliche M.D.   On: 11/28/2014 15:36    Assessment & Plan:   Diagnoses and all orders for this visit:  Essential hypertension  Chronic maxillary sinusitis -     CT Maxillofacial WO CM; Future -     Ambulatory referral to Pulmonology  OSA on CPAP -     Ambulatory referral to Pulmonology  Need for influenza vaccination -     Flu Vaccine QUAD 36+ mos IM  Other orders -     HYDROcodone-acetaminophen (NORCO/VICODIN) 5-325 MG tablet; Take 1 tablet by mouth 2 (two) times daily as needed for moderate pain or severe pain. -     Azelastine-Fluticasone (DYMISTA) 137-50 MCG/ACT SUSP; Place 2 sprays into both nostrils at bedtime. -     clonazePAM (KLONOPIN) 1 MG tablet; Take 0.5 tablets (0.5 mg total) by mouth 2 (two) times daily as needed. -     aspirin (BAYER ASPIRIN) 325 MG tablet; Take 1 tablet (325 mg total) by mouth daily. -      amoxicillin-clavulanate (AUGMENTIN) 875-125 MG tablet; Take 1 tablet by mouth 2 (two) times daily. -     methylPREDNISolone acetate (DEPO-MEDROL) injection 40 mg; 1 mL (40 mg total) once.  I have discontinued Mr. Coppin aspirin and cefUROXime. I have also changed his HYDROcodone-acetaminophen. Additionally, I am having him start on aspirin and amoxicillin-clavulanate. Lastly, I am having him maintain his Testosterone, triamcinolone ointment, fluticasone, meloxicam, tadalafil, metoprolol succinate, pantoprazole, ciclopirox, cyclobenzaprine, celecoxib, lidocaine, benzonatate, VIIBRYD, methylphenidate, methylphenidate, Azelastine-Fluticasone, and clonazePAM.  Meds ordered this encounter  Medications  . VIIBRYD 20 MG TABS    Sig: Take 1 tablet by mouth daily.  . methylphenidate 36 MG PO CR tablet    Sig: Take 1 tablet by mouth every morning.  . methylphenidate (RITALIN) 10 MG tablet    Sig: Take 1 tablet by mouth daily as needed.  Marland Kitchen HYDROcodone-acetaminophen (NORCO/VICODIN) 5-325 MG tablet    Sig: Take 1 tablet by mouth 2 (two) times daily as needed for moderate pain or severe pain.    Dispense:  90 tablet    Refill:  0  . Azelastine-Fluticasone (DYMISTA) 137-50 MCG/ACT SUSP    Sig: Place 2 sprays into both nostrils at bedtime.    Dispense:  3 Bottle    Refill:  1    Order Specific Question:  Lot Number?    Answer:  [id number]    Order Specific Question:  Expiration Date?    Answer:  09/15/2016    Order Specific Question:  Manufacturer?    Answer:  Other Manufacturer [[id number]]     Comments:  [employer]    Order Specific Question:  Quantity    Answer:  1     Comments:  bottle(6g)  . clonazePAM (KLONOPIN) 1 MG tablet    Sig: Take 0.5 tablets (0.5 mg total) by mouth 2 (two) times daily as needed.    Dispense:  180 tablet    Refill:  1  . aspirin (BAYER ASPIRIN) 325 MG tablet    Sig: Take 1 tablet (325 mg total) by mouth daily.    Dispense:  100 tablet    Refill:  3  . amoxicillin-clavulanate  (AUGMENTIN) 875-125 MG tablet    Sig: Take 1 tablet by mouth 2 (two) times daily.    Dispense:  28 tablet  Refill:  0     Follow-up: Return in about 3 months (around 10/06/2015) for a follow-up visit.  Walker Kehr, MD

## 2015-07-06 NOTE — Patient Instructions (Signed)
Postprocedure instructions :    A Band-Aid should be left on for 12 hours. Injection therapy is not a cure itself. It is used in conjunction with other modalities. You can use nonsteroidal anti-inflammatories like ibuprofen , hot and cold compresses. Rest is recommended in the next 24 hours. You need to report immediately  if fever, chills or any signs of infection develop. 

## 2015-07-06 NOTE — Assessment & Plan Note (Signed)
11/15 Toprol po

## 2015-07-06 NOTE — Progress Notes (Signed)
Pre visit review using our clinic review tool, if applicable. No additional management support is needed unless otherwise documented below in the visit note. 

## 2015-07-20 ENCOUNTER — Ambulatory Visit
Admission: RE | Admit: 2015-07-20 | Discharge: 2015-07-20 | Disposition: A | Discharge status: Home / Self Care | Source: Ambulatory Visit | Attending: Internal Medicine | Admitting: Internal Medicine

## 2015-07-20 ENCOUNTER — Other Ambulatory Visit: Payer: Self-pay | Admitting: Internal Medicine

## 2015-07-20 DIAGNOSIS — J320 Chronic maxillary sinusitis: Secondary | ICD-10-CM

## 2015-07-20 MED ORDER — MONTELUKAST SODIUM 10 MG PO TABS: 10.0000 mg | ORAL_TABLET | Freq: Every day | ORAL | Status: DC

## 2015-07-21 ENCOUNTER — Telehealth: Payer: Self-pay | Admitting: Internal Medicine

## 2015-07-21 DIAGNOSIS — G4733 Obstructive sleep apnea (adult) (pediatric): Secondary | ICD-10-CM

## 2015-07-21 NOTE — Telephone Encounter (Signed)
Okay with me 

## 2015-07-21 NOTE — Telephone Encounter (Signed)
Dr. Halford Chessman please advise if you're ok with taking on this sleep patient.  Thanks!

## 2015-07-21 NOTE — Telephone Encounter (Signed)
Spoke with pt. States that he wants to switch to another sleep MD in the office. He is currently seeing CY.  CY - are you okay with pt switching? Thanks.

## 2015-07-21 NOTE — Telephone Encounter (Signed)
Spoke with patient-- aware that I will speak with Dr Halford Chessman to see when he wants to follow up based on last OV with CY.  You are booked up through March and your April schedule is not open yet. Please advise. Thanks.

## 2015-07-21 NOTE — Telephone Encounter (Signed)
Fine with me

## 2015-07-23 NOTE — Telephone Encounter (Signed)
VS please advise. Thanks! 

## 2015-07-23 NOTE — Telephone Encounter (Signed)
lmtcb x1 for pt. 

## 2015-07-23 NOTE — Telephone Encounter (Signed)
He can follow up in June 2017.

## 2015-07-24 NOTE — Telephone Encounter (Signed)
Left message for pt to call back  °

## 2015-07-27 NOTE — Telephone Encounter (Signed)
LMTCB x2  

## 2015-07-28 NOTE — Telephone Encounter (Signed)
Left detailed message advising patient that he can call office and schedule appointment to see Dr. Halford Chessman in June 2017. Awaiting call back from patient.

## 2015-07-29 NOTE — Telephone Encounter (Signed)
LMTCB x2 for pt 

## 2015-07-30 NOTE — Telephone Encounter (Signed)
Pt aware of switch to VS Pt states that he is okay with following up for his routine check up on CPAP in 2017 but feels he needs to be seen regarding a recent CT sinus and possible relation to CPAP. Pt has CT Sinus 07/20/15 d/t recurrent sinus infections and difficulty hearing. 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 Pt states that Dr Alain Marion feels that it could be related to his CPAP. Pt reports having red eyes every morning when he wakes up. Pt reports being on 3 different courses of abx since Sep 2016 and recently completed a round of Augmentin. Pt states that he could be here today around 4pm if you feel he needs to be seen, otherwise any rec's would be great. Please advise Dr Halford Chessman. Thanks.

## 2015-07-30 NOTE — Telephone Encounter (Signed)
Spoke with pt. He is aware of VS's recommendation. Order has been placed for mask fit and download. Nothing further was needed at this time.

## 2015-07-30 NOTE — Telephone Encounter (Signed)
Please have his DME assess his mask fit and have download sent from his machine.  Will call him back once download reviewed, and then determine if he needs adjustment to CPAP settings or change in CPAP mask.

## 2015-09-02 ENCOUNTER — Telehealth: Payer: Self-pay | Admitting: Internal Medicine

## 2015-09-02 DIAGNOSIS — I10 Essential (primary) hypertension: Secondary | ICD-10-CM

## 2015-09-02 NOTE — Telephone Encounter (Signed)
OK CBC, BMET Thx 

## 2015-09-02 NOTE — Telephone Encounter (Signed)
Patient has schedule appt for the 24th to see Dr. Camila Li for High BP.  Patient would like to know if he wants to have hemoglobin checked prior.

## 2015-09-03 ENCOUNTER — Other Ambulatory Visit (INDEPENDENT_AMBULATORY_CARE_PROVIDER_SITE_OTHER)

## 2015-09-03 DIAGNOSIS — I10 Essential (primary) hypertension: Secondary | ICD-10-CM | POA: Diagnosis not present

## 2015-09-03 LAB — CBC WITH DIFFERENTIAL/PLATELET
BASOS PCT: 0.6 % (ref 0.0–3.0)
Basophils Absolute: 0 10*3/uL (ref 0.0–0.1)
EOS PCT: 3.8 % (ref 0.0–5.0)
Eosinophils Absolute: 0.2 10*3/uL (ref 0.0–0.7)
HEMATOCRIT: 52.2 % — AB (ref 39.0–52.0)
Hemoglobin: 17 g/dL (ref 13.0–17.0)
LYMPHS PCT: 25.2 % (ref 12.0–46.0)
Lymphs Abs: 1.6 10*3/uL (ref 0.7–4.0)
MCHC: 32.6 g/dL (ref 30.0–36.0)
MCV: 93.4 fl (ref 78.0–100.0)
MONOS PCT: 8.2 % (ref 3.0–12.0)
Monocytes Absolute: 0.5 10*3/uL (ref 0.1–1.0)
Neutro Abs: 3.9 10*3/uL (ref 1.4–7.7)
Neutrophils Relative %: 62.2 % (ref 43.0–77.0)
PLATELETS: 233 10*3/uL (ref 150.0–400.0)
RBC: 5.59 Mil/uL (ref 4.22–5.81)
RDW: 13.9 % (ref 11.5–15.5)
WBC: 6.3 10*3/uL (ref 4.0–10.5)

## 2015-09-03 LAB — BASIC METABOLIC PANEL
BUN: 26 mg/dL — AB (ref 6–23)
CHLORIDE: 100 meq/L (ref 96–112)
CO2: 33 mEq/L — ABNORMAL HIGH (ref 19–32)
CREATININE: 1.12 mg/dL (ref 0.40–1.50)
Calcium: 9.9 mg/dL (ref 8.4–10.5)
GFR: 70.65 mL/min (ref >60.00)
GLUCOSE: 100 mg/dL — AB (ref 70–99)
Potassium: 4.5 mEq/L (ref 3.5–5.1)
Sodium: 137 mEq/L (ref 135–145)

## 2015-09-03 NOTE — Telephone Encounter (Signed)
Notified pt md ok labs have been place in epic...[name] [name]

## 2015-09-08 ENCOUNTER — Ambulatory Visit (INDEPENDENT_AMBULATORY_CARE_PROVIDER_SITE_OTHER): Admitting: Internal Medicine

## 2015-09-08 ENCOUNTER — Encounter: Payer: Self-pay | Admitting: Internal Medicine

## 2015-09-08 ENCOUNTER — Other Ambulatory Visit (INDEPENDENT_AMBULATORY_CARE_PROVIDER_SITE_OTHER)

## 2015-09-08 VITALS — BP 120/80 | HR 55 | Wt 212.0 lb

## 2015-09-08 DIAGNOSIS — D751 Secondary polycythemia: Secondary | ICD-10-CM | POA: Diagnosis not present

## 2015-09-08 DIAGNOSIS — R7989 Other specified abnormal findings of blood chemistry: Secondary | ICD-10-CM

## 2015-09-08 DIAGNOSIS — E291 Testicular hypofunction: Secondary | ICD-10-CM | POA: Diagnosis not present

## 2015-09-08 DIAGNOSIS — I10 Essential (primary) hypertension: Secondary | ICD-10-CM

## 2015-09-08 DIAGNOSIS — F4323 Adjustment disorder with mixed anxiety and depressed mood: Secondary | ICD-10-CM

## 2015-09-08 LAB — CBC WITH DIFFERENTIAL/PLATELET
BASOS ABS: 0 10*3/uL (ref 0.0–0.1)
BASOS PCT: 0.4 % (ref 0.0–3.0)
EOS ABS: 0.3 10*3/uL (ref 0.0–0.7)
Eosinophils Relative: 4.8 % (ref 0.0–5.0)
HCT: 50.8 % (ref 39.0–52.0)
HEMOGLOBIN: 16.8 g/dL (ref 13.0–17.0)
LYMPHS PCT: 25.2 % (ref 12.0–46.0)
Lymphs Abs: 1.5 10*3/uL (ref 0.7–4.0)
MCHC: 33.1 g/dL (ref 30.0–36.0)
MCV: 92.9 fl (ref 78.0–100.0)
MONO ABS: 0.6 10*3/uL (ref 0.1–1.0)
Monocytes Relative: 10.4 % (ref 3.0–12.0)
Neutro Abs: 3.5 10*3/uL (ref 1.4–7.7)
Neutrophils Relative %: 59.2 % (ref 43.0–77.0)
PLATELETS: 250 10*3/uL (ref 150.0–400.0)
RBC: 5.47 Mil/uL (ref 4.22–5.81)
RDW: 13.8 % (ref 11.5–15.5)
WBC: 5.9 10*3/uL (ref 4.0–10.5)

## 2015-09-08 LAB — TESTOSTERONE: Testosterone: 307.03 ng/dL (ref 300.00–890.00)

## 2015-09-08 NOTE — Assessment & Plan Note (Signed)
Testosterone Rx Phlebotomy - goal Hct<45%

## 2015-09-08 NOTE — Progress Notes (Signed)
Subjective:  Patient ID: [name] [name], male    DOB: [birth year]  Age: 60 y.o. MRN: [id number]  CC: No chief complaint on file.   HPI [name] [name] presents for stress, elevated BP, HTN f/u  BP is elevated   Outpatient Prescriptions Prior to Visit  Medication Sig Dispense Refill  . aspirin (BAYER ASPIRIN) 325 MG tablet Take 1 tablet (325 mg total) by mouth daily. 100 tablet 3  . Azelastine-Fluticasone (DYMISTA) 137-50 MCG/ACT SUSP Place 2 sprays into both nostrils at bedtime. 3 Bottle 1  . ciclopirox (PENLAC) 8 % solution Apply topically at bedtime. Apply over nail and surrounding skin. Apply daily over previous coat. After seven (7) days, may remove with alcohol and continue cycle. 6.6 mL 0  . clonazePAM (KLONOPIN) 1 MG tablet Take 0.5 tablets (0.5 mg total) by mouth 2 (two) times daily as needed. 180 tablet 1  . cyclobenzaprine (FLEXERIL) 10 MG tablet Take 1 tablet (10 mg total) by mouth 3 (three) times daily as needed for muscle spasms. 90 tablet 1  . HYDROcodone-acetaminophen (NORCO/VICODIN) 5-325 MG tablet Take 1 tablet by mouth 2 (two) times daily as needed for moderate pain or severe pain. 90 tablet 0  . lidocaine (XYLOCAINE) 5 % ointment     . meloxicam (MOBIC) 15 MG tablet Take 15 mg by mouth daily.    . methylphenidate (RITALIN) 10 MG tablet Take 1 tablet by mouth daily as needed.    . methylphenidate 36 MG PO CR tablet Take 1 tablet by mouth every morning.    . metoprolol succinate (TOPROL-XL) 25 MG 24 hr tablet Take 1 tablet (25 mg total) by mouth daily. 90 tablet 3  . pantoprazole (PROTONIX) 40 MG tablet Take 1 tablet (40 mg total) by mouth daily before breakfast. 90 tablet 3  . tadalafil (CIALIS) 5 MG tablet Take 1 tablet (5 mg total) by mouth daily as needed for erectile dysfunction. 30 tablet 11  . Testosterone (ANDROGEL) 20.25 MG/1.25GM (1.62%) GEL Place onto the skin.    Marland Kitchen triamcinolone ointment (KENALOG) 0.5 % Apply 1 application topically 2 (two) times daily. 30 g 0  .  VIIBRYD 20 MG TABS Take 1 tablet by mouth daily.    . benzonatate (TESSALON) 200 MG capsule Take 1 capsule (200 mg total) by mouth 2 (two) times daily as needed for cough. (Patient not taking: Reported on 09/08/2015) 60 capsule 0  . celecoxib (CELEBREX) 200 MG capsule Reported on 09/08/2015  0  . montelukast (SINGULAIR) 10 MG tablet Take 1 tablet (10 mg total) by mouth daily. (Patient not taking: Reported on 09/08/2015) 30 tablet 11  . amoxicillin-clavulanate (AUGMENTIN) 875-125 MG tablet Take 1 tablet by mouth 2 (two) times daily. (Patient not taking: Reported on 09/08/2015) 28 tablet 0   No facility-administered medications prior to visit.    ROS Review of Systems  Constitutional: Negative for appetite change, fatigue and unexpected weight change.  HENT: Negative for congestion, nosebleeds, sneezing, sore throat and trouble swallowing.   Eyes: Negative for itching and visual disturbance.  Respiratory: Negative for cough.   Cardiovascular: Negative for chest pain, palpitations and leg swelling.  Gastrointestinal: Negative for nausea, diarrhea, blood in stool and abdominal distention.  Genitourinary: Negative for frequency and hematuria.  Musculoskeletal: Negative for back pain, joint swelling, gait problem and neck pain.  Skin: Negative for rash.  Neurological: Negative for dizziness, tremors, speech difficulty and weakness.  Psychiatric/Behavioral: Positive for dysphoric mood. Negative for suicidal ideas, sleep disturbance and agitation. The  patient is nervous/anxious.     Objective:  BP 120/80 mmHg  Pulse 55  Wt 212 lb (96.163 kg)  SpO2 93%  BP Readings from Last 3 Encounters:  09/08/15 120/80  07/06/15 122/80  04/07/15 136/88    Wt Readings from Last 3 Encounters:  09/08/15 212 lb (96.163 kg)  07/06/15 211 lb (95.709 kg)  04/07/15 215 lb (97.523 kg)    Physical Exam  Constitutional: He is oriented to person, place, and time. He appears well-developed. No distress.  NAD    HENT:  Mouth/Throat: Oropharynx is clear and moist.  Eyes: Conjunctivae are normal. Pupils are equal, round, and reactive to light.  Neck: Normal range of motion. No JVD present. No thyromegaly present.  Cardiovascular: Normal rate, regular rhythm, normal heart sounds and intact distal pulses.  Exam reveals no gallop and no friction rub.   No murmur heard. Pulmonary/Chest: Effort normal and breath sounds normal. No respiratory distress. He has no wheezes. He has no rales. He exhibits no tenderness.  Abdominal: Soft. Bowel sounds are normal. He exhibits no distension and no mass. There is no tenderness. There is no rebound and no guarding.  Musculoskeletal: Normal range of motion. He exhibits no edema or tenderness.  Lymphadenopathy:    He has no cervical adenopathy.  Neurological: He is alert and oriented to person, place, and time. He has normal reflexes. No cranial nerve deficit. He exhibits normal muscle tone. He displays a negative Romberg sign. Coordination and gait normal.  Skin: Skin is warm and dry. No rash noted.  Psychiatric: He has a normal mood and affect. His behavior is normal. Judgment and thought content normal.    Lab Results  Component Value Date   WBC 6.3 09/03/2015   HGB 17.0 09/03/2015   HCT 52.2* 09/03/2015   PLT 233.0 09/03/2015   GLUCOSE 100* 09/03/2015   CHOL 222* 11/28/2014   TRIG 88.0 11/28/2014   HDL 49.90 11/28/2014   LDLDIRECT 77.5 12/26/2008   LDLCALC 155* 11/28/2014   ALT 28 11/28/2014   AST 17 11/28/2014   NA 137 09/03/2015   K 4.5 09/03/2015   CL 100 09/03/2015   CREATININE 1.12 09/03/2015   BUN 26* 09/03/2015   CO2 33* 09/03/2015   TSH 1.95 11/28/2014   PSA 2.17 01/25/2010   HGBA1C 6.0 07/03/2014    Ct Maxillofacial Wo Cm  07/20/2015  CLINICAL DATA:  Chronic maxillary sinusitis EXAM: CT MAXILLOFACIAL WITHOUT CONTRAST TECHNIQUE: Multidetector CT imaging of the maxillofacial structures was performed. Multiplanar CT image reconstructions  were also generated. A small metallic BB was placed on the right temple in order to reliably differentiate right from left. COMPARISON:  None. FINDINGS: Mild mucosal edema in the maxillary sinus bilaterally without air-fluid level. Narrowing of the ostiomeatal complex bilaterally due to mucosal edema. Mucosal edema in the frontal, sphenoid, and ethmoid sinuses. No air-fluid level Nasal septum slightly deviated to the left. Negative for fracture. No acute bony abnormality. Normal orbit and surrounding soft tissues. Limited intracranial imaging negative. IMPRESSION: Mild mucosal edema throughout the paranasal sinuses without air-fluid level. Electronically Signed   By: Franchot Gallo M.D.   On: 07/20/2015 17:01    Assessment & Plan:   There are no diagnoses linked to this encounter. I have discontinued Mr. Shonk amoxicillin-clavulanate. I am also having him maintain his Testosterone, triamcinolone ointment, meloxicam, tadalafil, metoprolol succinate, pantoprazole, ciclopirox, cyclobenzaprine, celecoxib, lidocaine, benzonatate, VIIBRYD, methylphenidate, methylphenidate, HYDROcodone-acetaminophen, Azelastine-Fluticasone, clonazePAM, aspirin, and montelukast.  No orders of the defined types were  placed in this encounter.     Follow-up: No Follow-up on file.  Walker Kehr, MD

## 2015-09-08 NOTE — Progress Notes (Signed)
Pre visit review using our clinic review tool, if applicable. No additional management support is needed unless otherwise documented below in the visit note. 

## 2015-09-08 NOTE — Assessment & Plan Note (Signed)
On Toprol Phlebotomy - goal Hct<45%

## 2015-09-10 NOTE — Assessment & Plan Note (Signed)
Clonazepam prn, Vybrid  Potential benefits of a long term benzodiazepines  use as well as potential risks  and complications were explained to the patient and were aknowledged.

## 2015-10-28 ENCOUNTER — Other Ambulatory Visit: Payer: Self-pay | Admitting: Internal Medicine

## 2015-11-03 MED ORDER — CYCLOBENZAPRINE HCL 10 MG PO TABS: 10.0000 mg | ORAL_TABLET | Freq: Three times a day (TID) | ORAL | Status: DC | PRN

## 2015-11-03 NOTE — Addendum Note (Signed)
Addended by: Cresenciano Lick on: 11/03/2015 07:51 AM   Modules accepted: Orders

## 2015-11-13 ENCOUNTER — Telehealth: Payer: Self-pay | Admitting: Internal Medicine
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
## 2015-11-19 ENCOUNTER — Other Ambulatory Visit: Payer: Self-pay | Admitting: Geriatric Medicine

## 2015-11-19 ENCOUNTER — Other Ambulatory Visit

## 2015-11-19 DIAGNOSIS — R7989 Other specified abnormal findings of blood chemistry: Secondary | ICD-10-CM

## 2015-11-19 DIAGNOSIS — D751 Secondary polycythemia: Secondary | ICD-10-CM

## 2016-01-14 ENCOUNTER — Other Ambulatory Visit: Payer: Self-pay | Admitting: Internal Medicine

## 2016-01-28 ENCOUNTER — Other Ambulatory Visit: Payer: Self-pay | Admitting: Internal Medicine

## 2016-03-16 ENCOUNTER — Other Ambulatory Visit: Payer: Self-pay | Admitting: Internal Medicine

## 2016-03-17 ENCOUNTER — Other Ambulatory Visit: Payer: Self-pay | Admitting: Internal Medicine

## 2016-03-17 NOTE — Telephone Encounter (Signed)
Pt was wondering if Dr. Camila Li can give him maybe 25 days of HYDROcodone-acetaminophen (NORCO/VICODIN) 5-325 MG tablet for sciatic nerve (he has 5 pills left). Pt has an appt 03/30/16 to see Plot. Please advise.

## 2016-03-17 NOTE — Telephone Encounter (Signed)
Ok Thx 

## 2016-03-18 MED ORDER — HYDROCODONE-ACETAMINOPHEN 5-325 MG PO TABS: 1.0000 | ORAL_TABLET | Freq: Two times a day (BID) | ORAL | 0 refills | Status: DC | PRN

## 2016-03-18 NOTE — Telephone Encounter (Signed)
Patient needs to pick script up today.  Please call back as soon as ready.

## 2016-03-18 NOTE — Telephone Encounter (Signed)
Rx printed/signed/upfront for p/u.  

## 2016-03-30 ENCOUNTER — Encounter: Payer: Self-pay | Admitting: Internal Medicine

## 2016-03-30 ENCOUNTER — Ambulatory Visit (INDEPENDENT_AMBULATORY_CARE_PROVIDER_SITE_OTHER): Admitting: Internal Medicine

## 2016-03-30 ENCOUNTER — Other Ambulatory Visit (INDEPENDENT_AMBULATORY_CARE_PROVIDER_SITE_OTHER)

## 2016-03-30 VITALS — BP 140/90 | HR 69 | Wt 206.0 lb

## 2016-03-30 DIAGNOSIS — Z0000 Encounter for general adult medical examination without abnormal findings: Secondary | ICD-10-CM

## 2016-03-30 DIAGNOSIS — E291 Testicular hypofunction: Secondary | ICD-10-CM

## 2016-03-30 DIAGNOSIS — M159 Polyosteoarthritis, unspecified: Secondary | ICD-10-CM

## 2016-03-30 DIAGNOSIS — M150 Primary generalized (osteo)arthritis: Secondary | ICD-10-CM | POA: Diagnosis not present

## 2016-03-30 DIAGNOSIS — I10 Essential (primary) hypertension: Secondary | ICD-10-CM | POA: Diagnosis not present

## 2016-03-30 DIAGNOSIS — R7989 Other specified abnormal findings of blood chemistry: Secondary | ICD-10-CM

## 2016-03-30 DIAGNOSIS — F319 Bipolar disorder, unspecified: Secondary | ICD-10-CM

## 2016-03-30 DIAGNOSIS — D751 Secondary polycythemia: Secondary | ICD-10-CM

## 2016-03-30 LAB — CBC WITH DIFFERENTIAL/PLATELET
Basophils Absolute: 0 10*3/uL (ref 0.0–0.1)
Basophils Relative: 0.4 % (ref 0.0–3.0)
Eosinophils Absolute: 0.3 10*3/uL (ref 0.0–0.7)
Eosinophils Relative: 6.5 % — ABNORMAL HIGH (ref 0.0–5.0)
HCT: 48.1 % (ref 39.0–52.0)
HEMOGLOBIN: 16.3 g/dL (ref 13.0–17.0)
LYMPHS ABS: 1.2 10*3/uL (ref 0.7–4.0)
LYMPHS PCT: 27.3 % (ref 12.0–46.0)
MCHC: 33.9 g/dL (ref 30.0–36.0)
MCV: 89.6 fl (ref 78.0–100.0)
MONOS PCT: 13.4 % — AB (ref 3.0–12.0)
Monocytes Absolute: 0.6 10*3/uL (ref 0.1–1.0)
NEUTROS PCT: 52.4 % (ref 43.0–77.0)
Neutro Abs: 2.3 10*3/uL (ref 1.4–7.7)
Platelets: 259 10*3/uL (ref 150.0–400.0)
RBC: 5.36 Mil/uL (ref 4.22–5.81)
RDW: 15.1 % (ref 11.5–15.5)
WBC: 4.4 10*3/uL (ref 4.0–10.5)

## 2016-03-30 MED ORDER — TRIAMCINOLONE ACETONIDE 0.5 % EX OINT: 1.0000 "application " | TOPICAL_OINTMENT | Freq: Two times a day (BID) | CUTANEOUS | 3 refills | Status: DC

## 2016-03-30 MED ORDER — METOPROLOL SUCCINATE ER 25 MG PO TB24: 25.0000 mg | ORAL_TABLET | Freq: Every day | ORAL | 3 refills | Status: DC

## 2016-03-30 MED ORDER — CICLOPIROX 8 % EX SOLN: Freq: Every day | CUTANEOUS | 1 refills | Status: DC

## 2016-03-30 MED ORDER — CYCLOBENZAPRINE HCL 10 MG PO TABS: 10.0000 mg | ORAL_TABLET | Freq: Three times a day (TID) | ORAL | 1 refills | Status: DC | PRN

## 2016-03-30 MED ORDER — PANTOPRAZOLE SODIUM 40 MG PO TBEC: 40.0000 mg | DELAYED_RELEASE_TABLET | Freq: Every day | ORAL | 3 refills | Status: DC

## 2016-03-30 MED ORDER — HYDROCODONE-ACETAMINOPHEN 5-325 MG PO TABS: 1.0000 | ORAL_TABLET | Freq: Two times a day (BID) | ORAL | 0 refills | Status: DC | PRN

## 2016-03-30 MED ORDER — TADALAFIL 5 MG PO TABS: 5.0000 mg | ORAL_TABLET | Freq: Every day | ORAL | 3 refills | Status: DC | PRN

## 2016-03-30 MED ORDER — DYMISTA 137-50 MCG/ACT NA SUSP: 2.0000 | Freq: Every evening | NASAL | 3 refills | Status: DC | PRN

## 2016-03-30 NOTE — Assessment & Plan Note (Signed)
Dr Gaynelle Arabian On Androgel Phlebotomy - goal Hct<45%

## 2016-03-30 NOTE — Progress Notes (Signed)
Subjective:  Patient ID: [name] [name], male    DOB: [birth year]  Age: 60 y.o. MRN: [id number]  CC: No chief complaint on file.   HPI [name] [name] presents for sciatica pain, depression, ADD. He is on some new meds. C/o dry ejaculation  Outpatient Medications Prior to Visit  Medication Sig Dispense Refill  . aspirin (BAYER ASPIRIN) 325 MG tablet Take 1 tablet (325 mg total) by mouth daily. 100 tablet 3  . celecoxib (CELEBREX) 200 MG capsule Reported on 09/08/2015  0  . ciclopirox (PENLAC) 8 % solution Apply topically at bedtime. Apply over nail and surrounding skin. Apply daily over previous coat. After seven (7) days, may remove with alcohol and continue cycle. 6.6 mL 0  . cyclobenzaprine (FLEXERIL) 10 MG tablet Take 1 tablet (10 mg total) by mouth 3 (three) times daily as needed for muscle spasms. 90 tablet 1  . DYMISTA 137-50 MCG/ACT SUSP USE 2 SPRAYS IN EACH NOSTRIL AT BEDTIME 69 g 0  . HYDROcodone-acetaminophen (NORCO/VICODIN) 5-325 MG tablet Take 1 tablet by mouth 2 (two) times daily as needed for moderate pain or severe pain. 30 tablet 0  . meloxicam (MOBIC) 15 MG tablet Take 15 mg by mouth daily.    . montelukast (SINGULAIR) 10 MG tablet Take 1 tablet (10 mg total) by mouth daily. 30 tablet 11  . pantoprazole (PROTONIX) 40 MG tablet TAKE 1 TABLET DAILY BEFORE BREAKFAST 90 tablet 2  . tadalafil (CIALIS) 5 MG tablet Take 1 tablet (5 mg total) by mouth daily as needed for erectile dysfunction. 30 tablet 11  . Testosterone (ANDROGEL) 20.25 MG/1.25GM (1.62%) GEL Place onto the skin.    . TOPROL XL 25 MG 24 hr tablet TAKE 1 TABLET DAILY 90 tablet 2  . triamcinolone ointment (KENALOG) 0.5 % Apply 1 application topically 2 (two) times daily. 30 g 0  . VIIBRYD 20 MG TABS Take 1 tablet by mouth daily.    . benzonatate (TESSALON) 200 MG capsule Take 1 capsule (200 mg total) by mouth 2 (two) times daily as needed for cough. (Patient not taking: Reported on 03/30/2016) 60 capsule 0  . clonazePAM  (KLONOPIN) 1 MG tablet Take 0.5 tablets (0.5 mg total) by mouth 2 (two) times daily as needed. (Patient not taking: Reported on 03/30/2016) 180 tablet 1  . lidocaine (XYLOCAINE) 5 % ointment     . methylphenidate (RITALIN) 10 MG tablet Take 1 tablet by mouth daily as needed.    . methylphenidate 36 MG PO CR tablet Take 1 tablet by mouth every morning.     No facility-administered medications prior to visit.     ROS Review of Systems  Constitutional: Negative for appetite change, fatigue and unexpected weight change.  HENT: Negative for congestion, nosebleeds, sneezing, sore throat and trouble swallowing.   Eyes: Negative for itching and visual disturbance.  Respiratory: Negative for cough.   Cardiovascular: Negative for chest pain, palpitations and leg swelling.  Gastrointestinal: Negative for abdominal distention, blood in stool, diarrhea and nausea.  Genitourinary: Negative for frequency and hematuria.  Musculoskeletal: Positive for back pain. Negative for gait problem, joint swelling and neck pain.  Skin: Negative for rash.  Neurological: Negative for dizziness, tremors, speech difficulty and weakness.  Psychiatric/Behavioral: Positive for dysphoric mood. Negative for agitation and sleep disturbance. The patient is nervous/anxious.     Objective:  BP 140/90   Pulse 69   Wt 206 lb (93.4 kg)   SpO2 94%   BMI 30.42 kg/m  BP Readings from Last 3 Encounters:  03/30/16 140/90  09/08/15 120/80  07/06/15 122/80    Wt Readings from Last 3 Encounters:  03/30/16 206 lb (93.4 kg)  09/08/15 212 lb (96.2 kg)  07/06/15 211 lb (95.7 kg)    Physical Exam  Constitutional: He is oriented to person, place, and time. He appears well-developed. No distress.  NAD  HENT:  Mouth/Throat: Oropharynx is clear and moist.  Eyes: Conjunctivae are normal. Pupils are equal, round, and reactive to light.  Neck: Normal range of motion. No JVD present. No thyromegaly present.  Cardiovascular: Normal  rate, regular rhythm, normal heart sounds and intact distal pulses.  Exam reveals no gallop and no friction rub.   No murmur heard. Pulmonary/Chest: Effort normal and breath sounds normal. No respiratory distress. He has no wheezes. He has no rales. He exhibits no tenderness.  Abdominal: Soft. Bowel sounds are normal. He exhibits no distension and no mass. There is no tenderness. There is no rebound and no guarding.  Musculoskeletal: Normal range of motion. He exhibits tenderness. He exhibits no edema.  Lymphadenopathy:    He has no cervical adenopathy.  Neurological: He is alert and oriented to person, place, and time. He has normal reflexes. No cranial nerve deficit. He exhibits normal muscle tone. He displays a negative Romberg sign. Coordination and gait normal.  Skin: Skin is warm and dry. No rash noted.  Psychiatric: He has a normal mood and affect. His behavior is normal. Judgment and thought content normal.  LS is tender  Lab Results  Component Value Date   WBC 5.9 09/08/2015   HGB 16.8 09/08/2015   HCT 50.8 09/08/2015   PLT 250.0 09/08/2015   GLUCOSE 100 (H) 09/03/2015   CHOL 222 (H) 11/28/2014   TRIG 88.0 11/28/2014   HDL 49.90 11/28/2014   LDLDIRECT 77.5 12/26/2008   LDLCALC 155 (H) 11/28/2014   ALT 28 11/28/2014   AST 17 11/28/2014   NA 137 09/03/2015   K 4.5 09/03/2015   CL 100 09/03/2015   CREATININE 1.12 09/03/2015   BUN 26 (H) 09/03/2015   CO2 33 (H) 09/03/2015   TSH 1.95 11/28/2014   PSA 2.17 01/25/2010   HGBA1C 6.0 07/03/2014    Ct Maxillofacial Wo Cm  Result Date: 07/20/2015 CLINICAL DATA:  Chronic maxillary sinusitis EXAM: CT MAXILLOFACIAL WITHOUT CONTRAST TECHNIQUE: Multidetector CT imaging of the maxillofacial structures was performed. Multiplanar CT image reconstructions were also generated. A small metallic BB was placed on the right temple in order to reliably differentiate right from left. COMPARISON:  None. FINDINGS: Mild mucosal edema in the  maxillary sinus bilaterally without air-fluid level. Narrowing of the ostiomeatal complex bilaterally due to mucosal edema. Mucosal edema in the frontal, sphenoid, and ethmoid sinuses. No air-fluid level Nasal septum slightly deviated to the left. Negative for fracture. No acute bony abnormality. Normal orbit and surrounding soft tissues. Limited intracranial imaging negative. IMPRESSION: Mild mucosal edema throughout the paranasal sinuses without air-fluid level. Electronically Signed   By: Franchot Gallo M.D.   On: 07/20/2015 17:01    Assessment & Plan:   There are no diagnoses linked to this encounter. I am having Mr. Wagenbach maintain his Testosterone, triamcinolone ointment, meloxicam, tadalafil, ciclopirox, celecoxib, lidocaine, benzonatate, VIIBRYD, methylphenidate, methylphenidate, clonazePAM, aspirin, montelukast, cyclobenzaprine, pantoprazole, TOPROL XL, DYMISTA, HYDROcodone-acetaminophen, amphetamine-dextroamphetamine, and lisdexamfetamine.  Meds ordered this encounter  Medications  . amphetamine-dextroamphetamine (ADDERALL XR) 10 MG 24 hr capsule    Sig: Take 10 mg by mouth daily.  Marland Kitchen  lisdexamfetamine (VYVANSE) 60 MG capsule    Sig: Take 1 capsule by mouth daily.     Follow-up: No Follow-up on file.  Walker Kehr, MD

## 2016-03-30 NOTE — Assessment & Plan Note (Signed)
Toprol po 

## 2016-03-30 NOTE — Patient Instructions (Signed)
Look up "piriformis syndrome"

## 2016-03-30 NOTE — Progress Notes (Signed)
Pre visit review using our clinic review tool, if applicable. No additional management support is needed unless otherwise documented below in the visit note. 

## 2016-03-30 NOTE — Assessment & Plan Note (Signed)
Norco prn  Potential benefits of a long term opioids use as well as potential risks (i.e. addiction risk, apnea etc) and complications (i.e. Somnolence, constipation and others) were explained to the patient and were aknowledged. 

## 2016-03-30 NOTE — Assessment & Plan Note (Signed)
Viibrid Vyvance Chronic Dr Clovis Pu, Dr Cheryln Manly

## 2016-03-30 NOTE — Assessment & Plan Note (Signed)
CBC

## 2016-04-21 ENCOUNTER — Encounter: Payer: Self-pay | Admitting: Internal Medicine

## 2016-04-21 ENCOUNTER — Other Ambulatory Visit: Payer: Self-pay | Admitting: Internal Medicine

## 2016-04-21 DIAGNOSIS — M545 Low back pain: Secondary | ICD-10-CM

## 2016-04-25 IMAGING — CR DG CHEST 2V
2 series · 2 of 2 positions shown · non-contrast
Comparison: None.

CLINICAL DATA: Chest pain shortness of breath, shortness of breath
on exertion for 2 months, nonsmoker

EXAM:
CHEST  2 VIEW

[view not recorded (1 of 2)]
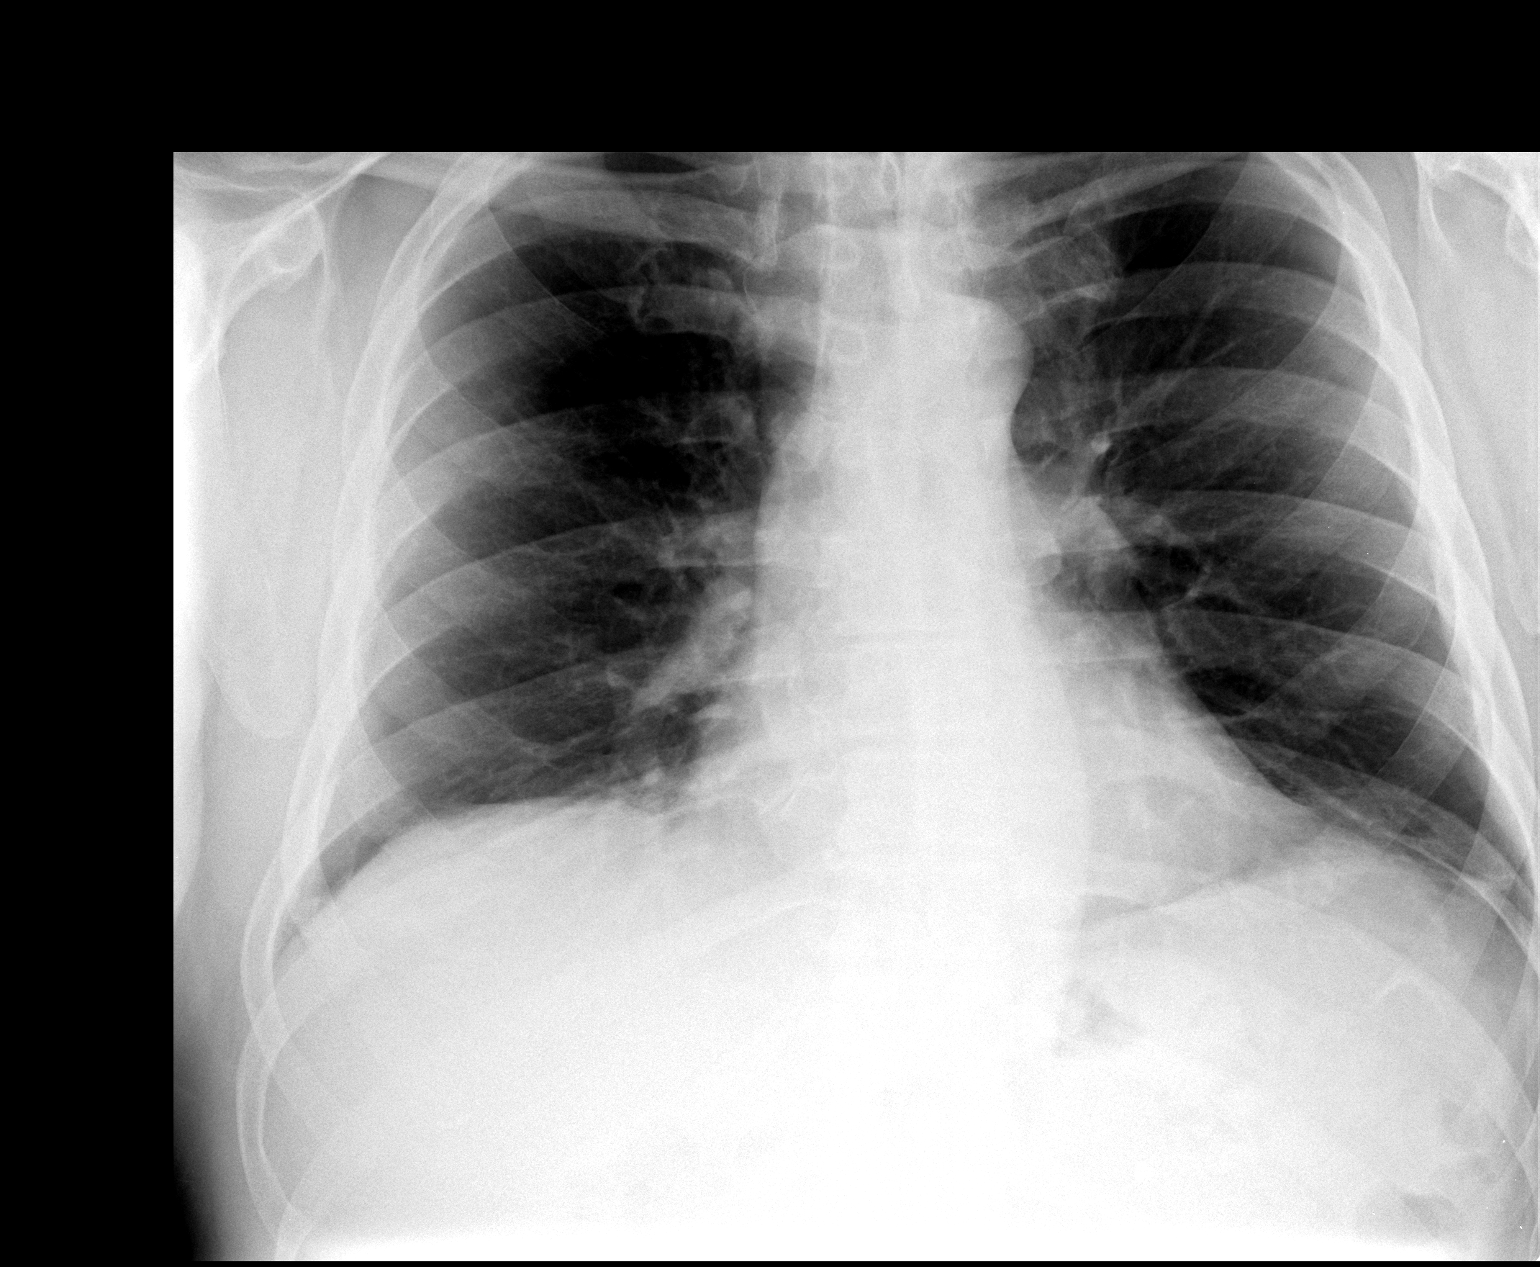

[view not recorded (2 of 2)]
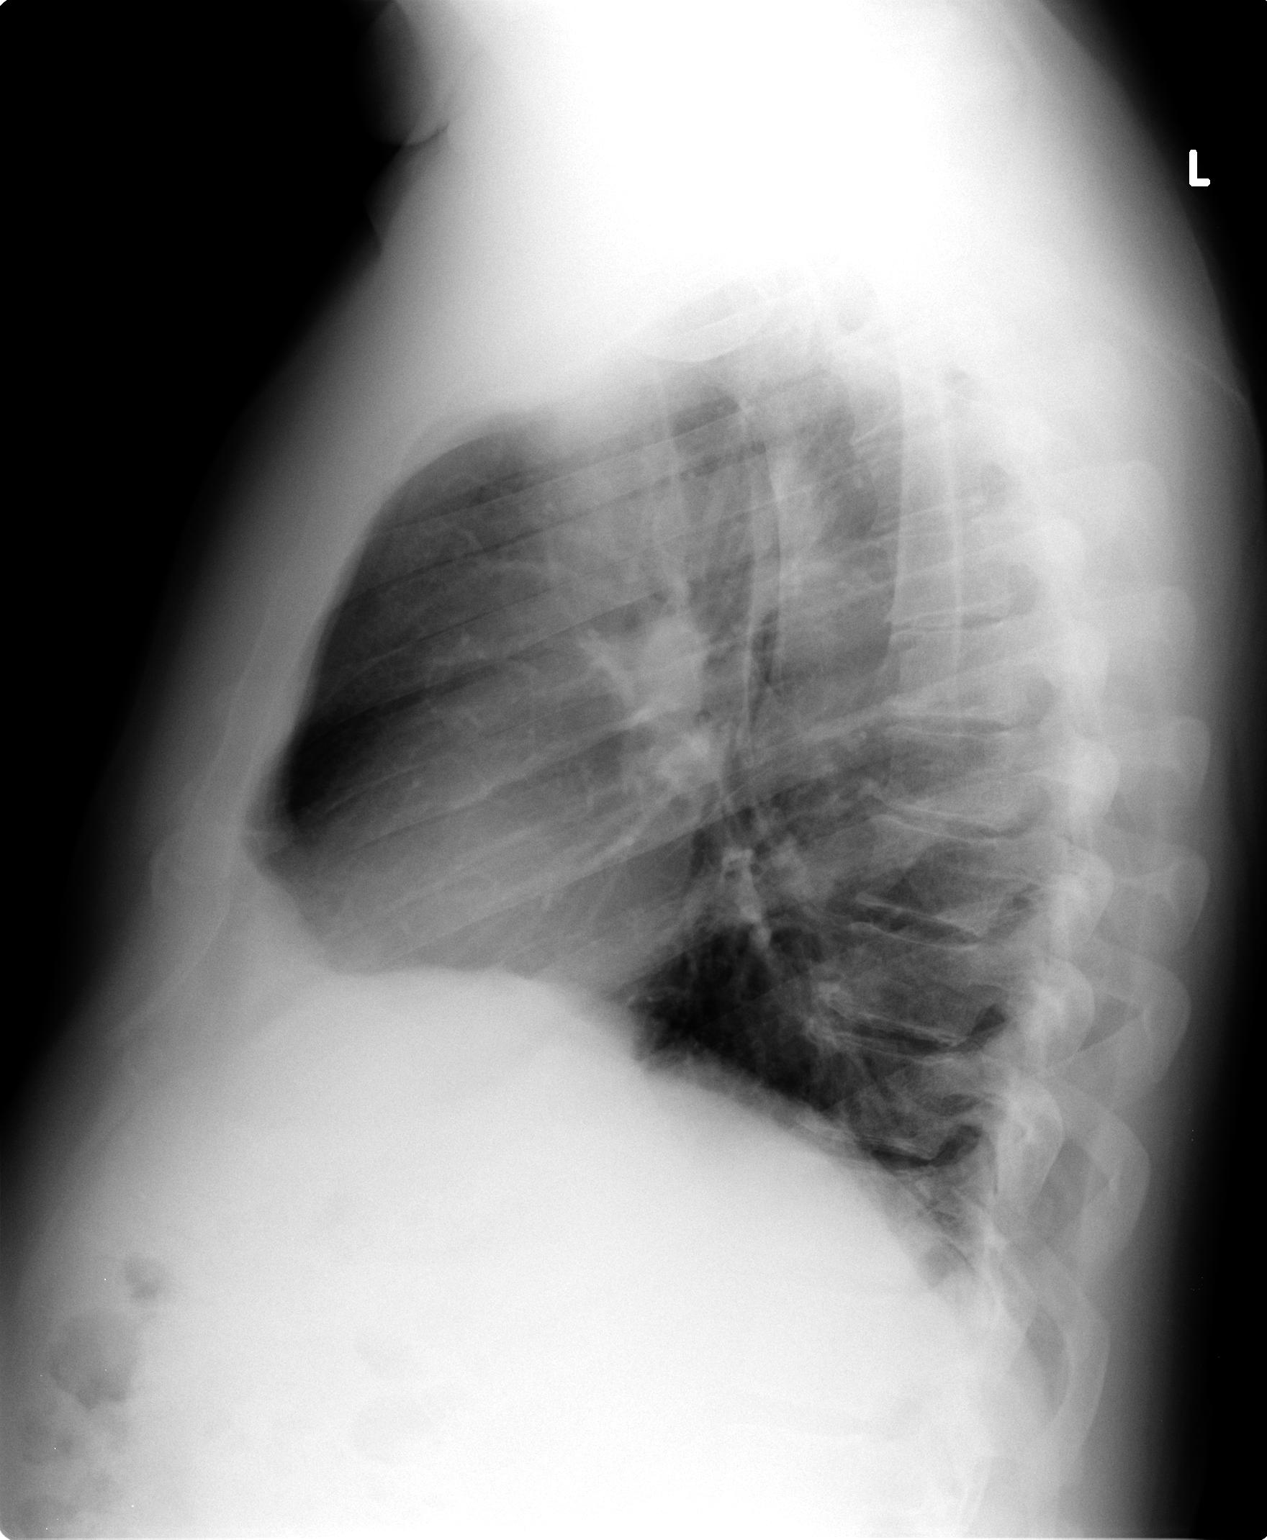

[2 of 2 positions shown; findings below may reference images not displayed]

FINDINGS: Heart size and vascular pattern normal. Lungs clear except for mild
scarring or atelectasis left lung base. No evidence of infiltrate or
effusion.
IMPRESSION: Mild left base scarring or atelectasis.  Otherwise negative.

## 2016-05-05 ENCOUNTER — Encounter: Payer: Self-pay | Admitting: Internal Medicine

## 2016-05-05 ENCOUNTER — Ambulatory Visit (INDEPENDENT_AMBULATORY_CARE_PROVIDER_SITE_OTHER): Admitting: Internal Medicine

## 2016-05-05 DIAGNOSIS — Z23 Encounter for immunization: Secondary | ICD-10-CM | POA: Diagnosis not present

## 2016-05-05 DIAGNOSIS — M159 Polyosteoarthritis, unspecified: Secondary | ICD-10-CM

## 2016-05-05 DIAGNOSIS — E291 Testicular hypofunction: Secondary | ICD-10-CM | POA: Diagnosis not present

## 2016-05-05 DIAGNOSIS — I10 Essential (primary) hypertension: Secondary | ICD-10-CM

## 2016-05-05 DIAGNOSIS — E669 Obesity, unspecified: Secondary | ICD-10-CM

## 2016-05-05 DIAGNOSIS — E785 Hyperlipidemia, unspecified: Secondary | ICD-10-CM | POA: Diagnosis not present

## 2016-05-05 DIAGNOSIS — M150 Primary generalized (osteo)arthritis: Secondary | ICD-10-CM

## 2016-05-05 DIAGNOSIS — F319 Bipolar disorder, unspecified: Secondary | ICD-10-CM

## 2016-05-05 DIAGNOSIS — N400 Enlarged prostate without lower urinary tract symptoms: Secondary | ICD-10-CM | POA: Diagnosis not present

## 2016-05-05 NOTE — Addendum Note (Signed)
Addended by: Cresenciano Lick on: 05/05/2016 11:34 AM   Modules accepted: Orders

## 2016-05-05 NOTE — Assessment & Plan Note (Signed)
Not on meds 

## 2016-05-05 NOTE — Assessment & Plan Note (Signed)
Toprol po 

## 2016-05-05 NOTE — Assessment & Plan Note (Signed)
  On diet  

## 2016-05-05 NOTE — Assessment & Plan Note (Signed)
s/p implant in the L buttock (Dr Gaynelle Arabian)

## 2016-05-05 NOTE — Assessment & Plan Note (Signed)
Viibrid Vyvance - stopped in 9/17 F/u w/Dr Clovis Pu, Dr Cheryln Manly

## 2016-05-05 NOTE — Progress Notes (Signed)
Pre visit review using our clinic review tool, if applicable. No additional management support is needed unless otherwise documented below in the visit note. 

## 2016-05-05 NOTE — Assessment & Plan Note (Signed)
Meloxicam prn Norco prn  Potential benefits of a long term opioids use as well as potential risks (i.e. addiction risk, apnea etc) and complications (i.e. Somnolence, constipation and others) were explained to the patient and were aknowledged.

## 2016-05-05 NOTE — Assessment & Plan Note (Signed)
Wt Readings from Last 3 Encounters:  05/05/16 214 lb (97.1 kg)  03/30/16 206 lb (93.4 kg)  09/08/15 212 lb (96.2 kg)

## 2016-05-05 NOTE — Progress Notes (Signed)
Subjective:  Patient ID: [name] [name], male    DOB: [birth year]  Age: 60 y.o. MRN: [id number]  CC: No chief complaint on file.   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 F/u LBP w/sciatica. F/u w/shoulder pain  Outpatient Medications Prior to Visit  Medication Sig Dispense Refill  . amphetamine-dextroamphetamine (ADDERALL XR) 10 MG 24 hr capsule Take 10 mg by mouth daily.    Marland Kitchen aspirin (BAYER ASPIRIN) 325 MG tablet Take 1 tablet (325 mg total) by mouth daily. 100 tablet 3  . lisdexamfetamine (VYVANSE) 60 MG capsule Take 1 capsule by mouth daily.    . meloxicam (MOBIC) 15 MG tablet Take 15 mg by mouth daily.    . metoprolol succinate (TOPROL XL) 25 MG 24 hr tablet Take 1 tablet (25 mg total) by mouth daily. 90 tablet 3  . pantoprazole (PROTONIX) 40 MG tablet Take 1 tablet (40 mg total) by mouth daily before breakfast. 90 tablet 3  . tadalafil (CIALIS) 5 MG tablet Take 1 tablet (5 mg total) by mouth daily as needed for erectile dysfunction. 90 tablet 3  . VIIBRYD 20 MG TABS Take 1 tablet by mouth daily.    . ciclopirox (PENLAC) 8 % solution Apply topically at bedtime. Apply over nail and surrounding skin. Apply daily over previous coat. After seven (7) days, may remove with alcohol and continue cycle. (Patient not taking: Reported on 05/05/2016) 6.6 mL 1  . cyclobenzaprine (FLEXERIL) 10 MG tablet Take 1 tablet (10 mg total) by mouth 3 (three) times daily as needed for muscle spasms. (Patient not taking: Reported on 05/05/2016) 60 tablet 1  . DYMISTA 137-50 MCG/ACT SUSP Place 2 sprays into both nostrils at bedtime as needed. (Patient not taking: Reported on 05/05/2016) 69 g 3  . HYDROcodone-acetaminophen (NORCO/VICODIN) 5-325 MG tablet Take 1 tablet by mouth 2 (two) times daily as needed for moderate pain or severe pain. (Patient not taking: Reported on 05/05/2016) 90 tablet 0  . lidocaine (XYLOCAINE) 5 % ointment     . methylphenidate 36 MG  PO CR tablet Take 1 tablet by mouth every morning.    . montelukast (SINGULAIR) 10 MG tablet Take 1 tablet (10 mg total) by mouth daily. (Patient not taking: Reported on 05/05/2016) 30 tablet 11  . triamcinolone ointment (KENALOG) 0.5 % Apply 1 application topically 2 (two) times daily. (Patient not taking: Reported on 05/05/2016) 45 g 3  . Testosterone (ANDROGEL) 20.25 MG/1.25GM (1.62%) GEL Place onto the skin.     No facility-administered medications prior to visit.     ROS Review of Systems  Constitutional: Positive for fatigue. Negative for appetite change and unexpected weight change.  HENT: Negative for congestion, nosebleeds, sneezing, sore throat and trouble swallowing.   Eyes: Negative for itching and visual disturbance.  Respiratory: Negative for cough.   Cardiovascular: Negative for chest pain, palpitations and leg swelling.  Gastrointestinal: Negative for abdominal distention, blood in stool, diarrhea and nausea.  Genitourinary: Negative for frequency and hematuria.  Musculoskeletal: Positive for arthralgias, back pain, myalgias, neck pain and neck stiffness. Negative for gait problem and joint swelling.  Skin: Negative for rash.  Neurological: Negative for dizziness, tremors, speech difficulty and weakness.  Psychiatric/Behavioral: Negative for agitation, dysphoric mood, sleep disturbance and suicidal ideas. The patient is nervous/anxious.     Objective:  BP 122/70   Pulse 70   Temp 98.5 F (36.9 C) (Oral)   Wt 214 lb (97.1  kg)   SpO2 95%   BMI 31.60 kg/m   BP Readings from Last 3 Encounters:  05/05/16 122/70  03/30/16 140/90  09/08/15 120/80    Wt Readings from Last 3 Encounters:  05/05/16 214 lb (97.1 kg)  03/30/16 206 lb (93.4 kg)  09/08/15 212 lb (96.2 kg)    Physical Exam  Constitutional: He is oriented to person, place, and time. He appears well-developed. No distress.  NAD  HENT:  Mouth/Throat: Oropharynx is clear and moist.  Eyes: Conjunctivae are  normal. Pupils are equal, round, and reactive to light.  Neck: Normal range of motion. No JVD present. No thyromegaly present.  Cardiovascular: Normal rate, regular rhythm, normal heart sounds and intact distal pulses.  Exam reveals no gallop and no friction rub.   No murmur heard. Pulmonary/Chest: Effort normal and breath sounds normal. No respiratory distress. He has no wheezes. He has no rales. He exhibits no tenderness.  Abdominal: Soft. Bowel sounds are normal. He exhibits no distension and no mass. There is no tenderness. There is no rebound and no guarding.  Musculoskeletal: Normal range of motion. He exhibits tenderness. He exhibits no edema.  Lymphadenopathy:    He has no cervical adenopathy.  Neurological: He is alert and oriented to person, place, and time. He has normal reflexes. No cranial nerve deficit. He exhibits normal muscle tone. He displays a negative Romberg sign. Coordination and gait normal.  Skin: Skin is warm and dry. No rash noted.  Psychiatric: He has a normal mood and affect. His behavior is normal. Judgment and thought content normal.    Lab Results  Component Value Date   WBC 4.4 03/30/2016   HGB 16.3 03/30/2016   HCT 48.1 03/30/2016   PLT 259.0 03/30/2016   GLUCOSE 100 (H) 09/03/2015   CHOL 222 (H) 11/28/2014   TRIG 88.0 11/28/2014   HDL 49.90 11/28/2014   LDLDIRECT 77.5 12/26/2008   LDLCALC 155 (H) 11/28/2014   ALT 28 11/28/2014   AST 17 11/28/2014   NA 137 09/03/2015   K 4.5 09/03/2015   CL 100 09/03/2015   CREATININE 1.12 09/03/2015   BUN 26 (H) 09/03/2015   CO2 33 (H) 09/03/2015   TSH 1.95 11/28/2014   PSA 2.17 01/25/2010   HGBA1C 6.0 07/03/2014    Ct Maxillofacial Wo Cm  Result Date: 07/20/2015 CLINICAL DATA:  Chronic maxillary sinusitis EXAM: CT MAXILLOFACIAL WITHOUT CONTRAST TECHNIQUE: Multidetector CT imaging of the maxillofacial structures was performed. Multiplanar CT image reconstructions were also generated. A small metallic BB was  placed on the right temple in order to reliably differentiate right from left. COMPARISON:  None. FINDINGS: Mild mucosal edema in the maxillary sinus bilaterally without air-fluid level. Narrowing of the ostiomeatal complex bilaterally due to mucosal edema. Mucosal edema in the frontal, sphenoid, and ethmoid sinuses. No air-fluid level Nasal septum slightly deviated to the left. Negative for fracture. No acute bony abnormality. Normal orbit and surrounding soft tissues. Limited intracranial imaging negative. IMPRESSION: Mild mucosal edema throughout the paranasal sinuses without air-fluid level. Electronically Signed   By: Franchot Gallo M.D.   On: 07/20/2015 17:01    Assessment & Plan:   There are no diagnoses linked to this encounter. I have discontinued Mr. Ackermann Testosterone. I am also having him maintain his meloxicam, lidocaine, VIIBRYD, methylphenidate, aspirin, montelukast, amphetamine-dextroamphetamine, lisdexamfetamine, ciclopirox, DYMISTA, cyclobenzaprine, HYDROcodone-acetaminophen, pantoprazole, metoprolol succinate, tadalafil, and triamcinolone ointment.  No orders of the defined types were placed in this encounter.    Follow-up: No Follow-up  on file.  Walker Kehr, MD

## 2016-05-06 ENCOUNTER — Other Ambulatory Visit (INDEPENDENT_AMBULATORY_CARE_PROVIDER_SITE_OTHER)

## 2016-05-06 DIAGNOSIS — E291 Testicular hypofunction: Secondary | ICD-10-CM

## 2016-05-06 DIAGNOSIS — Z0000 Encounter for general adult medical examination without abnormal findings: Secondary | ICD-10-CM | POA: Diagnosis not present

## 2016-05-06 DIAGNOSIS — R7989 Other specified abnormal findings of blood chemistry: Secondary | ICD-10-CM

## 2016-05-06 LAB — URINALYSIS
Bilirubin Urine: NEGATIVE
HGB URINE DIPSTICK: NEGATIVE
KETONES UR: NEGATIVE
Leukocytes, UA: NEGATIVE
Nitrite: NEGATIVE
Specific Gravity, Urine: 1.025 (ref 1.000–1.030)
URINE GLUCOSE: NEGATIVE
Urobilinogen, UA: 0.2 (ref 0.0–1.0)
pH: 6 (ref 5.0–8.0)

## 2016-05-06 LAB — HEPATIC FUNCTION PANEL
ALBUMIN: 3.9 g/dL (ref 3.5–5.2)
ALT: 21 U/L (ref 0–53)
AST: 16 U/L (ref 0–37)
Alkaline Phosphatase: 48 U/L (ref 39–117)
BILIRUBIN TOTAL: 0.5 mg/dL (ref 0.2–1.2)
Bilirubin, Direct: 0.1 mg/dL (ref 0.0–0.3)
Total Protein: 6.6 g/dL (ref 6.0–8.3)

## 2016-05-06 LAB — LIPID PANEL
Cholesterol: 183 mg/dL (ref 0–200)
HDL: 38.2 mg/dL — ABNORMAL LOW (ref >39.00)
LDL CALC: 122 mg/dL — AB (ref 0–99)
NONHDL: 145.28
Total CHOL/HDL Ratio: 5
Triglycerides: 114 mg/dL (ref 0.0–149.0)
VLDL: 22.8 mg/dL (ref 0.0–40.0)

## 2016-05-06 LAB — BASIC METABOLIC PANEL
BUN: 23 mg/dL (ref 6–23)
CALCIUM: 8.8 mg/dL (ref 8.4–10.5)
CO2: 30 mEq/L (ref 19–32)
CREATININE: 1.03 mg/dL (ref 0.40–1.50)
Chloride: 101 mEq/L (ref 96–112)
GFR: 77.65 mL/min (ref >60.00)
GLUCOSE: 122 mg/dL — AB (ref 70–99)
POTASSIUM: 4.6 meq/L (ref 3.5–5.1)
Sodium: 139 mEq/L (ref 135–145)

## 2016-05-06 LAB — TESTOSTERONE: Testosterone: 872.42 ng/dL (ref 300.00–890.00)

## 2016-05-06 LAB — PSA: PSA: 2.5 ng/mL (ref 0.10–4.00)

## 2016-05-06 LAB — TSH: TSH: 1.6 u[IU]/mL (ref 0.35–4.50)

## 2016-05-07 LAB — HEPATITIS C ANTIBODY: HCV AB: NEGATIVE

## 2016-05-10 ENCOUNTER — Ambulatory Visit (INDEPENDENT_AMBULATORY_CARE_PROVIDER_SITE_OTHER): Admitting: Psychology

## 2016-05-10 DIAGNOSIS — F3181 Bipolar II disorder: Secondary | ICD-10-CM

## 2016-07-11 ENCOUNTER — Other Ambulatory Visit: Payer: Self-pay | Admitting: Neurosurgery

## 2016-07-11 ENCOUNTER — Other Ambulatory Visit: Payer: Self-pay | Admitting: Neurological Surgery

## 2016-08-04 ENCOUNTER — Other Ambulatory Visit: Payer: Self-pay | Admitting: *Deleted

## 2016-08-04 MED ORDER — MELOXICAM 15 MG PO TABS: 15.0000 mg | ORAL_TABLET | Freq: Every day | ORAL | 1 refills | Status: DC

## 2016-08-04 MED ORDER — MELOXICAM 15 MG PO TABS: 15.0000 mg | ORAL_TABLET | Freq: Every day | ORAL | 0 refills | Status: DC

## 2016-08-04 NOTE — Telephone Encounter (Signed)
Rec'd call pt states he is needing refill on [facility], but since he is currently out would like 30 day sent to local [facility]. Notified pt will send to both pharmacy...[name] [name]

## 2016-08-15 HISTORY — PX: HX LUMBAR FUSION: SHX111

## 2016-09-01 ENCOUNTER — Encounter (HOSPITAL_COMMUNITY)
Admission: RE | Admit: 2016-09-01 | Discharge: 2016-09-01 | Disposition: A | Discharge status: Home / Self Care | Source: Ambulatory Visit | Attending: Neurosurgery | Admitting: Neurosurgery

## 2016-09-01 ENCOUNTER — Encounter (HOSPITAL_COMMUNITY): Payer: Self-pay

## 2016-09-01 DIAGNOSIS — M48061 Spinal stenosis, lumbar region without neurogenic claudication: Secondary | ICD-10-CM | POA: Insufficient documentation

## 2016-09-01 DIAGNOSIS — G5601 Carpal tunnel syndrome, right upper limb: Secondary | ICD-10-CM | POA: Diagnosis not present

## 2016-09-01 DIAGNOSIS — Z01818 Encounter for other preprocedural examination: Secondary | ICD-10-CM | POA: Diagnosis present

## 2016-09-01 HISTORY — DX: Personal history of other diseases of the respiratory system: Z87.09

## 2016-09-01 HISTORY — DX: Personal history of colonic polyps: Z86.010

## 2016-09-01 HISTORY — DX: Restless legs syndrome: G25.81

## 2016-09-01 HISTORY — DX: Dyspnea, unspecified: R06.00

## 2016-09-01 HISTORY — DX: Essential (primary) hypertension: I10

## 2016-09-01 HISTORY — DX: Personal history of other diseases of the digestive system: Z87.19

## 2016-09-01 HISTORY — DX: Dorsalgia, unspecified: M54.9

## 2016-09-01 HISTORY — DX: Carpal tunnel syndrome, unspecified upper limb: G56.00

## 2016-09-01 HISTORY — DX: Other muscle spasm: M62.838

## 2016-09-01 HISTORY — DX: Benign prostatic hyperplasia without lower urinary tract symptoms: N40.0

## 2016-09-01 HISTORY — DX: Other chronic pain: G89.29

## 2016-09-01 HISTORY — DX: Adverse effect of unspecified anesthetic, initial encounter: T41.45XA

## 2016-09-01 HISTORY — DX: Other complications of anesthesia, initial encounter: T88.59XA

## 2016-09-01 HISTORY — DX: Personal history of colon polyps, unspecified: Z86.0100

## 2016-09-01 HISTORY — DX: Weakness: R53.1

## 2016-09-01 LAB — CBC
HCT: 47.4 % (ref 39.0–52.0)
Hemoglobin: 14.5 g/dL (ref 13.0–17.0)
MCH: 26.1 pg (ref 26.0–34.0)
MCHC: 30.6 g/dL (ref 30.0–36.0)
MCV: 85.4 fL (ref 78.0–100.0)
PLATELETS: 252 10*3/uL (ref 150–400)
RBC: 5.55 MIL/uL (ref 4.22–5.81)
RDW: 18.4 % — AB (ref 11.5–15.5)
WBC: 4.7 10*3/uL (ref 4.0–10.5)

## 2016-09-01 LAB — TYPE AND SCREEN
ABO/RH(D): A POS
Antibody Screen: NEGATIVE

## 2016-09-01 LAB — BASIC METABOLIC PANEL
ANION GAP: 7 (ref 5–15)
BUN: 28 mg/dL — AB (ref 6–20)
CHLORIDE: 105 mmol/L (ref 101–111)
CO2: 26 mmol/L (ref 22–32)
Calcium: 9.2 mg/dL (ref 8.9–10.3)
Creatinine, Ser: 1.08 mg/dL (ref 0.61–1.24)
GFR calc Af Amer: >60 mL/min (ref >60)
GFR calc non Af Amer: >60 mL/min (ref >60)
GLUCOSE: 100 mg/dL — AB (ref 65–99)
POTASSIUM: 4.5 mmol/L (ref 3.5–5.1)
Sodium: 138 mmol/L (ref 135–145)

## 2016-09-01 LAB — SURGICAL PCR SCREEN
MRSA, PCR: NEGATIVE
STAPHYLOCOCCUS AUREUS: NEGATIVE

## 2016-09-01 LAB — ABO/RH: ABO/RH(D): A POS

## 2016-09-01 MED ORDER — CHLORHEXIDINE GLUCONATE CLOTH 2 % EX PADS: 6.0000 | MEDICATED_PAD | Freq: Once | CUTANEOUS | Status: DC

## 2016-09-01 NOTE — Pre-Procedure Instructions (Signed)
[name] [name]  09/01/2016      [facility] - [place], [place] [address] [place] [address] Phone: [phone] Fax: [phone]  [facility] [place], [place] - [address] AT [place] [address] Phone: [phone] Fax: [phone]    Your procedure is scheduled on Fri, Jan 26 @ 9:20 AM  Report to [facility] at 7:20 AM  Call this number if you have problems the morning of surgery:  [phone]   Remember:  Do not eat food or drink liquids after midnight.  Take these medicines the morning of surgery with A SIP OF WATER Pain Pill(if needed) and Pantoprazole(Protonix)             Stop taking Mobic(Meloxicam) and Aspirin along with any Vitamins or Herbal Medications. No Goody's,BC's,Aleve,Advil,Motrin,or Fish Oil.   Do not wear jewelry.  Do not wear lotions, powders,colognes, or deoderant.  Men may shave face and neck.  Do not bring valuables to the hospital.  [facility] is not responsible for any belongings or valuables.  Contacts, dentures or bridgework may not be worn into surgery.  Leave your suitcase in the car.  After surgery it may be brought to your room.  For patients admitted to the hospital, discharge time will be determined by your treatment team.  Patients discharged the day of surgery will not be allowed to drive home.    [facility]  Before surgery, you can play an important role.  Because skin is not sterile, your skin needs to be as free of germs as possible.  You can reduce the number of germs on you skin by washing with CHG (chlorahexidine gluconate) soap before surgery.  CHG is an antiseptic cleaner which kills germs and bonds with the skin to continue killing germs even after washing.  Please DO NOT use if you have an allergy to CHG or antibacterial soaps.  If your  skin becomes reddened/irritated stop using the CHG and inform your nurse when you arrive at Short Stay.  Do not shave (including legs and underarms) for at least 48 hours prior to the first CHG shower.  You may shave your face.  Please follow these instructions carefully:   1.  Shower with CHG Soap the night before surgery and the                                morning of Surgery.  2.  If you choose to wash your hair, wash your hair first as usual with your       normal shampoo.  3.  After you shampoo, rinse your hair and body thoroughly to remove the                      Shampoo.  4.  Use CHG as you would any other liquid soap.  You can apply chg directly       to the skin and wash gently with scrungie or a clean washcloth.  5.  Apply the CHG Soap to your body ONLY FROM THE NECK DOWN.        Do not use on open wounds or open sores.  Avoid contact with your eyes,       ears, mouth and genitals (  private parts).  Wash genitals (private parts)       with your normal soap.  6.  Wash thoroughly, paying special attention to the area where your surgery        will be performed.  7.  Thoroughly rinse your body with warm water from the neck down.  8.  DO NOT shower/wash with your normal soap after using and rinsing off       the CHG Soap.  9.  Pat yourself dry with a clean towel.            10.  Wear clean pajamas.            11.  Place clean sheets on your bed the night of your first shower and do not        sleep with pets.  Day of Surgery  Do not apply any lotions/deoderants the morning of surgery.  Please wear clean clothes to the hospital/surgery center.      Please read over the following fact sheets that you were given. Pain Booklet, Coughing and Deep Breathing, MRSA Information and Surgical Site Infection Prevention

## 2016-09-01 NOTE — Progress Notes (Addendum)
Saw a cardiologist > 7 yrs ago but only saw 1-2 times.Saw in order to get a baseline  Medical Md is Dr.Aleksei Plotnikov  Echo and Stress test report in epic from 2015  Heart cath denies  EKG denies in past yr   CXR denies in past yr

## 2016-09-13 ENCOUNTER — Telehealth: Payer: Self-pay | Admitting: Internal Medicine

## 2016-09-13 NOTE — Telephone Encounter (Signed)
Pt is requesting refill of hydrocodone, please advise MS

## 2016-09-14 NOTE — Telephone Encounter (Signed)
pls advise on msg below.../lmb 

## 2016-09-14 NOTE — Telephone Encounter (Signed)
OK to fill this/these prescription(s) with additional refills x0 Needs to have an OV every 3 months Thank you!  

## 2016-09-15 ENCOUNTER — Inpatient Hospital Stay (HOSPITAL_COMMUNITY): Admitting: Certified Registered Nurse Anesthetist

## 2016-09-15 ENCOUNTER — Encounter (HOSPITAL_COMMUNITY)
Admission: RE | Disposition: A | Discharge status: Home / Self Care | Payer: Self-pay | Source: Ambulatory Visit | Attending: Neurosurgery

## 2016-09-15 ENCOUNTER — Inpatient Hospital Stay (HOSPITAL_COMMUNITY)
Admission: RE | Admit: 2016-09-15 | Discharge: 2016-09-18 | DRG: 454 | Disposition: A | Discharge status: Home / Self Care | Source: Ambulatory Visit | Attending: Neurosurgery | Admitting: Neurosurgery

## 2016-09-15 ENCOUNTER — Encounter (HOSPITAL_COMMUNITY): Payer: Self-pay | Admitting: Surgery

## 2016-09-15 ENCOUNTER — Other Ambulatory Visit: Payer: Self-pay

## 2016-09-15 ENCOUNTER — Inpatient Hospital Stay (HOSPITAL_COMMUNITY)

## 2016-09-15 DIAGNOSIS — F319 Bipolar disorder, unspecified: Secondary | ICD-10-CM | POA: Diagnosis present

## 2016-09-15 DIAGNOSIS — I9788 Other intraoperative complications of the circulatory system, not elsewhere classified: Secondary | ICD-10-CM | POA: Diagnosis not present

## 2016-09-15 DIAGNOSIS — Z8249 Family history of ischemic heart disease and other diseases of the circulatory system: Secondary | ICD-10-CM | POA: Diagnosis not present

## 2016-09-15 DIAGNOSIS — Z7982 Long term (current) use of aspirin: Secondary | ICD-10-CM

## 2016-09-15 DIAGNOSIS — F419 Anxiety disorder, unspecified: Secondary | ICD-10-CM | POA: Diagnosis present

## 2016-09-15 DIAGNOSIS — Z86010 Personal history of colonic polyps: Secondary | ICD-10-CM | POA: Diagnosis not present

## 2016-09-15 DIAGNOSIS — I9589 Other hypotension: Secondary | ICD-10-CM | POA: Diagnosis not present

## 2016-09-15 DIAGNOSIS — Z23 Encounter for immunization: Secondary | ICD-10-CM

## 2016-09-15 DIAGNOSIS — G2581 Restless legs syndrome: Secondary | ICD-10-CM | POA: Diagnosis present

## 2016-09-15 DIAGNOSIS — Z881 Allergy status to other antibiotic agents status: Secondary | ICD-10-CM | POA: Diagnosis not present

## 2016-09-15 DIAGNOSIS — G5611 Other lesions of median nerve, right upper limb: Secondary | ICD-10-CM | POA: Diagnosis present

## 2016-09-15 DIAGNOSIS — I10 Essential (primary) hypertension: Secondary | ICD-10-CM | POA: Diagnosis present

## 2016-09-15 DIAGNOSIS — M48062 Spinal stenosis, lumbar region with neurogenic claudication: Secondary | ICD-10-CM | POA: Diagnosis present

## 2016-09-15 DIAGNOSIS — R001 Bradycardia, unspecified: Secondary | ICD-10-CM | POA: Diagnosis not present

## 2016-09-15 DIAGNOSIS — K219 Gastro-esophageal reflux disease without esophagitis: Secondary | ICD-10-CM | POA: Diagnosis present

## 2016-09-15 DIAGNOSIS — M5116 Intervertebral disc disorders with radiculopathy, lumbar region: Secondary | ICD-10-CM | POA: Diagnosis present

## 2016-09-15 DIAGNOSIS — Z9079 Acquired absence of other genital organ(s): Secondary | ICD-10-CM

## 2016-09-15 DIAGNOSIS — G8929 Other chronic pain: Secondary | ICD-10-CM | POA: Diagnosis present

## 2016-09-15 DIAGNOSIS — Z79899 Other long term (current) drug therapy: Secondary | ICD-10-CM | POA: Diagnosis not present

## 2016-09-15 DIAGNOSIS — Z888 Allergy status to other drugs, medicaments and biological substances status: Secondary | ICD-10-CM

## 2016-09-15 DIAGNOSIS — Z8711 Personal history of peptic ulcer disease: Secondary | ICD-10-CM

## 2016-09-15 DIAGNOSIS — G4733 Obstructive sleep apnea (adult) (pediatric): Secondary | ICD-10-CM | POA: Diagnosis present

## 2016-09-15 DIAGNOSIS — Z419 Encounter for procedure for purposes other than remedying health state, unspecified: Secondary | ICD-10-CM | POA: Diagnosis not present

## 2016-09-15 HISTORY — PX: CARPAL TUNNEL RELEASE: SHX101

## 2016-09-15 HISTORY — PX: MAXIMUM ACCESS (MAS)POSTERIOR LUMBAR INTERBODY FUSION (PLIF) 2 LEVEL: SHX6369

## 2016-09-15 SURGERY — FOR MAXIMUM ACCESS (MAS) POSTERIOR LUMBAR INTERBODY FUSION (PLIF) 2 LEVEL
Anesthesia: General | Site: Wrist | Laterality: Right

## 2016-09-15 MED ORDER — MORPHINE SULFATE (PF) 2 MG/ML IV SOLN
1.0000 mg | INTRAVENOUS | Status: DC | PRN
  Administered 2016-09-15: 4 mg via INTRAVENOUS
  Filled 2016-09-15: qty 2

## 2016-09-15 MED ORDER — MENTHOL 3 MG MT LOZG: 1.0000 | LOZENGE | OROMUCOSAL | Status: DC | PRN

## 2016-09-15 MED ORDER — SODIUM CHLORIDE 0.9 % IV SOLN: 250.0000 mL | INTRAVENOUS | Status: DC

## 2016-09-15 MED ORDER — BUPIVACAINE LIPOSOME 1.3 % IJ SUSP
INTRAMUSCULAR | Status: DC | PRN
  Administered 2016-09-15: 20 mL

## 2016-09-15 MED ORDER — EPHEDRINE SULFATE-NACL 50-0.9 MG/10ML-% IV SOSY
PREFILLED_SYRINGE | INTRAVENOUS | Status: DC | PRN
  Administered 2016-09-15 (×2): 10 mg via INTRAVENOUS

## 2016-09-15 MED ORDER — PROMETHAZINE HCL 25 MG/ML IJ SOLN
12.5000 mg | INTRAMUSCULAR | Status: DC | PRN
  Administered 2016-09-15: 12.5 mg via INTRAVENOUS
  Filled 2016-09-15: qty 1

## 2016-09-15 MED ORDER — OXYCODONE-ACETAMINOPHEN 5-325 MG PO TABS
1.0000 | ORAL_TABLET | ORAL | Status: DC | PRN
  Administered 2016-09-17 – 2016-09-18 (×7): 2 via ORAL
  Filled 2016-09-15 (×8): qty 2

## 2016-09-15 MED ORDER — METHOCARBAMOL 500 MG PO TABS
500.0000 mg | ORAL_TABLET | Freq: Four times a day (QID) | ORAL | Status: DC | PRN
  Administered 2016-09-17 – 2016-09-18 (×5): 500 mg via ORAL
  Filled 2016-09-15 (×6): qty 1

## 2016-09-15 MED ORDER — BISACODYL 10 MG RE SUPP: 10.0000 mg | Freq: Every day | RECTAL | Status: DC | PRN

## 2016-09-15 MED ORDER — ONDANSETRON HCL 4 MG/2ML IJ SOLN
INTRAMUSCULAR | Status: AC
  Filled 2016-09-15: qty 2

## 2016-09-15 MED ORDER — LIDOCAINE HCL (PF) 0.5 % IJ SOLN
INTRAMUSCULAR | Status: AC
  Filled 2016-09-15: qty 50

## 2016-09-15 MED ORDER — ATROPINE SULFATE 0.4 MG/ML IJ SOLN
INTRAMUSCULAR | Status: DC | PRN
  Administered 2016-09-15: .4 mg via INTRAVENOUS
  Administered 2016-09-15: .8 mg via INTRAVENOUS
  Administered 2016-09-15: 1.2 mg via INTRAVENOUS

## 2016-09-15 MED ORDER — PANTOPRAZOLE SODIUM 40 MG PO TBEC: 40.0000 mg | DELAYED_RELEASE_TABLET | Freq: Every day | ORAL | Status: DC

## 2016-09-15 MED ORDER — CYCLOBENZAPRINE HCL 10 MG PO TABS
10.0000 mg | ORAL_TABLET | Freq: Three times a day (TID) | ORAL | Status: DC | PRN
  Administered 2016-09-15: 10 mg via ORAL
  Filled 2016-09-15: qty 1

## 2016-09-15 MED ORDER — FENTANYL CITRATE (PF) 100 MCG/2ML IJ SOLN
INTRAMUSCULAR | Status: AC
  Filled 2016-09-15: qty 2

## 2016-09-15 MED ORDER — OXYCODONE-ACETAMINOPHEN 5-325 MG PO TABS: 1.0000 | ORAL_TABLET | Freq: Two times a day (BID) | ORAL | Status: DC | PRN

## 2016-09-15 MED ORDER — HYDROCODONE-ACETAMINOPHEN 5-325 MG PO TABS
1.0000 | ORAL_TABLET | Freq: Two times a day (BID) | ORAL | Status: DC | PRN
  Administered 2016-09-16 (×2): 2 via ORAL
  Filled 2016-09-15 (×3): qty 2

## 2016-09-15 MED ORDER — SUCCINYLCHOLINE CHLORIDE 200 MG/10ML IV SOSY
PREFILLED_SYRINGE | INTRAVENOUS | Status: DC | PRN
Start: 2016-09-15 — End: 2016-09-15
  Administered 2016-09-15: 140 mg via INTRAVENOUS

## 2016-09-15 MED ORDER — ACETAMINOPHEN 325 MG PO TABS: 650.0000 mg | ORAL_TABLET | ORAL | Status: DC | PRN

## 2016-09-15 MED ORDER — FENTANYL CITRATE (PF) 100 MCG/2ML IJ SOLN
INTRAMUSCULAR | Status: AC
  Filled 2016-09-15: qty 4

## 2016-09-15 MED ORDER — BUPIVACAINE HCL (PF) 0.5 % IJ SOLN
INTRAMUSCULAR | Status: DC | PRN
  Administered 2016-09-15: 8 mL
  Administered 2016-09-15: 2 mL
  Administered 2016-09-15: 5 mL

## 2016-09-15 MED ORDER — HYDROCODONE-ACETAMINOPHEN 5-325 MG PO TABS: 1.0000 | ORAL_TABLET | Freq: Two times a day (BID) | ORAL | 0 refills | Status: DC | PRN

## 2016-09-15 MED ORDER — MIDAZOLAM HCL 2 MG/2ML IJ SOLN
INTRAMUSCULAR | Status: AC
  Filled 2016-09-15: qty 4

## 2016-09-15 MED ORDER — FENTANYL CITRATE (PF) 100 MCG/2ML IJ SOLN
INTRAMUSCULAR | Status: DC | PRN
  Administered 2016-09-15: 100 ug via INTRAVENOUS
  Administered 2016-09-15: 50 ug via INTRAVENOUS
  Administered 2016-09-15: 100 ug via INTRAVENOUS
  Administered 2016-09-15: 50 ug via INTRAVENOUS

## 2016-09-15 MED ORDER — PHENYLEPHRINE HCL 10 MG/ML IJ SOLN
INTRAVENOUS | Status: DC | PRN
  Administered 2016-09-15: 20 ug/min via INTRAVENOUS

## 2016-09-15 MED ORDER — AMPHETAMINE-DEXTROAMPHET ER 10 MG PO CP24: 10.0000 mg | ORAL_CAPSULE | Freq: Every day | ORAL | Status: DC | PRN

## 2016-09-15 MED ORDER — CEFAZOLIN SODIUM-DEXTROSE 2-4 GM/100ML-% IV SOLN
INTRAVENOUS | Status: AC
  Filled 2016-09-15: qty 100

## 2016-09-15 MED ORDER — BUPROPION HCL 100 MG PO TABS
100.0000 mg | ORAL_TABLET | Freq: Three times a day (TID) | ORAL | Status: DC
  Administered 2016-09-16 – 2016-09-17 (×6): 100 mg via ORAL
  Filled 2016-09-15 (×12): qty 1

## 2016-09-15 MED ORDER — POLYETHYLENE GLYCOL 3350 17 G PO PACK
17.0000 g | PACK | Freq: Every day | ORAL | Status: DC | PRN
  Administered 2016-09-17: 17 g via ORAL
  Filled 2016-09-15: qty 1

## 2016-09-15 MED ORDER — ONDANSETRON HCL 4 MG/2ML IJ SOLN
4.0000 mg | INTRAMUSCULAR | Status: DC | PRN
  Administered 2016-09-15 – 2016-09-16 (×3): 4 mg via INTRAVENOUS
  Filled 2016-09-15 (×2): qty 2

## 2016-09-15 MED ORDER — SUGAMMADEX SODIUM 200 MG/2ML IV SOLN
INTRAVENOUS | Status: AC
  Filled 2016-09-15: qty 2

## 2016-09-15 MED ORDER — CEFAZOLIN SODIUM 1 G IJ SOLR
INTRAMUSCULAR | Status: AC
  Filled 2016-09-15: qty 20

## 2016-09-15 MED ORDER — ZOLPIDEM TARTRATE 5 MG PO TABS: 5.0000 mg | ORAL_TABLET | Freq: Every evening | ORAL | Status: DC | PRN

## 2016-09-15 MED ORDER — LACTATED RINGERS IV SOLN
INTRAVENOUS | Status: DC | PRN
  Administered 2016-09-15 (×2): via INTRAVENOUS

## 2016-09-15 MED ORDER — CEFAZOLIN SODIUM-DEXTROSE 2-4 GM/100ML-% IV SOLN
2.0000 g | INTRAVENOUS | Status: AC
  Administered 2016-09-15 (×2): 2 g via INTRAVENOUS

## 2016-09-15 MED ORDER — KCL IN DEXTROSE-NACL 20-5-0.45 MEQ/L-%-% IV SOLN
INTRAVENOUS | Status: DC
  Administered 2016-09-15: 23:00:00 via INTRAVENOUS
  Filled 2016-09-15 (×2): qty 1000

## 2016-09-15 MED ORDER — MORPHINE SULFATE (PF) 2 MG/ML IV SOLN
1.0000 mg | INTRAVENOUS | Status: DC | PRN
Start: 2016-09-16 — End: 2016-09-18
  Administered 2016-09-15: 2 mg via INTRAVENOUS
  Administered 2016-09-16 – 2016-09-17 (×6): 4 mg via INTRAVENOUS
  Filled 2016-09-15 (×10): qty 2

## 2016-09-15 MED ORDER — MELOXICAM 15 MG PO TABS: 15.0000 mg | ORAL_TABLET | Freq: Every day | ORAL | Status: DC

## 2016-09-15 MED ORDER — SODIUM CHLORIDE 0.9% FLUSH
3.0000 mL | Freq: Two times a day (BID) | INTRAVENOUS | Status: DC
  Administered 2016-09-16 – 2016-09-18 (×5): 3 mL via INTRAVENOUS

## 2016-09-15 MED ORDER — ROCURONIUM BROMIDE 50 MG/5ML IV SOSY
PREFILLED_SYRINGE | INTRAVENOUS | Status: AC
  Filled 2016-09-15: qty 5

## 2016-09-15 MED ORDER — THROMBIN 5000 UNITS EX SOLR
CUTANEOUS | Status: AC
  Filled 2016-09-15: qty 5000

## 2016-09-15 MED ORDER — EPINEPHRINE PF 1 MG/10ML IJ SOSY
PREFILLED_SYRINGE | INTRAMUSCULAR | Status: AC
  Filled 2016-09-15: qty 10

## 2016-09-15 MED ORDER — ACETAMINOPHEN 650 MG RE SUPP: 650.0000 mg | RECTAL | Status: DC | PRN

## 2016-09-15 MED ORDER — LACTATED RINGERS IV SOLN
INTRAVENOUS | Status: DC
  Administered 2016-09-15 (×4): via INTRAVENOUS

## 2016-09-15 MED ORDER — DEXMEDETOMIDINE HCL IN NACL 400 MCG/100ML IV SOLN
INTRAVENOUS | Status: DC | PRN
  Administered 2016-09-15: .3 ug/kg/h via INTRAVENOUS

## 2016-09-15 MED ORDER — BUPIVACAINE LIPOSOME 1.3 % IJ SUSP
20.0000 mL | Freq: Once | INTRAMUSCULAR | Status: DC
  Filled 2016-09-15: qty 20

## 2016-09-15 MED ORDER — PANTOPRAZOLE SODIUM 40 MG IV SOLR
40.0000 mg | Freq: Every day | INTRAVENOUS | Status: DC
  Administered 2016-09-15 – 2016-09-16 (×2): 40 mg via INTRAVENOUS
  Filled 2016-09-15 (×2): qty 40

## 2016-09-15 MED ORDER — LISDEXAMFETAMINE DIMESYLATE 30 MG PO CAPS
60.0000 mg | ORAL_CAPSULE | Freq: Every day | ORAL | Status: DC
  Filled 2016-09-15 (×3): qty 2

## 2016-09-15 MED ORDER — OXYCODONE HCL 5 MG/5ML PO SOLN: 5.0000 mg | Freq: Once | ORAL | Status: DC | PRN

## 2016-09-15 MED ORDER — ASPIRIN EC 81 MG PO TBEC
81.0000 mg | DELAYED_RELEASE_TABLET | Freq: Every day | ORAL | Status: DC
  Administered 2016-09-17 – 2016-09-18 (×2): 81 mg via ORAL
  Filled 2016-09-15 (×3): qty 1

## 2016-09-15 MED ORDER — ARTIFICIAL TEARS OP OINT
TOPICAL_OINTMENT | OPHTHALMIC | Status: AC
  Filled 2016-09-15: qty 3.5

## 2016-09-15 MED ORDER — LIDOCAINE-EPINEPHRINE (PF) 2 %-1:200000 IJ SOLN
INTRAMUSCULAR | Status: AC
  Filled 2016-09-15: qty 20

## 2016-09-15 MED ORDER — CEFAZOLIN SODIUM-DEXTROSE 2-4 GM/100ML-% IV SOLN
2.0000 g | Freq: Three times a day (TID) | INTRAVENOUS | Status: AC
  Administered 2016-09-16 (×2): 2 g via INTRAVENOUS
  Filled 2016-09-15 (×2): qty 100

## 2016-09-15 MED ORDER — EPHEDRINE 5 MG/ML INJ
INTRAVENOUS | Status: AC
  Filled 2016-09-15: qty 10

## 2016-09-15 MED ORDER — ETOMIDATE 2 MG/ML IV SOLN
INTRAVENOUS | Status: DC | PRN
  Administered 2016-09-15: 20 mg via INTRAVENOUS

## 2016-09-15 MED ORDER — SODIUM CHLORIDE 0.9% FLUSH: 3.0000 mL | INTRAVENOUS | Status: DC | PRN

## 2016-09-15 MED ORDER — SODIUM CHLORIDE 0.9 % IV SOLN
0.0500 ug/kg/min | INTRAVENOUS | Status: DC
  Administered 2016-09-15: 0.05 ug/kg/min via INTRAVENOUS
  Filled 2016-09-15 (×4): qty 5000

## 2016-09-15 MED ORDER — THROMBIN 20000 UNITS EX SOLR
CUTANEOUS | Status: DC | PRN
  Administered 2016-09-15: 14:00:00 via TOPICAL

## 2016-09-15 MED ORDER — FENTANYL CITRATE (PF) 100 MCG/2ML IJ SOLN
25.0000 ug | INTRAMUSCULAR | Status: DC | PRN
  Administered 2016-09-15 (×2): 50 ug via INTRAVENOUS

## 2016-09-15 MED ORDER — LIDOCAINE 2% (20 MG/ML) 5 ML SYRINGE
INTRAMUSCULAR | Status: AC
  Filled 2016-09-15: qty 5

## 2016-09-15 MED ORDER — ALUM & MAG HYDROXIDE-SIMETH 200-200-20 MG/5ML PO SUSP: 30.0000 mL | Freq: Four times a day (QID) | ORAL | Status: DC | PRN

## 2016-09-15 MED ORDER — PROPOFOL 10 MG/ML IV BOLUS
INTRAVENOUS | Status: AC
  Filled 2016-09-15: qty 40

## 2016-09-15 MED ORDER — THROMBIN 20000 UNITS EX SOLR
CUTANEOUS | Status: AC
  Filled 2016-09-15: qty 20000

## 2016-09-15 MED ORDER — PHENOL 1.4 % MT LIQD: 1.0000 | OROMUCOSAL | Status: DC | PRN

## 2016-09-15 MED ORDER — METOPROLOL SUCCINATE ER 25 MG PO TB24
25.0000 mg | ORAL_TABLET | Freq: Every day | ORAL | Status: DC
  Administered 2016-09-16: 25 mg via ORAL
  Filled 2016-09-15: qty 1

## 2016-09-15 MED ORDER — HYDROCODONE-ACETAMINOPHEN 5-325 MG PO TABS: 1.0000 | ORAL_TABLET | ORAL | Status: DC | PRN

## 2016-09-15 MED ORDER — CICLOPIROX 8 % EX SOLN: Freq: Every day | CUTANEOUS | Status: DC

## 2016-09-15 MED ORDER — MONTELUKAST SODIUM 10 MG PO TABS
10.0000 mg | ORAL_TABLET | Freq: Every day | ORAL | Status: DC
  Administered 2016-09-17 – 2016-09-18 (×2): 10 mg via ORAL
  Filled 2016-09-15 (×3): qty 1

## 2016-09-15 MED ORDER — DOCUSATE SODIUM 100 MG PO CAPS
100.0000 mg | ORAL_CAPSULE | Freq: Two times a day (BID) | ORAL | Status: DC
  Administered 2016-09-15 – 2016-09-18 (×5): 100 mg via ORAL
  Filled 2016-09-15 (×6): qty 1

## 2016-09-15 MED ORDER — OXYCODONE HCL 5 MG PO TABS: 5.0000 mg | ORAL_TABLET | Freq: Once | ORAL | Status: DC | PRN

## 2016-09-15 MED ORDER — THROMBIN 5000 UNITS EX SOLR
OROMUCOSAL | Status: DC | PRN
  Administered 2016-09-15: 17:00:00 via TOPICAL

## 2016-09-15 MED ORDER — FLEET ENEMA 7-19 GM/118ML RE ENEM: 1.0000 | ENEMA | Freq: Once | RECTAL | Status: DC | PRN

## 2016-09-15 MED ORDER — ONDANSETRON HCL 4 MG/2ML IJ SOLN: 4.0000 mg | Freq: Once | INTRAMUSCULAR | Status: DC | PRN

## 2016-09-15 MED ORDER — MIDAZOLAM HCL 2 MG/2ML IJ SOLN
INTRAMUSCULAR | Status: DC | PRN
  Administered 2016-09-15 (×2): 2 mg via INTRAVENOUS

## 2016-09-15 MED ORDER — 0.9 % SODIUM CHLORIDE (POUR BTL) OPTIME
TOPICAL | Status: DC | PRN
  Administered 2016-09-15: 1000 mL

## 2016-09-15 MED ORDER — GLYCOPYRROLATE 0.2 MG/ML IJ SOLN
INTRAMUSCULAR | Status: DC | PRN
  Administered 2016-09-15: 0.2 mg via INTRAVENOUS

## 2016-09-15 MED ORDER — TRIAMCINOLONE ACETONIDE 0.5 % EX OINT
1.0000 "application " | TOPICAL_OINTMENT | Freq: Two times a day (BID) | CUTANEOUS | Status: DC
  Administered 2016-09-16: 1 via TOPICAL
  Filled 2016-09-15: qty 15

## 2016-09-15 MED ORDER — LIDOCAINE-EPINEPHRINE (PF) 2 %-1:200000 IJ SOLN
INTRAMUSCULAR | Status: DC | PRN
  Administered 2016-09-15: 5 mL via INTRADERMAL

## 2016-09-15 MED ORDER — BUPIVACAINE HCL (PF) 0.5 % IJ SOLN
INTRAMUSCULAR | Status: AC
  Filled 2016-09-15: qty 30

## 2016-09-15 MED ORDER — METHOCARBAMOL 1000 MG/10ML IJ SOLN
500.0000 mg | Freq: Four times a day (QID) | INTRAVENOUS | Status: DC | PRN
  Administered 2016-09-16: 500 mg via INTRAVENOUS
  Filled 2016-09-15 (×4): qty 5

## 2016-09-15 SURGICAL SUPPLY — 83 items
BANDAGE GAUZE 4  KLING STR (GAUZE/BANDAGES/DRESSINGS) ×3 IMPLANT
BASKET BONE COLLECTION (BASKET) IMPLANT
BIT DRILL PLIF MAS DISP 5.5MM (DRILL) ×2 IMPLANT
BLADE CLIPPER SURG (BLADE) IMPLANT
BLADE SURG 15 STRL LF DISP TIS (BLADE) ×2 IMPLANT
BLADE SURG 15 STRL SS (BLADE) ×1
BNDG GAUZE ELAST 4 BULKY (GAUZE/BANDAGES/DRESSINGS) ×3 IMPLANT
BUR MATCHSTICK NEURO 3.0 LAGG (BURR) ×3 IMPLANT
BUR PRECISION FLUTE 5.0 (BURR) ×6 IMPLANT
BUR ROUND FLUTED 5 RND (BURR) ×6 IMPLANT
CAGE PLIF 8X9X23-12 LUMBAR (Cage) ×12 IMPLANT
CANISTER SUCT 3000ML PPV (MISCELLANEOUS) ×3 IMPLANT
CAP RELINE MOD TULIP RMM (Cap) ×6 IMPLANT
CARTRIDGE OIL MAESTRO DRILL (MISCELLANEOUS) ×2 IMPLANT
CLIP NEUROVISION LG (CLIP) ×3 IMPLANT
CONT SPEC 4OZ CLIKSEAL STRL BL (MISCELLANEOUS) ×3 IMPLANT
CORDS BIPOLAR (ELECTRODE) ×3 IMPLANT
COVER BACK TABLE 24X17X13 BIG (DRAPES) IMPLANT
COVER BACK TABLE 60X90IN (DRAPES) ×3 IMPLANT
DECANTER SPIKE VIAL GLASS SM (MISCELLANEOUS) ×3 IMPLANT
DERMABOND ADVANCED (GAUZE/BANDAGES/DRESSINGS) ×1
DERMABOND ADVANCED .7 DNX12 (GAUZE/BANDAGES/DRESSINGS) ×2 IMPLANT
DIFFUSER DRILL AIR PNEUMATIC (MISCELLANEOUS) ×3 IMPLANT
DRAPE C-ARM 42X72 X-RAY (DRAPES) ×3 IMPLANT
DRAPE C-ARMOR (DRAPES) ×3 IMPLANT
DRAPE EXTREMITY T 121X128X90 (DRAPE) ×3 IMPLANT
DRAPE HALF SHEET 40X57 (DRAPES) ×3 IMPLANT
DRAPE LAPAROTOMY 100X72X124 (DRAPES) ×3 IMPLANT
DRAPE POUCH INSTRU U-SHP 10X18 (DRAPES) ×3 IMPLANT
DRAPE SURG 17X23 STRL (DRAPES) ×3 IMPLANT
DRILL PLIF MAS DISP 5.5MM (DRILL) ×3
DRSG EMULSION OIL 3X3 NADH (GAUZE/BANDAGES/DRESSINGS) ×3 IMPLANT
DRSG OPSITE POSTOP 4X6 (GAUZE/BANDAGES/DRESSINGS) ×3 IMPLANT
DURAPREP 26ML APPLICATOR (WOUND CARE) ×3 IMPLANT
ELECT REM PT RETURN 9FT ADLT (ELECTROSURGICAL) ×3
ELECTRODE REM PT RTRN 9FT ADLT (ELECTROSURGICAL) ×2 IMPLANT
EVACUATOR 1/8 PVC DRAIN (DRAIN) IMPLANT
GAUZE SPONGE 4X4 12PLY STRL (GAUZE/BANDAGES/DRESSINGS) ×3 IMPLANT
GAUZE SPONGE 4X4 16PLY XRAY LF (GAUZE/BANDAGES/DRESSINGS) ×3 IMPLANT
GLOVE BIO SURGEON STRL SZ8 (GLOVE) ×9 IMPLANT
GLOVE BIOGEL PI IND STRL 8 (GLOVE) ×4 IMPLANT
GLOVE BIOGEL PI IND STRL 8.5 (GLOVE) ×6 IMPLANT
GLOVE BIOGEL PI INDICATOR 8 (GLOVE) ×2
GLOVE BIOGEL PI INDICATOR 8.5 (GLOVE) ×3
GLOVE ECLIPSE 8.0 STRL XLNG CF (GLOVE) ×6 IMPLANT
GOWN STRL REUS W/ TWL LRG LVL3 (GOWN DISPOSABLE) ×2 IMPLANT
GOWN STRL REUS W/ TWL XL LVL3 (GOWN DISPOSABLE) ×8 IMPLANT
GOWN STRL REUS W/TWL 2XL LVL3 (GOWN DISPOSABLE) ×3 IMPLANT
GOWN STRL REUS W/TWL LRG LVL3 (GOWN DISPOSABLE) ×1
GOWN STRL REUS W/TWL XL LVL3 (GOWN DISPOSABLE) ×4
HEMOSTAT POWDER KIT SURGIFOAM (HEMOSTASIS) ×3 IMPLANT
KIT BASIN OR (CUSTOM PROCEDURE TRAY) ×3 IMPLANT
KIT POSITION SURG JACKSON T1 (MISCELLANEOUS) ×3 IMPLANT
KIT ROOM TURNOVER OR (KITS) ×6 IMPLANT
MODULE NVM5 NEXT GEN EMG (NEEDLE) ×3 IMPLANT
NEEDLE HYPO 25X1 1.5 SAFETY (NEEDLE) ×6 IMPLANT
NEEDLE SPNL 18GX3.5 QUINCKE PK (NEEDLE) IMPLANT
NS IRRIG 1000ML POUR BTL (IV SOLUTION) ×3 IMPLANT
OIL CARTRIDGE MAESTRO DRILL (MISCELLANEOUS) ×3
PACK LAMINECTOMY NEURO (CUSTOM PROCEDURE TRAY) ×3 IMPLANT
PAD ARMBOARD 7.5X6 YLW CONV (MISCELLANEOUS) ×18 IMPLANT
ROD RELINE COCR 5.0X60MM (Rod) ×6 IMPLANT
SCREW LOCK RSS 4.5/5.0MM (Screw) ×18 IMPLANT
SCREW POLY RMM 5.5X40 4S (Screw) ×12 IMPLANT
SHANK RELINE MOD 5.5X40 (Screw) ×6 IMPLANT
SPONGE GAUZE 4X4 12PLY STER LF (GAUZE/BANDAGES/DRESSINGS) ×3 IMPLANT
SPONGE LAP 4X18 X RAY DECT (DISPOSABLE) IMPLANT
SPONGE SURGIFOAM ABS GEL 100 (HEMOSTASIS) ×3 IMPLANT
STAPLER SKIN PROX WIDE 3.9 (STAPLE) IMPLANT
STOCKINETTE 4X48 STRL (DRAPES) ×3 IMPLANT
SUT ETHILON 3 0 PS 1 (SUTURE) ×3 IMPLANT
SUT VIC AB 1 CT1 18XBRD ANBCTR (SUTURE) ×4 IMPLANT
SUT VIC AB 1 CT1 8-18 (SUTURE) ×2
SUT VIC AB 2-0 CT1 18 (SUTURE) ×6 IMPLANT
SUT VIC AB 3-0 SH 8-18 (SUTURE) ×6 IMPLANT
SYR 3ML LL SCALE MARK (SYRINGE) ×12 IMPLANT
SYR 5ML LL (SYRINGE) IMPLANT
TAPE CLOTH 1X10 TAN NS (GAUZE/BANDAGES/DRESSINGS) ×3 IMPLANT
TOWEL OR 17X24 6PK STRL BLUE (TOWEL DISPOSABLE) ×3 IMPLANT
TOWEL OR 17X26 10 PK STRL BLUE (TOWEL DISPOSABLE) ×3 IMPLANT
TRAY FOLEY W/METER SILVER 16FR (SET/KITS/TRAYS/PACK) ×3 IMPLANT
UNDERPAD 30X30 (UNDERPADS AND DIAPERS) ×3 IMPLANT
WATER STERILE IRR 1000ML POUR (IV SOLUTION) ×3 IMPLANT

## 2016-09-15 NOTE — Progress Notes (Signed)
Awake, sleepy, but arousable.  MAEW with good power.  Doing well.

## 2016-09-15 NOTE — H&P (Signed)
Patient ID:   [id number] Patient: [name] [name]  Date of Birth: August 10, 1956 Visit Type: Office Visit   Date: 07/06/2016 10:00 AM Provider: Marchia Meiers. Vertell Limber MD   This 61 year old male presents for back pain.  History of Present Illness: 1.  back pain    Patient returns to review his MRI and EMG/NCS.  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  Cervical MRI shows facet arthropathy left greater than right at the C 45 and C 56 levels.  Lumbar imaging shows severe stenosis, disc herniations, facet overgrowth and foraminal stenosis L 34 and L 45 levels with severe nerve root encroachment and right sided disc herniation  with migrated fragment.  I reviewed options with patient.  He wishes to have both CTS treated as well as his low back.  He will hold off on his neck for the time being.      Medical/Surgical/Interim History Reviewed, no change.  Last detailed document date:06/08/2016.   PAST MEDICAL HISTORY, SURGICAL HISTORY, FAMILY HISTORY, SOCIAL HISTORY AND REVIEW OF SYSTEMS I have reviewed the patient's past medical, surgical, family and social history as well as the comprehensive review of systems as included on the [facility] history form dated 06/23/2016, which I have signed.  Family History: Reviewed, no changes.  Last detailed document: 06/08/2016.   Social History: Tobacco use reviewed. Reviewed, no changes. Last detailed document date: 06/08/2016.      MEDICATIONS(added, continued or stopped this visit): Started Medication Directions Instruction Stopped   AndroGel 20.25 mg/1.25 gram (1.62 %) transdermal gel pump apply (20.25MG )  by topical route  every day in the morning to each upper arm and shoulder for a total dose of 40.5mg     05/27/2016 aspirin 81 mg tablet,delayed release take 1 tablet by oral route  every day     Celebrex 200 mg capsule take 1 capsule by oral route 2 times every day as needed      Cialis 5 mg tablet take 1 tablet by oral route  every day     Dymista 137 mcg-50 mcg/spray nasal spray spray 1 spray by intranasal route 2 times every day in each nostril     Flexeril 10mg  ORAL      hydrocodone 5 mg-acetaminophen 325 mg tablet take 1 tablet by oral route  every 6 hours as needed for pain     meloxicam 15 mg tablet take 1 tablet by oral route  every day     Mobic 15 mg tablet take 1 tablet by oral route  every day     Penlac 8 % topical solution apply by topical route  every day to the affected area(s) preferably at bedtime or 8 hours before washing     Protonix 40 mg tablet,delayed release take 1 tablet by oral route  every day     Ritalin 10 mg tablet take 1 tablet by oral route 2 times every day     Singulair 10 mg tablet take 1 tablet by oral route  every day in the evening     Topicaine 5 % topical gel      Toprol XL 25 mg tablet,extended release take 1 tablet by oral route  every day       ALLERGIES: Ingredient Reaction Medication Name Comment  PROPENTOFYLLINE         Vitals Date Temp F BP Pulse Ht In Wt Lb BMI BSA Pain Score  07/06/2016  144/82 74  70 215 30.85  7/10      IMPRESSION Patient wishes to proceed with L CTR on expedited basis and then follow with Right CTR and lumbar decompression and fusion at the L 34 and L 45 levels by MAS PLIF technique.  He was fitted for LSO brace and bilateral wrist splints.  Completed Orders (this encounter) Order Details Reason Side Interpretation Result Initial Treatment Date Region  Lifestyle education follow up with primary physcian for elevated blood pressure.        Giving encouragement to exercise Encourage patient to eat a well balanced diet and follow up with primary physcian.         Assessment/Plan # Detail Type Description   1. Assessment Low back pain, unspecified back pain laterality, with sciatica presence unspecified (M54.5).       2. Assessment Disc displacement, lumbar (M51.26).       3.  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       4. 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   Plan Orders Aspen Lo Sag Rigid Panel Quick.       5. Assessment Radiculopathy, lumbar region (M54.16).       6. Assessment Carpal tunnel syndrome on both sides (G56.03).   Plan Orders  Wrist Splint-Left ([id number]) and Wrist Splint-Right [id number]).       7. Assessment Essential (primary) hypertension (I10).       8. Assessment Body mass index (BMI) 30.0-30.9, adult (Z68.30).   Plan Orders Today's instructions / counseling include(s) Giving encouragement to exercise.         Pain Assessment/Treatment Pain Scale: 7/10. Method: Numeric Pain Intensity Scale. Location: back. Onset: 08/06/2015. Duration: varies. Quality: discomforting. Pain Assessment/Treatment follow-up plan of care: Pateint taking medication as prescribed..  Risks and benefits explained to patient.  Nurse education performed.  Patient wishes to proceed with surgery ASAP.  Orders: Diagnostic Procedures: Assessment Procedure  M54.16 Lumbar Spine- AP/Lat  Instruction(s)/Education: Assessment Instruction  I10 Lifestyle education  Z68.30 Giving encouragement to exercise  Miscellaneous: Assessment   G56.03 Wrist Splint-Left ([id number])  G56.03 Wrist Splint-Right [id number])  M48.061 Aspen Lo Sag Rigid Panel Quick             Provider:  Marchia Meiers. Vertell Limber MD  07/10/2016 04:51 PM Dictation edited by: Marchia Meiers. [facility]    CC Providers: [facility] [address] [place],  [address]-              Electronically signed by Marchia Meiers Vertell Limber MD on 07/10/2016 04:51 PM  Patient ID:   [id number] Patient: [name] [name]  Date of Birth: 12-09-1955 Visit Type: Office Visit   Date: 06/08/2016 03:30 PM Provider: Marchia Meiers. Vertell Limber MD   This 61 year old male presents for back pain.  History of Present Illness: 1.  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  Pt experienced similar pain in the left hip 12 years ago.,  With right hip symptoms increasing since 2005.  Patient awoke with both hands and wrists swollen and painful six weeks ago.   ESI 2 cervical one year ago offered no relief  Mobic 15 mg one per day Norco 5/325 1-2/day Flexeril 10 mg one every two weeks at bedtime  History: Sleep apnea, HTN Surgical history: Appendectomy 1978, ganglion cyst right wrist and right  ankle 1990; right inguinal hernia repair 1991; right shoulder surgeries 1995, 2003, 2014, 2015; left shoulder surgeries 2005 2013  2016 Cervical MRI on [facility] reveals C5-6 disc protrusion and bone spurs with no significant spinal cord compression        PAST MEDICAL/SURGICAL HISTORY   (Detailed)  Disease/disorder Onset Date Management Date Comments  Anxiety      Arthritis    LRH 06/08/2016 -  Depression      GERD      Hypertension         PAST MEDICAL HISTORY, SURGICAL HISTORY, FAMILY HISTORY, SOCIAL HISTORY AND REVIEW OF SYSTEMS  06/08/2016, which I have signed.  Family History  (Detailed) Relationship Family Member Name Deceased Age at Death Condition Onset Age Cause of Death  Father    Cancer, lung  N  Mother    Hypertension  N    SOCIAL HISTORY  (Detailed) Tobacco use reviewed. Preferred language is Unknown.   Smoking status: Never smoker.  SMOKING STATUS Use Status Type Smoking Status Usage Per Day Years Used Total Pack Years  no/never  Never smoker             MEDICATIONS(added, continued or stopped this visit): Started Medication Directions Instruction Stopped   AndroGel 20.25 mg/1.25 gram (1.62 %) transdermal gel pump apply (20.25MG )  by topical route  every day in the morning to each upper arm and shoulder for a total dose of 40.5mg      05/27/2016 aspirin 81 mg tablet,delayed release take 1 tablet by oral route  every day     Celebrex 200 mg capsule take 1 capsule by oral route 2 times every day as needed     Cialis 5 mg tablet take 1 tablet by oral route  every day     Dymista 137 mcg-50 mcg/spray nasal spray spray 1 spray by intranasal route 2 times every day in each nostril     Flexeril 10mg  ORAL      hydrocodone 5 mg-acetaminophen 325 mg tablet take 1 tablet by oral route  every 6 hours as needed for pain     meloxicam 15 mg tablet take 1 tablet by oral route  every day     Mobic 15 mg tablet take 1 tablet by oral route  every day     Penlac 8 % topical solution apply by topical route  every day to the affected area(s) preferably at bedtime or 8 hours before washing     Protonix 40 mg tablet,delayed release take 1 tablet by oral route  every day     Ritalin 10 mg tablet take 1 tablet by oral route 2 times every day     Singulair 10 mg tablet take 1 tablet by oral route  every day in the evening     Topicaine 5 % topical gel      Toprol XL 25 mg tablet,extended release take 1 tablet by oral route  every day       ALLERGIES: Ingredient Reaction Medication Name Comment  PROPENTOFYLLINE      Reviewed, updated.    Vitals Date Temp F BP Pulse Ht In Wt Lb BMI BSA Pain Score  06/08/2016  154/82 77 70 216 30.99  6/10     PHYSICAL EXAM General Level of Distress: no acute distress Overall Appearance: normal    Cardiovascular Cardiac: regular rate and rhythm without murmur  Respiratory Lungs: clear to auscultation  Neurological Recent and Remote Memory: normal Attention Span and Concentration:  normal Language: normal Fund of Knowledge: normal  Right Left Sensation: normal normal Upper Extremity Coordination: normal normal  Lower Extremity Coordination: normal normal  Musculoskeletal Gait and Station: normal  Right Left Upper Extremity Muscle Strength: normal normal Lower Extremity Muscle  Strength: normal normal Upper Extremity Muscle Tone:  normal normal Lower Extremity Muscle Tone: normal normal  Motor Strength Upper and lower extremity motor strength was tested in the clinically pertinent muscles.     Deep Tendon Reflexes  Right Left Biceps: normal normal Triceps: normal normal Brachiloradialis: normal normal Patellar: normal normal Achilles: normal normal  Sensory Sensation was tested at L1 to S1.   Cranial Nerves II. Optic Nerve/Visual Fields: normal III. Oculomotor: normal IV. Trochlear: normal V. Trigeminal: normal VI. Abducens: normal VII. Facial: normal VIII. Acoustic/Vestibular: normal IX. Glossopharyngeal: normal X. Vagus: normal XI. Spinal Accessory: normal XII. Hypoglossal: normal  Motor and other Tests Lhermittes: negative Rhomberg: negative    Right Left Spurlings positive positive Hoffman's: normal normal Clonus: normal normal Babinski: normal normal SLR: negative negative Patrick's Corky Sox): negative negative Toe Walk: normal normal Toe Lift: normal normal Heel Walk: normal normal Tinels Elbow: negative negative Tinels Wrist: positive positive SI Joint: nontender nontender Phalen: positive positive   Additional Findings:  UE and LE strength is normal, sciatic notch discomfort bilaterally, toe touch to the floor, symmetric reflexes, decreased pin prick sensation in left hand   DIAGNOSTIC RESULTS 2016 Cervical MRI on [facility] reveals C5-6 disc protrusion and bone spurs with no significant spinal cord compression    IMPRESSION The patient is experiencing right hip/thigh pain, intermittent bilateral buttock pain, and pain/numbness in both hands. On review of his cervical MRI, he has a disc protrusion and bone spurs at C5-6 that is not causing significant cord compression. On confrontational testing, he has positive Spurlings bilaterally, positive Tinels and Phalens bilaterally, decreased pin prick sensation in his left hand,  and sciatic notch discomfort bilaterally. Due to his positive Tinels and Phalens, I believe he may have carpal tunnel syndrome in both hands, so I will order a bilateral upper extremity EMG for further workup. I would also like to get an updated cervical MRI and a lumbar MRI for further workup of his UE and LE symptoms.   Ordered cervical and lumbar MRIs as well as a bilateral upper extremity EMG. Follow up after to discuss.              Provider:  Marchia Meiers. Vertell Limber MD  06/08/2016 04:52 PM Dictation edited by: Johnella Moloney    CC Providers: [facility] [address] [place], [place] [address]-              Electronically signed by Marchia Meiers. Vertell Limber MD on 06/09/2016 05:00 PM

## 2016-09-15 NOTE — Anesthesia Preprocedure Evaluation (Signed)
Anesthesia Evaluation  Patient identified by MRN, date of birth, ID band Patient awake    Reviewed: Allergy & Precautions, NPO status , Patient's Chart, lab work & pertinent test results  Airway Mallampati: II  TM Distance: >3 FB Neck ROM: Full    Dental  (+) Teeth Intact, Dental Advisory Given   Pulmonary    breath sounds clear to auscultation       Cardiovascular hypertension,  Rhythm:Regular Rate:Normal     Neuro/Psych    GI/Hepatic   Endo/Other    Renal/GU      Musculoskeletal   Abdominal   Peds  Hematology   Anesthesia Other Findings   Reproductive/Obstetrics                             Anesthesia Physical Anesthesia Plan  ASA: II  Anesthesia Plan: General   Post-op Pain Management:    Induction: Intravenous  Airway Management Planned: Oral ETT  Additional Equipment:   Intra-op Plan:   Post-operative Plan: Extubation in OR  Informed Consent: I have reviewed the patients History and Physical, chart, labs and discussed the procedure including the risks, benefits and alternatives for the proposed anesthesia with the patient or authorized representative who has indicated his/her understanding and acceptance.   Dental advisory given  Plan Discussed with: Anesthesiologist and CRNA  Anesthesia Plan Comments:         Anesthesia Quick Evaluation

## 2016-09-15 NOTE — Progress Notes (Signed)
Anesthesiology Note:  61 year old male with lumbar spinal stenosis, hypertension on metoprolol, sleep apnea, and bipolar depression who is undergoing lumbar decompression and fusion and carpal tunnel release by Dr. Vertell Limber. Anesthetic induction and intubation uneventful. Anesthesia maintained with precedex infusion, remifentanyl infusion, and sevoflurane. Approximately 80 minutes after induction, he developed the sudden onset of a narrow complex nodal rhythm with HR 40-45. BP decreased from 105/70 to 65/40. Patient given neosynephrine, ephedrine and glycopyrrolate without significant change in rhythm or BP. Patient then given 2 mg of atropine in divided doses with resumption of sinus rhythm (HR-80) and recovery of BP to 110/70. Surgery still ongoing for over two hours and patient has maintained SR and stable BP. Patient has no h/o of heart disease with negative stress echo 10/01/13. Pre-op ECG normal.  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   Plan: 1. Post-op ECG 2. Post-op telemetry monitoring 3. [place] cardiology evaluation for assessment of med regimen and need for further follow-up.  Roberts Gaudy

## 2016-09-15 NOTE — Transfer of Care (Signed)
Immediate Anesthesia Transfer of Care Note  Patient: [name] [name]  Procedure(s) Performed: Procedure(s) with comments: LUMBAR THREE-FOUR, LUMBAR FOUR-FIVE  MAXIMUM ACCESS POSTERIOR LUMBAR INTERBODY FUSION (N/A) - L3-4 L4-5 MAXIMUM ACCESS POSTERIOR LUMBAR INTERBODY FUSION CARPAL TUNNEL RELEASE-RIGHT (Right) - CARPAL TUNNEL RELEASE-RIGHT  Patient Location: PACU  Anesthesia Type:General  Level of Consciousness: awake, alert  and oriented  Airway & Oxygen Therapy: Patient Spontanous Breathing and Patient connected to nasal cannula oxygen  Post-op Assessment: Report given to RN, Post -op Vital signs reviewed and stable and Patient moving all extremities  Post vital signs: Reviewed and stable  Last Vitals:  Vitals:   09/15/16 1222  BP: 117/69  Pulse: 66  Resp: 18  Temp: 36.8 C    Last Pain:  Vitals:   09/15/16 1222  TempSrc: Oral  PainSc:       Patients Stated Pain Goal: 6 ([phone] [id number])  Complications: No apparent anesthesia complications

## 2016-09-15 NOTE — Brief Op Note (Signed)
09/15/2016  7:12 PM  PATIENT:  [name] [name]  61 y.o. male  PRE-OPERATIVE DIAGNOSIS:  CARPAL TUNNEL SYNDROME, SPINAL STENOSIS OF LUMBAR REGION WITH RADICULOPATHY, herniated lumbar disc, lumbago, radiculopathy L 34 and L 45 levels  POST-OPERATIVE DIAGNOSIS: CARPAL TUNNEL SYNDROME, SPINAL STENOSIS OF LUMBAR REGION WITH RADICULOPATHY, herniated lumbar disc, lumbago, radiculopathy L 34 and L 45 levels  PROCEDURE:  Procedure(s) with comments: LUMBAR THREE-FOUR, LUMBAR FOUR-FIVE  MAXIMUM ACCESS POSTERIOR LUMBAR INTERBODY FUSION (N/A) - L3-4 L4-5 MAXIMUM ACCESS POSTERIOR LUMBAR INTERBODY FUSION vwith posterolateral arthrodesis with local autograft CARPAL TUNNEL RELEASE-RIGHT (Right) - CARPAL TUNNEL RELEASE-RIGHT  SURGEON:  Surgeon(s) and Role:    * Erline Levine, MD - Primary  PHYSICIAN ASSISTANT:   ASSISTANTS: Poteat, RN   ANESTHESIA:   general  EBL:  No intake/output data recorded.  BLOOD ADMINISTERED:none  DRAINS: none   LOCAL MEDICATIONS USED:  MARCAINE    and LIDOCAINE   SPECIMEN:  No Specimen  DISPOSITION OF SPECIMEN:  N/A  COUNTS:  YES  TOURNIQUET:  * No tourniquets in log *  DICTATION: DICTATION: Patient is a 61 year old with spondylosis , stenosis, disc herniation and severe back and bilateral lower extremity pain at L4/5 and L 34 levels of the lumbar spine.It was elected to take him to surgery for MASPLIF at  L 45 and L 34 levels with posterolateral arthrodesis.  He also has right carpal tunnel syndrome and it was elected to perform right CTR at the same surgery.  Procedure:   Following uncomplicated induction of GETA, and placement of electrodes for neural monitoring, patient was turned into a prone position on the [place] tableand using AP  fluoroscopy the area of planned incision was marked, prepped with betadine scrub and Duraprep, then draped. Exposure was performed of facet joint complex at L 45 and L 34 levels and the MAS retractor was placed.5.5 x 40 mm cortical  Nuvasive screws were placed at L 3 bilaterally according to standard landmarks using neural monitoring.  A total laminectomy of L 3 and L 4 was then performed with disarticulation of facets.  Decompression was greater than for standard PLIF procedure and thorough decompression of the thecal sac, bilateral L 3, L 4, L 5 nerve roots was performed. Along with foraminal and extraforaminal portions of these nerve roots.  This bone was saved for grafting, combined with local autograft after being run through bone mill and was placed in bone packing device.  Thorough discectomy was performed bilaterally at L 45  and the endplates were prepared for grafting.  23 x 8 x 9 12degree cages were placed in the interspace and positioning was confirmed with AP and lateral fluoroscopy.  10 cc of autograft was packed in the interspace medial to the second cage.   Thorough discectomy was performed bilaterally at L 434  and the endplates were prepared for grafting.  23 x 8 x 9 x 12 degree cages were placed in the interspace and positioning was confirmed with AP and lateral fluoroscopy.  10 cc of autograft was packed in the interspace medial to the second cage.   Remaining screws were placed at L 4 and and L 5 and 60 mm rods were placed. And the screws were locked and torqued.Final Xrays showed well positioned implants and screw fixation. The posterolateral region on the left was packed with remaining 30 cc of autograft. The wounds were irrigated and then closed with 1, 2-0 and 3-0 Vicryl stitches. 20 cc long-acting Marcaine was injected into the musculature.  Sterile occlusive dressing was placed with Dermabond and an occlusive dressing.    Patient was then repositioned supine with his right arm on the arm board.  His hand and arm were prepped and draped with betadine scrub and Duraprep and sterile stockinet.  Right wrist was infiltrated with lidocaine and an incision was made over a length of 2 cm in line with the fourth ray.  The  flexor retinaculum was incised and the carpal tunnel was decompressed.  Decompression was carried into the volar wrist and into the distal palm.  Hemostasis was assured. The wound was irrigated and closed with 3-0 nylon vertical mattress stitches and dressed with a sterile occlusive dressing with Adaptic, fluff gauze, Kerlix and cling wrap.   Patient was taken to recovery in stable and satisfactory condition. Counts were correct at the end of the case.The patient was then extubated in the operating room and taken to recovery in stable and satisfactory condition having tolerated his operation well. Counts were correct at the end of the case.  PLAN OF CARE: Admit to inpatient   PATIENT DISPOSITION:  PACU - hemodynamically stable.   Delay start of Pharmacological VTE agent (>24hrs) due to surgical blood loss or risk of bleeding: yes

## 2016-09-15 NOTE — Anesthesia Procedure Notes (Signed)
Procedure Name: Intubation Date/Time: 09/15/2016 2:32 PM Performed by: Trixie Deis A Pre-anesthesia Checklist: Patient identified, Emergency Drugs available, Suction available and Patient being monitored Patient Re-evaluated:Patient Re-evaluated prior to inductionOxygen Delivery Method: Circle System Utilized Preoxygenation: Pre-oxygenation with 100% oxygen Intubation Type: IV induction Ventilation: Mask ventilation without difficulty and Oral airway inserted - appropriate to patient size Laryngoscope Size: Mac and 3 Grade View: Grade I Tube type: Oral Tube size: 8.0 mm Number of attempts: 1 Airway Equipment and Method: Stylet and Oral airway Placement Confirmation: ETT inserted through vocal cords under direct vision,  positive ETCO2 and breath sounds checked- equal and bilateral Secured at: 22 cm Tube secured with: Tape Dental Injury: Teeth and Oropharynx as per pre-operative assessment

## 2016-09-15 NOTE — Interval H&P Note (Signed)
History and Physical Interval Note:  09/15/2016 2:27 PM  [name] [name]  has presented today for surgery, with the diagnosis of CARPAL TUNNEL SYNDROME, SPINAL STENOSIS OF LUMBAR REGION WITH RADICULOPATHY  The various methods of treatment have been discussed with the patient and family. After consideration of risks, benefits and other options for treatment, the patient has consented to  Procedure(s) with comments: L3-4 L4-5 MAXIMUM ACCESS POSTERIOR LUMBAR INTERBODY FUSION (N/A) - L3-4 L4-5 MAXIMUM ACCESS POSTERIOR LUMBAR INTERBODY FUSION CARPAL TUNNEL RELEASE-RIGHT (Right) - CARPAL TUNNEL RELEASE-RIGHT as a surgical intervention .  The patient's history has been reviewed, patient examined, no change in status, stable for surgery.  I have reviewed the patient's chart and labs.  Questions were answered to the patient's satisfaction.     Ebb Carelock D

## 2016-09-15 NOTE — Telephone Encounter (Signed)
Printed script, MD signed, notified pt rx ready for pick-up.../lmb 

## 2016-09-15 NOTE — Op Note (Signed)
09/15/2016  7:12 PM  PATIENT:  [name] [name]  61 y.o. male  PRE-OPERATIVE DIAGNOSIS:  CARPAL TUNNEL SYNDROME, SPINAL STENOSIS OF LUMBAR REGION WITH RADICULOPATHY, herniated lumbar disc, lumbago, radiculopathy L 34 and L 45 levels  POST-OPERATIVE DIAGNOSIS: CARPAL TUNNEL SYNDROME, SPINAL STENOSIS OF LUMBAR REGION WITH RADICULOPATHY, herniated lumbar disc, lumbago, radiculopathy L 34 and L 45 levels  PROCEDURE:  Procedure(s) with comments: LUMBAR THREE-FOUR, LUMBAR FOUR-FIVE  MAXIMUM ACCESS POSTERIOR LUMBAR INTERBODY FUSION (N/A) - L3-4 L4-5 MAXIMUM ACCESS POSTERIOR LUMBAR INTERBODY FUSION vwith posterolateral arthrodesis with local autograft CARPAL TUNNEL RELEASE-RIGHT (Right) - CARPAL TUNNEL RELEASE-RIGHT  SURGEON:  Surgeon(s) and Role:    * Erline Levine, MD - Primary  PHYSICIAN ASSISTANT:   ASSISTANTS: [place], RN   ANESTHESIA:   general  EBL:  No intake/output data recorded.  BLOOD ADMINISTERED:none  DRAINS: none   LOCAL MEDICATIONS USED:  MARCAINE    and LIDOCAINE   SPECIMEN:  No Specimen  DISPOSITION OF SPECIMEN:  N/A  COUNTS:  YES  TOURNIQUET:  * No tourniquets in log *  DICTATION: DICTATION: Patient is a 61 year old with spondylosis , stenosis, disc herniation and severe back and bilateral lower extremity pain at L4/5 and L 34 levels of the lumbar spine.It was elected to take him to surgery for MASPLIF at  L 45 and L 34 levels with posterolateral arthrodesis.  He also has right carpal tunnel syndrome and it was elected to perform right CTR at the same surgery.  Procedure:   Following uncomplicated induction of GETA, and placement of electrodes for neural monitoring, patient was turned into a prone position on the [place] tableand using AP  fluoroscopy the area of planned incision was marked, prepped with betadine scrub and Duraprep, then draped. Exposure was performed of facet joint complex at L 45 and L 34 levels and the MAS retractor was placed.5.5 x 40 mm cortical  Nuvasive screws were placed at L 3 bilaterally according to standard landmarks using neural monitoring.  A total laminectomy of L 3 and L 4 was then performed with disarticulation of facets.  Decompression was greater than for standard PLIF procedure and thorough decompression of the thecal sac, bilateral L 3, L 4, L 5 nerve roots was performed. Along with foraminal and extraforaminal portions of these nerve roots.  This bone was saved for grafting, combined with local autograft after being run through bone mill and was placed in bone packing device.  Thorough discectomy was performed bilaterally at L 45  and the endplates were prepared for grafting.  23 x 8 x 9 12degree cages were placed in the interspace and positioning was confirmed with AP and lateral fluoroscopy.  10 cc of autograft was packed in the interspace medial to the second cage.   Thorough discectomy was performed bilaterally at L 434  and the endplates were prepared for grafting.  23 x 8 x 9 x 12 degree cages were placed in the interspace and positioning was confirmed with AP and lateral fluoroscopy.  10 cc of autograft was packed in the interspace medial to the second cage.   Remaining screws were placed at L 4 and and L 5 and 60 mm rods were placed. And the screws were locked and torqued.Final Xrays showed well positioned implants and screw fixation. The posterolateral region on the left was packed with remaining 30 cc of autograft. The wounds were irrigated and then closed with 1, 2-0 and 3-0 Vicryl stitches. 20 cc long-acting Marcaine was injected into the musculature.  Sterile occlusive dressing was placed with Dermabond and an occlusive dressing.    Patient was then repositioned supine with his right arm on the arm board.  His hand and arm were prepped and draped with betadine scrub and Duraprep and sterile stockinet.  Right wrist was infiltrated with lidocaine and an incision was made over a length of 2 cm in line with the fourth ray.  The  flexor retinaculum was incised and the carpal tunnel was decompressed.  Decompression was carried into the volar wrist and into the distal palm.  Hemostasis was assured. The wound was irrigated and closed with 3-0 nylon vertical mattress stitches and dressed with a sterile occlusive dressing with Adaptic, fluff gauze, Kerlix and cling wrap.   Patient was taken to recovery in stable and satisfactory condition. Counts were correct at the end of the case.The patient was then extubated in the operating room and taken to recovery in stable and satisfactory condition having tolerated his operation well. Counts were correct at the end of the case.  PLAN OF CARE: Admit to inpatient   PATIENT DISPOSITION:  PACU - hemodynamically stable.   Delay start of Pharmacological VTE agent (>24hrs) due to surgical blood loss or risk of bleeding: yes

## 2016-09-16 ENCOUNTER — Encounter (HOSPITAL_COMMUNITY): Payer: Self-pay | Admitting: Internal Medicine

## 2016-09-16 DIAGNOSIS — M48062 Spinal stenosis, lumbar region with neurogenic claudication: Principal | ICD-10-CM

## 2016-09-16 DIAGNOSIS — Z419 Encounter for procedure for purposes other than remedying health state, unspecified: Secondary | ICD-10-CM

## 2016-09-16 DIAGNOSIS — R001 Bradycardia, unspecified: Secondary | ICD-10-CM

## 2016-09-16 LAB — GLUCOSE, CAPILLARY
GLUCOSE-CAPILLARY: 106 mg/dL — AB (ref 65–99)
GLUCOSE-CAPILLARY: 95 mg/dL (ref 65–99)
Glucose-Capillary: 147 mg/dL — ABNORMAL HIGH (ref 65–99)
Glucose-Capillary: 103 mg/dL — ABNORMAL HIGH (ref 65–99)

## 2016-09-16 LAB — TROPONIN I: Troponin I: <0.03 ng/mL (ref <0.03)

## 2016-09-16 MED FILL — Heparin Sodium (Porcine) Inj 1000 Unit/ML: INTRAMUSCULAR | Qty: 30 | Status: AC

## 2016-09-16 MED FILL — Sodium Chloride IV Soln 0.9%: INTRAVENOUS | Qty: 1000 | Status: AC

## 2016-09-16 NOTE — Progress Notes (Signed)
Anesthesiology Follow-up:  POD #1 following lumbar fusion complicated by severe bradycardia with nodal rhythm and hypotension responsive to atropine. Feels well other than incisional low back pain. Troponin (-), has maintained SR . Echo pending. Toprol XL discontinued. Stable post-op course.  Roberts Gaudy

## 2016-09-16 NOTE — Consult Note (Signed)
CONSULTATION NOTE  Reason for Consult: Bradycardia  Requesting Physician: Dr. Vertell Limber  Cardiologist: None (NEW)  HPI: This is a 61 y.o. male with a past medical history significant for hypertension, chronic back pain, bipolar depression and OSA. She was admitted for elective lumbar spine surgery. During surgery, anesthesia noted that 80 minutes after induction, he developed a narrow complex rhythm with HR of 40-45. She was hypotensive and given neosynephrine, ephedrine and 2 mg atropine for this. HR went back to sinus with improvement in BP. History includes a negative stress echo in 09/2013. EKG from 1/18 and yesterday show some scooped inferior ST segment changes. He denies any chest pain - but says he has been dyspneic for "years".  PMHx:  Past Medical History:  Diagnosis Date  . ADD (attention deficit disorder)    takes Adderall and Vyvanse daily  . Anxiety   . Arthritis    shoulders  . Bipolar depression ([place])    takes Wellbutrin daily  . Carpal tunnel syndrome    right  . Chronic back pain    spinal stenosis  . Complication of anesthesia    proprofol caused rash  . Dyspnea    with exertion occasionally  . Enlarged prostate   . GERD (gastroesophageal reflux disease)    takes Pantoprazole daily  . History of bronchitis   . History of colon polyps    benign  . History of gastric ulcer 1985  . Hypertension    takes Metoprolol nightly  . Muscle spasm    takes Flexeril as needed  . Restless leg    occasionally  . Sciatic nerve pain   . Sleep apnea    uses CPAP nightly  . Weakness    numbness in right hand and legs    Past Surgical History:  Procedure Laterality Date  . APPENDECTOMY  1978  . COLONOSCOPY     multiple  . ESOPHAGOGASTRODUODENOSCOPY     multiple with dilitation  . GANGLION CYST EXCISION  1990   right elbow, left ankle - separate surgeries  . HERNIA REPAIR     inguinal  . LASIK    . left carpal tunnel   end of 2017  . left shoulder surgery   2013  . NASAL SEPTOPLASTY W/ TURBINOPLASTY  01/04/2007   inferior turb. reduction  . PROSTATE SURGERY  10/01/2008   transurethral incision prostate  . SHOULDER ARTHROSCOPY WITH SUBACROMIAL DECOMPRESSION Right 11/19/2013   Procedure: RIGHT SHOULDER ARTHROSCOPY WITH SUBACROMIAL DECOMPRESSION/DISTAL CLAVICAL RESECTION/REPAIR SUPRASPINATUS INFRASPINATUS;  Surgeon: Cammie Sickle., MD;  Location: [facility];  Service: Orthopedics;  Laterality: Right;  ANESTHESIA: GENERAL/REGIONAL BLOCK  Procedure SAF DCR Bicepts tenotomy Grade 3 slap Debridment  . SHOULDER SURGERY  08/21/2003 - left   1995 - right x 3  . TRANSURETHRAL RESECTION OF PROSTATE    . ULNAR NERVE REPAIR  03/10/2006   decompression left ulnar nerve  . VASECTOMY      FAMHx: Family History  Problem Relation Age of Onset  . Lung cancer Father   . Coronary artery disease Father   . Diabetes Other   . Asthma Other   . Coronary artery disease Other   . Coronary artery disease Paternal Grandfather     SOCHx:  reports that he has never smoked. He has never used smokeless tobacco. He reports that he drinks alcohol. He reports that he does not use drugs.  ALLERGIES: Allergies  Allergen Reactions  . Ciprofloxacin-Dexamethasone Rash    REDNESS  AND IRRITATION  . Propofol Rash    ROS: Pertinent items noted in HPI and remainder of comprehensive ROS otherwise negative.  HOME MEDICATIONS: No current facility-administered medications on file prior to encounter.    Current Outpatient Prescriptions on File Prior to Encounter  Medication Sig Dispense Refill  . amphetamine-dextroamphetamine (ADDERALL XR) 10 MG 24 hr capsule Take 10 mg by mouth daily as needed (for energy).     . cyclobenzaprine (FLEXERIL) 10 MG tablet Take 1 tablet (10 mg total) by mouth 3 (three) times daily as needed for muscle spasms. (Patient taking differently: Take 10 mg by mouth daily as needed for muscle spasms. ) 60 tablet 1  . DYMISTA 137-50  MCG/ACT SUSP Place 2 sprays into both nostrils at bedtime as needed. (Patient taking differently: Place 2 sprays into both nostrils at bedtime. ) 69 g 3  . lisdexamfetamine (VYVANSE) 60 MG capsule Take 1 capsule by mouth daily.    . metoprolol succinate (TOPROL XL) 25 MG 24 hr tablet Take 1 tablet (25 mg total) by mouth daily. (Patient taking differently: Take 25 mg by mouth every evening. ) 90 tablet 3  . tadalafil (CIALIS) 5 MG tablet Take 1 tablet (5 mg total) by mouth daily as needed for erectile dysfunction. (Patient taking differently: Take 5 mg by mouth daily. ) 90 tablet 3  . VIIBRYD 20 MG TABS Take 1 tablet by mouth daily.    Marland Kitchen aspirin (BAYER ASPIRIN) 325 MG tablet Take 1 tablet (325 mg total) by mouth daily. (Patient not taking: Reported on 08/25/2016) 100 tablet 3  . ciclopirox (PENLAC) 8 % solution Apply topically at bedtime. Apply over nail and surrounding skin. Apply daily over previous coat. After seven (7) days, may remove with alcohol and continue cycle. (Patient taking differently: Apply 1 application topically at bedtime as needed (toe fungus). Apply over nail and surrounding skin. Apply daily over previous coat. After seven (7) days, may remove with alcohol and continue cycle.) 6.6 mL 1  . montelukast (SINGULAIR) 10 MG tablet Take 1 tablet (10 mg total) by mouth daily. 30 tablet 11  . pantoprazole (PROTONIX) 40 MG tablet Take 1 tablet (40 mg total) by mouth daily before breakfast. 90 tablet 3  . triamcinolone ointment (KENALOG) 0.5 % Apply 1 application topically 2 (two) times daily. (Patient not taking: Reported on 05/05/2016) 45 g 3    HOSPITAL MEDICATIONS: Prior to Admission:  Prescriptions Prior to Admission  Medication Sig Dispense Refill Last Dose  . amphetamine-dextroamphetamine (ADDERALL XR) 10 MG 24 hr capsule Take 10 mg by mouth daily as needed (for energy).    Past Week at Unknown time  . aspirin EC 81 MG tablet Take 81 mg by mouth daily.   Past Month at Unknown time  .  BuPROPion HCl (WELLBUTRIN PO) Take 300 mg by mouth.    09/15/2016 at 1000  . cyclobenzaprine (FLEXERIL) 10 MG tablet Take 1 tablet (10 mg total) by mouth 3 (three) times daily as needed for muscle spasms. (Patient taking differently: Take 10 mg by mouth daily as needed for muscle spasms. ) 60 tablet 1 Past Month at Unknown time  . DYMISTA 137-50 MCG/ACT SUSP Place 2 sprays into both nostrils at bedtime as needed. (Patient taking differently: Place 2 sprays into both nostrils at bedtime. ) 69 g 3 09/14/2016 at Unknown time  . HYDROcodone-acetaminophen (NORCO/VICODIN) 5-325 MG tablet Take 1-2 tablets by mouth 2 (two) times daily as needed for moderate pain or severe pain. 90 tablet 0  09/15/2016 at 1030  . lisdexamfetamine (VYVANSE) 60 MG capsule Take 1 capsule by mouth daily.   09/14/2016 at Unknown time  . meloxicam (MOBIC) 15 MG tablet Take 1 tablet (15 mg total) by mouth daily. 90 tablet 1 Past Month at Unknown time  . metoprolol succinate (TOPROL XL) 25 MG 24 hr tablet Take 1 tablet (25 mg total) by mouth daily. (Patient taking differently: Take 25 mg by mouth every evening. ) 90 tablet 3 09/15/2016 at 1000  . oxyCODONE-acetaminophen (PERCOCET/ROXICET) 5-325 MG tablet Take 1-2 tablets by mouth 2 (two) times daily as needed for severe pain (depends on pain).     . tadalafil (CIALIS) 5 MG tablet Take 1 tablet (5 mg total) by mouth daily as needed for erectile dysfunction. (Patient taking differently: Take 5 mg by mouth daily. ) 90 tablet 3 09/15/2016 at 1000  . VIIBRYD 20 MG TABS Take 1 tablet by mouth daily.   Taking  . aspirin (BAYER ASPIRIN) 325 MG tablet Take 1 tablet (325 mg total) by mouth daily. (Patient not taking: Reported on 08/25/2016) 100 tablet 3 Not Taking at Unknown time  . ciclopirox (PENLAC) 8 % solution Apply topically at bedtime. Apply over nail and surrounding skin. Apply daily over previous coat. After seven (7) days, may remove with alcohol and continue cycle. (Patient taking differently: Apply  1 application topically at bedtime as needed (toe fungus). Apply over nail and surrounding skin. Apply daily over previous coat. After seven (7) days, may remove with alcohol and continue cycle.) 6.6 mL 1 More than a month at Unknown time  . meloxicam (MOBIC) 15 MG tablet Take 1 tablet (15 mg total) by mouth daily. (Patient not taking: Reported on 08/25/2016) 30 tablet 0 Not Taking at Unknown time  . montelukast (SINGULAIR) 10 MG tablet Take 1 tablet (10 mg total) by mouth daily. 30 tablet 11 Not Taking  . pantoprazole (PROTONIX) 40 MG tablet Take 1 tablet (40 mg total) by mouth daily before breakfast. 90 tablet 3 Unknown at Unknown time  . triamcinolone ointment (KENALOG) 0.5 % Apply 1 application topically 2 (two) times daily. (Patient not taking: Reported on 05/05/2016) 45 g 3 Not Taking at Unknown time    VITALS: Blood pressure 120/79, pulse 84, temperature 98.2 F (36.8 C), temperature source Oral, resp. rate 20, height 5\' 10"  (1.778 m), weight 217 lb 9.5 oz (98.7 kg), SpO2 97 %.  PHYSICAL EXAM: General appearance: alert, mild distress and some pain lying supine Neck: no carotid bruit and no JVD Lungs: clear to auscultation bilaterally Heart: regular rate and rhythm, S1, S2 normal, no murmur, click, rub or gallop Abdomen: soft, non-tender; bowel sounds normal; no masses,  no organomegaly Extremities: extremities normal, atraumatic, no cyanosis or edema Pulses: 2+ and symmetric Skin: Skin color, texture, turgor normal. No rashes or lesions Neurologic: Grossly normal Psych: Pleasant  LABS: No results found for this or any previous visit (from the past 48 hour(s)).  IMAGING: Dg Lumbar Spine 2-3 Views  Result Date: 09/15/2016 CLINICAL DATA:  L3-L5 PLIF. EXAM: LUMBAR SPINE - 2-3 VIEW; DG C-ARM 61-120 MIN COMPARISON:  06/18/2016 lumbar spine MRI FINDINGS: Fluoroscopy time 1 minutes 4 seconds. Five nondiagnostic spot fluoroscopic intraoperative radiographs of the lumbar spine demonstrate  postsurgical changes from bilateral posterior spinal fusion from L3-L5 with bone cages in the L3-4 and L4-5 disc spaces. IMPRESSION: Intraoperative fluoroscopic guidance for bilateral posterior spinal fusion from L3-L5. Electronically Signed   By: Ilona Sorrel M.D.   On: 09/15/2016 20:00  Dg C-arm 1-60 Min  Result Date: 09/15/2016 CLINICAL DATA:  L3-L5 PLIF. EXAM: LUMBAR SPINE - 2-3 VIEW; DG C-ARM 61-120 MIN COMPARISON:  06/18/2016 lumbar spine MRI FINDINGS: Fluoroscopy time 1 minutes 4 seconds. Five nondiagnostic spot fluoroscopic intraoperative radiographs of the lumbar spine demonstrate postsurgical changes from bilateral posterior spinal fusion from L3-L5 with bone cages in the L3-4 and L4-5 disc spaces. IMPRESSION: Intraoperative fluoroscopic guidance for bilateral posterior spinal fusion from L3-L5. Electronically Signed   By: Ilona Sorrel M.D.   On: 09/15/2016 20:00    HOSPITAL DIAGNOSES: Active Problems:   Lumbar stenosis with neurogenic claudication   Surgery, elective   Bradycardia, sinus   IMPRESSION: 1. Intraoperative bradycardia  RECOMMENDATION: 1. Noted to have bradycardia and hypotensive intraoperatively - much after starting the case. DDx includes vagal response (responded to atropine), conduction delay or possibly ischemia. Will obtain a 2D echo today, cycle troponins. Was given Toprol XL this am already - will discontinue. Could use ARB if needed for BP tomorrow. Repeat 12 lead EKG in am tomorrow.  Thanks for the consultation. Cardiology will follow with you.  Time Spent Directly with Patient: 30 minutes  Pixie Casino, MD, [facility] Attending Cardiologist [place] 09/16/2016, 12:13 PM

## 2016-09-16 NOTE — Progress Notes (Signed)
Patient ID: [name] [name], male   DOB: 04-05-1956, 61 y.o.   MRN: [id number] Vital signs stable. Patient has no complaints of the incisional back pain Motor function is intact in lower extremities Dressing clean and dry Stable postop

## 2016-09-16 NOTE — Progress Notes (Signed)
PT Cancellation Note  Patient Details Name: [name] [name] MRN: [id number] DOB: 1955/12/17   Cancelled Treatment:    Reason Eval/Treat Not Completed: Medical issues which prohibited therapy. Per pt's RN, Cardiology would like to see pt prior to therapy working with pt. Pt also with significant pain this AM. PT will continue to f/u with pt as appropriate and available.    [name] 09/16/2016, 10:25 AM

## 2016-09-16 NOTE — Care Management Note (Signed)
Case Management Note  Patient Details  Name: Kinston Remmick Hodkinson MRN: [id number] Date of Birth: 1955/12/30  Subjective/Objective:      S/p PLIF and carpal tunnel surgery, he had some bradycardia after surgery, Cards to see.  He lives with fiance, pta indep.  He has insurance with [employer] and gets meds thru [facility] , [facility] on [address].  He has never had [facility] services before, he lives in [place].  Await pt/ot eval, NCM will cont to follow for dc needs.               Action/Plan:   Expected Discharge Date:                  Expected Discharge Plan:  [place]  In-House Referral:     Discharge planning Services  CM Consult  Post Acute Care Choice:    Choice offered to:     DME Arranged:    DME Agency:     HH Arranged:    [place] Agency:     Status of Service:  In process, will continue to follow  If discussed at Long Length of Stay Meetings, dates discussed:    Additional Comments:  Zenon Mayo, RN 09/16/2016, 11:15 AM

## 2016-09-16 NOTE — Clinical Social Work Note (Signed)
CSW acknowledges SNF consult. Awaiting PT evaluation.  [name] [name], [name]

## 2016-09-16 NOTE — Progress Notes (Signed)
OT Cancellation Note  Patient Details Name: [name] [name] MRN: [id number] DOB: November 22, 1955   Cancelled Treatment:    Reason Eval/Treat Not Completed: Medical issues which prohibited therapy. Pt vomiting. Will follow.  Malka So 09/16/2016, 1:51 PM  [phone]

## 2016-09-16 NOTE — Progress Notes (Signed)
Pt placed on Auto CPAP with max pressure 20cmH20 and min pressure 7cmH20. Pt wearing home mask, tolerating well at this time. RT will continue to monitor.

## 2016-09-16 NOTE — Progress Notes (Signed)
OT Cancellation Note  Patient Details Name: [name] [name] MRN: [id number] DOB: 10/10/1955   Cancelled Treatment:    Reason Eval/Treat Not Completed: Pain limiting ability to participate. Pt awaiting cardiology consult. Will follow.  Malka So 09/16/2016, 10:26 AM  [phone]

## 2016-09-16 NOTE — Progress Notes (Signed)
PT Cancellation Note  Patient Details Name: [name] [name] MRN: [id number] DOB: 1955-12-28   Cancelled Treatment:    Reason Eval/Treat Not Completed: Other (comment). Second attempt to see pt today. Pt with emesis x2 and requesting therapist return at another time. PT will continue to f/u with pt as available and appropriate.    Clearnce Sorrel Wanda Cellucci 09/16/2016, 1:48 PM

## 2016-09-17 ENCOUNTER — Inpatient Hospital Stay (HOSPITAL_COMMUNITY)

## 2016-09-17 DIAGNOSIS — R001 Bradycardia, unspecified: Secondary | ICD-10-CM

## 2016-09-17 LAB — ECHOCARDIOGRAM COMPLETE
CHL CUP DOP CALC LVOT VTI: 25.6 cm
EERAT: 8.72
FS: 45 % — AB (ref 28–44)
Height: 70 in
IV/PV OW: 1
LA ID, A-P, ES: 37 mm
LA vol A4C: 55.2 ml
LA vol: 56.9 mL
LADIAMINDEX: 1.66 cm/m2
LAVOLIN: 25.5 mL/m2
LEFT ATRIUM END SYS DIAM: 37 mm
LV E/e' medial: 8.72
LV e' LATERAL: 11.3 cm/s
LVEEAVG: 8.72
LVOT SV: 106 mL
LVOT area: 4.15 cm2
LVOT peak grad rest: 7 mmHg
LVOT peak vel: 130 cm/s
LVOTD: 23 mm
Lateral S' vel: 17.6 cm/s
MV Peak grad: 4 mmHg
MVAP: 3.86 cm2
MVPKAVEL: 94.6 m/s
MVPKEVEL: 98.5 m/s
P 1/2 time: 57 ms
PW: 9 mm — AB (ref 0.6–1.1)
TAPSE: 20.7 mm
TDI e' lateral: 11.3
TDI e' medial: 10.2
WEIGHTICAEL: 3481.5 [oz_av]

## 2016-09-17 LAB — TROPONIN I: Troponin I: <0.03 ng/mL (ref <0.03)

## 2016-09-17 MED ORDER — DEXAMETHASONE 2 MG PO TABS
2.0000 mg | ORAL_TABLET | Freq: Two times a day (BID) | ORAL | Status: DC
  Administered 2016-09-17 – 2016-09-18 (×3): 2 mg via ORAL
  Filled 2016-09-17 (×4): qty 1

## 2016-09-17 MED ORDER — INFLUENZA VAC SPLIT QUAD 0.5 ML IM SUSY
0.5000 mL | PREFILLED_SYRINGE | INTRAMUSCULAR | Status: AC
  Administered 2016-09-18: 0.5 mL via INTRAMUSCULAR
  Filled 2016-09-17: qty 0.5

## 2016-09-17 MED ORDER — DEXAMETHASONE 1 MG PO TABS: ORAL_TABLET | ORAL | 0 refills | Status: DC

## 2016-09-17 MED ORDER — PANTOPRAZOLE SODIUM 40 MG PO TBEC
40.0000 mg | DELAYED_RELEASE_TABLET | Freq: Every day | ORAL | Status: DC
  Administered 2016-09-17: 40 mg via ORAL
  Filled 2016-09-17: qty 1

## 2016-09-17 NOTE — Evaluation (Signed)
Physical Therapy Evaluation Patient Details Name: [name] [name] MRN: [id number] DOB: Apr 05, 1956 Today's Date: 09/17/2016   History of Present Illness  Pt is a 61 y/o male s/p PLIF L3-L5 and R carpal tunnel release sx. PMH including but not limited to HTN, chronic back pain and bipolar depression.  Clinical Impression  Pt presented standing in room when PT arrived. Prior to admission, pt reported that he was independent with all functional mobility and ADLs. Pt currently requires mod I for transfers, supervision for ambulation with no AD and min guard for safety with ascending and descending stairs. Pt would continue to benefit from skilled physical therapy services at this time while admitted and after d/c to address his below listed limitations in order to improve his overall safety and independence with functional mobility.     Follow Up Recommendations No PT follow up    Equipment Recommendations  None recommended by PT    Recommendations for Other Services       Precautions / Restrictions Precautions Precautions: Back;Fall Precaution Comments: PT reviewed 3/3 back precautions with pt and pt's wife.  Required Braces or Orthoses: Spinal Brace Spinal Brace: Lumbar corset;Applied in sitting position Restrictions Weight Bearing Restrictions: No      Mobility  Bed Mobility               General bed mobility comments: Pt standing in room when PT arrived  Transfers Overall transfer level: Modified independent Equipment used: None                Ambulation/Gait Ambulation/Gait assistance: Supervision Ambulation Distance (Feet): 100 Feet Assistive device: None Gait Pattern/deviations: Step-through pattern Gait velocity: WFL Gait velocity interpretation: at or above normal speed for age/gender General Gait Details: no instability or LOB  Stairs Stairs: Yes Stairs assistance: Min guard Stair Management: One rail Right;Alternating pattern;Forwards Number of  Stairs: 12 General stair comments: no instability or LOB  Wheelchair Mobility    Modified Rankin (Stroke Patients Only)       Balance Overall balance assessment: Needs assistance         Standing balance support: During functional activity;No upper extremity supported Standing balance-Leahy Scale: Good                               Pertinent Vitals/Pain Pain Assessment: 0-10 Pain Score: 6  Pain Location: buttock, LEs Pain Descriptors / Indicators: Sore Pain Intervention(s): Monitored during session    Home Living Family/patient expects to be discharged to:: Private residence Living Arrangements: Spouse/significant other;Children Available Help at Discharge: Family;Available 24 hours/day Type of Home: House Home Access: Stairs to enter Entrance Stairs-Rails: Right Entrance Stairs-Number of Steps: 3 Home Layout: Two level Home Equipment: None      Prior Function Level of Independence: Independent               Hand Dominance   Dominant Hand: Right    Extremity/Trunk Assessment   Upper Extremity Assessment Upper Extremity Assessment: Defer to OT evaluation    Lower Extremity Assessment Lower Extremity Assessment: Overall WFL for tasks assessed    Cervical / Trunk Assessment Cervical / Trunk Assessment: Other exceptions Cervical / Trunk Exceptions: s/p PLIF L3-L5 sx  Communication   Communication: No difficulties  Cognition Arousal/Alertness: Awake/alert Behavior During Therapy: WFL for tasks assessed/performed Overall Cognitive Status: Within Functional Limits for tasks assessed  General Comments      Exercises     Assessment/Plan    PT Assessment Patient needs continued PT services  PT Problem List Decreased strength;Decreased range of motion;Decreased activity tolerance;Decreased balance;Decreased mobility;Decreased coordination;Decreased safety awareness;Decreased knowledge of  precautions;Cardiopulmonary status limiting activity;Pain          PT Treatment Interventions DME instruction;Gait training;Stair training;Functional mobility training;Therapeutic activities;Therapeutic exercise;Balance training;Neuromuscular re-education;Patient/family education    PT Goals (Current goals can be found in the Care Plan section)  Acute Rehab PT Goals Patient Stated Goal: return home PT Goal Formulation: With patient Time For Goal Achievement: 10/01/16 Potential to Achieve Goals: Good    Frequency Min 5X/week   Barriers to discharge        Co-evaluation               End of Session Equipment Utilized During Treatment: Gait belt;Back brace Activity Tolerance: Patient tolerated treatment well Patient left: Other (comment);with family/visitor present (standing in room) Nurse Communication: Mobility status         Time: 1610-9604 PT Time Calculation (min) (ACUTE ONLY): 16 min   Charges:   PT Evaluation $PT Eval Moderate Complexity: 1 Procedure     PT G CodesAlessandra Bevels Deshara Rossi 09/17/2016, 3:58 PM [name] [name], PT, DPT [phone]

## 2016-09-17 NOTE — Progress Notes (Signed)
Placed patient on CPAP for the night.  

## 2016-09-17 NOTE — Progress Notes (Signed)
Echo reviewed -essentially a normal study. No further cardiac recommendations. Keep off b-blocker at discharge. BP has been fairly well-controlled.  No cardiac follow-up necessary. Can follow-up with his PCP.  Thanks for involving [place] in his care. Cardiology will sign-off. Call with questions.  Pixie Casino, MD, [facility] Attending Cardiologist [place]

## 2016-09-17 NOTE — Progress Notes (Signed)
PT Cancellation Note  Patient Details Name: [name] [name] MRN: [id number] DOB: 1956/02/04   Cancelled Treatment:    Reason Eval/Treat Not Completed: Medical issues which prohibited therapy;Pain limiting ability to participate. Pt with significant amount of pain this AM. Pt's nurse in room administering meds with request for therapist to return at another time. Pt's wife stated that he has been up and ambulating to bathroom in his room. PT will continue to f/u with pt as available and appropriate.   [place] 09/17/2016, 9:05 AM

## 2016-09-17 NOTE — Progress Notes (Signed)
DAILY PROGRESS NOTE  Subjective:  No events overnight. Telemetry shows no recurrent bradycardia. Troponin negative x 3. EKG this am is unchanged. Echo is pending.  Objective:  Temp:  [98.2 F (36.8 C)-99.6 F (37.6 C)] 98.3 F (36.8 C) (02/03 0739) Pulse Rate:  [83-108] 83 (02/03 0739) Resp:  [14-22] 14 (02/03 0739) BP: (105-142)/(52-89) 124/83 (02/03 0739) SpO2:  [94 %-99 %] 99 % (02/03 0739) Weight change:   Intake/Output from previous day: 02/02 0701 - 02/03 0700 In: 720 [P.O.:720] Out: 1475 [Urine:1475]  Intake/Output from this shift: Total I/O In: 360 [P.O.:360] Out: 0   Medications: No current facility-administered medications on file prior to encounter.    Current Outpatient Prescriptions on File Prior to Encounter  Medication Sig Dispense Refill  . amphetamine-dextroamphetamine (ADDERALL XR) 10 MG 24 hr capsule Take 10 mg by mouth daily as needed (for energy).     . cyclobenzaprine (FLEXERIL) 10 MG tablet Take 1 tablet (10 mg total) by mouth 3 (three) times daily as needed for muscle spasms. (Patient taking differently: Take 10 mg by mouth daily as needed for muscle spasms. ) 60 tablet 1  . DYMISTA 137-50 MCG/ACT SUSP Place 2 sprays into both nostrils at bedtime as needed. (Patient taking differently: Place 2 sprays into both nostrils at bedtime. ) 69 g 3  . lisdexamfetamine (VYVANSE) 60 MG capsule Take 1 capsule by mouth daily.    . metoprolol succinate (TOPROL XL) 25 MG 24 hr tablet Take 1 tablet (25 mg total) by mouth daily. (Patient taking differently: Take 25 mg by mouth every evening. ) 90 tablet 3  . tadalafil (CIALIS) 5 MG tablet Take 1 tablet (5 mg total) by mouth daily as needed for erectile dysfunction. (Patient taking differently: Take 5 mg by mouth daily. ) 90 tablet 3  . VIIBRYD 20 MG TABS Take 1 tablet by mouth daily.    Marland Kitchen aspirin (BAYER ASPIRIN) 325 MG tablet Take 1 tablet (325 mg total) by mouth daily. (Patient not taking: Reported on 08/25/2016)  100 tablet 3  . ciclopirox (PENLAC) 8 % solution Apply topically at bedtime. Apply over nail and surrounding skin. Apply daily over previous coat. After seven (7) days, may remove with alcohol and continue cycle. (Patient taking differently: Apply 1 application topically at bedtime as needed (toe fungus). Apply over nail and surrounding skin. Apply daily over previous coat. After seven (7) days, may remove with alcohol and continue cycle.) 6.6 mL 1  . montelukast (SINGULAIR) 10 MG tablet Take 1 tablet (10 mg total) by mouth daily. 30 tablet 11  . pantoprazole (PROTONIX) 40 MG tablet Take 1 tablet (40 mg total) by mouth daily before breakfast. 90 tablet 3  . triamcinolone ointment (KENALOG) 0.5 % Apply 1 application topically 2 (two) times daily. (Patient not taking: Reported on 05/05/2016) 45 g 3    Physical Exam: General appearance: alert and no distress Lungs: clear to auscultation bilaterally Heart: regular rate and rhythm, S1, S2 normal, no murmur, click, rub or gallop Neurologic: Grossly normal  Lab Results: Results for orders placed or performed during the hospital encounter of 09/15/16 (from the past 48 hour(s))  Glucose, capillary     Status: Abnormal   Collection Time: 09/16/16  8:34 AM  Result Value Ref Range   Glucose-Capillary 106 (H) 65 - 99 mg/dL  Glucose, capillary     Status: Abnormal   Collection Time: 09/16/16 12:13 PM  Result Value Ref Range   Glucose-Capillary 147 (H) 65 - 99  mg/dL  Troponin I     Status: None   Collection Time: 09/16/16  3:28 PM  Result Value Ref Range   Troponin I <0.03 <0.03 ng/mL  Glucose, capillary     Status: Abnormal   Collection Time: 09/16/16  4:54 PM  Result Value Ref Range   Glucose-Capillary 103 (H) 65 - 99 mg/dL  Troponin I     Status: None   Collection Time: 09/16/16  9:18 PM  Result Value Ref Range   Troponin I <0.03 <0.03 ng/mL  Glucose, capillary     Status: None   Collection Time: 09/16/16  9:18 PM  Result Value Ref Range    Glucose-Capillary 95 65 - 99 mg/dL  Troponin I     Status: None   Collection Time: 09/17/16  2:43 AM  Result Value Ref Range   Troponin I <0.03 <0.03 ng/mL    Imaging: Dg Lumbar Spine 2-3 Views  Result Date: 09/15/2016 CLINICAL DATA:  L3-L5 PLIF. EXAM: LUMBAR SPINE - 2-3 VIEW; DG C-ARM 61-120 MIN COMPARISON:  06/18/2016 lumbar spine MRI FINDINGS: Fluoroscopy time 1 minutes 4 seconds. Five nondiagnostic spot fluoroscopic intraoperative radiographs of the lumbar spine demonstrate postsurgical changes from bilateral posterior spinal fusion from L3-L5 with bone cages in the L3-4 and L4-5 disc spaces. IMPRESSION: Intraoperative fluoroscopic guidance for bilateral posterior spinal fusion from L3-L5. Electronically Signed   By: Ilona Sorrel M.D.   On: 09/15/2016 20:00   Dg C-arm 1-60 Min  Result Date: 09/15/2016 CLINICAL DATA:  L3-L5 PLIF. EXAM: LUMBAR SPINE - 2-3 VIEW; DG C-ARM 61-120 MIN COMPARISON:  06/18/2016 lumbar spine MRI FINDINGS: Fluoroscopy time 1 minutes 4 seconds. Five nondiagnostic spot fluoroscopic intraoperative radiographs of the lumbar spine demonstrate postsurgical changes from bilateral posterior spinal fusion from L3-L5 with bone cages in the L3-4 and L4-5 disc spaces. IMPRESSION: Intraoperative fluoroscopic guidance for bilateral posterior spinal fusion from L3-L5. Electronically Signed   By: Ilona Sorrel M.D.   On: 09/15/2016 20:00    Assessment:  1. Active Problems: 2.   Lumbar stenosis with neurogenic claudication 3.   Surgery, elective 4.   Bradycardia, sinus 5.   Plan:  1. Suspect bradycardia secondary to a vagal event - improved with atropine. No evidence for intraoperative MI. EKG unchanged. Echo pending, however, this should not hold up discharge. I will look for it later today.   Time Spent Directly with Patient:  15 minutes  Length of Stay:  LOS: 2 days   Pixie Casino, MD, [facility] Attending Cardiologist [place] 09/17/2016, 12:34  PM

## 2016-09-17 NOTE — Progress Notes (Signed)
Patient ID: [name] [name], male   DOB: 14-Apr-1956, 61 y.o.   MRN: [id number] Vital signs are stable Back soreness is typical for surgery Patient complains significantly of bilateral hip pain and buttock pain His motor dysfunction appears to be intact in lower extremities Dressing is clean dry and intact I will transfer the patient from 4 E. stepdown to the regular hospital floor We'll start low-dose Decadron with a taper after his discharge to help with the postoperative hip pain Appears to be doing well

## 2016-09-17 NOTE — Progress Notes (Signed)
  Echocardiogram 2D Echocardiogram has been performed.  [name] [name] 09/17/2016, 3:23 PM

## 2016-09-18 MED ORDER — METHOCARBAMOL 500 MG PO TABS: 500.0000 mg | ORAL_TABLET | Freq: Three times a day (TID) | ORAL | 0 refills | Status: DC | PRN

## 2016-09-18 MED ORDER — OXYCODONE-ACETAMINOPHEN 5-325 MG PO TABS: 1.0000 | ORAL_TABLET | ORAL | 0 refills | Status: DC | PRN

## 2016-09-18 MED ORDER — ASPIRIN 325 MG PO TABS: 325.0000 mg | ORAL_TABLET | Freq: Every day | ORAL | 3 refills | Status: DC

## 2016-09-18 NOTE — Discharge Instructions (Signed)
Carpal Tunnel Release, Care After Refer to this sheet in the next few weeks. These instructions provide you with information about caring for yourself after your procedure. Your health care provider may also give you more specific instructions. Your treatment has been planned according to current medical practices, but problems sometimes occur. Call your health care provider if you have any problems or questions after your procedure. What can I expect after the procedure? After your procedure, it is typical to have the following:  Pain.  Numbness.  Tingling.  Swelling.  Stiffness.  Bruising. Follow these instructions at home:  Take medicines only as directed by your health care provider.  There are many different ways to close and cover an incision, including stitches (sutures), skin glue, and adhesive strips. Follow your health care provider's instructions about:  Incision care.  Bandage (dressing) changes and removal.  Incision closure removal.  Wear a splint or a brace as directed by your surgeon. You may need to do this for 2-3 weeks.  Keep your hand raised (elevated) above the level of your heart while you are resting. Move your fingers often.  Avoid activities that cause hand pain.  Ask your surgeon when you can start to do all of your usual activities again, such as:  Driving.  Returning to work.  Bathing and swimming.  Keep all follow-up visits as directed by your health care provider. This is important. You may need physical therapy for several months to speed healing and regain movement. Contact a health care provider if:  You have drainage, redness, swelling, or pain at your incision site.  You have a fever.  You have chills.  Your pain medicine is not working.  Your symptoms do not go away after 2 months.  Your symptoms go away and then return. Get help right away if:  You have pain or numbness that is getting worse.  Your fingers change  color.  You are not able to move your fingers. This information is not intended to replace advice given to you by your health care provider. Make sure you discuss any questions you have with your health care provider. Document Released: 02/18/2005 Document Revised: 01/07/2016 Document Reviewed: 03/19/2014 Elsevier Interactive Patient Education  2017 [place]. Spinal Fusion, Care After Refer to this sheet in the next few weeks. These instructions provide you with information about caring for yourself after your procedure. Your health care provider may also give you more specific instructions. Your treatment has been planned according to current medical practices, but problems sometimes occur. Call your health care provider if you have any problems or questions after your procedure. WHAT TO EXPECT AFTER THE PROCEDURE After your procedure, it is common to have:  Pain and stiffness in your back.  Pain around your incision. HOME CARE INSTRUCTIONS  Medicines  Take over-the-counter and prescription medicines only as told by your health care provider. These include any pain medicines.  Do not drive for 24 hours if you received a sedative.  Do not drive or operate heavy machinery while taking prescription pain medicine.  If you were prescribed an antibiotic medicine, take it as told by your health care provider. Do not stop taking the antibiotic even if you start to feel better. Incision Care  Follow instructions from your health care provider about how to take care of your incision. Make sure you:  Wash your hands with soap and water before you change your bandage (dressing). If soap and water are not available, use hand  sanitizer.  Change your dressing as told by your health care provider.  Leave stitches (sutures), skin glue, or adhesive strips in place. These skin closures may need to be in place for 2 weeks or longer. If adhesive strip edges start to loosen and curl up, you may  trim the loose edges. Do not remove adhesive strips completely unless your health care provider tells you to do that.  Keep your incision clean and dry. Do not take baths, swim, or use a hot tub until your health care provider approves.  Check your incision and the surrounding area every day for redness, swelling, and leaking fluid. Physical Activity  Return to your normal activities as told by your health care provider. Ask your health care provider what activities are safe for you. Rest and protect your back as much as possible.  Follow instructions from your health care provider about how to move and use good posture to help your spine heal.  Do not lift anything that is heavier than 8 lb (3.6 kg)or the limit that your health care provider tells you until he or she says that it is safe. Avoid lifting anything over your head.  Do not twist or bend at the waist until your health care provider approves.  Avoid pushing and pulling motions.  Avoid sitting or lying down in the same position for long periods of time.  Do not begin exercising until told by your health care provider. Ask your health care provider what kinds of exercise you can do to make your back stronger. General Instructions  If you were given a brace, use it as told by your health care provider.  Wear compression stockings as told by your health care provider. These stockings help to prevent blood clots and reduce swelling in your legs.  Do not use tobacco products, including cigarettes, chewing tobacco, or e-cigarettes. If you need help quitting, ask your health care provider.  Keep all follow-up visits as told by your health care provider and, if necessary, your physical therapist. This is important. SEEK MEDICAL CARE IF:  You have pain that gets worse or does not get better with medicine.  Your legs or feet become painful or swollen.  You have redness, swelling, or pain at the site of your incision.  You have  fluid, blood, or pus coming from your incision.  You vomit or feel nauseous.  You have weakness or numbness in your legs that is new or getting worse.  You have a fever.  You have trouble controlling urination or bowel movements. SEEK IMMEDIATE MEDICAL CARE IF:   You have severe pain.  You have chest pain.  You have trouble breathing.  You develop a cough. These symptoms may represent a serious problem that is an emergency. Do not wait to see if the symptoms will go away. Get medical help right away. Call your local emergency services (911 in the [place] Do not drive yourself to the hospital. This information is not intended to replace advice given to you by your health care provider. Make sure you discuss any questions you have with your health care provider. Document Released: 02/18/2005 Document Revised: 11/23/2015 Document Reviewed: 01/14/2015 Elsevier Interactive Patient Education  2017 [employer].

## 2016-09-18 NOTE — Discharge Summary (Addendum)
Physician Discharge Summary  Patient ID: [name] [name] MRN: [id number] DOB/AGE: 63-Feb-1957 63 y.o.  Admit date: 09/15/2016 Discharge date: 09/18/2016  Admission Diagnoses:  Lumbar stenosis Median neuropathy  Discharge Diagnoses:  Same Active Problems:   Lumbar stenosis with neurogenic claudication   Surgery, elective   Bradycardia, sinus   Discharged Condition: Stable  Hospital Course:  [name] [name] is a 61 y.o. male electively admitted after L3-4/L4-5 PLIF with concurrent right carpal tunnel release. He was at baseline postop. He did complain of hip pain which was improved with steroids. He was ambulating well, tolerating diet, voiding normally.  Treatments: Surgery - L3-4/L4-5 PLIF, right CTR  Discharge Exam: Blood pressure 115/74, pulse 73, temperature 98.4 F (36.9 C), temperature source Oral, resp. rate 18, height 5\' 10"  (1.778 m), weight 98.7 kg (217 lb 9.5 oz), SpO2 97 %. Awake, alert, oriented Speech fluent, appropriate CN grossly intact 5/5 BUE/BLE Wound c/d/i  Disposition: 01-Home or Self Care  Discharge Instructions     Remove dressing in 72 hours    Complete by:  As directed    Call MD for:  redness, tenderness, or signs of infection (pain, swelling, redness, odor or green/yellow discharge around incision site)    Complete by:  As directed    Call MD for:  temperature >100.4    Complete by:  As directed    Diet - low sodium heart healthy    Complete by:  As directed    Discharge instructions    Complete by:  As directed    Walk at home as much as possible, at least 4 times / day   Increase activity slowly    Complete by:  As directed    Lifting restrictions    Complete by:  As directed    No lifting > 10 lbs   May shower / Bathe    Complete by:  As directed    48 hours after surgery   May walk up steps    Complete by:  As directed    Other Restrictions    Complete by:  As directed    No bending/twisting at waist     Allergies as of 09/18/2016       Reactions   Ciprofloxacin-dexamethasone Rash   REDNESS AND IRRITATION   Propofol Rash      Medication List    TAKE these medications   amphetamine-dextroamphetamine 10 MG 24 hr capsule Commonly known as:  ADDERALL XR Take 10 mg by mouth daily as needed (for energy).   aspirin 325 MG tablet Commonly known as:  BAYER ASPIRIN Take 1 tablet (325 mg total) by mouth daily. Start taking on:  09/23/2016 What changed:  Another medication with the same name was removed. Continue taking this medication, and follow the directions you see here.   ciclopirox 8 % solution Commonly known as:  PENLAC Apply topically at bedtime. Apply over nail and surrounding skin. Apply daily over previous coat. After seven (7) days, may remove with alcohol and continue cycle. What changed:  how much to take  when to take this  reasons to take this  additional instructions   cyclobenzaprine 10 MG tablet Commonly known as:  FLEXERIL Take 1 tablet (10 mg total) by mouth 3 (three) times daily as needed for muscle spasms. What changed:  when to take this   dexamethasone 1 MG tablet Commonly known as:  DECADRON 2 tablets twice daily for 2 days, one tablet twice daily for 2 days, one tablet  daily for 2 days.   DYMISTA 137-50 MCG/ACT Susp Generic drug:  Azelastine-Fluticasone Place 2 sprays into both nostrils at bedtime as needed. What changed:  when to take this   HYDROcodone-acetaminophen 5-325 MG tablet Commonly known as:  NORCO/VICODIN Take 1-2 tablets by mouth 2 (two) times daily as needed for moderate pain or severe pain.   meloxicam 15 MG tablet Commonly known as:  MOBIC Take 1 tablet (15 mg total) by mouth daily.   meloxicam 15 MG tablet Commonly known as:  MOBIC Take 1 tablet (15 mg total) by mouth daily.   methocarbamol 500 MG tablet Commonly known as:  ROBAXIN Take 1 tablet (500 mg total) by mouth every 8 (eight) hours as needed for muscle spasms.   metoprolol succinate 25 MG 24 hr  tablet Commonly known as:  TOPROL XL Take 1 tablet (25 mg total) by mouth daily. What changed:  when to take this   montelukast 10 MG tablet Commonly known as:  SINGULAIR Take 1 tablet (10 mg total) by mouth daily.   oxyCODONE-acetaminophen 5-325 MG tablet Commonly known as:  PERCOCET/ROXICET Take 1-2 tablets by mouth every 4 (four) hours as needed for severe pain (depends on pain). What changed:  when to take this   pantoprazole 40 MG tablet Commonly known as:  PROTONIX Take 1 tablet (40 mg total) by mouth daily before breakfast.   tadalafil 5 MG tablet Commonly known as:  CIALIS Take 1 tablet (5 mg total) by mouth daily as needed for erectile dysfunction. What changed:  when to take this   triamcinolone ointment 0.5 % Commonly known as:  KENALOG Apply 1 application topically 2 (two) times daily.   VIIBRYD 20 MG Tabs Generic drug:  Vilazodone HCl Take 1 tablet by mouth daily.   VYVANSE 60 MG capsule Generic drug:  lisdexamfetamine Take 1 capsule by mouth daily.   WELLBUTRIN PO Take 300 mg by mouth.      Follow-up Information    STERN,JOSEPH D, MD Follow up in 7 day(s).   Specialty:  Neurosurgery Why:  For suture removal Contact information: [address] [place] [address] [phone]           Signed: Consuella Lose, Loletha Grayer 09/18/2016, 11:00 AM

## 2016-09-18 NOTE — Progress Notes (Signed)
Pt d/c to home by car with family. Assessment stable. Prescriptions given. All questions answered. 

## 2016-09-18 NOTE — Evaluation (Signed)
Occupational Therapy Evaluation and Discharge Patient Details Name: [name] [name] MRN: [id number] DOB: 02/21/1956 Today's Date: 09/18/2016    History of Present Illness Pt is a 61 y/o male s/p PLIF L3-L5 and R carpal tunnel release sx. PMH including but not limited to HTN, chronic back pain and bipolar depression.   Clinical Impression   This 61 yo male admitted and underwent above presents to acute OT with all education completed, we will D/C from acute OT.    Follow Up Recommendations  No OT follow up    Equipment Recommendations  None recommended by OT       Precautions / Restrictions Precautions Precautions: Back;Fall Precaution Booklet Issued: Yes (comment) Precaution Comments: reviewed 3/3 back precautions with pt and pt's wife.  Required Braces or Orthoses: Spinal Brace Spinal Brace: Lumbar corset;Applied in sitting position Restrictions Weight Bearing Restrictions: No              ADL                                         General ADL Comments: Educated pt and wife on toilet transfer (slightly straddling front of toilet and turning feet out); using wet wipes for back peri-care with left hand and OK with right hand as well as long as have glove on it to keep bandage dry); putting shirt and brace on first then do LBD to give more support; using 2 cups for brushing teeth (one to rinse and one to spit). Asked pt if he would like to practice anything I had shown and/or demonstrated to him and he reported that he felt like he understood and did not need to practice as well as some of the things he had already figured out.         Hand Dominance Right   Extremity/Trunk Assessment Upper Extremity Assessment Upper Extremity Assessment: Overall WFL for tasks assessed           Communication Communication Communication: No difficulties   Cognition Arousal/Alertness: Awake/alert Behavior During Therapy: WFL for tasks assessed/performed Overall  Cognitive Status: Within Functional Limits for tasks assessed                                Home Living Family/patient expects to be discharged to:: Private residence Living Arrangements: Spouse/significant other;Children Available Help at Discharge: Family;Available 24 hours/day Type of Home: House Home Access: Stairs to enter [employer] of Steps: 3 Entrance Stairs-Rails: Right Home Layout: Two level Alternate Level Stairs-Number of Steps: 12 Alternate Level Stairs-Rails: Right Bathroom Shower/Tub: Occupational psychologist: Standard     Home Equipment: Hand held shower head          Prior Functioning/Environment Level of Independence: Independent                       OT Goals(Current goals can be found in the care plan section) Acute Rehab OT Goals Patient Stated Goal: return home  OT Frequency:                End of Session Nurse Communication:  (Pt ready to go from OT standpoint)  Activity Tolerance: Patient tolerated treatment well Patient left: in bed;with call bell/phone within reach;with family/visitor present   Time: 1003-1015 OT Time Calculation (min): 12 min Charges:  OT General Charges $OT Visit: 1 Procedure OT Evaluation $OT Eval Low Complexity: 1 Procedure  Almon Register [id number] 09/18/2016, 10:29 AM

## 2016-09-21 NOTE — Anesthesia Postprocedure Evaluation (Signed)
Anesthesia Post Note  Patient: [name] [name]  Procedure(s) Performed: Procedure(s) (LRB): LUMBAR THREE-FOUR, LUMBAR FOUR-FIVE  MAXIMUM ACCESS POSTERIOR LUMBAR INTERBODY FUSION (N/A) CARPAL TUNNEL RELEASE-RIGHT (Right)  Patient location during evaluation: PACU Anesthesia Type: General Level of consciousness: awake and alert Pain management: pain level controlled Vital Signs Assessment: post-procedure vital signs reviewed and stable Respiratory status: spontaneous breathing, nonlabored ventilation, respiratory function stable and patient connected to nasal cannula oxygen Cardiovascular status: blood pressure returned to baseline and stable Postop Assessment: no signs of nausea or vomiting Anesthetic complications: no       Last Vitals:  Vitals:   09/18/16 0609 09/18/16 0943  BP: (!) 116/54 115/74  Pulse: 78 73  Resp: 18 18  Temp: 36.8 C 36.9 C    Last Pain:  Vitals:   09/18/16 0943  TempSrc: Oral  PainSc:                  Salem Mastrogiovanni S

## 2016-10-21 ENCOUNTER — Other Ambulatory Visit: Payer: Self-pay | Admitting: Internal Medicine

## 2016-12-02 ENCOUNTER — Other Ambulatory Visit (INDEPENDENT_AMBULATORY_CARE_PROVIDER_SITE_OTHER)

## 2016-12-02 ENCOUNTER — Telehealth: Payer: Self-pay

## 2016-12-02 DIAGNOSIS — D751 Secondary polycythemia: Secondary | ICD-10-CM

## 2016-12-02 LAB — CBC WITH DIFFERENTIAL/PLATELET
BASOS ABS: 0 10*3/uL (ref 0.0–0.1)
Basophils Relative: 0.7 % (ref 0.0–3.0)
EOS ABS: 0.3 10*3/uL (ref 0.0–0.7)
Eosinophils Relative: 6 % — ABNORMAL HIGH (ref 0.0–5.0)
HCT: 45.4 % (ref 39.0–52.0)
HEMOGLOBIN: 14.6 g/dL (ref 13.0–17.0)
Lymphocytes Relative: 24.6 % (ref 12.0–46.0)
Lymphs Abs: 1.2 10*3/uL (ref 0.7–4.0)
MCHC: 32.1 g/dL (ref 30.0–36.0)
MCV: 81.6 fl (ref 78.0–100.0)
Monocytes Absolute: 0.6 10*3/uL (ref 0.1–1.0)
Monocytes Relative: 12.5 % — ABNORMAL HIGH (ref 3.0–12.0)
Neutro Abs: 2.8 10*3/uL (ref 1.4–7.7)
Neutrophils Relative %: 56.2 % (ref 43.0–77.0)
Platelets: 338 10*3/uL (ref 150.0–400.0)
RBC: 5.56 Mil/uL (ref 4.22–5.81)
RDW: 17.7 % — AB (ref 11.5–15.5)
WBC: 5 10*3/uL (ref 4.0–10.5)

## 2016-12-02 NOTE — Telephone Encounter (Signed)
Lab called and asked to put in a CBC for patient, order placed

## 2017-01-10 ENCOUNTER — Ambulatory Visit: Admitting: Internal Medicine

## 2017-01-16 ENCOUNTER — Other Ambulatory Visit (INDEPENDENT_AMBULATORY_CARE_PROVIDER_SITE_OTHER)

## 2017-01-16 ENCOUNTER — Encounter: Payer: Self-pay | Admitting: Internal Medicine

## 2017-01-16 ENCOUNTER — Ambulatory Visit (INDEPENDENT_AMBULATORY_CARE_PROVIDER_SITE_OTHER): Admitting: Internal Medicine

## 2017-01-16 VITALS — BP 138/76 | HR 83 | Temp 98.2°F | Ht 69.0 in | Wt 207.0 lb

## 2017-01-16 DIAGNOSIS — M48062 Spinal stenosis, lumbar region with neurogenic claudication: Secondary | ICD-10-CM

## 2017-01-16 DIAGNOSIS — F4323 Adjustment disorder with mixed anxiety and depressed mood: Secondary | ICD-10-CM

## 2017-01-16 DIAGNOSIS — I10 Essential (primary) hypertension: Secondary | ICD-10-CM | POA: Diagnosis not present

## 2017-01-16 DIAGNOSIS — Z6831 Body mass index (BMI) 31.0-31.9, adult: Secondary | ICD-10-CM | POA: Diagnosis not present

## 2017-01-16 DIAGNOSIS — M150 Primary generalized (osteo)arthritis: Secondary | ICD-10-CM

## 2017-01-16 DIAGNOSIS — Z23 Encounter for immunization: Secondary | ICD-10-CM

## 2017-01-16 DIAGNOSIS — E785 Hyperlipidemia, unspecified: Secondary | ICD-10-CM

## 2017-01-16 DIAGNOSIS — E6609 Other obesity due to excess calories: Secondary | ICD-10-CM

## 2017-01-16 DIAGNOSIS — M159 Polyosteoarthritis, unspecified: Secondary | ICD-10-CM

## 2017-01-16 LAB — BASIC METABOLIC PANEL
BUN: 17 mg/dL (ref 6–23)
CALCIUM: 9.3 mg/dL (ref 8.4–10.5)
CO2: 30 mEq/L (ref 19–32)
Chloride: 104 mEq/L (ref 96–112)
Creatinine, Ser: 1.1 mg/dL (ref 0.40–1.50)
GFR: 71.82 mL/min (ref >60.00)
GLUCOSE: 105 mg/dL — AB (ref 70–99)
POTASSIUM: 4.3 meq/L (ref 3.5–5.1)
SODIUM: 138 meq/L (ref 135–145)

## 2017-01-16 MED ORDER — HYDROCODONE-ACETAMINOPHEN 5-325 MG PO TABS: 1.0000 | ORAL_TABLET | Freq: Two times a day (BID) | ORAL | 0 refills | Status: DC | PRN

## 2017-01-16 NOTE — Addendum Note (Signed)
Addended by: Karren Cobble on: 01/16/2017 11:50 AM   Modules accepted: Orders

## 2017-01-16 NOTE — Assessment & Plan Note (Signed)
Toprol po

## 2017-01-16 NOTE — Assessment & Plan Note (Signed)
Norco prn  Potential benefits of a long term opioids use as well as potential risks (i.e. addiction risk, apnea etc) and complications (i.e. Somnolence, constipation and others) were explained to the patient and were aknowledged. 

## 2017-01-16 NOTE — Assessment & Plan Note (Signed)
  On diet  

## 2017-01-16 NOTE — Assessment & Plan Note (Signed)
Wt Readings from Last 3 Encounters:  01/16/17 207 lb (93.9 kg)  09/15/16 217 lb 9.5 oz (98.7 kg)  09/01/16 221 lb 3.2 oz (100.3 kg)

## 2017-01-16 NOTE — Anesthesia Postprocedure Evaluation (Signed)
Anesthesia Post Note  Patient: [name] [name]  Procedure(s) Performed: Procedure(s) (LRB): LUMBAR THREE-FOUR, LUMBAR FOUR-FIVE  MAXIMUM ACCESS POSTERIOR LUMBAR INTERBODY FUSION (N/A) CARPAL TUNNEL RELEASE-RIGHT (Right)     Anesthesia Post Evaluation  Last Vitals:  Vitals:   09/18/16 0609 09/18/16 0943  BP: (!) 116/54 115/74  Pulse: 78 73  Resp: 18 18  Temp: 36.8 C 36.9 C    Last Pain:  Vitals:   09/18/16 0943  TempSrc: Oral  PainSc:                  Kely Dohn S

## 2017-01-16 NOTE — Addendum Note (Signed)
Addendum  created 01/16/17 1043 by Myrtie Soman, MD   Sign clinical note

## 2017-01-16 NOTE — Progress Notes (Signed)
Subjective:  Patient ID: [name] [name], male    DOB: [birth year]  Age: 61 y.o. MRN: [id number]  CC: No chief complaint on file.   HPI [name] [name] presents for LBP, depression, anxiety, GERD f/u He had B CTS and back surgery by Dr Vertell Limber...  Outpatient Medications Prior to Visit  Medication Sig Dispense Refill  . amphetamine-dextroamphetamine (ADDERALL XR) 10 MG 24 hr capsule Take 10 mg by mouth daily as needed (for energy).     Marland Kitchen aspirin (BAYER ASPIRIN) 325 MG tablet Take 1 tablet (325 mg total) by mouth daily. 100 tablet 3  . ciclopirox (PENLAC) 8 % solution Apply topically at bedtime. Apply over nail and surrounding skin. Apply daily over previous coat. After seven (7) days, may remove with alcohol and continue cycle. (Patient taking differently: Apply 1 application topically at bedtime as needed (toe fungus). Apply over nail and surrounding skin. Apply daily over previous coat. After seven (7) days, may remove with alcohol and continue cycle.) 6.6 mL 1  . cyclobenzaprine (FLEXERIL) 10 MG tablet TAKE 1 TABLET THREE TIMES A DAY AS NEEDED FOR MUSCLE SPASMS 60 tablet 1  . DYMISTA 137-50 MCG/ACT SUSP Place 2 sprays into both nostrils at bedtime as needed. (Patient taking differently: Place 2 sprays into both nostrils at bedtime. ) 69 g 3  . HYDROcodone-acetaminophen (NORCO/VICODIN) 5-325 MG tablet Take 1-2 tablets by mouth 2 (two) times daily as needed for moderate pain or severe pain. 90 tablet 0  . lisdexamfetamine (VYVANSE) 50 MG capsule Take 1 capsule by mouth daily.    . meloxicam (MOBIC) 15 MG tablet Take 1 tablet (15 mg total) by mouth daily. 90 tablet 1  . metoprolol succinate (TOPROL XL) 25 MG 24 hr tablet Take 1 tablet (25 mg total) by mouth daily. (Patient taking differently: Take 25 mg by mouth every evening. ) 90 tablet 3  . pantoprazole (PROTONIX) 40 MG tablet Take 1 tablet (40 mg total) by mouth daily before breakfast. 90 tablet 3  . tadalafil (CIALIS) 5 MG tablet Take 1 tablet (5  mg total) by mouth daily as needed for erectile dysfunction. (Patient taking differently: Take 5 mg by mouth daily. ) 90 tablet 3  . BuPROPion HCl (WELLBUTRIN PO) Take 300 mg by mouth.     . dexamethasone (DECADRON) 1 MG tablet 2 tablets twice daily for 2 days, one tablet twice daily for 2 days, one tablet daily for 2 days. 15 tablet 0  . meloxicam (MOBIC) 15 MG tablet Take 1 tablet (15 mg total) by mouth daily. (Patient not taking: Reported on 08/25/2016) 30 tablet 0  . methocarbamol (ROBAXIN) 500 MG tablet Take 1 tablet (500 mg total) by mouth every 8 (eight) hours as needed for muscle spasms. 90 tablet 0  . montelukast (SINGULAIR) 10 MG tablet Take 1 tablet (10 mg total) by mouth daily. 30 tablet 11  . oxyCODONE-acetaminophen (PERCOCET/ROXICET) 5-325 MG tablet Take 1-2 tablets by mouth every 4 (four) hours as needed for severe pain (depends on pain). 60 tablet 0  . triamcinolone ointment (KENALOG) 0.5 % Apply 1 application topically 2 (two) times daily. (Patient not taking: Reported on 05/05/2016) 45 g 3  . VIIBRYD 20 MG TABS Take 1 tablet by mouth daily.     No facility-administered medications prior to visit.     ROS Review of Systems  Constitutional: Positive for fatigue. Negative for appetite change and unexpected weight change.  HENT: Negative for congestion, nosebleeds, sneezing, sore throat and trouble swallowing.  Eyes: Negative for itching and visual disturbance.  Respiratory: Negative for cough.   Cardiovascular: Negative for chest pain, palpitations and leg swelling.  Gastrointestinal: Negative for abdominal distention, blood in stool, diarrhea and nausea.  Genitourinary: Negative for frequency and hematuria.  Musculoskeletal: Positive for back pain. Negative for gait problem, joint swelling and neck pain.  Skin: Negative for rash.  Neurological: Negative for dizziness, tremors, speech difficulty and weakness.  Psychiatric/Behavioral: Positive for sleep disturbance. Negative for  agitation, dysphoric mood and suicidal ideas. The patient is not nervous/anxious.     Objective:  BP 138/76 (BP Location: Left Arm, Patient Position: Sitting, Cuff Size: Normal)   Pulse 83   Temp 98.2 F (36.8 C) (Oral)   Ht 5\' 9"  (1.753 m)   Wt 207 lb (93.9 kg)   SpO2 98%   BMI 30.57 kg/m   BP Readings from Last 3 Encounters:  01/16/17 138/76  09/18/16 115/74  09/01/16 140/83    Wt Readings from Last 3 Encounters:  01/16/17 207 lb (93.9 kg)  09/15/16 217 lb 9.5 oz (98.7 kg)  09/01/16 221 lb 3.2 oz (100.3 kg)    Physical Exam  Constitutional: He is oriented to person, place, and time. He appears well-developed. No distress.  NAD  HENT:  Mouth/Throat: Oropharynx is clear and moist.  Eyes: Conjunctivae are normal. Pupils are equal, round, and reactive to light.  Neck: Normal range of motion. No JVD present. No thyromegaly present.  Cardiovascular: Normal rate, regular rhythm, normal heart sounds and intact distal pulses.  Exam reveals no gallop and no friction rub.   No murmur heard. Pulmonary/Chest: Effort normal and breath sounds normal. No respiratory distress. He has no wheezes. He has no rales. He exhibits no tenderness.  Abdominal: Soft. Bowel sounds are normal. He exhibits no distension and no mass. There is no tenderness. There is no rebound and no guarding.  Musculoskeletal: Normal range of motion. He exhibits tenderness. He exhibits no edema.  Lymphadenopathy:    He has no cervical adenopathy.  Neurological: He is alert and oriented to person, place, and time. He has normal reflexes. No cranial nerve deficit. He exhibits normal muscle tone. He displays a negative Romberg sign. Coordination and gait normal.  Skin: Skin is warm and dry. No rash noted.  Psychiatric: He has a normal mood and affect. His behavior is normal. Judgment and thought content normal.    Lab Results  Component Value Date   WBC 5.0 12/02/2016   HGB 14.6 12/02/2016   HCT 45.4 12/02/2016    PLT 338.0 12/02/2016   GLUCOSE 100 (H) 09/01/2016   CHOL 183 05/06/2016   TRIG 114.0 05/06/2016   HDL 38.20 (L) 05/06/2016   LDLDIRECT 77.5 12/26/2008   LDLCALC 122 (H) 05/06/2016   ALT 21 05/06/2016   AST 16 05/06/2016   NA 138 09/01/2016   K 4.5 09/01/2016   CL 105 09/01/2016   CREATININE 1.08 09/01/2016   BUN 28 (H) 09/01/2016   CO2 26 09/01/2016   TSH 1.60 05/06/2016   PSA 2.50 05/06/2016   HGBA1C 6.0 07/03/2014    No results found.  Assessment & Plan:   There are no diagnoses linked to this encounter. I have discontinued Mr. Jaxson Anglin, montelukast, triamcinolone ointment, BuPROPion HCl (WELLBUTRIN PO), dexamethasone, oxyCODONE-acetaminophen, and methocarbamol. I am also having him maintain his amphetamine-dextroamphetamine, lisdexamfetamine, ciclopirox, DYMISTA, pantoprazole, metoprolol succinate, tadalafil, meloxicam, HYDROcodone-acetaminophen, aspirin, cyclobenzaprine, lithium, QUEtiapine, and tiZANidine.  Meds ordered this encounter  Medications  . lithium 300 MG  tablet    Sig: Take 600 mg by mouth at bedtime.  Marland Kitchen QUEtiapine (SEROQUEL) 200 MG tablet    Sig: Take 200 mg by mouth at bedtime.  Marland Kitchen tiZANidine (ZANAFLEX) 4 MG capsule    Sig: Take 4 mg by mouth 4 (four) times daily as needed for muscle spasms.     Follow-up: No Follow-up on file.  Walker Kehr, MD

## 2017-01-16 NOTE — Assessment & Plan Note (Signed)
Dr Gaye Pollack prn  Potential benefits of a long term opioids use as well as potential risks (i.e. addiction risk, apnea etc) and complications (i.e. Somnolence, constipation and others) were explained to the patient and were aknowledged.

## 2017-01-28 ENCOUNTER — Other Ambulatory Visit: Payer: Self-pay | Admitting: Internal Medicine

## 2017-03-31 ENCOUNTER — Other Ambulatory Visit: Payer: Self-pay | Admitting: Internal Medicine

## 2017-04-26 ENCOUNTER — Other Ambulatory Visit (INDEPENDENT_AMBULATORY_CARE_PROVIDER_SITE_OTHER)

## 2017-04-26 DIAGNOSIS — D751 Secondary polycythemia: Secondary | ICD-10-CM | POA: Diagnosis not present

## 2017-04-26 LAB — CBC WITH DIFFERENTIAL/PLATELET
BASOS PCT: 0.4 % (ref 0.0–3.0)
Basophils Absolute: 0 10*3/uL (ref 0.0–0.1)
EOS ABS: 0.5 10*3/uL (ref 0.0–0.7)
Eosinophils Relative: 5.9 % — ABNORMAL HIGH (ref 0.0–5.0)
HCT: 47.7 % (ref 39.0–52.0)
Hemoglobin: 15.4 g/dL (ref 13.0–17.0)
LYMPHS PCT: 19.9 % (ref 12.0–46.0)
Lymphs Abs: 1.7 10*3/uL (ref 0.7–4.0)
MCHC: 32.2 g/dL (ref 30.0–36.0)
MCV: 84.3 fl (ref 78.0–100.0)
MONO ABS: 0.9 10*3/uL (ref 0.1–1.0)
Monocytes Relative: 10.5 % (ref 3.0–12.0)
Neutro Abs: 5.3 10*3/uL (ref 1.4–7.7)
Neutrophils Relative %: 63.3 % (ref 43.0–77.0)
PLATELETS: 293 10*3/uL (ref 150.0–400.0)
RBC: 5.66 Mil/uL (ref 4.22–5.81)
RDW: 20.6 % — AB (ref 11.5–15.5)
WBC: 8.4 10*3/uL (ref 4.0–10.5)

## 2017-05-09 ENCOUNTER — Other Ambulatory Visit: Payer: Self-pay | Admitting: Internal Medicine

## 2017-07-11 ENCOUNTER — Encounter: Payer: Self-pay | Admitting: Internal Medicine

## 2017-07-11 ENCOUNTER — Ambulatory Visit (INDEPENDENT_AMBULATORY_CARE_PROVIDER_SITE_OTHER): Admitting: Internal Medicine

## 2017-07-11 VITALS — BP 124/72 | HR 69 | Temp 98.2°F | Ht 69.0 in | Wt 199.0 lb

## 2017-07-11 DIAGNOSIS — Z23 Encounter for immunization: Secondary | ICD-10-CM | POA: Diagnosis not present

## 2017-07-11 DIAGNOSIS — D751 Secondary polycythemia: Secondary | ICD-10-CM | POA: Diagnosis not present

## 2017-07-11 DIAGNOSIS — E785 Hyperlipidemia, unspecified: Secondary | ICD-10-CM | POA: Diagnosis not present

## 2017-07-11 DIAGNOSIS — I10 Essential (primary) hypertension: Secondary | ICD-10-CM

## 2017-07-11 DIAGNOSIS — F319 Bipolar disorder, unspecified: Secondary | ICD-10-CM

## 2017-07-11 DIAGNOSIS — R350 Frequency of micturition: Secondary | ICD-10-CM | POA: Diagnosis not present

## 2017-07-11 DIAGNOSIS — N401 Enlarged prostate with lower urinary tract symptoms: Secondary | ICD-10-CM

## 2017-07-11 DIAGNOSIS — M48062 Spinal stenosis, lumbar region with neurogenic claudication: Secondary | ICD-10-CM

## 2017-07-11 DIAGNOSIS — Z0000 Encounter for general adult medical examination without abnormal findings: Secondary | ICD-10-CM

## 2017-07-11 MED ORDER — HYDROCODONE-ACETAMINOPHEN 5-325 MG PO TABS: 1.0000 | ORAL_TABLET | Freq: Two times a day (BID) | ORAL | 0 refills | Status: DC | PRN

## 2017-07-11 MED ORDER — CYCLOBENZAPRINE HCL 10 MG PO TABS: ORAL_TABLET | ORAL | 1 refills | Status: DC

## 2017-07-11 MED ORDER — METOPROLOL SUCCINATE ER 25 MG PO TB24: 25.0000 mg | ORAL_TABLET | Freq: Every day | ORAL | 3 refills | Status: DC

## 2017-07-11 MED ORDER — PANTOPRAZOLE SODIUM 40 MG PO TBEC: 40.0000 mg | DELAYED_RELEASE_TABLET | Freq: Every day | ORAL | 3 refills | Status: DC

## 2017-07-11 MED ORDER — DYMISTA 137-50 MCG/ACT NA SUSP: NASAL | 3 refills | Status: DC

## 2017-07-11 NOTE — Assessment & Plan Note (Signed)
Norco prn  Potential benefits of a long term opioids use as well as potential risks (i.e. addiction risk, apnea etc) and complications (i.e. Somnolence, constipation and others) were explained to the patient and were aknowledged. 

## 2017-07-11 NOTE — Assessment & Plan Note (Signed)
F/u w/Dr Risa Grill

## 2017-07-11 NOTE — Assessment & Plan Note (Signed)
Phlebotomy - goal Hct<45%

## 2017-07-11 NOTE — Assessment & Plan Note (Signed)
On diet Labs 

## 2017-07-11 NOTE — Progress Notes (Signed)
Subjective:  Patient ID: [name] [name], male    DOB: October 25, 1955  Age: 61 y.o. MRN: [id number]  CC: No chief complaint on file.   HPI [name] presents for LBP - seeing Dr Vertell Limber; HTN, depression f/u  Outpatient Medications Prior to Visit  Medication Sig Dispense Refill  . aspirin 81 MG tablet Take 81 mg by mouth daily.    . ciclopirox (PENLAC) 8 % solution Apply topically at bedtime. Apply over nail and surrounding skin. Apply daily over previous coat. After seven (7) days, may remove with alcohol and continue cycle. (Patient taking differently: Apply 1 application topically at bedtime as needed (toe fungus). Apply over nail and surrounding skin. Apply daily over previous coat. After seven (7) days, may remove with alcohol and continue cycle.) 6.6 mL 1  . cyclobenzaprine (FLEXERIL) 10 MG tablet TAKE 1 TABLET THREE TIMES A DAY AS NEEDED FOR MUSCLE SPASMS 90 tablet 0  . DYMISTA 137-50 MCG/ACT SUSP USE 2 SPRAYS IN EACH NOSTRIL AT BEDTIME AS NEEDED 69 g 0  . HYDROcodone-acetaminophen (NORCO/VICODIN) 5-325 MG tablet Take 1-2 tablets by mouth 2 (two) times daily as needed for moderate pain or severe pain. 90 tablet 0  . lisdexamfetamine (VYVANSE) 50 MG capsule Take 1 capsule by mouth daily.    Marland Kitchen lithium carbonate 150 MG capsule Take 750 mg by mouth at bedtime.     . meloxicam (MOBIC) 15 MG tablet TAKE 1 TABLET DAILY 90 tablet 1  . methocarbamol (ROBAXIN) 750 MG tablet Take 750 mg by mouth 3 (three) times daily as needed for muscle spasms.    . metoprolol succinate (TOPROL XL) 25 MG 24 hr tablet Take 1 tablet (25 mg total) by mouth daily. Annual appt due in Sept must see provider for future refills 90 tablet 0  . pantoprazole (PROTONIX) 40 MG tablet Take 1 tablet (40 mg total) by mouth daily before breakfast. Annual appt due in Sept must see provider for future refills 90 tablet 0  . QUEtiapine (SEROQUEL) 50 MG tablet Take 50-150 mg by mouth at bedtime.     . tadalafil (CIALIS) 5 MG tablet Take 1  tablet (5 mg total) by mouth daily as needed for erectile dysfunction. (Patient taking differently: Take 5 mg by mouth daily. ) 90 tablet 3  . testosterone (ANDROGEL) 50 MG/5GM (1%) GEL Place 10 g onto the skin daily.    Marland Kitchen tiZANidine (ZANAFLEX) 4 MG capsule Take 4 mg by mouth 4 (four) times daily as needed for muscle spasms.    Marland Kitchen aspirin (BAYER ASPIRIN) 325 MG tablet Take 1 tablet (325 mg total) by mouth daily. 100 tablet 3   No facility-administered medications prior to visit.     ROS Review of Systems  Constitutional: Negative for appetite change, fatigue and unexpected weight change.  HENT: Negative for congestion, nosebleeds, sneezing, sore throat and trouble swallowing.   Eyes: Negative for itching and visual disturbance.  Respiratory: Negative for cough.   Cardiovascular: Negative for chest pain, palpitations and leg swelling.  Gastrointestinal: Negative for abdominal distention, blood in stool, diarrhea and nausea.  Genitourinary: Negative for frequency and hematuria.  Musculoskeletal: Positive for arthralgias and back pain. Negative for gait problem, joint swelling and neck pain.  Skin: Negative for rash.  Neurological: Negative for dizziness, tremors, speech difficulty and weakness.  Psychiatric/Behavioral: Positive for dysphoric mood. Negative for agitation and sleep disturbance. The patient is nervous/anxious.     Objective:  BP 124/72 (BP Location: Left Arm, Patient Position: Sitting,  Cuff Size: Large)   Pulse 69   Temp 98.2 F (36.8 C) (Oral)   Ht 5\' 9"  (1.753 m)   Wt 199 lb (90.3 kg)   SpO2 98%   BMI 29.39 kg/m   BP Readings from Last 3 Encounters:  07/11/17 124/72  01/16/17 138/76  09/18/16 115/74    Wt Readings from Last 3 Encounters:  07/11/17 199 lb (90.3 kg)  01/16/17 207 lb (93.9 kg)  09/15/16 217 lb 9.5 oz (98.7 kg)    Physical Exam  Constitutional: He is oriented to person, place, and time. He appears well-developed. No distress.  NAD  HENT:    Mouth/Throat: Oropharynx is clear and moist.  Eyes: Conjunctivae are normal. Pupils are equal, round, and reactive to light.  Neck: Normal range of motion. No JVD present. No thyromegaly present.  Cardiovascular: Normal rate, regular rhythm, normal heart sounds and intact distal pulses. Exam reveals no gallop and no friction rub.  No murmur heard. Pulmonary/Chest: Effort normal and breath sounds normal. No respiratory distress. He has no wheezes. He has no rales. He exhibits no tenderness.  Abdominal: Soft. Bowel sounds are normal. He exhibits no distension and no mass. There is no tenderness. There is no rebound and no guarding.  Musculoskeletal: Normal range of motion. He exhibits tenderness. He exhibits no edema.  Lymphadenopathy:    He has no cervical adenopathy.  Neurological: He is alert and oriented to person, place, and time. He has normal reflexes. No cranial nerve deficit. He exhibits normal muscle tone. He displays a negative Romberg sign. Coordination and gait normal.  Skin: Skin is warm and dry. No rash noted.  Psychiatric: He has a normal mood and affect. His behavior is normal. Judgment and thought content normal.   LS tender   Lab Results  Component Value Date   WBC 8.4 04/26/2017   HGB 15.4 04/26/2017   HCT 47.7 04/26/2017   PLT 293.0 04/26/2017   GLUCOSE 105 (H) 01/16/2017   CHOL 183 05/06/2016   TRIG 114.0 05/06/2016   HDL 38.20 (L) 05/06/2016   LDLDIRECT 77.5 12/26/2008   LDLCALC 122 (H) 05/06/2016   ALT 21 05/06/2016   AST 16 05/06/2016   NA 138 01/16/2017   K 4.3 01/16/2017   CL 104 01/16/2017   CREATININE 1.10 01/16/2017   BUN 17 01/16/2017   CO2 30 01/16/2017   TSH 1.60 05/06/2016   PSA 2.50 05/06/2016   HGBA1C 6.0 07/03/2014    No results found.  Assessment & Plan:   There are no diagnoses linked to this encounter. I am having Saketh Daubert. Cinco maintain his lisdexamfetamine, ciclopirox, tadalafil, lithium carbonate, QUEtiapine, tiZANidine,  HYDROcodone-acetaminophen, meloxicam, cyclobenzaprine, DYMISTA, pantoprazole, metoprolol succinate, aspirin, methocarbamol, and testosterone.  No orders of the defined types were placed in this encounter.    Follow-up: No Follow-up on file.  Walker Kehr, MD

## 2017-07-11 NOTE — Assessment & Plan Note (Signed)
F/u w/Dr Clovis Pu

## 2017-07-11 NOTE — Addendum Note (Signed)
Addended by: Karren Cobble on: 07/11/2017 10:19 AM   Modules accepted: Orders

## 2017-07-11 NOTE — Assessment & Plan Note (Signed)
Toprol 

## 2017-10-05 ENCOUNTER — Other Ambulatory Visit: Payer: Self-pay | Admitting: Internal Medicine

## 2017-10-20 ENCOUNTER — Ambulatory Visit (INDEPENDENT_AMBULATORY_CARE_PROVIDER_SITE_OTHER): Admitting: Internal Medicine

## 2017-10-20 ENCOUNTER — Encounter: Payer: Self-pay | Admitting: Internal Medicine

## 2017-10-20 ENCOUNTER — Other Ambulatory Visit (INDEPENDENT_AMBULATORY_CARE_PROVIDER_SITE_OTHER)

## 2017-10-20 DIAGNOSIS — Z6831 Body mass index (BMI) 31.0-31.9, adult: Secondary | ICD-10-CM | POA: Diagnosis not present

## 2017-10-20 DIAGNOSIS — E6609 Other obesity due to excess calories: Secondary | ICD-10-CM | POA: Diagnosis not present

## 2017-10-20 DIAGNOSIS — Z0000 Encounter for general adult medical examination without abnormal findings: Secondary | ICD-10-CM | POA: Diagnosis not present

## 2017-10-20 DIAGNOSIS — E785 Hyperlipidemia, unspecified: Secondary | ICD-10-CM | POA: Diagnosis not present

## 2017-10-20 DIAGNOSIS — D751 Secondary polycythemia: Secondary | ICD-10-CM

## 2017-10-20 DIAGNOSIS — F319 Bipolar disorder, unspecified: Secondary | ICD-10-CM | POA: Diagnosis not present

## 2017-10-20 LAB — CBC WITH DIFFERENTIAL/PLATELET
BASOS ABS: 0 10*3/uL (ref 0.0–0.1)
Basophils Relative: 0.7 % (ref 0.0–3.0)
EOS PCT: 8.4 % — AB (ref 0.0–5.0)
Eosinophils Absolute: 0.4 10*3/uL (ref 0.0–0.7)
HEMATOCRIT: 47.2 % (ref 39.0–52.0)
HEMOGLOBIN: 15.5 g/dL (ref 13.0–17.0)
LYMPHS PCT: 19 % (ref 12.0–46.0)
Lymphs Abs: 1 10*3/uL (ref 0.7–4.0)
MCHC: 32.9 g/dL (ref 30.0–36.0)
MCV: 88.9 fl (ref 78.0–100.0)
MONOS PCT: 11 % (ref 3.0–12.0)
Monocytes Absolute: 0.6 10*3/uL (ref 0.1–1.0)
NEUTROS PCT: 60.9 % (ref 43.0–77.0)
Neutro Abs: 3.1 10*3/uL (ref 1.4–7.7)
Platelets: 256 10*3/uL (ref 150.0–400.0)
RBC: 5.31 Mil/uL (ref 4.22–5.81)
RDW: 15.6 % — ABNORMAL HIGH (ref 11.5–15.5)
WBC: 5.1 10*3/uL (ref 4.0–10.5)

## 2017-10-20 LAB — BASIC METABOLIC PANEL
BUN: 29 mg/dL — ABNORMAL HIGH (ref 6–23)
CALCIUM: 9.9 mg/dL (ref 8.4–10.5)
CO2: 25 meq/L (ref 19–32)
CREATININE: 1.12 mg/dL (ref 0.40–1.50)
Chloride: 102 mEq/L (ref 96–112)
GFR: 70.17 mL/min (ref >60.00)
GLUCOSE: 109 mg/dL — AB (ref 70–99)
Potassium: 4.5 mEq/L (ref 3.5–5.1)
Sodium: 135 mEq/L (ref 135–145)

## 2017-10-20 LAB — LIPID PANEL
CHOL/HDL RATIO: 4
Cholesterol: 182 mg/dL (ref 0–200)
HDL: 42.5 mg/dL (ref >39.00)
LDL Cholesterol: 119 mg/dL — ABNORMAL HIGH (ref 0–99)
NonHDL: 139.41
Triglycerides: 103 mg/dL (ref 0.0–149.0)
VLDL: 20.6 mg/dL (ref 0.0–40.0)

## 2017-10-20 LAB — HEPATIC FUNCTION PANEL
ALK PHOS: 44 U/L (ref 39–117)
ALT: 24 U/L (ref 0–53)
AST: 21 U/L (ref 0–37)
Albumin: 4.1 g/dL (ref 3.5–5.2)
BILIRUBIN TOTAL: 0.4 mg/dL (ref 0.2–1.2)
Bilirubin, Direct: 0 mg/dL (ref 0.0–0.3)
Total Protein: 7.1 g/dL (ref 6.0–8.3)

## 2017-10-20 LAB — TSH: TSH: 2.43 u[IU]/mL (ref 0.35–4.50)

## 2017-10-20 LAB — URINALYSIS
Bilirubin Urine: NEGATIVE
Hgb urine dipstick: NEGATIVE
KETONES UR: NEGATIVE
LEUKOCYTES UA: NEGATIVE
NITRITE: NEGATIVE
SPECIFIC GRAVITY, URINE: 1.02 (ref 1.000–1.030)
Total Protein, Urine: NEGATIVE
Urine Glucose: NEGATIVE
Urobilinogen, UA: 0.2 (ref 0.0–1.0)
pH: 6.5 (ref 5.0–8.0)

## 2017-10-20 LAB — PSA: PSA: 3 ng/mL (ref 0.10–4.00)

## 2017-10-20 MED ORDER — METOPROLOL SUCCINATE ER 25 MG PO TB24: 25.0000 mg | ORAL_TABLET | Freq: Two times a day (BID) | ORAL | 3 refills | Status: DC

## 2017-10-20 NOTE — Assessment & Plan Note (Signed)
.  diet

## 2017-10-20 NOTE — Assessment & Plan Note (Signed)
CBC

## 2017-10-20 NOTE — Assessment & Plan Note (Signed)
Vyvance dose was reduced

## 2017-10-20 NOTE — Patient Instructions (Signed)
Mediterranean Diet A Mediterranean diet refers to food and lifestyle choices that are based on the traditions of countries located on the [place]. This way of eating has been shown to help prevent certain conditions and improve outcomes for people who have chronic diseases, like kidney disease and heart disease. What are tips for following this plan? Lifestyle  Cook and eat meals together with your family, when possible.  Drink enough fluid to keep your urine clear or pale yellow.  Be physically active every day. This includes: ? Aerobic exercise like running or swimming. ? Leisure activities like gardening, walking, or housework.  Get 7-8 hours of sleep each night.  If recommended by your health care provider, drink red wine in moderation. This means 1 glass a day for nonpregnant women and 2 glasses a day for men. A glass of wine equals 5 oz (150 mL). Reading food labels  Check the serving size of packaged foods. For foods such as rice and pasta, the serving size refers to the amount of cooked product, not dry.  Check the total fat in packaged foods. Avoid foods that have saturated fat or trans fats.  Check the ingredients list for added sugars, such as corn syrup. Shopping  At the grocery store, buy most of your food from the areas near the walls of the store. This includes: ? Fresh fruits and vegetables (produce). ? Grains, beans, nuts, and seeds. Some of these may be available in unpackaged forms or large amounts (in bulk). ? Fresh seafood. ? Poultry and eggs. ? Low-fat dairy products.  Buy whole ingredients instead of prepackaged foods.  Buy fresh fruits and vegetables in-season from local farmers markets.  Buy frozen fruits and vegetables in resealable bags.  If you do not have access to quality fresh seafood, buy precooked frozen shrimp or canned fish, such as tuna, salmon, or sardines.  Buy small amounts of raw or cooked vegetables, salads, or olives from the  deli or salad bar at your store.  Stock your pantry so you always have certain foods on hand, such as olive oil, canned tuna, canned tomatoes, rice, pasta, and beans. Cooking  Cook foods with extra-virgin olive oil instead of using butter or other vegetable oils.  Have meat as a side dish, and have vegetables or grains as your main dish. This means having meat in small portions or adding small amounts of meat to foods like pasta or stew.  Use beans or vegetables instead of meat in common dishes like chili or lasagna.  Experiment with different cooking methods. Try roasting or broiling vegetables instead of steaming or sauteing them.  Add frozen vegetables to soups, stews, pasta, or rice.  Add nuts or seeds for added healthy fat at each meal. You can add these to yogurt, salads, or vegetable dishes.  Marinate fish or vegetables using olive oil, lemon juice, garlic, and fresh herbs. Meal planning  Plan to eat 1 vegetarian meal one day each week. Try to work up to 2 vegetarian meals, if possible.  Eat seafood 2 or more times a week.  Have healthy snacks readily available, such as: ? Vegetable sticks with hummus. ? Greek yogurt. ? Fruit and nut trail mix.  Eat balanced meals throughout the week. This includes: ? Fruit: 2-3 servings a day ? Vegetables: 4-5 servings a day ? Low-fat dairy: 2 servings a day ? Fish, poultry, or lean meat: 1 serving a day ? Beans and legumes: 2 or more servings a week ? Nuts   and seeds: 1-2 servings a day ? Whole grains: 6-8 servings a day ? Extra-virgin olive oil: 3-4 servings a day  Limit red meat and sweets to only a few servings a month What are my food choices?  Mediterranean diet ? Recommended ? Grains: Whole-grain pasta. Brown rice. Bulgar wheat. Polenta. Couscous. Whole-wheat bread. Oatmeal. Quinoa. ? Vegetables: Artichokes. Beets. Broccoli. Cabbage. Carrots. Eggplant. Green beans. Chard. Kale. Spinach. Onions. Leeks. Peas. Squash.  Tomatoes. Peppers. Radishes. ? Fruits: Apples. Apricots. Avocado. Berries. Bananas. Cherries. Dates. Figs. Grapes. Lemons. Melon. Oranges. Peaches. Plums. Pomegranate. ? Meats and other protein foods: Beans. Almonds. Sunflower seeds. Pine nuts. Peanuts. Cod. Salmon. Scallops. Shrimp. Tuna. Tilapia. Clams. Oysters. Eggs. ? Dairy: Low-fat milk. Cheese. Greek yogurt. ? Beverages: Water. Red wine. Herbal tea. ? Fats and oils: Extra virgin olive oil. Avocado oil. Grape seed oil. ? Sweets and desserts: Greek yogurt with honey. Baked apples. Poached pears. Trail mix. ? Seasoning and other foods: Basil. Cilantro. Coriander. Cumin. Mint. Parsley. Sage. Rosemary. Tarragon. Garlic. Oregano. Thyme. Pepper. Balsalmic vinegar. Tahini. Hummus. Tomato sauce. Olives. Mushrooms. ? Limit these ? Grains: Prepackaged pasta or rice dishes. Prepackaged cereal with added sugar. ? Vegetables: Deep fried potatoes (french fries). ? Fruits: Fruit canned in syrup. ? Meats and other protein foods: Beef. Pork. Lamb. Poultry with skin. Hot dogs. Bacon. ? Dairy: Ice cream. Sour cream. Whole milk. ? Beverages: Juice. Sugar-sweetened soft drinks. Beer. Liquor and spirits. ? Fats and oils: Butter. Canola oil. Vegetable oil. Beef fat (tallow). Lard. ? Sweets and desserts: Cookies. Cakes. Pies. Candy. ? Seasoning and other foods: Mayonnaise. Premade sauces and marinades. ? The items listed may not be a complete list. Talk with your dietitian about what dietary choices are right for you. Summary  The Mediterranean diet includes both food and lifestyle choices.  Eat a variety of fresh fruits and vegetables, beans, nuts, seeds, and whole grains.  Limit the amount of red meat and sweets that you eat.  Talk with your health care provider about whether it is safe for you to drink red wine in moderation. This means 1 glass a day for nonpregnant women and 2 glasses a day for men. A glass of wine equals 5 oz (150 mL). This information  is not intended to replace advice given to you by your health care provider. Make sure you discuss any questions you have with your health care provider. Document Released: 03/24/2016 Document Revised: 04/26/2016 Document Reviewed: 03/24/2016 Elsevier Interactive Patient Education  2018 [employer]  

## 2017-10-20 NOTE — Assessment & Plan Note (Signed)
Wt Readings from Last 3 Encounters:  10/20/17 208 lb (94.3 kg)  07/11/17 199 lb (90.3 kg)  01/16/17 207 lb (93.9 kg)

## 2017-10-20 NOTE — Progress Notes (Signed)
Subjective:  Patient ID: [name] [name], male    DOB: February 28, 1956  Age: 63 y.o. MRN: [id number]  CC: No chief complaint on file.   HPI Hymen Arnett Math presents for elevated BP 130-160 Vyvance was reduced to 50 mg/d F/u chronic pain, ED   Outpatient Medications Prior to Visit  Medication Sig Dispense Refill  . aspirin 81 MG tablet Take 81 mg by mouth 2 (two) times daily.     . ciclopirox (PENLAC) 8 % solution Apply topically at bedtime. Apply over nail and surrounding skin. Apply daily over previous coat. After seven (7) days, may remove with alcohol and continue cycle. (Patient taking differently: Apply 1 application topically at bedtime as needed (toe fungus). Apply over nail and surrounding skin. Apply daily over previous coat. After seven (7) days, may remove with alcohol and continue cycle.) 6.6 mL 1  . cyclobenzaprine (FLEXERIL) 10 MG tablet TAKE 1 TABLET THREE TIMES A DAY AS NEEDED FOR MUSCLE SPASMS 90 tablet 1  . diclofenac (VOLTAREN) 75 MG EC tablet Take 75 mg by mouth 3 (three) times daily.    Marland Kitchen DYMISTA 137-50 MCG/ACT SUSP USE 2 SPRAYS IN EACH NOSTRIL AT BEDTIME AS NEEDED 69 g 3  . HYDROcodone-acetaminophen (NORCO/VICODIN) 5-325 MG tablet Take 1-2 tablets by mouth 2 (two) times daily as needed for moderate pain or severe pain. 90 tablet 0  . lisdexamfetamine (VYVANSE) 50 MG capsule Take 1 capsule by mouth daily.    Marland Kitchen lithium carbonate 150 MG capsule Take 750 mg by mouth at bedtime.     . meloxicam (MOBIC) 15 MG tablet TAKE 1 TABLET DAILY (Patient taking differently: TAKE 1 TABLET DAILY as needed) 90 tablet 1  . metoprolol succinate (TOPROL XL) 25 MG 24 hr tablet Take 1 tablet (25 mg total) by mouth daily. Annual appt due in Sept must see provider for future refills 90 tablet 3  . pantoprazole (PROTONIX) 40 MG tablet Take 1 tablet (40 mg total) by mouth daily before breakfast. Annual appt due in Sept must see provider for future refills 90 tablet 3  . Pyridoxine HCl (VITAMIN B-6) 500 MG  tablet Take 500 mg by mouth 2 (two) times daily.    . QUEtiapine (SEROQUEL) 50 MG tablet Take 50-150 mg by mouth at bedtime as needed.     . tadalafil (CIALIS) 5 MG tablet Take 1 tablet (5 mg total) by mouth daily as needed for erectile dysfunction. (Patient taking differently: Take 5 mg by mouth daily. ) 90 tablet 3  . testosterone (ANDROGEL) 50 MG/5GM (1%) GEL Place 10 g onto the skin daily.    . cyclobenzaprine (FLEXERIL) 10 MG tablet TAKE 1 TABLET THREE TIMES A DAY AS NEEDED FOR MUSCLE SPASMS 90 tablet 1  . methocarbamol (ROBAXIN) 750 MG tablet Take 750 mg by mouth 3 (three) times daily as needed for muscle spasms.    Marland Kitchen tiZANidine (ZANAFLEX) 4 MG capsule Take 4 mg by mouth 4 (four) times daily as needed for muscle spasms.     No facility-administered medications prior to visit.     ROS Review of Systems  Constitutional: Negative for appetite change, fatigue and unexpected weight change.  HENT: Negative for congestion, nosebleeds, sneezing, sore throat and trouble swallowing.   Eyes: Negative for itching and visual disturbance.  Respiratory: Negative for cough.   Cardiovascular: Negative for chest pain, palpitations and leg swelling.  Gastrointestinal: Negative for abdominal distention, blood in stool, diarrhea and nausea.  Genitourinary: Negative for frequency and hematuria.  Musculoskeletal: Positive for arthralgias and back pain. Negative for gait problem, joint swelling and neck pain.  Skin: Negative for rash.  Neurological: Negative for dizziness, tremors, speech difficulty and weakness.  Psychiatric/Behavioral: Negative for agitation, dysphoric mood and sleep disturbance. The patient is not nervous/anxious.     Objective:  BP (!) 146/84 (BP Location: Left Arm, Patient Position: Sitting, Cuff Size: Large)   Pulse 69   Temp 98.5 F (36.9 C) (Oral)   Ht 5\' 9"  (1.753 m)   Wt 208 lb (94.3 kg)   SpO2 99%   BMI 30.72 kg/m   BP Readings from Last 3 Encounters:  10/20/17 (!)  146/84  07/11/17 124/72  01/16/17 138/76    Wt Readings from Last 3 Encounters:  10/20/17 208 lb (94.3 kg)  07/11/17 199 lb (90.3 kg)  01/16/17 207 lb (93.9 kg)    Physical Exam  Constitutional: He is oriented to person, place, and time. He appears well-developed. No distress.  NAD  HENT:  Mouth/Throat: Oropharynx is clear and moist.  Eyes: Conjunctivae are normal. Pupils are equal, round, and reactive to light.  Neck: Normal range of motion. No JVD present. No thyromegaly present.  Cardiovascular: Normal rate, regular rhythm, normal heart sounds and intact distal pulses. Exam reveals no gallop and no friction rub.  No murmur heard. Pulmonary/Chest: Effort normal and breath sounds normal. No respiratory distress. He has no wheezes. He has no rales. He exhibits no tenderness.  Abdominal: Soft. Bowel sounds are normal. He exhibits no distension and no mass. There is no tenderness. There is no rebound and no guarding.  Musculoskeletal: Normal range of motion. He exhibits no edema or tenderness.  Lymphadenopathy:    He has no cervical adenopathy.  Neurological: He is alert and oriented to person, place, and time. He has normal reflexes. No cranial nerve deficit. He exhibits normal muscle tone. He displays a negative Romberg sign. Coordination and gait normal.  Skin: Skin is warm and dry. No rash noted.  Psychiatric: He has a normal mood and affect. His behavior is normal. Judgment and thought content normal.    Lab Results  Component Value Date   WBC 8.4 04/26/2017   HGB 15.4 04/26/2017   HCT 47.7 04/26/2017   PLT 293.0 04/26/2017   GLUCOSE 105 (H) 01/16/2017   CHOL 183 05/06/2016   TRIG 114.0 05/06/2016   HDL 38.20 (L) 05/06/2016   LDLDIRECT 77.5 12/26/2008   LDLCALC 122 (H) 05/06/2016   ALT 21 05/06/2016   AST 16 05/06/2016   NA 138 01/16/2017   K 4.3 01/16/2017   CL 104 01/16/2017   CREATININE 1.10 01/16/2017   BUN 17 01/16/2017   CO2 30 01/16/2017   TSH 1.60  05/06/2016   PSA 2.50 05/06/2016   HGBA1C 6.0 07/03/2014    No results found.  Assessment & Plan:   There are no diagnoses linked to this encounter. I have discontinued Youlanda Roys. Petti's tiZANidine and methocarbamol. I am also having him maintain his lisdexamfetamine, ciclopirox, tadalafil, lithium carbonate, QUEtiapine, aspirin, testosterone, DYMISTA, metoprolol succinate, pantoprazole, HYDROcodone-acetaminophen, cyclobenzaprine, meloxicam, vitamin B-6, and diclofenac.  No orders of the defined types were placed in this encounter.    Follow-up: No Follow-up on file.  Walker Kehr, MD

## 2017-10-30 ENCOUNTER — Ambulatory Visit: Payer: Self-pay | Admitting: *Deleted

## 2017-10-30 NOTE — Telephone Encounter (Signed)
Please advise 

## 2017-10-30 NOTE — Telephone Encounter (Signed)
I doubt that the symptoms are related to vitamin B 6 side effects.  Most likely they were related to lithium side effects. Thank you

## 2017-10-30 NOTE — Telephone Encounter (Signed)
  Pt called with symptoms he feels like is coming from  taking larges dose of vitamin B6. He stated that he had been taking 500 mg in the morning and 500 mg in the evening. He stated that he had been doing this for a little less than a month and his psychiatrist had put him on this to help with the lithium that he is taking.  He has shaky hands, tingling in his fingers, blurred vision, weakness, nausea, and abd pain. States he had not been able to get out of bed because of this.  He stopped the vitamin B6 and cut back on several medications: Toprol went from 50 to 25 mg and his lithium went from 750 to 450 and cut out the Vitamin B6 all together. He did this on Friday. Will contact the pcp regarding his symptoms per protocol.  He wants to know how long it will take to get the Vitamin B6 out of his system. Pt is expecting a call back.  Reason for Disposition . Caller has URGENT medication question about med that PCP prescribed and triager unable to answer question  Answer Assessment - Initial Assessment Questions 1. SYMPTOMS: "Do you have any symptoms?"     Blurred vision, weakness, queasy, shaky hands, tingling in fingers 2. SEVERITY: If symptoms are present, ask "Are they mild, moderate or severe?"     Severe  Protocols used: MEDICATION QUESTION CALL-A-AH

## 2017-10-31 NOTE — Telephone Encounter (Signed)
Pt.notified

## 2018-01-23 ENCOUNTER — Other Ambulatory Visit: Payer: Self-pay | Admitting: Internal Medicine

## 2018-02-11 IMAGING — RF DG LUMBAR SPINE 2-3V
1 series · 5 of 5 positions shown · non-contrast
Comparison: 06/18/2016 lumbar spine MRI

CLINICAL DATA: L3-L5 PLIF.

EXAM:
LUMBAR SPINE - 2-3 VIEW; DG C-ARM 61-120 MIN

[Series 1: run · 5 of 5 slices shown]
[im 1/5]
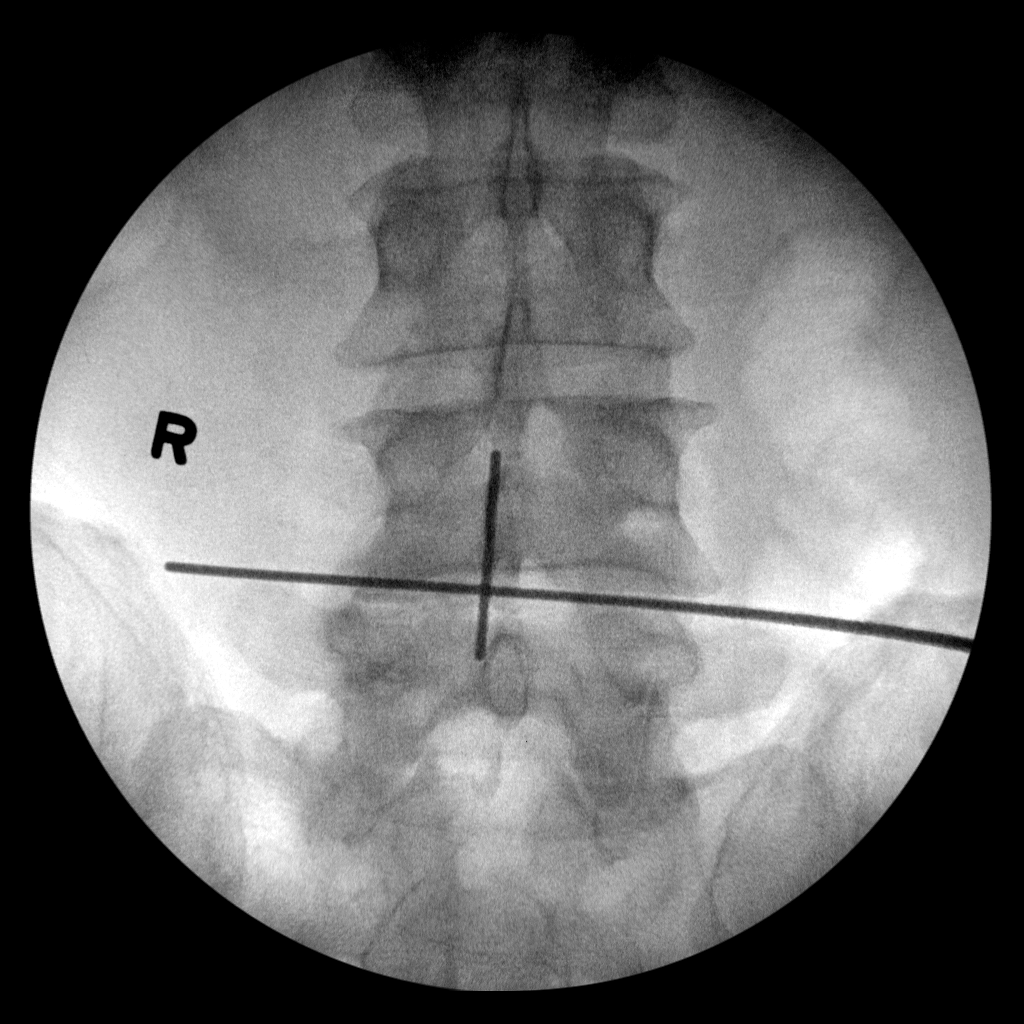
[im 2/5]
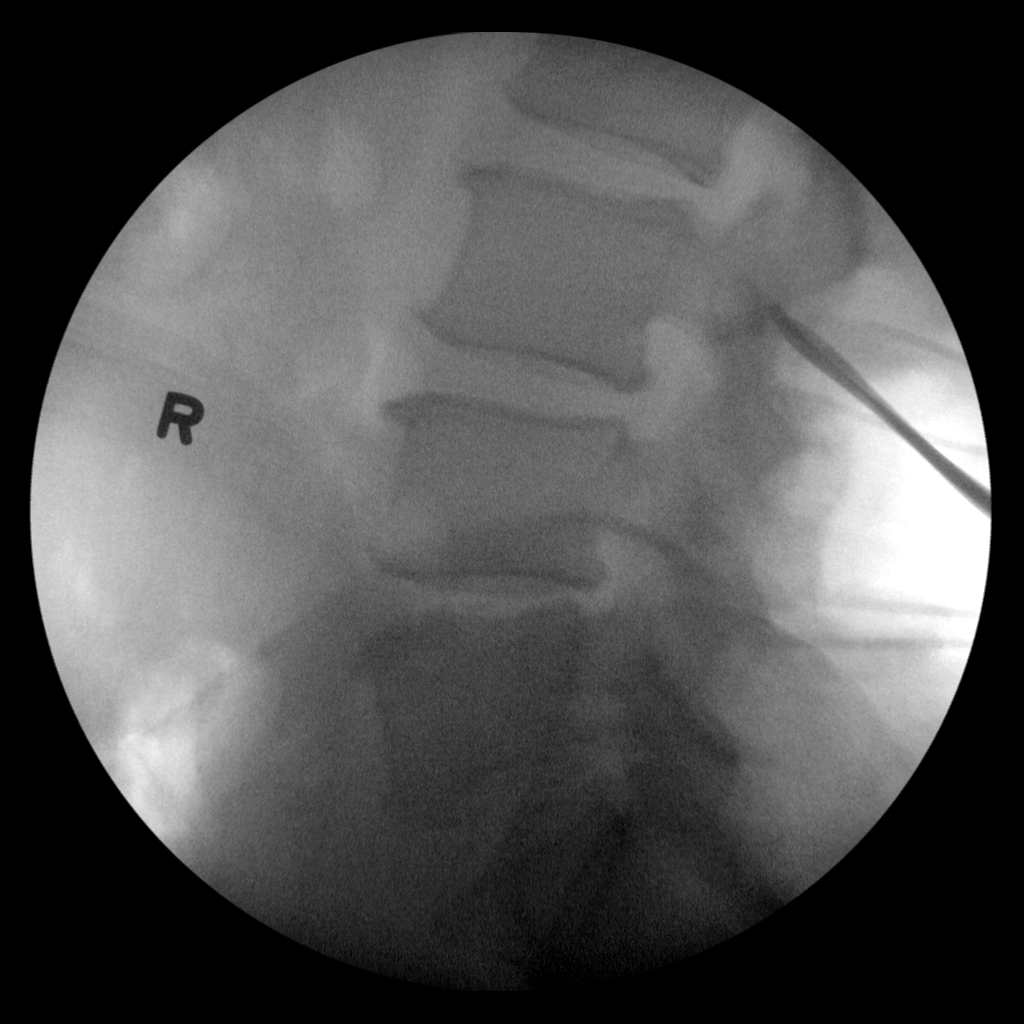
[im 3/5]
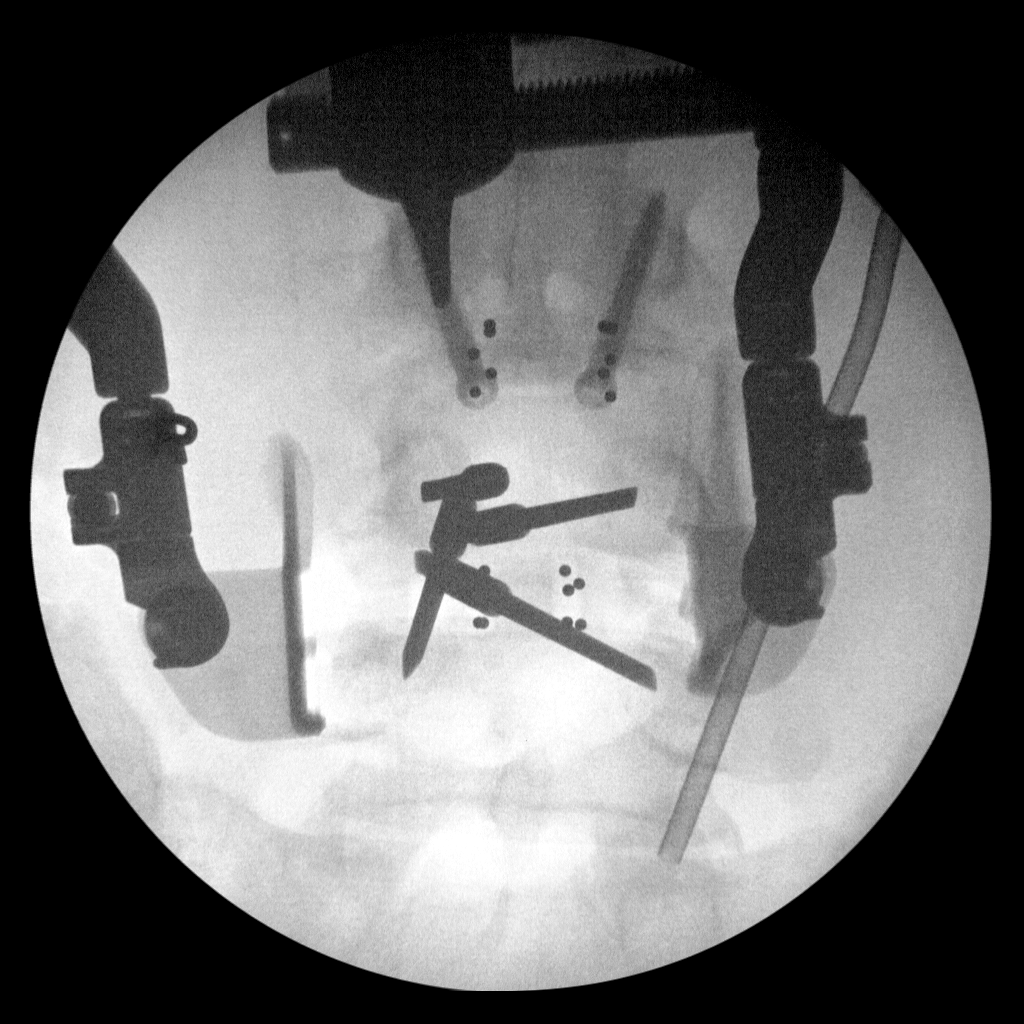
[im 4/5]
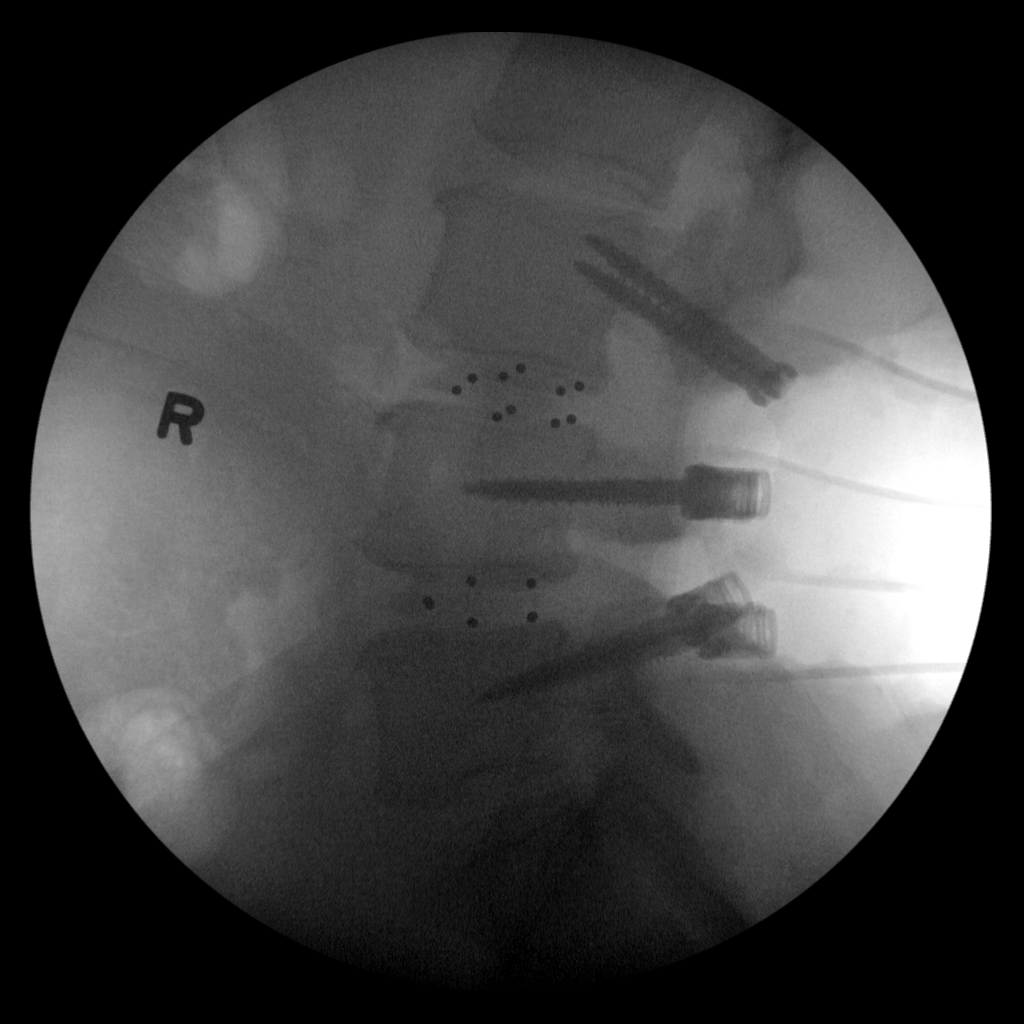
[im 5/5]
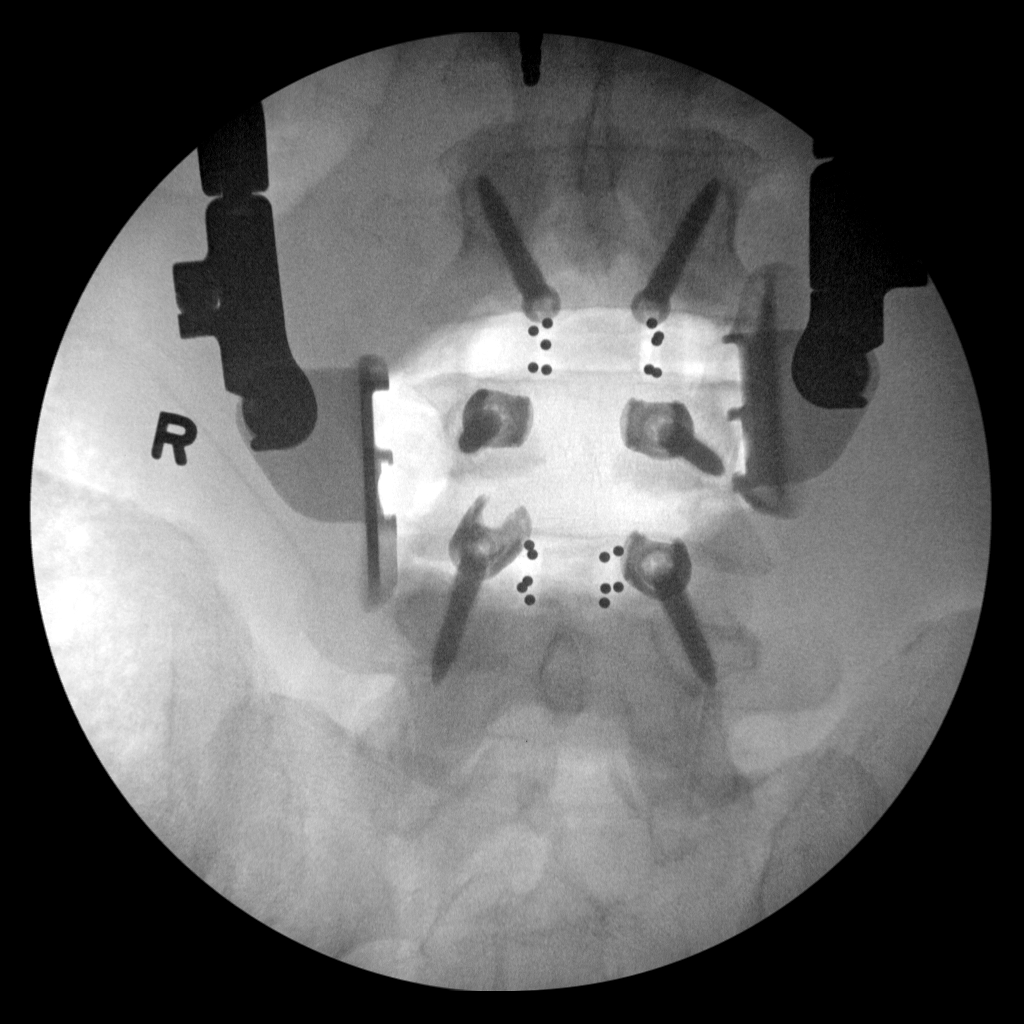

[5 of 5 positions shown; findings below may reference images not displayed]

FINDINGS: Fluoroscopy time 1 minutes 4 seconds. Five nondiagnostic spot
fluoroscopic intraoperative radiographs of the lumbar spine
demonstrate postsurgical changes from bilateral posterior spinal
fusion from L3-L5 with bone cages in the L3-4 and L4-5 disc spaces.
IMPRESSION: Intraoperative fluoroscopic guidance for bilateral posterior spinal
fusion from L3-L5.

## 2018-03-01 ENCOUNTER — Encounter: Payer: Self-pay | Admitting: Internal Medicine

## 2018-03-01 ENCOUNTER — Other Ambulatory Visit (INDEPENDENT_AMBULATORY_CARE_PROVIDER_SITE_OTHER)

## 2018-03-01 ENCOUNTER — Ambulatory Visit (INDEPENDENT_AMBULATORY_CARE_PROVIDER_SITE_OTHER): Admitting: Internal Medicine

## 2018-03-01 VITALS — BP 130/76 | HR 53 | Temp 98.0°F | Ht 69.0 in | Wt 215.0 lb

## 2018-03-01 DIAGNOSIS — F4323 Adjustment disorder with mixed anxiety and depressed mood: Secondary | ICD-10-CM

## 2018-03-01 DIAGNOSIS — G8929 Other chronic pain: Secondary | ICD-10-CM

## 2018-03-01 DIAGNOSIS — I10 Essential (primary) hypertension: Secondary | ICD-10-CM

## 2018-03-01 DIAGNOSIS — J300 Vasomotor rhinitis: Secondary | ICD-10-CM

## 2018-03-01 DIAGNOSIS — M545 Low back pain: Secondary | ICD-10-CM

## 2018-03-01 DIAGNOSIS — D751 Secondary polycythemia: Secondary | ICD-10-CM

## 2018-03-01 LAB — CBC WITH DIFFERENTIAL/PLATELET
BASOS PCT: 0.4 % (ref 0.0–3.0)
Basophils Absolute: 0 10*3/uL (ref 0.0–0.1)
EOS PCT: 6 % — AB (ref 0.0–5.0)
Eosinophils Absolute: 0.2 10*3/uL (ref 0.0–0.7)
HCT: 44.3 % (ref 39.0–52.0)
HEMOGLOBIN: 14.5 g/dL (ref 13.0–17.0)
LYMPHS ABS: 1.2 10*3/uL (ref 0.7–4.0)
Lymphocytes Relative: 29.4 % (ref 12.0–46.0)
MCHC: 32.7 g/dL (ref 30.0–36.0)
MCV: 87 fl (ref 78.0–100.0)
MONOS PCT: 13.5 % — AB (ref 3.0–12.0)
Monocytes Absolute: 0.6 10*3/uL (ref 0.1–1.0)
NEUTROS PCT: 50.7 % (ref 43.0–77.0)
Neutro Abs: 2.1 10*3/uL (ref 1.4–7.7)
Platelets: 247 10*3/uL (ref 150.0–400.0)
RBC: 5.09 Mil/uL (ref 4.22–5.81)
RDW: 17.5 % — AB (ref 11.5–15.5)
WBC: 4.2 10*3/uL (ref 4.0–10.5)

## 2018-03-01 MED ORDER — DEXLANSOPRAZOLE 60 MG PO CPDR: 60.0000 mg | DELAYED_RELEASE_CAPSULE | Freq: Every day | ORAL | 3 refills | Status: DC

## 2018-03-01 MED ORDER — DYMISTA 137-50 MCG/ACT NA SUSP: NASAL | 3 refills | Status: AC

## 2018-03-01 MED ORDER — HYDROCODONE-ACETAMINOPHEN 5-325 MG PO TABS: 1.0000 | ORAL_TABLET | Freq: Two times a day (BID) | ORAL | 0 refills | Status: DC | PRN

## 2018-03-01 MED ORDER — CICLOPIROX 8 % EX SOLN: 1.0000 "application " | Freq: Every evening | CUTANEOUS | 1 refills | Status: AC | PRN

## 2018-03-01 MED ORDER — CYCLOBENZAPRINE HCL 10 MG PO TABS: ORAL_TABLET | ORAL | 1 refills | Status: DC

## 2018-03-01 NOTE — Assessment & Plan Note (Signed)
Toprol 

## 2018-03-01 NOTE — Patient Instructions (Signed)
Foam roller  Body pillow

## 2018-03-01 NOTE — Assessment & Plan Note (Signed)
Dymista

## 2018-03-01 NOTE — Addendum Note (Signed)
Addended by: Karren Cobble on: 03/01/2018 10:52 AM   Modules accepted: Orders

## 2018-03-01 NOTE — Progress Notes (Signed)
Subjective:  Patient ID: [name] [name], male    DOB: 08-27-1955  Age: 62 y.o. MRN: [id number]  CC: No chief complaint on file.   HPI [name] [name] presents for LBP - seeing Dr Vertell Limber and he is in PT. C/o worsening LBP, glut pain is going down his legs F/u HTN, chronic pain, depression Recent ruptured L TM  Outpatient Medications Prior to Visit  Medication Sig Dispense Refill  . aspirin 81 MG tablet Take 81 mg by mouth 2 (two) times daily.     . ciclopirox (PENLAC) 8 % solution Apply topically at bedtime. Apply over nail and surrounding skin. Apply daily over previous coat. After seven (7) days, may remove with alcohol and continue cycle. (Patient taking differently: Apply 1 application topically at bedtime as needed (toe fungus). Apply over nail and surrounding skin. Apply daily over previous coat. After seven (7) days, may remove with alcohol and continue cycle.) 6.6 mL 1  . cyclobenzaprine (FLEXERIL) 10 MG tablet TAKE 1 TABLET THREE TIMES A DAY AS NEEDED FOR MUSCLE SPASMS 90 tablet 1  . DYMISTA 137-50 MCG/ACT SUSP USE 2 SPRAYS IN EACH NOSTRIL AT BEDTIME AS NEEDED 69 g 3  . HYDROcodone-acetaminophen (NORCO/VICODIN) 5-325 MG tablet Take 1-2 tablets by mouth 2 (two) times daily as needed for moderate pain or severe pain. 90 tablet 0  . metoprolol succinate (TOPROL-XL) 25 MG 24 hr tablet TAKE 1 TABLET DAILY (ANNUAL APPOINTMENT DUE IN [place], MUST SEE PROVIDER FOR FUTURE REFILLS) 90 tablet 3  . pantoprazole (PROTONIX) 40 MG tablet Take 1 tablet (40 mg total) by mouth daily before breakfast. Annual appt due in Sept must see provider for future refills 90 tablet 3  . QUEtiapine (SEROQUEL) 50 MG tablet Take 50-150 mg by mouth at bedtime as needed.     . tadalafil (CIALIS) 5 MG tablet Take 1 tablet (5 mg total) by mouth daily as needed for erectile dysfunction. (Patient taking differently: Take 5 mg by mouth daily. ) 90 tablet 3  . testosterone (ANDROGEL) 50 MG/5GM (1%) GEL Place 10 g onto the skin  daily.    . diclofenac (VOLTAREN) 75 MG EC tablet Take 75 mg by mouth 3 (three) times daily.    Marland Kitchen lisdexamfetamine (VYVANSE) 50 MG capsule Take 1 capsule by mouth daily.    Marland Kitchen lithium carbonate 150 MG capsule Take 750 mg by mouth at bedtime.     . metoprolol succinate (TOPROL XL) 25 MG 24 hr tablet Take 1 tablet (25 mg total) by mouth 2 (two) times daily. (Patient not taking: Reported on 03/01/2018) 180 tablet 3  . Pyridoxine HCl (VITAMIN B-6) 500 MG tablet Take 500 mg by mouth 2 (two) times daily.     No facility-administered medications prior to visit.     ROS: Review of Systems  Constitutional: Positive for fatigue. Negative for appetite change and unexpected weight change.  HENT: Positive for ear pain. Negative for congestion, nosebleeds, sneezing, sore throat and trouble swallowing.   Eyes: Negative for itching and visual disturbance.  Respiratory: Negative for cough.   Cardiovascular: Negative for chest pain, palpitations and leg swelling.  Gastrointestinal: Negative for abdominal distention, blood in stool, diarrhea and nausea.  Genitourinary: Negative for frequency and hematuria.  Musculoskeletal: Positive for arthralgias, back pain, gait problem and neck pain. Negative for joint swelling.  Skin: Negative for rash.  Neurological: Negative for dizziness, tremors, speech difficulty and weakness.  Psychiatric/Behavioral: Positive for dysphoric mood. Negative for agitation, sleep disturbance and suicidal ideas.  The patient is nervous/anxious.     Objective:  BP 130/76 (BP Location: Left Arm, Patient Position: Sitting, Cuff Size: Large)   Pulse (!) 53   Temp 98 F (36.7 C) (Oral)   Ht 5\' 9"  (1.753 m)   Wt 215 lb (97.5 kg)   SpO2 96%   BMI 31.75 kg/m   BP Readings from Last 3 Encounters:  03/01/18 130/76  10/20/17 (!) 146/84  07/11/17 124/72    Wt Readings from Last 3 Encounters:  03/01/18 215 lb (97.5 kg)  10/20/17 208 lb (94.3 kg)  07/11/17 199 lb (90.3 kg)     Physical Exam  Constitutional: He is oriented to person, place, and time. He appears well-developed. No distress.  NAD  HENT:  Mouth/Throat: Oropharynx is clear and moist.  Eyes: Pupils are equal, round, and reactive to light. Conjunctivae are normal.  Neck: Normal range of motion. No JVD present. No thyromegaly present.  Cardiovascular: Normal rate, regular rhythm, normal heart sounds and intact distal pulses. Exam reveals no gallop and no friction rub.  No murmur heard. Pulmonary/Chest: Effort normal and breath sounds normal. No respiratory distress. He has no wheezes. He has no rales. He exhibits no tenderness.  Abdominal: Soft. Bowel sounds are normal. He exhibits no distension and no mass. There is no tenderness. There is no rebound and no guarding.  Musculoskeletal: Normal range of motion. He exhibits tenderness. He exhibits no edema.  Lymphadenopathy:    He has no cervical adenopathy.  Neurological: He is alert and oriented to person, place, and time. He has normal reflexes. No cranial nerve deficit. He exhibits normal muscle tone. He displays a negative Romberg sign. Coordination and gait normal.  Skin: Skin is warm and dry. No rash noted.  Psychiatric: He has a normal mood and affect. His behavior is normal. Judgment and thought content normal.  LS tender L TM w/perforation  Lab Results  Component Value Date   WBC 5.1 10/20/2017   HGB 15.5 10/20/2017   HCT 47.2 10/20/2017   PLT 256.0 10/20/2017   GLUCOSE 109 (H) 10/20/2017   CHOL 182 10/20/2017   TRIG 103.0 10/20/2017   HDL 42.50 10/20/2017   LDLDIRECT 77.5 12/26/2008   LDLCALC 119 (H) 10/20/2017   ALT 24 10/20/2017   AST 21 10/20/2017   NA 135 10/20/2017   K 4.5 10/20/2017   CL 102 10/20/2017   CREATININE 1.12 10/20/2017   BUN 29 (H) 10/20/2017   CO2 25 10/20/2017   TSH 2.43 10/20/2017   PSA 3.00 10/20/2017   HGBA1C 6.0 07/03/2014    No results found.  Assessment & Plan:   There are no diagnoses linked  to this encounter.   No orders of the defined types were placed in this encounter.    Follow-up: No follow-ups on file.  Walker Kehr, MD

## 2018-03-01 NOTE — Assessment & Plan Note (Signed)
Clonazepam prn, Seroquel  Potential benefits of a long term benzodiazepines  use as well as potential risks  and complications were explained to the patient and were aknowledged.

## 2018-03-01 NOTE — Assessment & Plan Note (Signed)
F/u w/Dr Vertell Limber Chronic - worse Norco prn  Potential benefits of a long term opioids use as well as potential risks (i.e. addiction risk, apnea etc) and complications (i.e. Somnolence, constipation and others) were explained to the patient and were aknowledged.

## 2018-04-27 ENCOUNTER — Ambulatory Visit (INDEPENDENT_AMBULATORY_CARE_PROVIDER_SITE_OTHER): Admitting: Nurse Practitioner

## 2018-04-27 ENCOUNTER — Encounter: Payer: Self-pay | Admitting: Nurse Practitioner

## 2018-04-27 VITALS — BP 140/80 | HR 66 | Ht 69.0 in | Wt 215.0 lb

## 2018-04-27 DIAGNOSIS — T7840XA Allergy, unspecified, initial encounter: Secondary | ICD-10-CM

## 2018-04-27 MED ORDER — METHYLPREDNISOLONE 4 MG PO TBPK: ORAL_TABLET | ORAL | 0 refills | Status: DC

## 2018-04-27 NOTE — Progress Notes (Signed)
Name: [name] [name]   MRN: [id number]    DOB: 1955-12-09   Date:04/27/2018       Progress Note  Subjective  Chief Complaint Rash  HPI  [name] [name] is here today requesting evaluation of an acute complaint of rash. He first noticed the rash to his right forearm 2 days ago, then started to notice same rash on both shoulders yesterday and now on left forearm today. He describes the rash as itchy and irritated, the rash on right arm is mildly painful. He denies any new foods, medications, products. He says hes not recently been outdoors. He checked his house for mosquitos and bugs but didn't see anything He denies fevers, chills, weakness, oral swelling, shortness of breath. hes not tried anything at home for the rash.  Patient Active Problem List   Diagnosis Date Noted  . Bradycardia, sinus 09/16/2016  . 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 Surgery, elective 09/15/2016  . 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 Sinusitis, chronic 07/06/2015  . Plantar fasciitis of left foot 07/06/2015  . Acute bronchitis 04/07/2015  . Obesity 03/04/2015  . Rhinitis 01/20/2015  . Bladder neck obstruction 11/28/2014  . Midsternal chest pain 06/18/2014  . Essential hypertension 06/18/2014  . Dry mouth 03/06/2014  . Well adult exam 03/06/2014  . Articular cartilage disorder, shoulder region 11/19/2013  . Stressful life event affecting family 03/31/2013  . 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 Adjustment disorder with mixed anxiety and depressed mood 10/04/2010  . DYSPHAGIA 07/20/2010  . PERSONAL HX COLONIC POLYPS 07/20/2010  . 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 CHANGE IN BOWELS 09/10/2008  . Obstructive sleep apnea 02/28/2008  . NEOPLASM UNCERTAIN BHV CNCTV&OTH SOFT TISSUE 02/27/2008  . BPH (benign prostatic hyperplasia) 02/27/2008  . Dyslipidemia 11/27/2007  . Bipolar I disorder ([place]) 07/11/2007  . Osteoarthritis 07/11/2007  . Low back pain 07/11/2007  . Esophageal reflux 07/02/2007     Social History   Tobacco Use  . Smoking status: Never Smoker  . Smokeless tobacco: Never Used  Substance Use Topics  . Alcohol use: Yes    Comment: occasionally     Current Outpatient Medications:  .  aspirin 81 MG tablet, Take 81 mg by mouth 2 (two) times daily. , Disp: , Rfl:  .  ciclopirox (PENLAC) 8 % solution, Apply 1 application topically at bedtime as needed (toe fungus). Apply over nail and surrounding skin. Apply daily over previous coat. After seven (7) days, may remove with alcohol and continue cycle., Disp: 6 mL, Rfl: 1 .  cyclobenzaprine (FLEXERIL) 10 MG tablet, TAKE 1 TABLET THREE TIMES A DAY AS NEEDED FOR MUSCLE SPASMS, Disp: 90 tablet, Rfl: 1 .  dexlansoprazole (DEXILANT) 60 MG capsule, Take 1 capsule (60 mg total) by mouth daily., Disp: 90 capsule, Rfl: 3 .  DYMISTA 137-50 MCG/ACT SUSP, USE 2 SPRAYS IN EACH NOSTRIL AT BEDTIME AS NEEDED, Disp: 69 g, Rfl: 3 .  HYDROcodone-acetaminophen (NORCO/VICODIN) 5-325 MG tablet, Take 1-2 tablets by mouth 2 (two) times daily as needed for moderate pain or severe pain., Disp: 90 tablet, Rfl: 0 .  metoprolol succinate (TOPROL-XL) 25 MG 24 hr tablet, TAKE 1 TABLET DAILY (ANNUAL APPOINTMENT DUE IN [place], MUST SEE PROVIDER FOR FUTURE REFILLS), Disp: 90 tablet, Rfl: 3 .  tadalafil (CIALIS) 5 MG tablet, Take 1 tablet (5 mg total) by mouth daily as needed for erectile dysfunction. (Patient taking differently: Take 5 mg by mouth daily. ), Disp: 90 tablet, Rfl: 3 .  testosterone (ANDROGEL) 50 MG/5GM (  1%) GEL, Place 10 g onto the skin daily., Disp: , Rfl:  .  amphetamine-dextroamphetamine (ADDERALL XR) 10 MG 24 hr capsule, Take by mouth as needed., Disp: , Rfl:  .  QUEtiapine (SEROQUEL) 50 MG tablet, Take 50-150 mg by mouth at bedtime as needed. , Disp: , Rfl:   Allergies  Allergen Reactions  . Amoxicillin-Pot Clavulanate Other (See Comments)  . Ciprofloxacin-Dexamethasone Rash    REDNESS AND IRRITATION  . Propofol Rash     ROS   No other specific complaints in a complete review of systems (except as listed in HPI above).  Objective  Vitals:   04/27/18 1442  BP: 140/80  Pulse: 66  SpO2: 96%  Weight: 215 lb (97.5 kg)  Height: 5\' 9"  (1.753 m)    Body mass index is 31.75 kg/m.  Nursing Note and Vital Signs reviewed.  Physical Exam  Constitutional: Patient appears well-developed and well-nourished. No distress.  HEENT: head atraumatic, normocephalic, pupils equal and reactive to light, EOM's intact,  neck supple,  oropharynx pink and moist without exudate Cardiovascular: Normal rate, regular rhythm, distal pulses intact. Pulmonary/Chest: Effort normal. No respiratory distress or retractions. Musculoskeletal: Normal range of motion,  No gross deformities Neurological: He is alert and oriented to person, place, and time. No cranial nerve deficit. Coordination, balance, strength, speech and gait are normal.  Skin: Skin is warm and dry. Erythematous rash scattered to right forearm, bilateral shoulders, left arm. Psychiatric: Patient has a normal mood and affect. behavior is normal. Judgment and thought content normal.  Assessment & Plan  1. Allergic reaction, initial encounter Unknown cause of rash, could be some type of allergic reaction or dermatitis Will start oral steroid course- dosing and side effects discussed. He has a documented allergy to ciprodex, he says this was local reaction to drops in his ears, no anaphylaxis or systemic reaction Clariitn, benadryl OTC for symptoms -home management, Red flags and when to present for emergency care or RTC including fever >101.86F, chest pain, shortness of breath, new/worsening/un-resolving symptoms, reviewed with patient at time of visit. Follow up and care instructions discussed and provided in AVS. - methylPREDNISolone (MEDROL) 4 MG TBPK tablet; 6 tabs today, 5 tabs day 2, 4 tabs day 3, 3 tabs day 4, 2 tabs day 5, 1 tab day 6. Take w/food.   Dispense: 21 tablet; Refill: 0

## 2018-04-27 NOTE — Patient Instructions (Signed)
I have sent medrol dose pack to your pharmacy as discussed.  Benadryl, claritin OTC to help with itching  Please follow up for fevers >101, worsening symptoms, if symptoms persist after steroid course.   Allergies An allergy is when your body reacts to a substance in a way that is not normal. An allergic reaction can happen after you:  Eat something.  Breathe in something.  Touch something.  You can be allergic to:  Things that are only around during certain seasons, like molds and pollens.  Foods.  Drugs.  Insects.  Animal dander.  What are the signs or symptoms?  Puffiness (swelling). This may happen on the lips, face, tongue, mouth, or throat.  Sneezing.  Coughing.  Breathing loudly (wheezing).  Stuffy nose.  Tingling in the mouth.  A rash.  Itching.  Itchy, red, puffy areas of skin (hives).  Watery eyes.  Throwing up (vomiting).  Watery poop (diarrhea).  Dizziness.  Feeling faint or fainting.  Trouble breathing or swallowing.  A tight feeling in the chest.  A fast heartbeat. How is this diagnosed? Allergies can be diagnosed with:  A medical and family history.  Skin tests.  Blood tests.  A food diary. A food diary is a record of all the foods, drinks, and symptoms you have each day.  The results of an elimination diet. This diet involves making sure not to eat certain foods and then seeing what happens when you start eating them again.  How is this treated? There is no cure for allergies, but allergic reactions can be treated with medicine. Severe reactions usually need to be treated at a hospital. How is this prevented? The best way to prevent an allergic reaction is to avoid the thing you are allergic to. Allergy shots and medicines can also help prevent reactions in some cases. This information is not intended to replace advice given to you by your health care provider. Make sure you discuss any questions you have with your health  care provider. Document Released: 11/26/2012 Document Revised: 03/28/2016 Document Reviewed: 05/13/2014 Elsevier Interactive Patient Education  [employer].

## 2018-05-01 ENCOUNTER — Other Ambulatory Visit: Payer: Self-pay | Admitting: Internal Medicine

## 2018-05-04 ENCOUNTER — Telehealth: Payer: Self-pay | Admitting: Internal Medicine

## 2018-05-04 NOTE — Telephone Encounter (Signed)
Pt called back to hopefully get some advisement; contact if possible

## 2018-05-04 NOTE — Telephone Encounter (Signed)
Please advise 

## 2018-05-04 NOTE — Telephone Encounter (Signed)
Copied from [place] [id number]. Topic: Quick Communication - See Telephone Encounter >> May 04, 2018  1:53 PM [place] N[place], [place] wrote: CRM for notification. See Telephone encounter for: 05/04/18. Patient calling and states that he still has a rash. States that he is itching all over, even in places that he does not have a rash. Did finish the steroids. Did get better on arms. Is now itching on stomach and back. Would like to know what else he can do to to stop the itching and spreading?  [place], [place] AT [place]

## 2018-05-07 NOTE — Telephone Encounter (Signed)
OV if not better Thx 

## 2018-05-07 NOTE — Telephone Encounter (Signed)
Pt needs OV, please call to schedule

## 2018-05-07 NOTE — Telephone Encounter (Signed)
Appt made

## 2018-05-08 ENCOUNTER — Ambulatory Visit (INDEPENDENT_AMBULATORY_CARE_PROVIDER_SITE_OTHER): Admitting: Family

## 2018-05-08 ENCOUNTER — Encounter: Payer: Self-pay | Admitting: Family

## 2018-05-08 DIAGNOSIS — T7840XA Allergy, unspecified, initial encounter: Secondary | ICD-10-CM

## 2018-05-08 MED ORDER — HYDROXYZINE HCL 10 MG PO TABS: 10.0000 mg | ORAL_TABLET | Freq: Three times a day (TID) | ORAL | 0 refills | Status: DC | PRN

## 2018-05-08 MED ORDER — METHYLPREDNISOLONE 4 MG PO TBPK: ORAL_TABLET | ORAL | 0 refills | Status: DC

## 2018-05-08 NOTE — Progress Notes (Signed)
[name] [name] is a 62 y.o. male with the following history as recorded in [employer]:  Patient Active Problem List   Diagnosis Date Noted  . Bradycardia, sinus 09/16/2016  . 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 Surgery, elective 09/15/2016  . 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 Sinusitis, chronic 07/06/2015  . Plantar fasciitis of left foot 07/06/2015  . Acute bronchitis 04/07/2015  . Obesity 03/04/2015  . Rhinitis 01/20/2015  . Bladder neck obstruction 11/28/2014  . Midsternal chest pain 06/18/2014  . Essential hypertension 06/18/2014  . Dry mouth 03/06/2014  . Well adult exam 03/06/2014  . Articular cartilage disorder, shoulder region 11/19/2013  . Stressful life event affecting family 03/31/2013  . 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 Adjustment disorder with mixed anxiety and depressed mood 10/04/2010  . DYSPHAGIA 07/20/2010  . PERSONAL HX COLONIC POLYPS 07/20/2010  . 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 CHANGE IN BOWELS 09/10/2008  . Obstructive sleep apnea 02/28/2008  . NEOPLASM UNCERTAIN BHV CNCTV&OTH SOFT TISSUE 02/27/2008  . BPH (benign prostatic hyperplasia) 02/27/2008  . Dyslipidemia 11/27/2007  . Bipolar I disorder ([place]) 07/11/2007  . Osteoarthritis 07/11/2007  . Low back pain 07/11/2007  . Esophageal reflux 07/02/2007    Current Outpatient Medications  Medication Sig Dispense Refill  . amphetamine-dextroamphetamine (ADDERALL XR) 10 MG 24 hr capsule Take by mouth as needed.    Marland Kitchen aspirin 81 MG tablet Take 81 mg by mouth 2 (two) times daily.     . ciclopirox (PENLAC) 8 % solution Apply 1 application topically at bedtime as needed (toe fungus). Apply over nail and surrounding skin. Apply daily over previous coat. After seven (7) days, may remove with alcohol and continue cycle. 6 mL 1  . cyclobenzaprine (FLEXERIL) 10 MG tablet TAKE 1 TABLET THREE TIMES A DAY AS NEEDED FOR MUSCLE SPASMS 90 tablet 1  . dexlansoprazole (DEXILANT) 60 MG capsule Take 1  capsule (60 mg total) by mouth daily. 90 capsule 3  . DYMISTA 137-50 MCG/ACT SUSP USE 2 SPRAYS IN EACH NOSTRIL AT BEDTIME AS NEEDED 69 g 3  . HYDROcodone-acetaminophen (NORCO/VICODIN) 5-325 MG tablet Take 1-2 tablets by mouth 2 (two) times daily as needed for moderate pain or severe pain. 90 tablet 0  . methylPREDNISolone (MEDROL) 4 MG TBPK tablet 6 tabs today, 5 tabs day 2, 4 tabs day 3, 3 tabs day 4, 2 tabs day 5, 1 tab day 6. Take w/food. 21 tablet 0  . metoprolol succinate (TOPROL-XL) 25 MG 24 hr tablet TAKE 1 TABLET DAILY (ANNUAL APPOINTMENT DUE IN [place], MUST SEE PROVIDER FOR FUTURE REFILLS) 90 tablet 3  . QUEtiapine (SEROQUEL) 50 MG tablet Take 50-150 mg by mouth at bedtime as needed.     . tadalafil (CIALIS) 5 MG tablet Take 1 tablet (5 mg total) by mouth daily as needed for erectile dysfunction. (Patient taking differently: Take 5 mg by mouth daily. ) 90 tablet 3  . testosterone (ANDROGEL) 50 MG/5GM (1%) GEL Place 10 g onto the skin daily.    . hydrOXYzine (ATARAX/VISTARIL) 10 MG tablet Take 1 tablet (10 mg total) by mouth 3 (three) times daily as needed for itching. 30 tablet 0   No current facility-administered medications for this visit.     Allergies: Amoxicillin-pot clavulanate; Ciprofloxacin-dexamethasone; and Propofol  Past Medical History:  Diagnosis Date  . ADD (attention deficit disorder)    takes Adderall and Vyvanse daily  . Anxiety   . Arthritis    shoulders  . Bipolar depression ([place])  takes Wellbutrin daily  . Carpal tunnel syndrome    right  . Chronic back pain    spinal stenosis  . Complication of anesthesia    proprofol caused rash  . Dyspnea    with exertion occasionally  . Enlarged prostate   . GERD (gastroesophageal reflux disease)    takes Pantoprazole daily  . History of bronchitis   . History of colon polyps    benign  . History of gastric ulcer 1985  . Hypertension    takes Metoprolol nightly  . Muscle spasm    takes Flexeril as needed   . Restless leg    occasionally  . Sciatic nerve pain   . Sleep apnea    uses CPAP nightly  . Weakness    numbness in right hand and legs     Past Surgical History:  Procedure Laterality Date  . APPENDECTOMY  1978  . CARPAL TUNNEL RELEASE Right 09/15/2016   Procedure: CARPAL TUNNEL RELEASE-RIGHT;  Surgeon: Erline Levine, MD;  Location: [facility];  Service: Neurosurgery;  Laterality: Right;  CARPAL TUNNEL RELEASE-RIGHT  . COLONOSCOPY     multiple  . ESOPHAGOGASTRODUODENOSCOPY     multiple with dilitation  . GANGLION CYST EXCISION  1990   right elbow, left ankle - separate surgeries  . HERNIA REPAIR     inguinal  . LASIK    . left carpal tunnel   end of 2017  . left shoulder surgery  2013  . MAXIMUM ACCESS (MAS)POSTERIOR LUMBAR INTERBODY FUSION (PLIF) 2 LEVEL N/A 09/15/2016   Procedure: LUMBAR THREE-FOUR, LUMBAR FOUR-FIVE  MAXIMUM ACCESS POSTERIOR LUMBAR INTERBODY FUSION;  Surgeon: Erline Levine, MD;  Location: [facility];  Service: Neurosurgery;  Laterality: N/A;  L3-4 L4-5 MAXIMUM ACCESS POSTERIOR LUMBAR INTERBODY FUSION  . NASAL SEPTOPLASTY W/ TURBINOPLASTY  01/04/2007   inferior turb. reduction  . PROSTATE SURGERY  10/01/2008   transurethral incision prostate  . SHOULDER ARTHROSCOPY WITH SUBACROMIAL DECOMPRESSION Right 11/19/2013   Procedure: RIGHT SHOULDER ARTHROSCOPY WITH SUBACROMIAL DECOMPRESSION/DISTAL CLAVICAL RESECTION/REPAIR SUPRASPINATUS INFRASPINATUS;  Surgeon: Cammie Sickle., MD;  Location: [facility];  Service: Orthopedics;  Laterality: Right;  ANESTHESIA: GENERAL/REGIONAL BLOCK  Procedure SAF DCR Bicepts tenotomy Grade 3 slap Debridment  . SHOULDER SURGERY  08/21/2003 - left   1995 - right x 3  . TRANSURETHRAL RESECTION OF PROSTATE    . ULNAR NERVE REPAIR  03/10/2006   decompression left ulnar nerve  . VASECTOMY      Family History  Problem Relation Age of Onset  . Lung cancer Father   . Coronary artery disease Father   . Diabetes Other   . Asthma Other   .  Coronary artery disease Other   . Coronary artery disease Paternal Grandfather     Social History   Tobacco Use  . Smoking status: Never Smoker  . Smokeless tobacco: Never Used  Substance Use Topics  . Alcohol use: Yes    Comment: occasionally    Subjective:  Was treated for allergic reaction last week on upper body with Medrol Dose pak; notes that symptoms seemed to be improving initially but symptoms then re-flared toward the end of the pack; starting itching "all over" by the end of last week; symptoms seemed to start a couple of days after doing yardwork- ? Exposure to poison ivy;    Objective:  Vitals:   05/08/18 1303  BP: 140/72  Pulse: (!) 58  Temp: 98.3 F (36.8 C)  TempSrc: Oral  SpO2: 96%  Weight: 210 lb 9.6 oz (95.5 kg)  Height: 5\' 9"  (1.753 m)    General: Well developed, well nourished, in no acute distress  Skin : Warm and dry. Erythema noted on back;  Head: Normocephalic and atraumatic  Lungs: Respirations unlabored; clear to auscultation bilaterally without wheeze, rales, rhonchi  Musculoskeletal: No deformities; no active joint inflammation  Extremities: No edema, cyanosis, clubbing  Vessels: Symmetric bilaterally  Neurologic: Alert and oriented; speech intact; face symmetrical; moves all extremities well; CNII-XII intact without focal deficit   Assessment:  1. Allergic reaction, initial encounter     Plan:  Suspect symptoms related to exposure to poison ivy; suspect needs extended treatment with Medrol; Rx for Medrol Dose pak #1 take as directed; Rx for Atarax 10 mg tid prn; discussed use of short-term H2 blocker as well- patient defers due to use of Dexilant; follow-up worse, no better. If symptoms persist, will need labs;  Encouraged to follow-up with his PCP to discuss medications for chronic needs.   Return for Dr. Alain Marion.  No orders of the defined types were placed in this encounter.   Requested Prescriptions   Signed Prescriptions Disp  Refills  . methylPREDNISolone (MEDROL) 4 MG TBPK tablet 21 tablet 0    Sig: 6 tabs today, 5 tabs day 2, 4 tabs day 3, 3 tabs day 4, 2 tabs day 5, 1 tab day 6. Take w/food.  . hydrOXYzine (ATARAX/VISTARIL) 10 MG tablet 30 tablet 0    Sig: Take 1 tablet (10 mg total) by mouth 3 (three) times daily as needed for itching.

## 2018-06-22 ENCOUNTER — Ambulatory Visit (INDEPENDENT_AMBULATORY_CARE_PROVIDER_SITE_OTHER): Admitting: Internal Medicine

## 2018-06-22 ENCOUNTER — Encounter: Payer: Self-pay | Admitting: Internal Medicine

## 2018-06-22 VITALS — BP 132/74 | HR 49 | Temp 97.7°F | Ht 69.0 in | Wt 216.0 lb

## 2018-06-22 DIAGNOSIS — M545 Low back pain: Secondary | ICD-10-CM | POA: Diagnosis not present

## 2018-06-22 DIAGNOSIS — K580 Irritable bowel syndrome with diarrhea: Secondary | ICD-10-CM | POA: Diagnosis not present

## 2018-06-22 DIAGNOSIS — N320 Bladder-neck obstruction: Secondary | ICD-10-CM

## 2018-06-22 DIAGNOSIS — Z23 Encounter for immunization: Secondary | ICD-10-CM

## 2018-06-22 DIAGNOSIS — F339 Major depressive disorder, recurrent, unspecified: Secondary | ICD-10-CM | POA: Diagnosis not present

## 2018-06-22 DIAGNOSIS — F32A Depression, unspecified: Secondary | ICD-10-CM | POA: Insufficient documentation

## 2018-06-22 DIAGNOSIS — K589 Irritable bowel syndrome without diarrhea: Secondary | ICD-10-CM | POA: Insufficient documentation

## 2018-06-22 DIAGNOSIS — G8929 Other chronic pain: Secondary | ICD-10-CM

## 2018-06-22 DIAGNOSIS — F329 Major depressive disorder, single episode, unspecified: Secondary | ICD-10-CM | POA: Insufficient documentation

## 2018-06-22 MED ORDER — RABEPRAZOLE SODIUM 20 MG PO TBEC: 20.0000 mg | DELAYED_RELEASE_TABLET | Freq: Every day | ORAL | 3 refills | Status: DC

## 2018-06-22 MED ORDER — METOPROLOL SUCCINATE ER 25 MG PO TB24: ORAL_TABLET | ORAL | 3 refills | Status: AC

## 2018-06-22 MED ORDER — HYDROCODONE-ACETAMINOPHEN 5-325 MG PO TABS: 1.0000 | ORAL_TABLET | Freq: Two times a day (BID) | ORAL | 0 refills | Status: DC | PRN

## 2018-06-22 NOTE — Assessment & Plan Note (Signed)
Daily Cialis

## 2018-06-22 NOTE — Assessment & Plan Note (Signed)
Norco prn F/u w/Dr Vertell Limber

## 2018-06-22 NOTE — Patient Instructions (Addendum)
  Gluten free trial for 4-6 weeks. OK to use gluten-free bread and gluten-free pasta.    Gluten-Free Diet for Celiac Disease, Adult The gluten-free diet includes all foods that do not contain gluten. Gluten is a protein that is found in wheat, rye, barley, and some other grains. Following the gluten-free diet is the only treatment for people with celiac disease. It helps to prevent damage to the intestines and improves or eliminates the symptoms of celiac disease. Following the gluten-free diet requires some planning. It can be challenging at first, but it gets easier with time and practice. There are more gluten-free options available today than ever before. If you need help finding gluten-free foods or if you have questions, talk with your diet and nutrition specialist (registered dietitian) or your health care provider. What do I need to know about a gluten-free diet?  All fruits, vegetables, and meats are safe to eat and do not contain gluten.  When grocery shopping, start by shopping in the produce, meat, and dairy sections. These sections are more likely to contain gluten-free foods. Then move to the aisles that contain packaged foods if you need to.  Read all food labels. Gluten is often added to foods. Always check the ingredient list and look for warnings, such as "may contain gluten."  Talk with your dietitian or health care provider before taking a gluten-free multivitamin or mineral supplement.  Be aware of gluten-free foods having contact with foods that contain gluten (cross-contamination). This can happen at home and with any processed foods. ? Talk with your health care provider or dietitian about how to reduce the risk of cross-contamination in your home. ? If you have questions about how a food is processed, ask the manufacturer. What key words help to identify gluten? Foods that list any of these key words on the label usually contain gluten:  Wheat, flour, enriched  flour, bromated flour, white flour, durum flour, graham flour, phosphated flour, self-rising flour, semolina, farina, barley (malt), rye, and oats.  Starch, dextrin, modified food starch, or cereal.  Thickening, fillers, or emulsifiers.  Malt flavoring, malt extract, or malt syrup.  Hydrolyzed vegetable protein.  In the [place] packaged foods that are gluten-free are required to be labeled "GF." These foods should be easy to identify and are safe to eat. In the [place] food companies are also required to list common food allergens, including wheat, on their labels. Recommended foods Grains  Amaranth, bean flours, 100% buckwheat flour, corn, millet, nut flours or nut meals, GF oats, quinoa, rice, sorghum, teff, rice wafers, pure cornmeal tortillas, popcorn, and hot cereals made from cornmeal. Hominy, rice, wild rice. Some Asian rice noodles or bean noodles. Arrowroot starch, corn bran, corn flour, corn germ, cornmeal, corn starch, potato flour, potato starch flour, and rice bran. Plain, brown, and sweet rice flours. Rice polish, soy flour, and tapioca starch. Vegetables  All plain fresh, frozen, and canned vegetables. Fruits  All plain fresh, frozen, canned, and dried fruits, and 100% fruit juices. Meats and other protein foods  All fresh beef, pork, poultry, fish, seafood, and eggs. Fish canned in water, oil, brine, or vegetable broth. Plain nuts and seeds, peanut butter. Some lunch meat and some frankfurters. Dried beans, dried peas, and lentils. Dairy  Fresh plain, dry, evaporated, or condensed milk. Cream, butter, sour cream, whipping cream, and most yogurts. Unprocessed cheese, most processed cheeses, some cottage cheese, some cream cheeses. Beverages  Coffee, tea, most herbal teas. Carbonated beverages and some root beers.   Wine, sake, and distilled spirits, such as gin, vodka, and whiskey. Most hard ciders. Fats and oils  Butter, margarine, vegetable oil, hydrogenated butter, olive  oil, shortening, lard, cream, and some mayonnaise. Some commercial salad dressings. Olives. Sweets and desserts  Sugar, honey, some syrups, molasses, jelly, and jam. Plain hard candy, marshmallows, and gumdrops. Pure cocoa powder. Plain chocolate. Custard and some pudding mixes. Gelatin desserts, sorbets, frozen ice pops, and sherbet. Cake, cookies, and other desserts prepared with allowed flours. Some commercial ice creams. Cornstarch, tapioca, and rice puddings. Seasoning and other foods  Some canned or frozen soups. Monosodium glutamate (MSG). Cider, rice, and wine vinegar. Baking soda and baking powder. Cream of tartar. Baking and nutritional yeast. Certain soy sauces made without wheat (ask your dietitian about specific brands that are allowed). Nuts, coconut, and chocolate. Salt, pepper, herbs, spices, flavoring extracts, imitation or artificial flavorings, natural flavorings, and food colorings. Some medicines and supplements. Some lip glosses and other cosmetics. Rice syrups. The items listed may not be a complete list. Talk with your dietitian about what dietary choices are best for you. Foods to avoid Grains  Barley, bran, bulgur, couscous, cracked wheat, , farro, graham, malt, matzo, semolina, wheat germ, and all wheat and rye cereals including spelt and kamut. Cereals containing malt as a flavoring, such as rice cereal. Noodles, spaghetti, macaroni, most packaged rice mixes, and all mixes containing wheat, rye, barley, or triticale. Vegetables  Most creamed vegetables and most vegetables canned in sauces. Some commercially prepared vegetables and salads. Fruits  Thickened or prepared fruits and some pie fillings. Some fruit snacks and fruit roll-ups. Meats and other protein foods  Any meat or meat alternative containing wheat, rye, barley, or gluten stabilizers. These are often marinated or packaged meats and lunch meats. Bread-containing products, such as Swiss steak,  croquettes, meatballs, and meatloaf. Most tuna canned in vegetable broth and turkey with hydrolyzed vegetable protein (HVP) injected as part of the basting. Seitan. Imitation fish. Eggs in sauces made from ingredients to avoid. Dairy  Commercial chocolate milk drinks and malted milk. Some non-dairy creamers. Any cheese product containing ingredients to avoid. Beverages  Certain cereal beverages. Beer, ale, malted milk, and some root beers. Some hard ciders. Some instant flavored coffees. Some herbal teas made with barley or with barley malt added. Fats and oils  Some commercial salad dressings. Sour cream containing modified food starch. Sweets and desserts  Some toffees. Chocolate-coated nuts (may be rolled in wheat flour) and some commercial candies and candy bars. Most cakes, cookies, donuts, pastries, and other baked goods. Some commercial ice cream. Ice cream cones. Commercially prepared mixes for cakes, cookies, and other desserts. Bread pudding and other puddings thickened with flour. Products containing brown rice syrup made with barley malt enzyme. Desserts and sweets made with malt flavoring. Seasoning and other foods  Some curry powders, some dry seasoning mixes, some gravy extracts, some meat sauces, some ketchups, some prepared mustards, and horseradish. Certain soy sauces. Malt vinegar. Bouillon and bouillon cubes that contain HVP. Some chip dips, and some chewing gum. Yeast extract. Brewer's yeast. Caramel color. Some medicines and supplements. Some lip glosses and other cosmetics. The items listed may not be a complete list. Talk with your dietitian about what dietary choices are best for you. Summary  Gluten is a protein that is found in wheat, rye, barley, and some other grains. The gluten-free diet includes all foods that do not contain gluten.  If you need help finding gluten-free foods or if   you have questions, talk with your diet and nutrition specialist (registered  dietitian) or your health care provider.  Read all food labels. Gluten is often added to foods. Always check the ingredient list and look for warnings, such as "may contain gluten." This information is not intended to replace advice given to you by your health care provider. Make sure you discuss any questions you have with your health care provider. Document Released: 08/01/2005 Document Revised: 05/16/2016 Document Reviewed: 05/16/2016 Elsevier Interactive Patient Education  2018 [employer].  Lactose Intolerance, Adult Lactose is the natural sugar found in milk and milk products, such as cheese and yogurt. Lactose is digested by lactase, an enzyme in your small intestine. Some people do not produce enough lactase to digest lactose. This is called lactose intolerance. Lactose intolerance is different from milk allergy, which is a more serious reaction to the protein in milk. What are the causes? Causes of lactose intolerance may include:  Normal aging. The ability to produce lactase may decline with age, causing lactose intolerance over time.  Being born without the ability to make lactase.  Digestive diseases such as gastroenteritis or inflammatory bowel disease.  Surgery or injuries to your small intestine.  Infection in your intestines.  Certain antibiotic medicines and cancer treatments.  What are the signs or symptoms? Lactose intolerance can cause uncomfortable symptoms. These are likely to occur within 30 minutes to 2 hours after eating or drinking foods containing lactose. Symptoms of lactose intolerance may include:  Nausea.  Diarrhea.  Abdominal cramps or pain.  Bloating.  Gas.  How is this diagnosed? There are several tests your health care provider can do to diagnose lactose intolerance. These tests include a hydrogen breath test and stool acidity test. How is this treated? No treatment can improve your body's ability to produce lactase. However, your symptoms  can be controlled by limiting or avoiding milk products and other sources of lactose and adjusting your diet. Lactose-free milk is often tolerated. Lactose digestion may also be improved by adding lactase drops to regular milk or by taking lactase tablets when dairy products are consumed. Tolerance to lactose is individual. Some people may be able to eat or drink small amounts of products with lactose, while other may need to avoid lactose entirely. Talk to your health care provider about what is best for you. Follow these instructions at home:  Limit or avoidfoods, beverages, and medicines containing lactose as directed by your health care provider.  Read food and medicine labels carefully to avoid products containing lactose, milk solids, casein, or whey.  If you eliminate dairy products, replace the protein, calcium, vitamin D, and other nutrients they contain through other foods. A registered dietitian or your health care provider can help you adjust your diet.  Choose a milk substitute that is fortified with calcium and vitamin D. Be aware that soy milk contains high quality protein, while milks made from nuts or grains contain very little protein.  Use lactase drops or tablets if directed by your health care provider. Contact a health care provider if: You have no relief from your symptoms after eliminating milk products and other sources of lactose. This information is not intended to replace advice given to you by your health care provider. Make sure you discuss any questions you have with your health care provider. Document Released: 08/01/2005 Document Revised: 01/07/2016 Document Reviewed: 11/01/2013 Elsevier Interactive Patient Education  2018 [employer].

## 2018-06-22 NOTE — Progress Notes (Signed)
Subjective:  Patient ID: [name] [name], male    DOB: Mar 18, 1956  Age: 62 y.o. MRN: [id number]  CC: No chief complaint on file.   HPI [name] [name] presents for HTN, LBP, depression f/u  Outpatient Medications Prior to Visit  Medication Sig Dispense Refill  . amphetamine-dextroamphetamine (ADDERALL XR) 10 MG 24 hr capsule Take by mouth as needed.    Marland Kitchen aspirin 81 MG tablet Take 81 mg by mouth 2 (two) times daily.     . ciclopirox (PENLAC) 8 % solution Apply 1 application topically at bedtime as needed (toe fungus). Apply over nail and surrounding skin. Apply daily over previous coat. After seven (7) days, may remove with alcohol and continue cycle. 6 mL 1  . cyclobenzaprine (FLEXERIL) 10 MG tablet TAKE 1 TABLET THREE TIMES A DAY AS NEEDED FOR MUSCLE SPASMS 90 tablet 1  . DYMISTA 137-50 MCG/ACT SUSP USE 2 SPRAYS IN EACH NOSTRIL AT BEDTIME AS NEEDED 69 g 3  . hydrOXYzine (ATARAX/VISTARIL) 10 MG tablet Take 1 tablet (10 mg total) by mouth 3 (three) times daily as needed for itching. 30 tablet 0  . methylPREDNISolone (MEDROL) 4 MG TBPK tablet 6 tabs today, 5 tabs day 2, 4 tabs day 3, 3 tabs day 4, 2 tabs day 5, 1 tab day 6. Take w/food. 21 tablet 0  . QUEtiapine (SEROQUEL) 50 MG tablet Take 50-150 mg by mouth at bedtime as needed.     . tadalafil (CIALIS) 5 MG tablet Take 1 tablet (5 mg total) by mouth daily as needed for erectile dysfunction. (Patient taking differently: Take 5 mg by mouth daily. ) 90 tablet 3  . testosterone (ANDROGEL) 50 MG/5GM (1%) GEL Place 10 g onto the skin daily.    Marland Kitchen dexlansoprazole (DEXILANT) 60 MG capsule Take 1 capsule (60 mg total) by mouth daily. 90 capsule 3  . HYDROcodone-acetaminophen (NORCO/VICODIN) 5-325 MG tablet Take 1-2 tablets by mouth 2 (two) times daily as needed for moderate pain or severe pain. 90 tablet 0  . metoprolol succinate (TOPROL-XL) 25 MG 24 hr tablet TAKE 1 TABLET DAILY (ANNUAL APPOINTMENT DUE IN [place], MUST SEE PROVIDER FOR FUTURE REFILLS) 90  tablet 3   No facility-administered medications prior to visit.     ROS: Review of Systems  Constitutional: Negative for appetite change, fatigue and unexpected weight change.  HENT: Negative for congestion, nosebleeds, sneezing, sore throat and trouble swallowing.   Eyes: Negative for itching and visual disturbance.  Respiratory: Negative for cough.   Cardiovascular: Negative for chest pain, palpitations and leg swelling.  Gastrointestinal: Negative for abdominal distention, blood in stool, diarrhea and nausea.  Genitourinary: Negative for frequency and hematuria.  Musculoskeletal: Positive for back pain. Negative for gait problem, joint swelling and neck pain.  Skin: Negative for rash.  Neurological: Negative for dizziness, tremors, speech difficulty and weakness.  Psychiatric/Behavioral: Positive for dysphoric mood. Negative for agitation, sleep disturbance and suicidal ideas. The patient is nervous/anxious.     Objective:  BP 132/74 (BP Location: Left Arm, Patient Position: Sitting, Cuff Size: Large)   Pulse (!) 49   Temp 97.7 F (36.5 C) (Oral)   Ht 5\' 9"  (1.753 m)   Wt 216 lb (98 kg)   SpO2 98%   BMI 31.90 kg/m   BP Readings from Last 3 Encounters:  06/22/18 132/74  05/08/18 140/72  04/27/18 140/80    Wt Readings from Last 3 Encounters:  06/22/18 216 lb (98 kg)  05/08/18 210 lb 9.6 oz (95.5 kg)  04/27/18 215 lb (97.5 kg)    Physical Exam  Constitutional: He is oriented to person, place, and time. He appears well-developed. No distress.  NAD  HENT:  Mouth/Throat: Oropharynx is clear and moist.  Eyes: Pupils are equal, round, and reactive to light. Conjunctivae are normal.  Neck: Normal range of motion. No JVD present. No thyromegaly present.  Cardiovascular: Normal rate, regular rhythm, normal heart sounds and intact distal pulses. Exam reveals no gallop and no friction rub.  No murmur heard. Pulmonary/Chest: Effort normal and breath sounds normal. No  respiratory distress. He has no wheezes. He has no rales. He exhibits no tenderness.  Abdominal: Soft. Bowel sounds are normal. He exhibits no distension and no mass. There is no tenderness. There is no rebound and no guarding.  Musculoskeletal: Normal range of motion. He exhibits tenderness. He exhibits no edema.  Lymphadenopathy:    He has no cervical adenopathy.  Neurological: He is alert and oriented to person, place, and time. He has normal reflexes. No cranial nerve deficit. He exhibits normal muscle tone. He displays a negative Romberg sign. Coordination and gait normal.  Skin: Skin is warm and dry. No rash noted.  Psychiatric: He has a normal mood and affect. His behavior is normal. Judgment and thought content normal.  LS tender L shoulder >R painful Obese   Lab Results  Component Value Date   WBC 4.2 03/01/2018   HGB 14.5 03/01/2018   HCT 44.3 03/01/2018   PLT 247.0 03/01/2018   GLUCOSE 109 (H) 10/20/2017   CHOL 182 10/20/2017   TRIG 103.0 10/20/2017   HDL 42.50 10/20/2017   LDLDIRECT 77.5 12/26/2008   LDLCALC 119 (H) 10/20/2017   ALT 24 10/20/2017   AST 21 10/20/2017   NA 135 10/20/2017   K 4.5 10/20/2017   CL 102 10/20/2017   CREATININE 1.12 10/20/2017   BUN 29 (H) 10/20/2017   CO2 25 10/20/2017   TSH 2.43 10/20/2017   PSA 3.00 10/20/2017   HGBA1C 6.0 07/03/2014    No results found.  Assessment & Plan:   There are no diagnoses linked to this encounter.   Meds ordered this encounter  Medications  . metoprolol succinate (TOPROL-XL) 25 MG 24 hr tablet    Sig: TAKE 2 TABLETS DAILY    Dispense:  180 tablet    Refill:  3  . HYDROcodone-acetaminophen (NORCO/VICODIN) 5-325 MG tablet    Sig: Take 1-2 tablets by mouth 2 (two) times daily as needed for moderate pain or severe pain.    Dispense:  90 tablet    Refill:  0  . RABEprazole (ACIPHEX) 20 MG tablet    Sig: Take 1 tablet (20 mg total) by mouth daily.    Dispense:  90 tablet    Refill:  3      Follow-up: No follow-ups on file.  Walker Kehr, MD

## 2018-06-22 NOTE — Assessment & Plan Note (Signed)
Psych appt via [place]

## 2018-06-22 NOTE — Assessment & Plan Note (Signed)
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

## 2018-07-03 ENCOUNTER — Other Ambulatory Visit: Payer: Self-pay | Admitting: Internal Medicine

## 2018-08-26 ENCOUNTER — Other Ambulatory Visit: Payer: Self-pay | Admitting: Internal Medicine

## 2018-08-26 MED ORDER — AMPHETAMINE-DEXTROAMPHET ER 10 MG PO CP24: 10.0000 mg | ORAL_CAPSULE | ORAL | 0 refills | Status: DC

## 2018-09-27 ENCOUNTER — Ambulatory Visit: Payer: Self-pay | Admitting: *Deleted

## 2018-09-27 NOTE — Telephone Encounter (Signed)
Pt calling to report increased BP reading of 163/96 and HR-67. Pt reports for the past 2 weeks or more he has also been experiencing headaches and some eye pain and blurry vision at times. Pt reports that he has been taking Torprol in the am and pm and has not missed any doses recently. Pt scheduled for appt with Ashleigh,NP on 09/28/18. Pt advised to seek treatment in the ED for worsening symptoms. Understanding verbalized.  Reason for Disposition . Systolic BP  >= 885 OR Diastolic >= 027  Answer Assessment - Initial Assessment Questions 1. BLOOD PRESSURE: "What is the blood pressure?" "Did you take at least two measurements 5 minutes apart?"     163/96 and HR-67 2. ONSET: "When did you take your blood pressure?"     While on the phone with P[employer] agent 3. HOW: "How did you obtain the blood pressure?" (e.g., visiting nurse, automatic home BP monitor)     Arm monitor 4. HISTORY: "Do you have a history of high blood pressure?"     yes 5. MEDICATIONS: "Are you taking any medications for blood pressure?" "Have you missed any doses recently?"     Toprol in am and night, and has not missed any doses 6. OTHER SYMPTOMS: "Do you have any symptoms?" (e.g., headache, chest pain, blurred vision, difficulty breathing, weakness)     Headache and eye pain for 2 plus weeks, both eyes and gets blurry at times  Protocols used: HIGH BLOOD PRESSURE-A-AH

## 2018-09-28 ENCOUNTER — Other Ambulatory Visit (INDEPENDENT_AMBULATORY_CARE_PROVIDER_SITE_OTHER)

## 2018-09-28 ENCOUNTER — Encounter: Payer: Self-pay | Admitting: Nurse Practitioner

## 2018-09-28 ENCOUNTER — Ambulatory Visit (INDEPENDENT_AMBULATORY_CARE_PROVIDER_SITE_OTHER): Admitting: Nurse Practitioner

## 2018-09-28 VITALS — BP 120/80 | HR 58 | Ht 69.0 in | Wt 216.0 lb

## 2018-09-28 DIAGNOSIS — R079 Chest pain, unspecified: Secondary | ICD-10-CM | POA: Diagnosis not present

## 2018-09-28 DIAGNOSIS — R42 Dizziness and giddiness: Secondary | ICD-10-CM | POA: Diagnosis not present

## 2018-09-28 DIAGNOSIS — I10 Essential (primary) hypertension: Secondary | ICD-10-CM

## 2018-09-28 DIAGNOSIS — R109 Unspecified abdominal pain: Secondary | ICD-10-CM | POA: Diagnosis not present

## 2018-09-28 DIAGNOSIS — R0602 Shortness of breath: Secondary | ICD-10-CM

## 2018-09-28 LAB — COMPREHENSIVE METABOLIC PANEL
ALK PHOS: 65 U/L (ref 39–117)
ALT: 25 U/L (ref 0–53)
AST: 17 U/L (ref 0–37)
Albumin: 4.3 g/dL (ref 3.5–5.2)
BUN: 22 mg/dL (ref 6–23)
CO2: 29 mEq/L (ref 19–32)
Calcium: 9.2 mg/dL (ref 8.4–10.5)
Chloride: 102 mEq/L (ref 96–112)
Creatinine, Ser: 1.16 mg/dL (ref 0.40–1.50)
GFR: 63.21 mL/min (ref >60.00)
Glucose, Bld: 89 mg/dL (ref 70–99)
Potassium: 4.7 mEq/L (ref 3.5–5.1)
Sodium: 140 mEq/L (ref 135–145)
TOTAL PROTEIN: 7.3 g/dL (ref 6.0–8.3)
Total Bilirubin: 0.5 mg/dL (ref 0.2–1.2)

## 2018-09-28 LAB — CBC
HCT: 47.1 % (ref 39.0–52.0)
Hemoglobin: 15.3 g/dL (ref 13.0–17.0)
MCHC: 32.6 g/dL (ref 30.0–36.0)
MCV: 84.8 fl (ref 78.0–100.0)
Platelets: 241 10*3/uL (ref 150.0–400.0)
RBC: 5.55 Mil/uL (ref 4.22–5.81)
RDW: 16.7 % — ABNORMAL HIGH (ref 11.5–15.5)
WBC: 5.7 10*3/uL (ref 4.0–10.5)

## 2018-09-28 LAB — BRAIN NATRIURETIC PEPTIDE: Pro B Natriuretic peptide (BNP): 47 pg/mL (ref 0.0–100.0)

## 2018-09-28 MED ORDER — OMEPRAZOLE 40 MG PO CPDR: 40.0000 mg | DELAYED_RELEASE_CAPSULE | Freq: Every day | ORAL | 3 refills | Status: DC

## 2018-09-28 NOTE — Progress Notes (Signed)
[name] [name] is a 63 y.o. male with the following history as recorded in [employer]:  Patient Active Problem List   Diagnosis Date Noted  . IBS (irritable bowel syndrome) 06/22/2018  . Depression 06/22/2018  . Bradycardia, sinus 09/16/2016  . 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 Surgery, elective 09/15/2016  . 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 Sinusitis, chronic 07/06/2015  . Plantar fasciitis of left foot 07/06/2015  . Acute bronchitis 04/07/2015  . Obesity 03/04/2015  . Rhinitis 01/20/2015  . Bladder neck obstruction 11/28/2014  . Midsternal chest pain 06/18/2014  . Essential hypertension 06/18/2014  . Dry mouth 03/06/2014  . Well adult exam 03/06/2014  . Articular cartilage disorder, shoulder region 11/19/2013  . Stressful life event affecting family 03/31/2013  . 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 Adjustment disorder with mixed anxiety and depressed mood 10/04/2010  . DYSPHAGIA 07/20/2010  . PERSONAL HX COLONIC POLYPS 07/20/2010  . 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 CHANGE IN BOWELS 09/10/2008  . Obstructive sleep apnea 02/28/2008  . NEOPLASM UNCERTAIN BHV CNCTV&OTH SOFT TISSUE 02/27/2008  . BPH (benign prostatic hyperplasia) 02/27/2008  . Dyslipidemia 11/27/2007  . Bipolar I disorder ([place]) 07/11/2007  . Osteoarthritis 07/11/2007  . Low back pain 07/11/2007  . Esophageal reflux 07/02/2007    Current Outpatient Medications  Medication Sig Dispense Refill  . amphetamine-dextroamphetamine (ADDERALL XR) 10 MG 24 hr capsule Take 1 capsule (10 mg total) by mouth every morning. 30 capsule 0  . aspirin 81 MG tablet Take 81 mg by mouth 2 (two) times daily.     Marland Kitchen buPROPion (WELLBUTRIN SR) 150 MG 12 hr tablet Take 1 tablet by mouth daily.    . ciclopirox (PENLAC) 8 % solution Apply 1 application topically at bedtime as needed (toe fungus). Apply over nail and surrounding skin. Apply daily over previous coat. After seven (7) days, may remove with alcohol and  continue cycle. 6 mL 1  . cyclobenzaprine (FLEXERIL) 10 MG tablet TAKE 1 TABLET THREE TIMES A DAY AS NEEDED FOR MUSCLE SPASMS 90 tablet 3  . DYMISTA 137-50 MCG/ACT SUSP USE 2 SPRAYS IN EACH NOSTRIL AT BEDTIME AS NEEDED 69 g 3  . HYDROcodone-acetaminophen (NORCO/VICODIN) 5-325 MG tablet Take 1-2 tablets by mouth 2 (two) times daily as needed for moderate pain or severe pain. 90 tablet 0  . hydrOXYzine (ATARAX/VISTARIL) 10 MG tablet Take 1 tablet (10 mg total) by mouth 3 (three) times daily as needed for itching. 30 tablet 0  . metoprolol succinate (TOPROL-XL) 25 MG 24 hr tablet TAKE 2 TABLETS DAILY 180 tablet 3  . QUEtiapine (SEROQUEL) 50 MG tablet Take 50-150 mg by mouth at bedtime as needed.     . tadalafil (CIALIS) 5 MG tablet Take 1 tablet (5 mg total) by mouth daily as needed for erectile dysfunction. (Patient taking differently: Take 5 mg by mouth daily. ) 90 tablet 3  . testosterone (ANDROGEL) 50 MG/5GM (1%) GEL Place 10 g onto the skin daily.    Marland Kitchen omeprazole (PRILOSEC) 40 MG capsule Take 1 capsule (40 mg total) by mouth daily. 30 capsule 3   No current facility-administered medications for this visit.     Allergies: Amoxicillin-pot clavulanate; Ciprofloxacin-dexamethasone; and Propofol  Past Medical History:  Diagnosis Date  . ADD (attention deficit disorder)    takes Adderall and Vyvanse daily  . Anxiety   . Arthritis    shoulders  . Bipolar depression ([place])    takes Wellbutrin daily  . Carpal tunnel syndrome  right  . Chronic back pain    spinal stenosis  . Complication of anesthesia    proprofol caused rash  . Dyspnea    with exertion occasionally  . Enlarged prostate   . GERD (gastroesophageal reflux disease)    takes Pantoprazole daily  . History of bronchitis   . History of colon polyps    benign  . History of gastric ulcer 1985  . Hypertension    takes Metoprolol nightly  . Muscle spasm    takes Flexeril as needed  . Restless leg    occasionally  . Sciatic  nerve pain   . Sleep apnea    uses CPAP nightly  . Weakness    numbness in right hand and legs     Past Surgical History:  Procedure Laterality Date  . APPENDECTOMY  1978  . CARPAL TUNNEL RELEASE Right 09/15/2016   Procedure: CARPAL TUNNEL RELEASE-RIGHT;  Surgeon: Erline Levine, MD;  Location: [facility];  Service: Neurosurgery;  Laterality: Right;  CARPAL TUNNEL RELEASE-RIGHT  . COLONOSCOPY     multiple  . ESOPHAGOGASTRODUODENOSCOPY     multiple with dilitation  . GANGLION CYST EXCISION  1990   right elbow, left ankle - separate surgeries  . HERNIA REPAIR     inguinal  . LASIK    . left carpal tunnel   end of 2017  . left shoulder surgery  2013  . MAXIMUM ACCESS (MAS)POSTERIOR LUMBAR INTERBODY FUSION (PLIF) 2 LEVEL N/A 09/15/2016   Procedure: LUMBAR THREE-FOUR, LUMBAR FOUR-FIVE  MAXIMUM ACCESS POSTERIOR LUMBAR INTERBODY FUSION;  Surgeon: Erline Levine, MD;  Location: [facility];  Service: Neurosurgery;  Laterality: N/A;  L3-4 L4-5 MAXIMUM ACCESS POSTERIOR LUMBAR INTERBODY FUSION  . NASAL SEPTOPLASTY W/ TURBINOPLASTY  01/04/2007   inferior turb. reduction  . PROSTATE SURGERY  10/01/2008   transurethral incision prostate  . SHOULDER ARTHROSCOPY WITH SUBACROMIAL DECOMPRESSION Right 11/19/2013   Procedure: RIGHT SHOULDER ARTHROSCOPY WITH SUBACROMIAL DECOMPRESSION/DISTAL CLAVICAL RESECTION/REPAIR SUPRASPINATUS INFRASPINATUS;  Surgeon: Cammie Sickle., MD;  Location: [facility];  Service: Orthopedics;  Laterality: Right;  ANESTHESIA: GENERAL/REGIONAL BLOCK  Procedure SAF DCR Bicepts tenotomy Grade 3 slap Debridment  . SHOULDER SURGERY  08/21/2003 - left   1995 - right x 3  . TRANSURETHRAL RESECTION OF PROSTATE    . ULNAR NERVE REPAIR  03/10/2006   decompression left ulnar nerve  . VASECTOMY      Family History  Problem Relation Age of Onset  . Lung cancer Father   . Coronary artery disease Father   . Diabetes Other   . Asthma Other   . Coronary artery disease Other   . Coronary  artery disease Paternal Grandfather     Social History   Tobacco Use  . Smoking status: Never Smoker  . Smokeless tobacco: Never Used  Substance Use Topics  . Alcohol use: Yes    Comment: occasionally     Subjective:  Mr Whitfill is here today for evaluation of acute complaint of elevated BP readings and episodes of dizziness, abd pain, cp at home over past 2 weeks or so. He tells me over the past 2 weeks hes been waking every morning with aching abdominal pain, which continues intermittently throughout the day, along with several episodes of blurred vision, nausea, chest pain, sob, dizziness, which also come and go throughout the day. He has been checking his BP more frequently since these symptoms began and noticed higher readings: 140s-160s/80s-90s, taking metoprolol for htn daily as prescribed Of  note, he was switched to new PPI a few weeks ago- rabeprazole, due to insurance, seemed to start having the abdominal pain after that.   Review of Systems  Constitutional: Negative for chills and fever.  HENT: Negative for hearing loss.   Eyes: Positive for blurred vision.  Respiratory: Positive for shortness of breath. Negative for cough.   Cardiovascular: Positive for chest pain. Negative for leg swelling.  Gastrointestinal: Positive for abdominal pain and nausea. Negative for diarrhea and vomiting.  Genitourinary: Negative for dysuria and hematuria.  Musculoskeletal: Negative for falls.  Neurological: Positive for dizziness and headaches. Negative for speech change, loss of consciousness and weakness.  Psychiatric/Behavioral: Negative for memory loss.   Objective:  Vitals:   09/28/18 0800  BP: 120/80  Pulse: (!) 58  SpO2: 97%  Weight: 216 lb (98 kg)  Height: 5\' 9"  (1.753 m)    General: Well developed, well nourished, in no acute distress  Skin : Warm and dry.  Head: Normocephalic and atraumatic  Eyes: Sclera and conjunctiva clear; pupils round and reactive to light; extraocular  movements intact  Oropharynx: Pink, supple. No suspicious lesions  Neck: Supple without thyromegaly  Lungs: Respirations unlabored; clear to auscultation bilaterally without wheeze, rales, rhonchi  CVS exam: Bradycardic rate and regular rhythm, S1 and S2 normal.  Abdomen: Soft; rotund; normoactive bowel sounds; no masses or hepatosplenomegaly; no guarding, rigidity Musculoskeletal: No deformities; no active joint inflammation  Extremities: No edema, cyanosis, clubbing  Vessels: Symmetric bilaterally  Neurologic: Alert and oriented; speech intact; face symmetrical; moves all extremities well; CNII-XII intact without focal deficit  Psychiatric: Normal mood and affect.  Assessment:  1. Chest pain, unspecified type   2. Abdominal pain, unspecified abdominal location   3. Essential hypertension   4. Dizziness   5. Shortness of breath     Plan:   VS, PE are stable EKG today: sinus brady, some artifact, not suspicious for cardiac abnormality Labs ordered for further evaluation Will try switching to a different PPI to see if symptoms improve Home management, red flags and strict return precautions including when to seek immediate/emergency care discussed and printed on AVS RTC in 1 week for F/U with PCP   Return in about 1 week (around 10/05/2018) for F/U.  Orders Placed This Encounter  Procedures  . CBC    Standing Status:   Future    Number of Occurrences:   1    Standing Expiration Date:   09/29/2019  . Comprehensive metabolic panel    Standing Status:   Future    Number of Occurrences:   1    Standing Expiration Date:   09/29/2019  . B Nat Peptide    Standing Status:   Future    Number of Occurrences:   1    Standing Expiration Date:   09/28/2019  . EKG 12-Lead    Order Specific Question:   Where should this test be performed?    Answer:   Other    Requested Prescriptions   Signed Prescriptions Disp Refills  . omeprazole (PRILOSEC) 40 MG capsule 30 capsule 3    Sig: Take 1  capsule (40 mg total) by mouth daily.

## 2018-09-28 NOTE — Patient Instructions (Addendum)
Head downstairs for labs today  Stop rabeprazole, start omeprazole  Please return for follow up with Dr Alain Marion in 1 week   Nonspecific Chest Pain Chest pain can be caused by many different conditions. Some causes of chest pain can be life-threatening. These will require treatment right away. Serious causes of chest pain include:  Heart attack.  A tear in the body's main blood vessel.  Redness and swelling (inflammation) around your heart.  Blood clot in your lungs. Other causes of chest pain may not be so serious. These include:  Heartburn.  Anxiety or stress.  Damage to bones or muscles in your chest.  Lung infections. Chest pain can feel like:  Pain or discomfort in your chest.  Crushing, pressure, aching, or squeezing pain.  Burning or tingling.  Dull or sharp pain that is worse when you move, cough, or take a deep breath.  Pain or discomfort that is also felt in your back, neck, jaw, shoulder, or arm, or pain that spreads to any of these areas. It is hard to know whether your pain is caused by something that is serious or something that is not so serious. So it is important to see your doctor right away if you have chest pain. Follow these instructions at home: Medicines  Take over-the-counter and prescription medicines only as told by your doctor.  If you were prescribed an antibiotic medicine, take it as told by your doctor. Do not stop taking the antibiotic even if you start to feel better. Lifestyle   Rest as told by your doctor.  Do not use any products that contain nicotine or tobacco, such as cigarettes, e-cigarettes, and chewing tobacco. If you need help quitting, ask your doctor.  Do not drink alcohol.  Make lifestyle changes as told by your doctor. These may include: ? Getting regular exercise. Ask your doctor what activities are safe for you. ? Eating a heart-healthy diet. A diet and nutrition specialist (dietitian) can help you to learn  healthy eating options. ? Staying at a healthy weight. ? Treating diabetes or high blood pressure, if needed. ? Lowering your stress. Activities such as yoga and relaxation techniques can help. General instructions  Pay attention to any changes in your symptoms. Tell your doctor about them or any new symptoms.  Avoid any activities that cause chest pain.  Keep all follow-up visits as told by your doctor. This is important. You may need more testing if your chest pain does not go away. Contact a doctor if:  Your chest pain does not go away.  You feel depressed.  You have a fever. Get help right away if:  Your chest pain is worse.  You have a cough that gets worse, or you cough up blood.  You have very bad (severe) pain in your belly (abdomen).  You pass out (faint).  You have either of these for no clear reason: ? Sudden chest discomfort. ? Sudden discomfort in your arms, back, neck, or jaw.  You have shortness of breath at any time.  You suddenly start to sweat, or your skin gets clammy.  You feel sick to your stomach (nauseous).  You throw up (vomit).  You suddenly feel lightheaded or dizzy.  You feel very weak or tired.  Your heart starts to beat fast, or it feels like it is skipping beats. These symptoms may be an emergency. Do not wait to see if the symptoms will go away. Get medical help right away. Call your local emergency  services (911 in the [place] Do not drive yourself to the hospital. Summary  Chest pain can be caused by many different conditions. The cause may be serious and need treatment right away. If you have chest pain, see your doctor right away.  Follow your doctor's instructions for taking medicines and making lifestyle changes.  Keep all follow-up visits as told by your doctor. This includes visits for any further testing if your chest pain does not go away.  Be sure to know the signs that show that your condition has become worse. Get help  right away if you have these symptoms. This information is not intended to replace advice given to you by your health care provider. Make sure you discuss any questions you have with your health care provider. Document Released: 01/18/2008 Document Revised: 02/01/2018 Document Reviewed: 02/01/2018 Elsevier Interactive Patient Education  2019 [employer].

## 2018-10-04 ENCOUNTER — Ambulatory Visit (INDEPENDENT_AMBULATORY_CARE_PROVIDER_SITE_OTHER)
Admission: RE | Admit: 2018-10-04 | Discharge: 2018-10-04 | Disposition: A | Discharge status: Home / Self Care | Source: Ambulatory Visit | Attending: Internal Medicine | Admitting: Internal Medicine

## 2018-10-04 ENCOUNTER — Telehealth: Payer: Self-pay | Admitting: Internal Medicine

## 2018-10-04 ENCOUNTER — Encounter: Payer: Self-pay | Admitting: Internal Medicine

## 2018-10-04 ENCOUNTER — Ambulatory Visit (INDEPENDENT_AMBULATORY_CARE_PROVIDER_SITE_OTHER): Admitting: Internal Medicine

## 2018-10-04 DIAGNOSIS — R51 Headache: Secondary | ICD-10-CM

## 2018-10-04 DIAGNOSIS — I10 Essential (primary) hypertension: Secondary | ICD-10-CM

## 2018-10-04 DIAGNOSIS — R0789 Other chest pain: Secondary | ICD-10-CM | POA: Diagnosis not present

## 2018-10-04 DIAGNOSIS — E785 Hyperlipidemia, unspecified: Secondary | ICD-10-CM

## 2018-10-04 DIAGNOSIS — G4459 Other complicated headache syndrome: Secondary | ICD-10-CM | POA: Diagnosis not present

## 2018-10-04 DIAGNOSIS — R519 Headache, unspecified: Secondary | ICD-10-CM | POA: Insufficient documentation

## 2018-10-04 MED ORDER — OMEPRAZOLE 40 MG PO CPDR: 40.0000 mg | DELAYED_RELEASE_CAPSULE | Freq: Every day | ORAL | 3 refills | Status: DC

## 2018-10-04 MED ORDER — TRIAMTERENE-HCTZ 37.5-25 MG PO TABS: 1.0000 | ORAL_TABLET | Freq: Every day | ORAL | 11 refills | Status: AC

## 2018-10-04 MED ORDER — AMPHETAMINE-DEXTROAMPHETAMINE 10 MG PO TABS: 10.0000 mg | ORAL_TABLET | Freq: Every day | ORAL | 0 refills | Status: DC

## 2018-10-04 NOTE — Telephone Encounter (Signed)
Instructions clarified.

## 2018-10-04 NOTE — Assessment & Plan Note (Signed)
new severe x 2 weeks ?etiology CT head

## 2018-10-04 NOTE — Progress Notes (Signed)
Subjective:  Patient ID: [name] [name], male    DOB: 06-02-1956  Age: 63 y.o. MRN: [id number]  CC: No chief complaint on file.   HPI [name] [name] presents for CP last week. C/o HA, n/v, epig pain w/blurred vision x 2 weeks F/u ADD, pain  Outpatient Medications Prior to Visit  Medication Sig Dispense Refill  . amphetamine-dextroamphetamine (ADDERALL XR) 10 MG 24 hr capsule Take 1 capsule (10 mg total) by mouth every morning. 30 capsule 0  . aspirin 81 MG tablet Take 81 mg by mouth 2 (two) times daily.     . ciclopirox (PENLAC) 8 % solution Apply 1 application topically at bedtime as needed (toe fungus). Apply over nail and surrounding skin. Apply daily over previous coat. After seven (7) days, may remove with alcohol and continue cycle. 6 mL 1  . cyclobenzaprine (FLEXERIL) 10 MG tablet TAKE 1 TABLET THREE TIMES A DAY AS NEEDED FOR MUSCLE SPASMS 90 tablet 3  . DYMISTA 137-50 MCG/ACT SUSP USE 2 SPRAYS IN EACH NOSTRIL AT BEDTIME AS NEEDED 69 g 3  . HYDROcodone-acetaminophen (NORCO/VICODIN) 5-325 MG tablet Take 1-2 tablets by mouth 2 (two) times daily as needed for moderate pain or severe pain. 90 tablet 0  . hydrOXYzine (ATARAX/VISTARIL) 10 MG tablet Take 1 tablet (10 mg total) by mouth 3 (three) times daily as needed for itching. 30 tablet 0  . metoprolol succinate (TOPROL-XL) 25 MG 24 hr tablet TAKE 2 TABLETS DAILY 180 tablet 3  . omeprazole (PRILOSEC) 40 MG capsule Take 1 capsule (40 mg total) by mouth daily. 30 capsule 3  . QUEtiapine (SEROQUEL) 50 MG tablet Take 50-150 mg by mouth at bedtime as needed.     . tadalafil (CIALIS) 5 MG tablet Take 1 tablet (5 mg total) by mouth daily as needed for erectile dysfunction. (Patient taking differently: Take 5 mg by mouth daily. ) 90 tablet 3  . testosterone (ANDROGEL) 50 MG/5GM (1%) GEL Place 10 g onto the skin daily.    Marland Kitchen buPROPion (WELLBUTRIN SR) 150 MG 12 hr tablet Take 1 tablet by mouth daily.     No facility-administered medications prior to  visit.     ROS: Review of Systems  Constitutional: Positive for chills. Negative for appetite change, fatigue and unexpected weight change.  HENT: Negative for congestion, nosebleeds, sneezing, sore throat and trouble swallowing.   Eyes: Positive for visual disturbance. Negative for itching.  Respiratory: Negative for cough.   Cardiovascular: Positive for chest pain. Negative for palpitations and leg swelling.  Gastrointestinal: Positive for abdominal pain. Negative for abdominal distention, blood in stool, diarrhea and nausea.  Genitourinary: Negative for frequency and hematuria.  Musculoskeletal: Negative for back pain, gait problem, joint swelling and neck pain.  Skin: Negative for rash.  Neurological: Positive for dizziness and headaches. Negative for tremors, speech difficulty and weakness.  Psychiatric/Behavioral: Negative for agitation, dysphoric mood, sleep disturbance and suicidal ideas. The patient is nervous/anxious.     Objective:  BP 138/66 (BP Location: Left Arm, Patient Position: Sitting, Cuff Size: Large)   Pulse (!) 55   Temp 97.9 F (36.6 C) (Oral)   Ht 5\' 9"  (1.753 m)   Wt 214 lb (97.1 kg)   SpO2 98%   BMI 31.60 kg/m   BP Readings from Last 3 Encounters:  10/04/18 138/66  09/28/18 120/80  06/22/18 132/74    Wt Readings from Last 3 Encounters:  10/04/18 214 lb (97.1 kg)  09/28/18 216 lb (98 kg)  06/22/18 216  lb (98 kg)    Physical Exam Constitutional:      General: He is not in acute distress.    Appearance: He is well-developed.     Comments: NAD  Eyes:     Conjunctiva/sclera: Conjunctivae normal.     Pupils: Pupils are equal, round, and reactive to light.  Neck:     Musculoskeletal: Normal range of motion.     Thyroid: No thyromegaly.     Vascular: No JVD.  Cardiovascular:     Rate and Rhythm: Normal rate and regular rhythm.     Heart sounds: Normal heart sounds. No murmur. No friction rub. No gallop.   Pulmonary:     Effort: Pulmonary  effort is normal. No respiratory distress.     Breath sounds: Normal breath sounds. No wheezing or rales.  Chest:     Chest wall: No tenderness.  Abdominal:     General: Bowel sounds are normal. There is no distension.     Palpations: Abdomen is soft. There is no mass.     Tenderness: There is no abdominal tenderness. There is no guarding or rebound.  Musculoskeletal: Normal range of motion.        General: Tenderness present.  Lymphadenopathy:     Cervical: No cervical adenopathy.  Skin:    General: Skin is warm and dry.     Findings: No rash.  Neurological:     Mental Status: He is alert and oriented to person, place, and time.     Cranial Nerves: No cranial nerve deficit.     Motor: No abnormal muscle tone.     Coordination: Coordination normal.     Gait: Gait normal.     Deep Tendon Reflexes: Reflexes are normal and symmetric.  Psychiatric:        Behavior: Behavior normal.        Thought Content: Thought content normal.        Judgment: Judgment normal.    Neuro - nonfocal Neck supple   Lab Results  Component Value Date   WBC 5.7 09/28/2018   HGB 15.3 09/28/2018   HCT 47.1 09/28/2018   PLT 241.0 09/28/2018   GLUCOSE 89 09/28/2018   CHOL 182 10/20/2017   TRIG 103.0 10/20/2017   HDL 42.50 10/20/2017   LDLDIRECT 77.5 12/26/2008   LDLCALC 119 (H) 10/20/2017   ALT 25 09/28/2018   AST 17 09/28/2018   NA 140 09/28/2018   K 4.7 09/28/2018   CL 102 09/28/2018   CREATININE 1.16 09/28/2018   BUN 22 09/28/2018   CO2 29 09/28/2018   TSH 2.43 10/20/2017   PSA 3.00 10/20/2017   HGBA1C 6.0 07/03/2014    No results found.  Assessment & Plan:   There are no diagnoses linked to this encounter.   No orders of the defined types were placed in this encounter.    Follow-up: No follow-ups on file.  Walker Kehr, MD

## 2018-10-04 NOTE — Telephone Encounter (Signed)
Copied from [place] [id number]. Topic: Quick Communication - Rx Refill/Question >> Oct 04, 2018 12:07 PM Virl Axe D wrote: Medication: amphetamine-dextroamphetamine (ADDERALL) 10 MG tablet / Pharmacy stated they need to clarify instructions for rx and they need to speak directly with Dr. Alain Marion to do that. Please advise. Cb#[phone] EAV#[id number]  Has the patient contacted their pharmacy? Yes.   (Agent: If no, request that the patient contact the pharmacy for the refill.) (Agent: If yes, when and what did the pharmacy advise?)  Preferred Pharmacy (with phone number or street name): [facility], [address] [phone] (Phone) [phone] (Fax)  Agent: Please be advised that RX refills may take up to 3 business days. We ask that you follow-up with your pharmacy.

## 2018-10-04 NOTE — Assessment & Plan Note (Signed)
A  cardiac CT scan for calcium scoring test was offered 2/20

## 2018-10-04 NOTE — Assessment & Plan Note (Addendum)
Labile BP at home Toprol Added Maxzide 1/2 tab a day May need labs, 24 h urine

## 2018-10-04 NOTE — Assessment & Plan Note (Signed)
A  cardiac CT scan for calcium scoring test was offered

## 2018-10-04 NOTE — Patient Instructions (Signed)

## 2018-10-11 ENCOUNTER — Ambulatory Visit (INDEPENDENT_AMBULATORY_CARE_PROVIDER_SITE_OTHER): Admitting: Internal Medicine

## 2018-10-11 ENCOUNTER — Encounter: Payer: Self-pay | Admitting: Internal Medicine

## 2018-10-11 VITALS — BP 130/72 | HR 55 | Temp 97.8°F | Ht 69.0 in | Wt 211.0 lb

## 2018-10-11 DIAGNOSIS — I10 Essential (primary) hypertension: Secondary | ICD-10-CM

## 2018-10-11 DIAGNOSIS — R0789 Other chest pain: Secondary | ICD-10-CM | POA: Diagnosis not present

## 2018-10-11 DIAGNOSIS — R1084 Generalized abdominal pain: Secondary | ICD-10-CM

## 2018-10-11 DIAGNOSIS — E785 Hyperlipidemia, unspecified: Secondary | ICD-10-CM | POA: Diagnosis not present

## 2018-10-11 DIAGNOSIS — R071 Chest pain on breathing: Secondary | ICD-10-CM

## 2018-10-11 DIAGNOSIS — F339 Major depressive disorder, recurrent, unspecified: Secondary | ICD-10-CM

## 2018-10-11 NOTE — Progress Notes (Signed)
Subjective:  Patient ID: [name] [name], male    DOB: September 01, 1955  Age: 63 y.o. MRN: [id number]  CC: No chief complaint on file.   HPI Suede Greenawalt Bruski presents for CP, HA, labile BP f/u  CP is not better  Outpatient Medications Prior to Visit  Medication Sig Dispense Refill  . amphetamine-dextroamphetamine (ADDERALL) 10 MG tablet Take 1 tablet (10 mg total) by mouth daily with breakfast. 90 tablet 0  . aspirin 81 MG tablet Take 81 mg by mouth 2 (two) times daily.     . ciclopirox (PENLAC) 8 % solution Apply 1 application topically at bedtime as needed (toe fungus). Apply over nail and surrounding skin. Apply daily over previous coat. After seven (7) days, may remove with alcohol and continue cycle. 6 mL 1  . cyclobenzaprine (FLEXERIL) 10 MG tablet TAKE 1 TABLET THREE TIMES A DAY AS NEEDED FOR MUSCLE SPASMS 90 tablet 3  . DYMISTA 137-50 MCG/ACT SUSP USE 2 SPRAYS IN EACH NOSTRIL AT BEDTIME AS NEEDED 69 g 3  . HYDROcodone-acetaminophen (NORCO/VICODIN) 5-325 MG tablet Take 1-2 tablets by mouth 2 (two) times daily as needed for moderate pain or severe pain. 90 tablet 0  . metoprolol succinate (TOPROL-XL) 25 MG 24 hr tablet TAKE 2 TABLETS DAILY 180 tablet 3  . omeprazole (PRILOSEC) 40 MG capsule Take 1 capsule (40 mg total) by mouth daily. 90 capsule 3  . QUEtiapine (SEROQUEL) 50 MG tablet Take 50-150 mg by mouth at bedtime as needed.     . tadalafil (CIALIS) 5 MG tablet Take 1 tablet (5 mg total) by mouth daily as needed for erectile dysfunction. (Patient taking differently: Take 5 mg by mouth daily. ) 90 tablet 3  . testosterone (ANDROGEL) 50 MG/5GM (1%) GEL Place 10 g onto the skin daily.    Marland Kitchen triamterene-hydrochlorothiazide (MAXZIDE-25) 37.5-25 MG tablet Take 1 tablet by mouth daily. 30 tablet 11  . hydrOXYzine (ATARAX/VISTARIL) 10 MG tablet Take 1 tablet (10 mg total) by mouth 3 (three) times daily as needed for itching. 30 tablet 0   No facility-administered medications prior to visit.      ROS: Review of Systems  Constitutional: Positive for fatigue. Negative for appetite change and unexpected weight change.  HENT: Negative for congestion, nosebleeds, sneezing, sore throat and trouble swallowing.   Eyes: Negative for itching and visual disturbance.  Respiratory: Negative for cough and shortness of breath.   Cardiovascular: Positive for chest pain. Negative for palpitations and leg swelling.  Gastrointestinal: Positive for abdominal distention and abdominal pain. Negative for blood in stool, diarrhea and nausea.  Genitourinary: Negative for frequency and hematuria.  Musculoskeletal: Positive for arthralgias. Negative for back pain, gait problem, joint swelling and neck pain.  Skin: Negative for rash.  Neurological: Negative for dizziness, tremors, speech difficulty and weakness.  Psychiatric/Behavioral: Negative for agitation, dysphoric mood, sleep disturbance and suicidal ideas. The patient is not nervous/anxious.     Objective:  BP 130/72 (BP Location: Left Arm, Patient Position: Sitting, Cuff Size: Large)   Pulse (!) 55   Temp 97.8 F (36.6 C) (Oral)   Ht 5\' 9"  (1.753 m)   Wt 211 lb (95.7 kg)   SpO2 97%   BMI 31.16 kg/m   BP Readings from Last 3 Encounters:  10/11/18 130/72  10/04/18 138/66  09/28/18 120/80    Wt Readings from Last 3 Encounters:  10/11/18 211 lb (95.7 kg)  10/04/18 214 lb (97.1 kg)  09/28/18 216 lb (98 kg)    Physical  Exam Constitutional:      General: He is not in acute distress.    Appearance: He is well-developed.     Comments: NAD  Eyes:     Conjunctiva/sclera: Conjunctivae normal.     Pupils: Pupils are equal, round, and reactive to light.  Neck:     Musculoskeletal: Normal range of motion.     Thyroid: No thyromegaly.     Vascular: No JVD.  Cardiovascular:     Rate and Rhythm: Normal rate and regular rhythm.     Heart sounds: Normal heart sounds. No murmur. No friction rub. No gallop.   Pulmonary:     Effort:  Pulmonary effort is normal. No respiratory distress.     Breath sounds: Normal breath sounds. No wheezing or rales.  Chest:     Chest wall: No tenderness.  Abdominal:     General: Bowel sounds are normal. There is no distension.     Palpations: Abdomen is soft. There is no mass.     Tenderness: There is no abdominal tenderness. There is no guarding or rebound.  Musculoskeletal: Normal range of motion.        General: No tenderness.  Lymphadenopathy:     Cervical: No cervical adenopathy.  Skin:    General: Skin is warm and dry.     Findings: No rash.  Neurological:     Mental Status: He is alert and oriented to person, place, and time.     Cranial Nerves: No cranial nerve deficit.     Motor: No abnormal muscle tone.     Coordination: Coordination normal.     Gait: Gait normal.     Deep Tendon Reflexes: Reflexes are normal and symmetric.  Psychiatric:        Behavior: Behavior normal.        Thought Content: Thought content normal.        Judgment: Judgment normal.   B costochondral junctions are tender  Lab Results  Component Value Date   WBC 5.7 09/28/2018   HGB 15.3 09/28/2018   HCT 47.1 09/28/2018   PLT 241.0 09/28/2018   GLUCOSE 89 09/28/2018   CHOL 182 10/20/2017   TRIG 103.0 10/20/2017   HDL 42.50 10/20/2017   LDLDIRECT 77.5 12/26/2008   LDLCALC 119 (H) 10/20/2017   ALT 25 09/28/2018   AST 17 09/28/2018   NA 140 09/28/2018   K 4.7 09/28/2018   CL 102 09/28/2018   CREATININE 1.16 09/28/2018   BUN 22 09/28/2018   CO2 29 09/28/2018   TSH 2.43 10/20/2017   PSA 3.00 10/20/2017   HGBA1C 6.0 07/03/2014    Ct Head Wo Contrast  Result Date: 10/04/2018 CLINICAL DATA:  Headaches, epigastric pain EXAM: CT HEAD WITHOUT CONTRAST TECHNIQUE: Contiguous axial images were obtained from the base of the skull through the vertex without intravenous contrast. COMPARISON:  07/20/2015 FINDINGS: Brain: No evidence of acute infarction, hemorrhage, hydrocephalus, extra-axial  collection or mass lesion/mass effect. Vascular: No hyperdense vessel or unexpected calcification. Skull: Normal. Negative for fracture or focal lesion. Sinuses/Orbits: No acute finding. Other: None. IMPRESSION: No acute intracranial pathology. No non-contrast CT findings to explain headaches. Electronically Signed   By: Eddie Candle M.D.   On: 10/04/2018 16:46    Assessment & Plan:   There are no diagnoses linked to this encounter.   No orders of the defined types were placed in this encounter.    Follow-up: No follow-ups on file.  Walker Kehr, MD

## 2018-10-11 NOTE — Assessment & Plan Note (Addendum)
A  cardiac CT scan for calcium scoring GI ref

## 2018-10-11 NOTE — Assessment & Plan Note (Signed)
Per Psychiatry

## 2018-10-11 NOTE — Assessment & Plan Note (Signed)
Ice/heat

## 2018-10-11 NOTE — Assessment & Plan Note (Signed)
Will ref to Dr Carlean Purl

## 2018-10-11 NOTE — Assessment & Plan Note (Signed)
Better labs

## 2018-10-26 ENCOUNTER — Other Ambulatory Visit: Payer: Self-pay

## 2018-10-26 ENCOUNTER — Ambulatory Visit (INDEPENDENT_AMBULATORY_CARE_PROVIDER_SITE_OTHER)
Admission: RE | Admit: 2018-10-26 | Discharge: 2018-10-26 | Disposition: A | Discharge status: Home / Self Care | Payer: Self-pay | Source: Ambulatory Visit | Attending: Internal Medicine | Admitting: Internal Medicine

## 2018-10-26 DIAGNOSIS — E785 Hyperlipidemia, unspecified: Secondary | ICD-10-CM

## 2018-11-02 ENCOUNTER — Inpatient Hospital Stay: Admission: RE | Admit: 2018-11-02 | Payer: Self-pay | Source: Ambulatory Visit

## 2018-12-11 ENCOUNTER — Other Ambulatory Visit: Payer: Self-pay | Admitting: Internal Medicine

## 2018-12-11 MED ORDER — AMPHETAMINE-DEXTROAMPHET ER 10 MG PO CP24: 10.0000 mg | ORAL_CAPSULE | Freq: Every day | ORAL | 0 refills | Status: DC

## 2018-12-17 DIAGNOSIS — M1711 Unilateral primary osteoarthritis, right knee: Secondary | ICD-10-CM | POA: Diagnosis not present

## 2018-12-17 DIAGNOSIS — M19011 Primary osteoarthritis, right shoulder: Secondary | ICD-10-CM | POA: Diagnosis not present

## 2018-12-17 DIAGNOSIS — M19012 Primary osteoarthritis, left shoulder: Secondary | ICD-10-CM | POA: Diagnosis not present

## 2018-12-26 DIAGNOSIS — M1711 Unilateral primary osteoarthritis, right knee: Secondary | ICD-10-CM | POA: Diagnosis not present

## 2019-01-02 DIAGNOSIS — M1711 Unilateral primary osteoarthritis, right knee: Secondary | ICD-10-CM | POA: Diagnosis not present

## 2019-01-09 DIAGNOSIS — M1711 Unilateral primary osteoarthritis, right knee: Secondary | ICD-10-CM | POA: Diagnosis not present

## 2019-01-14 ENCOUNTER — Ambulatory Visit (INDEPENDENT_AMBULATORY_CARE_PROVIDER_SITE_OTHER): Payer: Medicare Other | Admitting: Internal Medicine

## 2019-01-14 ENCOUNTER — Encounter: Payer: Self-pay | Admitting: Internal Medicine

## 2019-01-14 DIAGNOSIS — E291 Testicular hypofunction: Secondary | ICD-10-CM

## 2019-01-14 DIAGNOSIS — K2100 Gastro-esophageal reflux disease with esophagitis, without bleeding: Secondary | ICD-10-CM

## 2019-01-14 DIAGNOSIS — I10 Essential (primary) hypertension: Secondary | ICD-10-CM

## 2019-01-14 DIAGNOSIS — M48062 Spinal stenosis, lumbar region with neurogenic claudication: Secondary | ICD-10-CM

## 2019-01-14 DIAGNOSIS — F339 Major depressive disorder, recurrent, unspecified: Secondary | ICD-10-CM

## 2019-01-14 NOTE — Assessment & Plan Note (Signed)
Continue with Maxide and metoprolol

## 2019-01-14 NOTE — Assessment & Plan Note (Signed)
Loraine is complaining of left-sided sciatica.  He will make an appointment to see Dr. Vertell Limber

## 2019-01-14 NOTE — Assessment & Plan Note (Signed)
[name] [name] is taking Wellbutrin for psychiatrist

## 2019-01-14 NOTE — Assessment & Plan Note (Signed)
Generic testosterone gel Labs in 3 months

## 2019-01-14 NOTE — Assessment & Plan Note (Signed)
On Prilosec

## 2019-01-14 NOTE — Progress Notes (Signed)
Virtual Visit via Video Note  I connected with [name] [name] on 01/14/19 at  8:30 AM EDT by a video enabled telemedicine application and verified that I am speaking with the correct person using two identifiers.   I discussed the limitations of evaluation and management by telemedicine and the availability of in person appointments. The patient expressed understanding and agreed to proceed.  History of Present Illness: We need to follow-up on recent anemia.  There was seen at the [place], apparently had a low hemoglobin and underwent colonoscopy which was normal.  He was placed on iron sulfate by his gastroenterologist to take 3 times a week.  They lost weight on a diet and now he is at 178 pounds.  He was 220 pounds a few months ago.  He is on Noom diet with his wife follow-up.  Follow-up hypogonadism, ED, depression.  He was put on bupropion SR 150 mg twice a day by his psychiatrist.  Feeling better  follow-up back pain  There has been no runny nose, cough, chest pain, shortness of breath, abdominal pain, diarrhea, constipation, arthralgias, skin rashes.   Observations/Objective: The patient appears to be in no acute distress, looks well.  Assessment and Plan:  See my Assessment and Plan. Follow Up Instructions:    I discussed the assessment and treatment plan with the patient. The patient was provided an opportunity to ask questions and all were answered. The patient agreed with the plan and demonstrated an understanding of the instructions.   The patient was advised to call back or seek an in-person evaluation if the symptoms worsen or if the condition fails to improve as anticipated.  I provided face-to-face time during this encounter. We were at different locations.   Walker Kehr, MD

## 2019-03-19 ENCOUNTER — Telehealth: Payer: Self-pay | Admitting: Internal Medicine

## 2019-03-19 NOTE — Telephone Encounter (Signed)
Relation to pt: self  Call back number: [phone]    Reason for call:  Patient states Medicare was never billed for 01/14/2019 date of service. As per patient  Medicare is primary, BCBS secondary, [employer] and 3rd please update chart

## 2019-03-26 NOTE — Telephone Encounter (Signed)
Spoke with patient in regard.  He has also spoke with billing.

## 2019-04-10 DIAGNOSIS — M542 Cervicalgia: Secondary | ICD-10-CM | POA: Diagnosis not present

## 2019-04-10 DIAGNOSIS — M1711 Unilateral primary osteoarthritis, right knee: Secondary | ICD-10-CM | POA: Diagnosis not present

## 2019-04-10 DIAGNOSIS — M19012 Primary osteoarthritis, left shoulder: Secondary | ICD-10-CM | POA: Diagnosis not present

## 2019-04-10 DIAGNOSIS — M19011 Primary osteoarthritis, right shoulder: Secondary | ICD-10-CM | POA: Diagnosis not present

## 2019-04-17 ENCOUNTER — Telehealth: Payer: Self-pay | Admitting: Internal Medicine

## 2019-04-17 NOTE — Telephone Encounter (Signed)
Recv'd records from [facility]  forwarded 3 pages to Dr. Alain Marion 9/2/20fbg

## 2019-06-05 MED ORDER — HYDROCODONE-ACETAMINOPHEN 5-325 MG PO TABS: 1.0000 | ORAL_TABLET | Freq: Two times a day (BID) | ORAL | 0 refills | Status: DC | PRN

## 2019-06-05 MED ORDER — AMPHETAMINE-DEXTROAMPHET ER 10 MG PO CP24: 10.0000 mg | ORAL_CAPSULE | Freq: Every day | ORAL | 0 refills | Status: DC

## 2019-07-03 ENCOUNTER — Other Ambulatory Visit: Payer: Self-pay | Admitting: Internal Medicine

## 2019-07-03 MED ORDER — SUMATRIPTAN SUCCINATE 100 MG PO TABS: 100.0000 mg | ORAL_TABLET | Freq: Once | ORAL | 1 refills | Status: DC

## 2019-08-02 ENCOUNTER — Telehealth: Payer: Medicare Other

## 2019-08-06 DIAGNOSIS — Z888 Allergy status to other drugs, medicaments and biological substances status: Secondary | ICD-10-CM | POA: Diagnosis not present

## 2019-08-06 DIAGNOSIS — R1084 Generalized abdominal pain: Secondary | ICD-10-CM | POA: Diagnosis not present

## 2019-08-27 DIAGNOSIS — Z1231 Encounter for screening mammogram for malignant neoplasm of breast: Secondary | ICD-10-CM | POA: Diagnosis not present

## 2019-09-29 ENCOUNTER — Other Ambulatory Visit: Payer: Self-pay | Admitting: Internal Medicine

## 2019-09-29 MED ORDER — AMPHETAMINE-DEXTROAMPHET ER 10 MG PO CP24: 10.0000 mg | ORAL_CAPSULE | Freq: Every day | ORAL | 0 refills | Status: DC

## 2019-10-29 ENCOUNTER — Ambulatory Visit (INDEPENDENT_AMBULATORY_CARE_PROVIDER_SITE_OTHER): Payer: Medicare Other | Admitting: Internal Medicine

## 2019-10-29 ENCOUNTER — Encounter: Payer: Self-pay | Admitting: Internal Medicine

## 2019-10-29 ENCOUNTER — Telehealth: Payer: Self-pay

## 2019-10-29 DIAGNOSIS — E785 Hyperlipidemia, unspecified: Secondary | ICD-10-CM | POA: Diagnosis not present

## 2019-10-29 DIAGNOSIS — F4323 Adjustment disorder with mixed anxiety and depressed mood: Secondary | ICD-10-CM | POA: Diagnosis not present

## 2019-10-29 DIAGNOSIS — I10 Essential (primary) hypertension: Secondary | ICD-10-CM

## 2019-10-29 DIAGNOSIS — M48062 Spinal stenosis, lumbar region with neurogenic claudication: Secondary | ICD-10-CM | POA: Diagnosis not present

## 2019-10-29 DIAGNOSIS — E291 Testicular hypofunction: Secondary | ICD-10-CM

## 2019-10-29 MED ORDER — SUMATRIPTAN SUCCINATE 100 MG PO TABS: 100.0000 mg | ORAL_TABLET | Freq: Once | ORAL | 3 refills | Status: DC

## 2019-10-29 MED ORDER — AMPHETAMINE-DEXTROAMPHETAMINE 10 MG PO TABS: 10.0000 mg | ORAL_TABLET | Freq: Every day | ORAL | 0 refills | Status: DC | PRN

## 2019-10-29 MED ORDER — PANTOPRAZOLE SODIUM 40 MG PO TBEC: 40.0000 mg | DELAYED_RELEASE_TABLET | Freq: Every day | ORAL | 3 refills | Status: AC

## 2019-10-29 NOTE — Assessment & Plan Note (Signed)
BP OK at home - 120/80

## 2019-10-29 NOTE — Progress Notes (Signed)
Virtual Visit via Video Note  I connected with [name] [name] on 10/29/19 at  8:50 AM EDT by a video enabled telemedicine application and verified that I am speaking with the correct person using two identifiers.   I discussed the limitations of evaluation and management by telemedicine and the availability of in person appointments. The patient expressed understanding and agreed to proceed.  History of Present Illness: We need to follow-up on ADD - needs Adderall 10 mg regular in am (as before), GERD, anemia, depression  There has been no runny nose, cough, chest pain, shortness of breath, abdominal pain, diarrhea, constipation, arthralgias, skin rashes.   Observations/Objective: The patient appears to be in no acute distress, looks well.  Assessment and Plan:  See my Assessment and Plan. Follow Up Instructions:    I discussed the assessment and treatment plan with the patient. The patient was provided an opportunity to ask questions and all were answered. The patient agreed with the plan and demonstrated an understanding of the instructions.   The patient was advised to call back or seek an in-person evaluation if the symptoms worsen or if the condition fails to improve as anticipated.  I provided face-to-face time during this encounter. We were at different locations.   Walker Kehr, MD

## 2019-10-29 NOTE — Assessment & Plan Note (Signed)
Stable F/u w/psychiatry

## 2019-10-29 NOTE — Assessment & Plan Note (Signed)
Norco prn  Potential benefits of a long term opioids use as well as potential risks (i.e. addiction risk, apnea etc) and complications (i.e. Somnolence, constipation and others) were explained to the patient and were aknowledged. 

## 2019-10-29 NOTE — Telephone Encounter (Signed)
New message   The patient was seen today .   Medication needs prior authorization amphetamine-dextroamphetamine (ADDERALL) 10 MG tablet  [place], [place] - [place]

## 2019-10-29 NOTE — Assessment & Plan Note (Signed)
  On diet  

## 2019-10-29 NOTE — Assessment & Plan Note (Signed)
F/u w/Urology 

## 2019-10-30 NOTE — Telephone Encounter (Signed)
Faxed in yesterday. 

## 2019-11-04 ENCOUNTER — Telehealth: Payer: Self-pay

## 2019-11-04 NOTE — Telephone Encounter (Signed)
New message   Need prior authorization amphetamine-dextroamphetamine (ADDERALL) 10 MG tablet    [place], [place] - [place]

## 2019-11-07 NOTE — Telephone Encounter (Signed)
Ok Thx 

## 2019-11-07 NOTE — Telephone Encounter (Signed)
New Message:   Pt is calling to check on his prior authorization for his Adderall for [employer](1st) and [employer] (2nd). Please advise.

## 2019-11-08 NOTE — Telephone Encounter (Signed)
LMTCB  PA done 06/12/19 [facility] [employer]  received a request from your provider for coverage of AmphetamineDextroamphet ER 10MG  OR CP24. As long as you remain covered by the [facility] and there are no changes to your plan benefits, this request is approved for the following time period: 06/12/2019 - 06/11/2022  Called pharmacy and was given same drug coverage, need updated drug coverage info

## 2019-11-08 NOTE — Telephone Encounter (Signed)
PA approved, pt notified

## 2019-11-21 MED ORDER — AMPHETAMINE-DEXTROAMPHETAMINE 10 MG PO TABS: 10.0000 mg | ORAL_TABLET | Freq: Every day | ORAL | 0 refills | Status: DC | PRN

## 2019-12-27 NOTE — Telephone Encounter (Signed)
 Patient states he will have moved out of state before the first available appointment in September. Patient declined to schedule

## 2020-01-30 ENCOUNTER — Ambulatory Visit: Payer: Medicare Other | Admitting: Internal Medicine

## 2020-02-03 ENCOUNTER — Telehealth (INDEPENDENT_AMBULATORY_CARE_PROVIDER_SITE_OTHER): Payer: Medicare Other | Admitting: Internal Medicine

## 2020-02-03 ENCOUNTER — Encounter: Payer: Self-pay | Admitting: Internal Medicine

## 2020-02-03 DIAGNOSIS — M8949 Other hypertrophic osteoarthropathy, multiple sites: Secondary | ICD-10-CM | POA: Diagnosis not present

## 2020-02-03 DIAGNOSIS — Z6379 Other stressful life events affecting family and household: Secondary | ICD-10-CM

## 2020-02-03 DIAGNOSIS — I10 Essential (primary) hypertension: Secondary | ICD-10-CM | POA: Diagnosis not present

## 2020-02-03 DIAGNOSIS — F339 Major depressive disorder, recurrent, unspecified: Secondary | ICD-10-CM | POA: Diagnosis not present

## 2020-02-03 DIAGNOSIS — K580 Irritable bowel syndrome with diarrhea: Secondary | ICD-10-CM

## 2020-02-03 DIAGNOSIS — M159 Polyosteoarthritis, unspecified: Secondary | ICD-10-CM

## 2020-02-03 MED ORDER — HYDROCODONE-ACETAMINOPHEN 5-325 MG PO TABS: 1.0000 | ORAL_TABLET | Freq: Two times a day (BID) | ORAL | 0 refills | Status: DC | PRN

## 2020-02-03 MED ORDER — AMPHETAMINE-DEXTROAMPHETAMINE 10 MG PO TABS: 10.0000 mg | ORAL_TABLET | Freq: Every day | ORAL | 0 refills | Status: AC | PRN

## 2020-02-03 MED ORDER — AMPHETAMINE-DEXTROAMPHET ER 10 MG PO CP24: 10.0000 mg | ORAL_CAPSULE | Freq: Every day | ORAL | 0 refills | Status: AC

## 2020-02-03 NOTE — Assessment & Plan Note (Signed)
Chronic LBP, shoulders Meloxicam prn Norco prn - renewed  Potential benefits of a long term opioids use as well as potential risks (i.e. addiction risk, apnea etc) and complications (i.e. Somnolence, constipation and others) were explained to the patient and were aknowledged.

## 2020-02-03 NOTE — Assessment & Plan Note (Signed)
Gluten free trial; milk free trial helped

## 2020-02-03 NOTE — Progress Notes (Signed)
Virtual Visit via Video Note  I connected with [name] [name] on 02/03/20 at  8:50 AM EDT by a video enabled telemedicine application and verified that I am speaking with the correct person using two identifiers.   I discussed the limitations of evaluation and management by telemedicine and the availability of in person appointments. The patient expressed understanding and agreed to proceed.  I was located at our [facility] office. The patient was at home. There was no one else present in the visit.   History of Present Illness: We need to follow-up on HTN, chronic pain, anxiety BP 130/69 now  Stress- worse: they are moving to [facility] soon  There has been no runny nose, cough, chest pain, shortness of breath, abdominal pain, diarrhea, constipation, skin rashes.   Observations/Objective: The patient appears to be in no acute distress, looks well.  Assessment and Plan:  See my Assessment and Plan. Follow Up Instructions:    I discussed the assessment and treatment plan with the patient. The patient was provided an opportunity to ask questions and all were answered. The patient agreed with the plan and demonstrated an understanding of the instructions.   The patient was advised to call back or seek an in-person evaluation if the symptoms worsen or if the condition fails to improve as anticipated.  I provided face-to-face time during this encounter. We were at different locations.   Walker Kehr, MD

## 2020-02-03 NOTE — Assessment & Plan Note (Addendum)
130/80 Toprol Maxzide

## 2020-02-03 NOTE — Assessment & Plan Note (Signed)
Stress- worse: they are moving to [facility] soon F/u w/psychiatry

## 2020-02-03 NOTE — Assessment & Plan Note (Signed)
Stress- worse: they are moving to [facility] soon

## 2020-03-01 IMAGING — CT CT HEAD W/O CM
3 series · 15 of 47 positions shown, 18 images · non-contrast
Comparison: 07/20/2015

CLINICAL DATA: Headaches, epigastric pain

EXAM:
CT HEAD WITHOUT CONTRAST
TECHNIQUE: Contiguous axial images were obtained from the base of the skull
through the vertex without intravenous contrast.

[Series 2: head 5.0 h37s · axial · 0.46mm/px · z∈[+1562,+1692]mm · 9 of 32 slices shown, 12 images]
[im 3/32  brain]
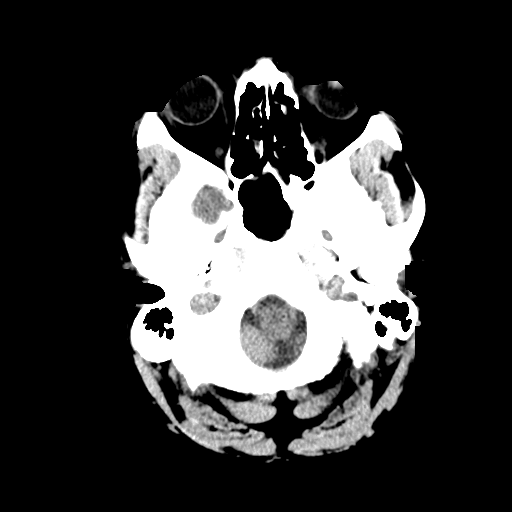
[im 3/32  bone]
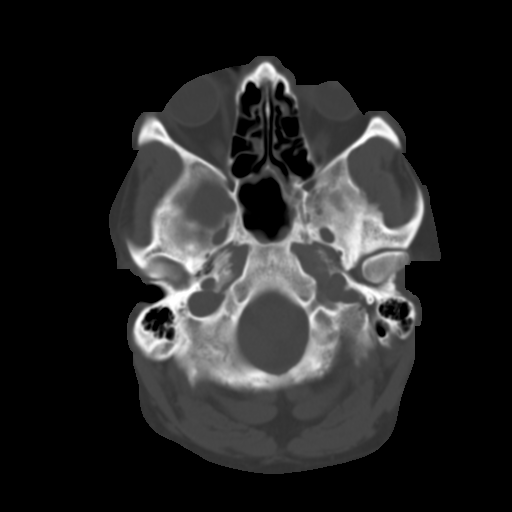
[im 6/32  brain]
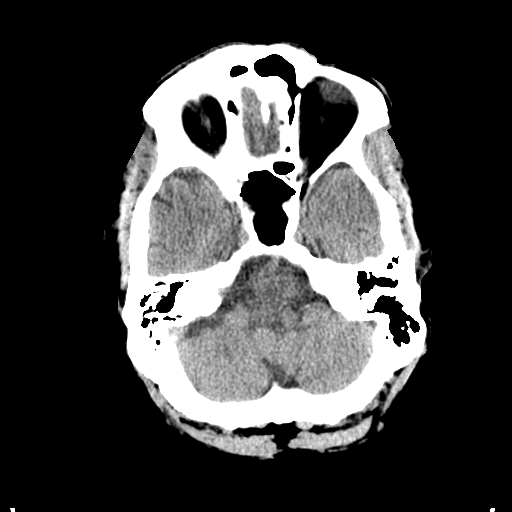
[im 9/32  brain]
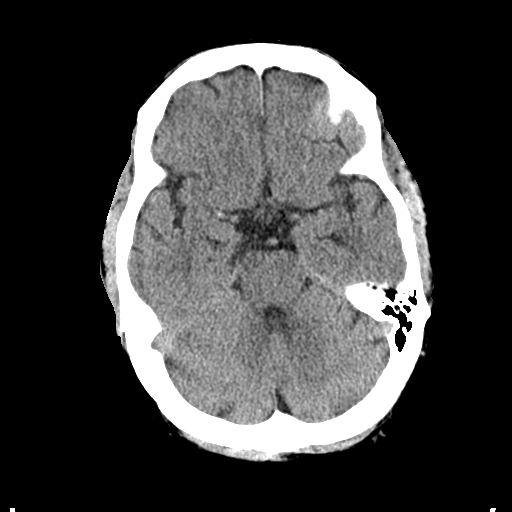
[im 12/32  brain]
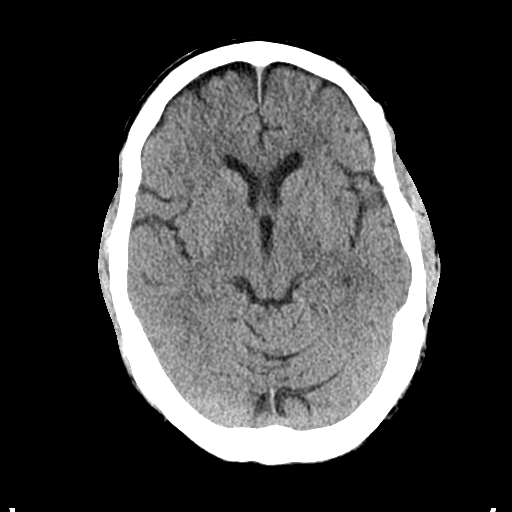
[im 17/32  brain]
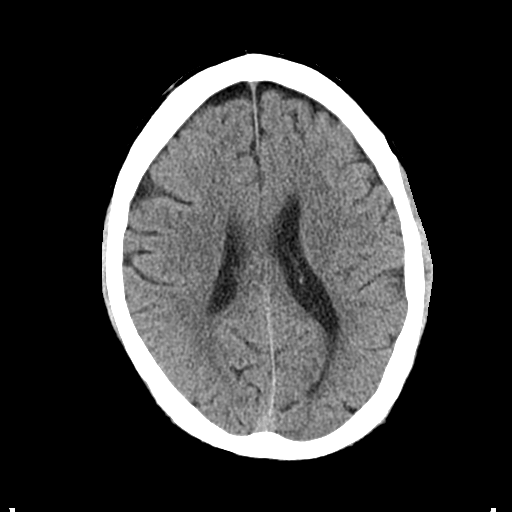
[im 17/32  bone]
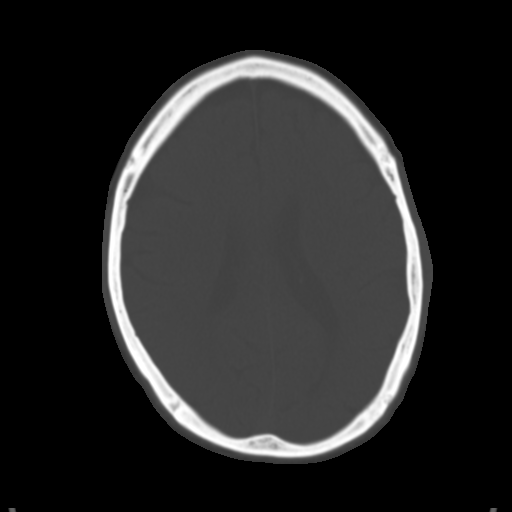
[im 20/32  brain]
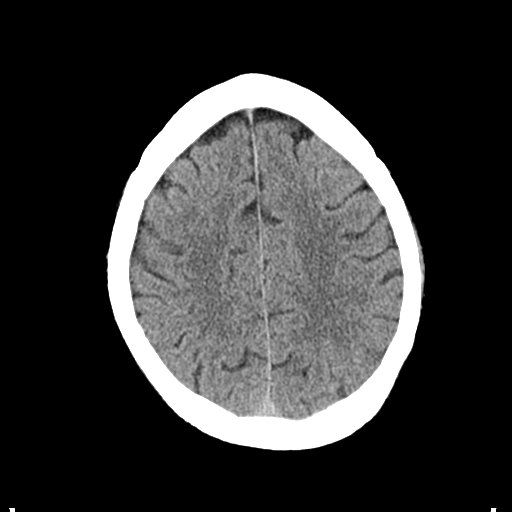
[im 23/32  brain]
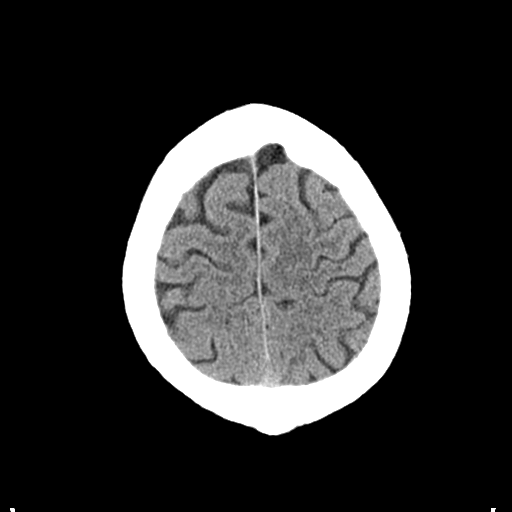
[im 26/32  brain]
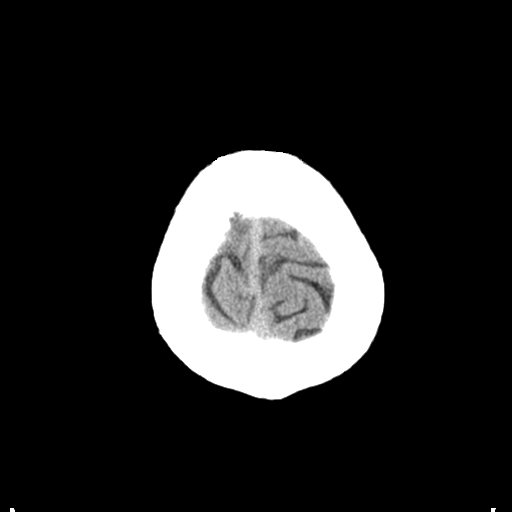
[im 29/32  brain]
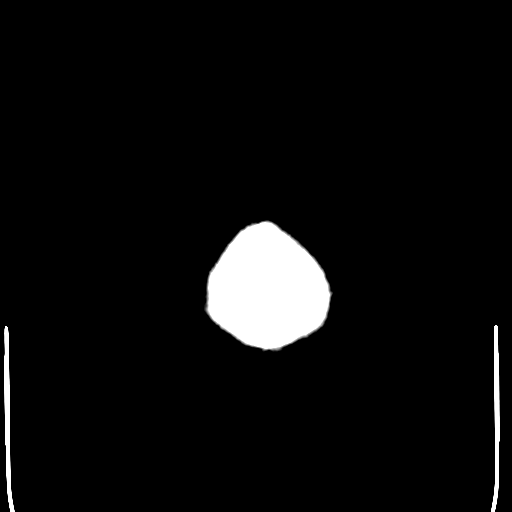
[im 29/32  bone]
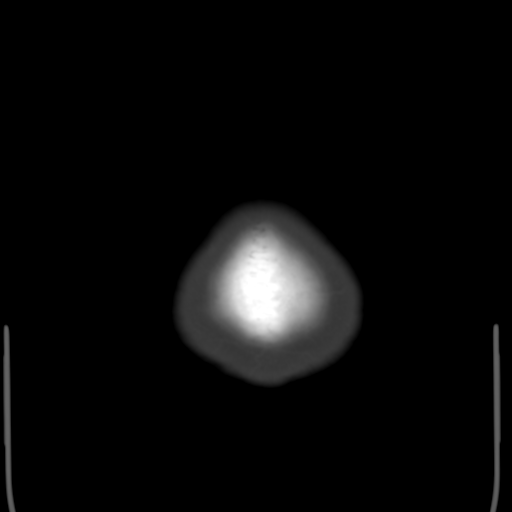

[Series 4: head 3.0 mpr cor · coronal · 0.31mm/px · 3 of 75 slices shown]
[im 25/75  brain]
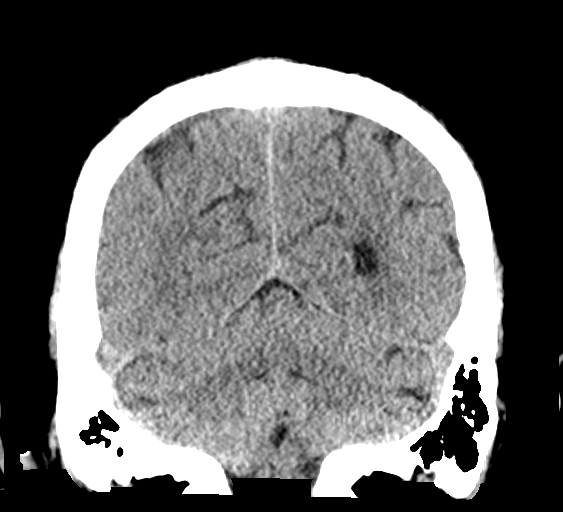
[im 33/75  brain]
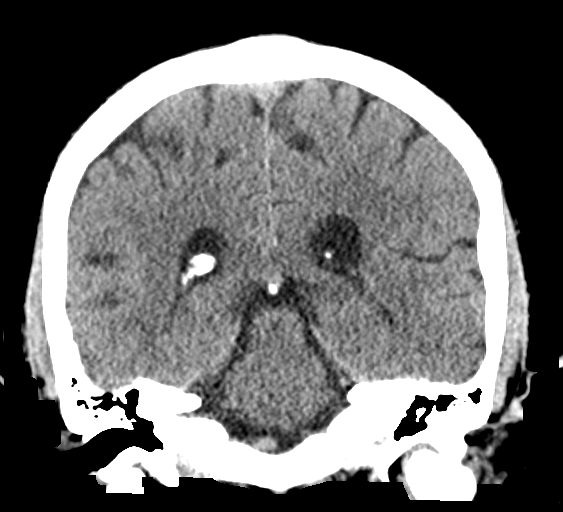
[im 42/75  brain]
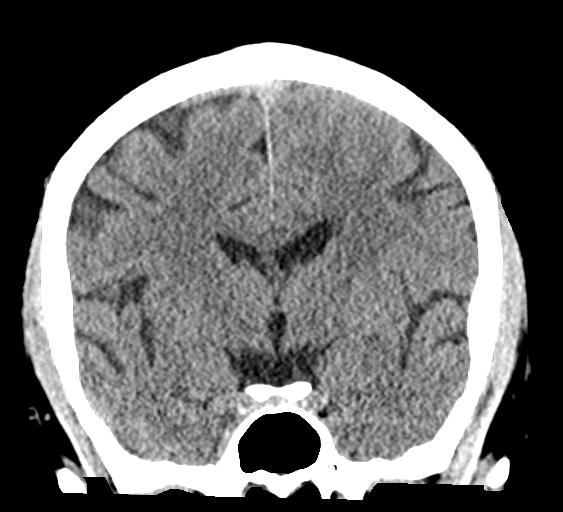

[Series 5: head 3.0 mpr sag · sagittal · 0.32mm/px · 3 of 59 slices shown]
[im 20/59  brain]
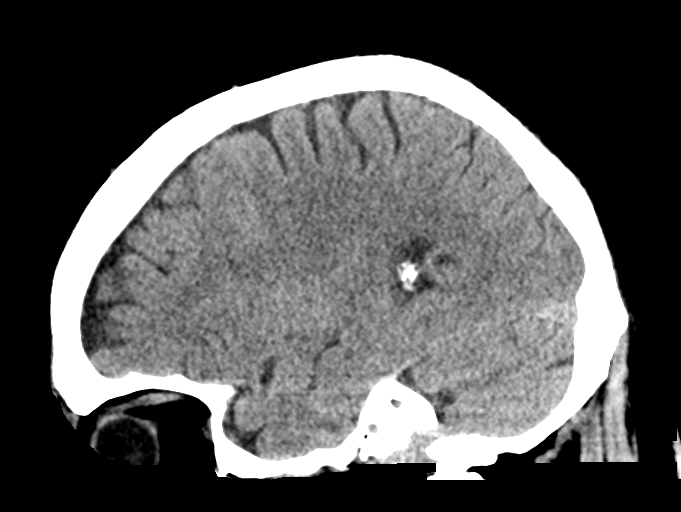
[im 30/59  brain]
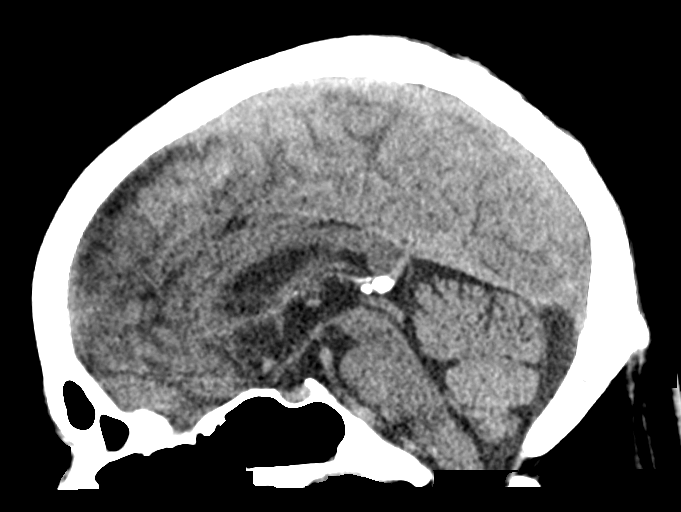
[im 39/59  brain]
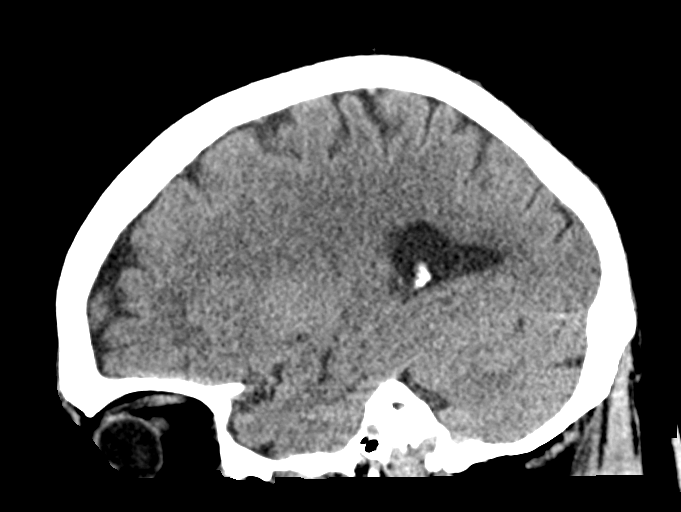

[15 of 47 positions shown; findings below may reference images not displayed]

FINDINGS: Brain: No evidence of acute infarction, hemorrhage, hydrocephalus,
extra-axial collection or mass lesion/mass effect.

Vascular: No hyperdense vessel or unexpected calcification.

Skull: Normal. Negative for fracture or focal lesion.

Sinuses/Orbits: No acute finding.

Other: None.
IMPRESSION: No acute intracranial pathology. No non-contrast CT findings to
explain headaches.

## 2020-06-06 ENCOUNTER — Other Ambulatory Visit: Payer: Self-pay | Admitting: Internal Medicine

## 2020-06-06 MED ORDER — HYDROCODONE-ACETAMINOPHEN 5-325 MG PO TABS: 1.0000 | ORAL_TABLET | Freq: Two times a day (BID) | ORAL | 0 refills | Status: AC | PRN

## 2020-08-24 ENCOUNTER — Other Ambulatory Visit: Payer: Self-pay | Admitting: Internal Medicine

## 2020-08-24 MED ORDER — TADALAFIL 5 MG PO TABS: 5.0000 mg | ORAL_TABLET | Freq: Every day | ORAL | 3 refills | Status: DC

## 2020-12-18 ENCOUNTER — Other Ambulatory Visit: Payer: Self-pay | Admitting: Internal Medicine

## 2020-12-21 ENCOUNTER — Other Ambulatory Visit: Payer: Self-pay | Admitting: Internal Medicine

## 2021-01-20 ENCOUNTER — Other Ambulatory Visit (HOSPITAL_COMMUNITY): Payer: Self-pay

## 2021-01-20 DIAGNOSIS — F32A Depression, unspecified: Secondary | ICD-10-CM

## 2021-01-20 DIAGNOSIS — F909 Attention-deficit hyperactivity disorder, unspecified type: Secondary | ICD-10-CM

## 2021-01-20 DIAGNOSIS — F319 Bipolar disorder, unspecified: Secondary | ICD-10-CM

## 2021-04-05 ENCOUNTER — Ambulatory Visit (INDEPENDENT_AMBULATORY_CARE_PROVIDER_SITE_OTHER): Payer: 59 | Admitting: Clinical Neuropsychologist

## 2021-04-05 ENCOUNTER — Other Ambulatory Visit: Payer: Self-pay

## 2021-04-05 DIAGNOSIS — F419 Anxiety disorder, unspecified: Secondary | ICD-10-CM

## 2021-04-05 DIAGNOSIS — F909 Attention-deficit hyperactivity disorder, unspecified type: Secondary | ICD-10-CM

## 2021-04-05 DIAGNOSIS — F09 Unspecified mental disorder due to known physiological condition: Secondary | ICD-10-CM

## 2021-04-05 DIAGNOSIS — R41840 Attention and concentration deficit: Secondary | ICD-10-CM

## 2021-04-05 DIAGNOSIS — F319 Bipolar disorder, unspecified: Secondary | ICD-10-CM

## 2021-04-05 DIAGNOSIS — F32A Depression, unspecified: Secondary | ICD-10-CM

## 2021-04-08 ENCOUNTER — Inpatient Hospital Stay
Admission: EM | Admit: 2021-04-08 | Discharge: 2021-04-12 | DRG: 387 | Disposition: A | Discharge status: Home / Self Care | Payer: 59 | Attending: NURSE PRACTITIONER | Admitting: NURSE PRACTITIONER

## 2021-04-08 ENCOUNTER — Other Ambulatory Visit: Payer: Self-pay

## 2021-04-08 ENCOUNTER — Inpatient Hospital Stay (HOSPITAL_COMMUNITY): Payer: Non-veteran care | Admitting: Internal Medicine

## 2021-04-08 ENCOUNTER — Encounter (HOSPITAL_COMMUNITY): Payer: Self-pay

## 2021-04-08 ENCOUNTER — Emergency Department (HOSPITAL_COMMUNITY): Payer: 59

## 2021-04-08 DIAGNOSIS — K5903 Drug induced constipation: Secondary | ICD-10-CM | POA: Diagnosis present

## 2021-04-08 DIAGNOSIS — Z20822 Contact with and (suspected) exposure to covid-19: Secondary | ICD-10-CM | POA: Diagnosis present

## 2021-04-08 DIAGNOSIS — I10 Essential (primary) hypertension: Secondary | ICD-10-CM | POA: Diagnosis present

## 2021-04-08 DIAGNOSIS — F32A Depression, unspecified: Secondary | ICD-10-CM | POA: Diagnosis present

## 2021-04-08 DIAGNOSIS — K922 Gastrointestinal hemorrhage, unspecified: Secondary | ICD-10-CM

## 2021-04-08 DIAGNOSIS — Z981 Arthrodesis status: Secondary | ICD-10-CM

## 2021-04-08 DIAGNOSIS — G43909 Migraine, unspecified, not intractable, without status migrainosus: Secondary | ICD-10-CM | POA: Diagnosis present

## 2021-04-08 DIAGNOSIS — K219 Gastro-esophageal reflux disease without esophagitis: Secondary | ICD-10-CM | POA: Diagnosis present

## 2021-04-08 DIAGNOSIS — K625 Hemorrhage of anus and rectum: Secondary | ICD-10-CM

## 2021-04-08 DIAGNOSIS — Z820 Family history of epilepsy and other diseases of the nervous system: Secondary | ICD-10-CM

## 2021-04-08 DIAGNOSIS — F17200 Nicotine dependence, unspecified, uncomplicated: Secondary | ICD-10-CM | POA: Insufficient documentation

## 2021-04-08 DIAGNOSIS — K0889 Other specified disorders of teeth and supporting structures: Secondary | ICD-10-CM | POA: Diagnosis present

## 2021-04-08 DIAGNOSIS — K529 Noninfective gastroenteritis and colitis, unspecified: Secondary | ICD-10-CM

## 2021-04-08 DIAGNOSIS — F39 Unspecified mood [affective] disorder: Secondary | ICD-10-CM

## 2021-04-08 DIAGNOSIS — K51811 Other ulcerative colitis with rectal bleeding: Principal | ICD-10-CM | POA: Diagnosis present

## 2021-04-08 DIAGNOSIS — Z79899 Other long term (current) drug therapy: Secondary | ICD-10-CM

## 2021-04-08 DIAGNOSIS — Z720 Tobacco use: Secondary | ICD-10-CM

## 2021-04-08 DIAGNOSIS — T402X5A Adverse effect of other opioids, initial encounter: Secondary | ICD-10-CM | POA: Diagnosis present

## 2021-04-08 DIAGNOSIS — K648 Other hemorrhoids: Secondary | ICD-10-CM | POA: Diagnosis present

## 2021-04-08 DIAGNOSIS — E785 Hyperlipidemia, unspecified: Secondary | ICD-10-CM | POA: Diagnosis present

## 2021-04-08 DIAGNOSIS — K921 Melena: Secondary | ICD-10-CM

## 2021-04-08 DIAGNOSIS — F17290 Nicotine dependence, other tobacco product, uncomplicated: Secondary | ICD-10-CM

## 2021-04-08 DIAGNOSIS — N400 Enlarged prostate without lower urinary tract symptoms: Secondary | ICD-10-CM | POA: Diagnosis present

## 2021-04-08 HISTORY — DX: Gastro-esophageal reflux disease without esophagitis: K21.9

## 2021-04-08 HISTORY — DX: Essential (primary) hypertension: I10

## 2021-04-08 HISTORY — DX: Headache, unspecified: R51.9

## 2021-04-08 HISTORY — DX: Benign prostatic hyperplasia without lower urinary tract symptoms: N40.0

## 2021-04-08 HISTORY — DX: Male erectile dysfunction, unspecified: N52.9

## 2021-04-08 LAB — ECG 12 LEAD
Atrial Rate: 89 {beats}/min
Calculated P Axis: 80 degrees
Calculated R Axis: 55 degrees
Calculated T Axis: 28 degrees
PR Interval: 158 ms
QRS Duration: 86 ms
QT Interval: 368 ms
QTC Calculation: 447 ms
Ventricular rate: 89 {beats}/min

## 2021-04-08 LAB — H & H
HCT: 44.6 % (ref 38.9–52.0)
HCT: 45.6 % (ref 38.9–52.0)
HGB: 14.8 g/dL (ref 13.4–17.5)
HGB: 15.2 g/dL (ref 13.4–17.5)

## 2021-04-08 LAB — URINALYSIS, MACROSCOPIC
BILIRUBIN: NOT DETECTED mg/dL
BLOOD: NOT DETECTED mg/dL
GLUCOSE: NOT DETECTED mg/dL
LEUKOCYTES: NOT DETECTED WBCs/uL
NITRITE: NOT DETECTED
PH: 7.5 (ref 5.0–?)
PROTEIN: NOT DETECTED mg/dL
SPECIFIC GRAVITY: 1.01 (ref 1.005–?)
UROBILINOGEN: 0.2 mg/dL

## 2021-04-08 LAB — COMPREHENSIVE METABOLIC PANEL, NON-FASTING
ALBUMIN: 3.8 g/dL (ref 3.4–4.8)
ALKALINE PHOSPHATASE: 63 U/L (ref 45–115)
ALT (SGPT): 19 U/L (ref 10–55)
ANION GAP: 12 mmol/L (ref 4–13)
AST (SGOT): 19 U/L (ref 8–45)
BILIRUBIN TOTAL: 0.8 mg/dL (ref 0.3–1.3)
BUN/CREA RATIO: 20 (ref 6–22)
BUN: 20 mg/dL (ref 8–25)
CALCIUM: 9.4 mg/dL (ref 8.8–10.2)
CHLORIDE: 101 mmol/L (ref 96–111)
CO2 TOTAL: 21 mmol/L — ABNORMAL LOW (ref 23–31)
CREATININE: 0.98 mg/dL (ref 0.75–1.35)
ESTIMATED GFR: 85 mL/min/BSA (ref >=60)
GLUCOSE: 104 mg/dL (ref 65–125)
POTASSIUM: 4.1 mmol/L (ref 3.5–5.1)
PROTEIN TOTAL: 7.1 g/dL (ref 6.0–8.0)
SODIUM: 134 mmol/L — ABNORMAL LOW (ref 136–145)

## 2021-04-08 LAB — TYPE AND SCREEN
ABO/RH(D): A POS
ANTIBODY SCREEN: NEGATIVE

## 2021-04-08 LAB — CBC WITH DIFF
BASOPHIL #: <0.1 10*3/uL (ref <=0.20)
BASOPHIL %: 0 %
EOSINOPHIL #: <0.1 10*3/uL (ref <=0.50)
EOSINOPHIL %: 0 %
HCT: 48 % (ref 38.9–52.0)
HGB: 16.3 g/dL (ref 13.4–17.5)
IMMATURE GRANULOCYTE #: <0.1 10*3/uL (ref <0.10)
IMMATURE GRANULOCYTE %: 0 % (ref 0–1)
LYMPHOCYTE #: 0.67 10*3/uL — ABNORMAL LOW (ref 1.00–4.80)
LYMPHOCYTE %: 7 %
MCH: 31 pg (ref 26.0–32.0)
MCHC: 34 g/dL (ref 31.0–35.5)
MCV: 91.4 fL (ref 78.0–100.0)
MONOCYTE #: 0.93 10*3/uL (ref 0.20–1.10)
MONOCYTE %: 10 %
MPV: 9.6 fL (ref 8.7–12.5)
NEUTROPHIL #: 7.92 10*3/uL — ABNORMAL HIGH (ref 1.50–7.70)
NEUTROPHIL %: 83 %
PLATELETS: 259 10*3/uL (ref 150–400)
RBC: 5.25 10*6/uL (ref 4.50–6.10)
RDW-CV: 13 % (ref 11.5–15.5)
WBC: 9.6 10*3/uL (ref 3.7–11.0)

## 2021-04-08 LAB — C-REACTIVE PROTEIN(CRP),INFLAMMATION: CRP INFLAMMATION: 142.2 mg/L — ABNORMAL HIGH (ref <8.0)

## 2021-04-08 LAB — PTT (PARTIAL THROMBOPLASTIN TIME): APTT: 29.1 seconds (ref 26.0–39.0)

## 2021-04-08 LAB — COVID-19, FLU A/B, RSV RAPID BY PCR
INFLUENZA VIRUS TYPE A: NOT DETECTED
INFLUENZA VIRUS TYPE B: NOT DETECTED
RESPIRATORY SYNCTIAL VIRUS (RSV): NOT DETECTED
SARS-CoV-2: NOT DETECTED

## 2021-04-08 LAB — TROPONIN-I: TROPONIN I: <7 ng/L — ABNORMAL LOW (ref 7–30)

## 2021-04-08 LAB — LACTIC ACID LEVEL W/ REFLEX FOR LEVEL >2.0: LACTIC ACID: 1.2 mmol/L (ref 0.5–2.2)

## 2021-04-08 LAB — SEDIMENTATION RATE: ERYTHROCYTE SEDIMENTATION RATE (ESR): 13 mm/hr (ref 0–15)

## 2021-04-08 LAB — PT/INR
INR: 1.22 (ref <=5.00)
PROTHROMBIN TIME: 14.4 seconds — ABNORMAL HIGH (ref 9.7–13.6)

## 2021-04-08 LAB — LIPASE: LIPASE: 9 U/L — ABNORMAL LOW (ref 10–60)

## 2021-04-08 MED ORDER — ONDANSETRON HCL (PF) 4 MG/2 ML INJECTION SOLUTION
4.0000 mg | Freq: Four times a day (QID) | INTRAMUSCULAR | Status: DC | PRN
Start: 2021-04-08 — End: 2021-04-12
  Administered 2021-04-08: 4 mg via INTRAVENOUS
  Filled 2021-04-08: qty 2

## 2021-04-08 MED ORDER — MORPHINE 4 MG/ML INTRAVENOUS SOLUTION
4.0000 mg | INTRAVENOUS | Status: AC
Start: 2021-04-08 — End: 2021-04-08
  Administered 2021-04-08: 4 mg via INTRAVENOUS
  Filled 2021-04-08: qty 1

## 2021-04-08 MED ORDER — ATORVASTATIN 40 MG TABLET
40.0000 mg | ORAL_TABLET | Freq: Every evening | ORAL | Status: DC
Start: 2021-04-08 — End: 2021-04-12
  Administered 2021-04-08 – 2021-04-11 (×5): 40 mg via ORAL
  Filled 2021-04-08 (×4): qty 1

## 2021-04-08 MED ORDER — SODIUM CHLORIDE 0.9 % (FLUSH) INJECTION SYRINGE
3.0000 mL | INJECTION | Freq: Three times a day (TID) | INTRAMUSCULAR | Status: DC
Start: 2021-04-08 — End: 2021-04-12
  Administered 2021-04-08: 09:00:00 10 mL
  Administered 2021-04-08 (×2): 0 mL
  Administered 2021-04-08: 10 mL
  Administered 2021-04-09 – 2021-04-10 (×5): 0 mL
  Administered 2021-04-10: 3 mL
  Administered 2021-04-11: 0 mL
  Administered 2021-04-11 – 2021-04-12 (×3): 3 mL

## 2021-04-08 MED ORDER — PIPERACILLIN-TAZOBACTAM 3.375 GRAM/50 ML DEXTROSE(ISO-OS) IV PIGGYBACK
3.3750 g | INJECTION | INTRAVENOUS | Status: AC
Start: 2021-04-08 — End: 2021-04-08
  Administered 2021-04-08: 0 g via INTRAVENOUS
  Administered 2021-04-08: 3.375 g via INTRAVENOUS
  Filled 2021-04-08: qty 50

## 2021-04-08 MED ORDER — SODIUM CHLORIDE 0.9 % (FLUSH) INJECTION SYRINGE
3.0000 mL | INJECTION | INTRAMUSCULAR | Status: DC | PRN
Start: 2021-04-08 — End: 2021-04-12

## 2021-04-08 MED ORDER — BISACODYL 5 MG TABLET,DELAYED RELEASE
10.0000 mg | DELAYED_RELEASE_TABLET | Freq: Once | ORAL | Status: AC
Start: 2021-04-08 — End: 2021-04-08
  Administered 2021-04-08: 10 mg via ORAL
  Filled 2021-04-08: qty 2

## 2021-04-08 MED ORDER — MELATONIN 3 MG TABLET
3.0000 mg | ORAL_TABLET | Freq: Every evening | ORAL | Status: DC
Start: 2021-04-08 — End: 2021-04-12
  Administered 2021-04-08 – 2021-04-11 (×5): 3 mg via ORAL
  Filled 2021-04-08 (×4): qty 1

## 2021-04-08 MED ORDER — TAMSULOSIN 0.4 MG CAPSULE
0.4000 mg | ORAL_CAPSULE | Freq: Every evening | ORAL | Status: DC
Start: 2021-04-08 — End: 2021-04-12
  Administered 2021-04-08 – 2021-04-11 (×4): 0.4 mg via ORAL
  Filled 2021-04-08 (×4): qty 1

## 2021-04-08 MED ORDER — CYCLOBENZAPRINE 10 MG TABLET
10.0000 mg | ORAL_TABLET | Freq: Three times a day (TID) | ORAL | Status: DC
Start: 2021-04-08 — End: 2021-04-12
  Administered 2021-04-08 – 2021-04-09 (×4): 10 mg via ORAL
  Administered 2021-04-09: 0 mg via ORAL
  Administered 2021-04-10 – 2021-04-12 (×7): 10 mg via ORAL
  Filled 2021-04-08 (×11): qty 1

## 2021-04-08 MED ORDER — PEG 3350-ELECTROLYTES 236 GRAM-22.74 GRAM-6.74 GRAM-5.86 GRAM SOLUTION
4.0000 L | Freq: Once | ORAL | Status: AC
Start: 2021-04-08 — End: 2021-04-08
  Administered 2021-04-08 (×2): 4000 mL via ORAL
  Filled 2021-04-08: qty 4000

## 2021-04-08 MED ORDER — MAGNESIUM HYDROXIDE 400 MG/5 ML ORAL SUSPENSION
15.0000 mL | Freq: Every day | ORAL | Status: DC | PRN
Start: 2021-04-08 — End: 2021-04-08

## 2021-04-08 MED ORDER — EUGENOL 85 % DENTAL LIQUID
Freq: Once | DENTAL | Status: AC
Start: 2021-04-08 — End: 2021-04-08
  Filled 2021-04-08: qty 3.75

## 2021-04-08 MED ORDER — METRONIDAZOLE 500 MG/100 ML IN SODIUM CHLOR(ISO) INTRAVENOUS PIGGYBACK
500.0000 mg | INJECTION | Freq: Two times a day (BID) | INTRAVENOUS | Status: DC
Start: 2021-04-08 — End: 2021-04-12
  Administered 2021-04-08: 500 mg via INTRAVENOUS
  Administered 2021-04-08 – 2021-04-09 (×2): 0 mg via INTRAVENOUS
  Administered 2021-04-09: 500 mg via INTRAVENOUS
  Administered 2021-04-09: 0 mg via INTRAVENOUS
  Administered 2021-04-09: 500 mg via INTRAVENOUS
  Administered 2021-04-10: 0 mg via INTRAVENOUS
  Administered 2021-04-10 (×2): 500 mg via INTRAVENOUS
  Administered 2021-04-10: 0 mg via INTRAVENOUS
  Administered 2021-04-11: 500 mg via INTRAVENOUS
  Administered 2021-04-11: 0 mg via INTRAVENOUS
  Administered 2021-04-11: 500 mg via INTRAVENOUS
  Administered 2021-04-11: 0 mg via INTRAVENOUS
  Administered 2021-04-12: 500 mg via INTRAVENOUS
  Administered 2021-04-12: 0 mg via INTRAVENOUS
  Filled 2021-04-08 (×10): qty 100

## 2021-04-08 MED ORDER — PANTOPRAZOLE 40 MG TABLET,DELAYED RELEASE
40.0000 mg | DELAYED_RELEASE_TABLET | Freq: Two times a day (BID) | ORAL | Status: DC
Start: 2021-04-08 — End: 2021-04-12
  Administered 2021-04-08: 40 mg via ORAL
  Administered 2021-04-09: 0 mg via ORAL
  Administered 2021-04-09 – 2021-04-12 (×6): 40 mg via ORAL
  Filled 2021-04-08 (×7): qty 1

## 2021-04-08 MED ORDER — METOPROLOL TARTRATE 25 MG TABLET
25.0000 mg | ORAL_TABLET | Freq: Every day | ORAL | Status: DC
Start: 2021-04-09 — End: 2021-04-12
  Administered 2021-04-09 – 2021-04-12 (×4): 25 mg via ORAL
  Filled 2021-04-08 (×4): qty 1

## 2021-04-08 MED ORDER — SODIUM CHLORIDE 0.9 % IV BOLUS
1000.0000 mL | INJECTION | Status: AC
Start: 2021-04-08 — End: 2021-04-08
  Administered 2021-04-08: 0 mL via INTRAVENOUS
  Administered 2021-04-08: 1000 mL via INTRAVENOUS

## 2021-04-08 MED ORDER — BUPROPION HCL SR 150 MG TABLET,12 HR SUSTAINED-RELEASE
300.0000 mg | ORAL_TABLET | Freq: Every day | ORAL | Status: DC
Start: 2021-04-09 — End: 2021-04-12
  Administered 2021-04-09: 0 mg via ORAL
  Administered 2021-04-10 – 2021-04-12 (×3): 300 mg via ORAL
  Filled 2021-04-08 (×3): qty 2

## 2021-04-08 MED ORDER — IOPAMIDOL 370 MG IODINE/ML (76 %) INTRAVENOUS SOLUTION
90.0000 mL | INTRAVENOUS | Status: AC
Start: 2021-04-08 — End: 2021-04-08
  Administered 2021-04-08 (×2): 90 mL via INTRAVENOUS

## 2021-04-08 MED ORDER — GABAPENTIN 300 MG CAPSULE
300.0000 mg | ORAL_CAPSULE | Freq: Two times a day (BID) | ORAL | Status: DC
Start: 2021-04-08 — End: 2021-04-12
  Administered 2021-04-08: 300 mg via ORAL
  Administered 2021-04-09: 0 mg via ORAL
  Administered 2021-04-09 – 2021-04-12 (×6): 300 mg via ORAL
  Filled 2021-04-08 (×7): qty 1

## 2021-04-08 MED ORDER — HYDROCODONE 5 MG-ACETAMINOPHEN 325 MG TABLET
1.0000 | ORAL_TABLET | Freq: Three times a day (TID) | ORAL | Status: DC | PRN
Start: 2021-04-08 — End: 2021-04-12
  Administered 2021-04-08 – 2021-04-12 (×7): 1 via ORAL
  Filled 2021-04-08 (×7): qty 1

## 2021-04-08 MED ORDER — LORATADINE 10 MG TABLET
10.0000 mg | ORAL_TABLET | Freq: Every day | ORAL | Status: DC
Start: 2021-04-09 — End: 2021-04-12
  Administered 2021-04-09 (×2): 0 mg via ORAL
  Administered 2021-04-10 – 2021-04-12 (×3): 10 mg via ORAL
  Filled 2021-04-08 (×3): qty 1

## 2021-04-08 MED ORDER — FINASTERIDE 5 MG TABLET
5.0000 mg | ORAL_TABLET | Freq: Every day | ORAL | Status: DC
Start: 2021-04-08 — End: 2021-04-12
  Administered 2021-04-08: 5 mg via ORAL
  Administered 2021-04-09: 0 mg via ORAL
  Administered 2021-04-10 – 2021-04-12 (×3): 5 mg via ORAL
  Filled 2021-04-08 (×4): qty 1

## 2021-04-08 MED ORDER — CIPROFLOXACIN 400 MG/200 ML IN 5 % DEXTROSE INTRAVENOUS PIGGYBACK
400.0000 mg | INJECTION | Freq: Two times a day (BID) | INTRAVENOUS | Status: DC
Start: 2021-04-08 — End: 2021-04-12
  Administered 2021-04-08: 0 mg via INTRAVENOUS
  Administered 2021-04-08 – 2021-04-09 (×2): 400 mg via INTRAVENOUS
  Administered 2021-04-09: 0 mg via INTRAVENOUS
  Administered 2021-04-09: 400 mg via INTRAVENOUS
  Administered 2021-04-09 – 2021-04-10 (×2): 0 mg via INTRAVENOUS
  Administered 2021-04-10 (×2): 400 mg via INTRAVENOUS
  Administered 2021-04-10 – 2021-04-11 (×2): 0 mg via INTRAVENOUS
  Administered 2021-04-11 (×2): 400 mg via INTRAVENOUS
  Administered 2021-04-11: 0 mg via INTRAVENOUS
  Administered 2021-04-12: 400 mg via INTRAVENOUS
  Administered 2021-04-12: 0 mg via INTRAVENOUS
  Filled 2021-04-08 (×11): qty 200

## 2021-04-08 MED ORDER — POLYETHYLENE GLYCOL 3350 17 GRAM ORAL POWDER PACKET
17.0000 g | Freq: Every day | ORAL | Status: DC
Start: 2021-04-08 — End: 2021-04-12
  Administered 2021-04-08: 17 g via ORAL
  Administered 2021-04-09: 0 g via ORAL
  Administered 2021-04-10 – 2021-04-11 (×2): 17 g via ORAL
  Administered 2021-04-12: 0 g via ORAL
  Filled 2021-04-08 (×3): qty 1

## 2021-04-08 MED ORDER — ONDANSETRON HCL (PF) 4 MG/2 ML INJECTION SOLUTION
4.0000 mg | INTRAMUSCULAR | Status: AC
Start: 2021-04-08 — End: 2021-04-08
  Administered 2021-04-08 (×2): 4 mg via INTRAVENOUS
  Filled 2021-04-08: qty 2

## 2021-04-08 MED ORDER — SODIUM CHLORIDE 0.9 % (FLUSH) INJECTION SYRINGE
3.0000 mL | INJECTION | Freq: Three times a day (TID) | INTRAMUSCULAR | Status: DC
Start: 2021-04-08 — End: 2021-04-12
  Administered 2021-04-08: 3 mL
  Administered 2021-04-08 – 2021-04-09 (×3): 0 mL
  Administered 2021-04-09 – 2021-04-10 (×2): 3 mL
  Administered 2021-04-10 (×2): 0 mL
  Administered 2021-04-11 (×2): 3 mL
  Administered 2021-04-11: 0 mL
  Administered 2021-04-12: 3 mL

## 2021-04-08 MED ORDER — SODIUM CHLORIDE 0.9 % INTRAVENOUS SOLUTION
INTRAVENOUS | Status: DC
Start: 2021-04-08 — End: 2021-04-10

## 2021-04-08 MED ORDER — SIMETHICONE 80 MG CHEWABLE TABLET
80.0000 mg | CHEWABLE_TABLET | Freq: Four times a day (QID) | ORAL | Status: DC | PRN
Start: 2021-04-08 — End: 2021-04-12

## 2021-04-08 MED ORDER — SUMATRIPTAN 50 MG TABLET
100.0000 mg | ORAL_TABLET | Freq: Once | ORAL | Status: DC | PRN
Start: 2021-04-08 — End: 2021-04-12
  Filled 2021-04-08: qty 2

## 2021-04-08 NOTE — Care Management Notes (Signed)
[facility] know [facility] will need and [facility] today. Spoke to [place], [place] does not have any beds today, She will let [facility] know that patient is being admitted to [facility]

## 2021-04-08 NOTE — ED Nurses Note (Signed)
EDMD notified of pt c/o severe generalized abd pain 8/10 and request for topical tooth anesthetic. Pt also informed of small red spots noted to abd which pt reports as new finding PTA; MD identified as hemangiomas.

## 2021-04-08 NOTE — Care Management Notes (Signed)
04/08/21 1336   Assessment Details   Assessment Type Admission   Readmission   Is this a readmission? No   Employment/Financial   Financial Concerns none   Living Environment   Select an age group to open "lives with" row.  Adult   Lives With spouse   Living Arrangements house   Able to Return to Prior Arrangements yes   [place]odd Accessibility no concerns   Care Management Plan   Discharge Planning Status initial meeting   Discharge plan discussed with: Patient   CM will evaluate for rehabilitation potential yes   Referral Information   Admission Type observation   Arrived From home or self-care   [place] Management Initial Evaluation    Patient Name: Sean Wall  Date of Birth: 1955-10-17  Sex: male  Date/Time of Admission: 04/08/2021  8:34 AM  Room/Bed: B8/B8  Payor: VETER[employer] ADMIN / Plan: [employer]MINISTRATION / Product Type: Special Bill /   PCP: [name], MD    Pharmacy Info:   Preferred Pharmacy       None          Emergency Contact Info:   Extended Emergency Contact Information  Primary Emergency Contact: [facility] MARIE  Address: [address] [place] [address] [place] Phone: [phone]  Work Phone: [phone]  Mobile Phone: [phone]  Relation: Wife  Preferred language: English    History:   Sean Wall is a 65 y.o., male, admitted     Height/Weight: 177.8 cm ('5\' 10"'$ ) / 90.7 kg (200 lb)     LOS: 0 days   Admitting Diagnosis: Colitis [K52.9]    Assessment:   Spoke to patient at bedside regarding plan of care and discharge plan. Patient is independent of ADL's and plans to return home when medically stable. Explained that a Case Manager would be following along with plan of care and be available for any discharge needs.    Discharge Plan:  Home (Patient/Family Member/other) (code 1)      The patient will continue to be evaluated for developing discharge needs.     Case Manager: Hal Morales, CASE  MANAGER  Phone: [phone]

## 2021-04-08 NOTE — Care Plan (Signed)
Pt resting in bed. Respirations even and unlabored on RA. Pt complains of abdominal discomfort. Pt made aware of need for stool sample. IVF and ATB infusing per order. Consent completed and on chart for colonoscopy tomorrow. All safety measures maintained. Laurel Dimmer, RN  04/08/2021, 17:51    Problem: Adult Inpatient Plan of Care  Goal: Plan of Care Review  Outcome: Ongoing (see interventions/notes)  Goal: Patient-Specific Goal (Individualized)  Outcome: Ongoing (see interventions/notes)  Goal: Absence of Hospital-Acquired Illness or Injury  Intervention: Identify and Manage Fall Risk  Recent Flowsheet Documentation  Taken 04/08/2021 1530 by Laurel Dimmer, RN  Safety Promotion/Fall Prevention:   activity supervised   fall prevention program maintained  Intervention: Prevent Skin Injury  Recent Flowsheet Documentation  Taken 04/08/2021 1530 by Laurel Dimmer, RN  Body Position: supine, head elevated  Goal: Optimal Comfort and Wellbeing  Intervention: Provide Person-Centered Care  Recent Flowsheet Documentation  Taken 04/08/2021 1530 by Laurel Dimmer, RN  Trust Relationship/Rapport:   care explained   choices provided     Problem: Adjustment to Illness (Gastrointestinal Bleeding)  Goal: Optimal Coping with Acute Illness  Outcome: Ongoing (see interventions/notes)

## 2021-04-08 NOTE — ED Triage Notes (Signed)
Patient arrived via [facility] from home. Patient states that approximately 24 hours ago he started having lower abdominal pain. Patient started having diarrhea during the night and noticed that there was dark red colored blood in his stool. Patient states it was a "significant amount of blood." Patient states he has been taking hydrocodone for a "exposed nerve on a tooth."

## 2021-04-08 NOTE — ED Provider Notes (Signed)
Emergency Department Provider Note  HPI - 04/08/2021    History and Physical Exam     Sean Wall 65 y.o. male  Date of Birth: 14-Feb-1956     Attending: Dr. Hoover Browns    PCP: [name], MD      Chief Complaint   Patient presents with   . Abdominal Pain   . Diarrhea   . Rectal Bleeding     History Provided by:  Patient  HPI:  Sean Wall is a 65 y.o. male  with a past medical history significant for acid reflux, hypertension, and migrianes presents to the ED today for abdominal pain. The patient states throughout the past 24 hours he has been having pain located in the lower portion of his abdomen that has gradually become more bothersome to him. He describes his pain as a constant dull discomfort with a cramping sensation that he rates a 8/10 at today's visit. Patient states that he was constipated yesterday afternoon and manually disimpacted himself. Since then he has been having occasional diarrhea but had become more concerned this morning to having "nothing but blood in the toilet" during his BM. Otherwise, he states he had been doing well and denies nausea, vomiting, dysuria, hematuria, or known fevers.       Review of Systems:    Constitutional: No fever, chills, or weakness.  Skin: No rashes or lesions.  HENT: No head injury. No sore throat, ear pain, or difficulty swallowing.  Eyes: No vision changes, redness, discharge.  Cardio: No chest pain or palpitations.   Respiratory: No cough, wheezing or SOB.  GI: + Abdominal pain + Diarrhea + Constipation.  GU:  No dysuria, hematuria, polyuria.  MSK: No joint pain. No neck or back pain.  Neuro: No headache. No loss of sensation, focal deficits, or LOC.  Psych: No SI, HI or substance abuse.  All other systems reviewed and are negative, unless commented on in the HPI.      Medical History     PMHx:    Medical History     Diagnosis Date Comment Source    Acid reflux       Enlarged prostate       Erectile dysfunction       Headache       HTN (hypertension)           Allergies:    Allergies   Allergen Reactions   . Propofol Rash     Social History  Social History     Tobacco Use   . Smoking status: Light Tobacco Smoker     Types: Cigars   . Smokeless tobacco: Never Used   Substance Use Topics   . Alcohol use: Yes     Alcohol/week: 3.0 standard drinks     Types: 3 Cans of beer per week     Family History  Family Medical History:    None        Home Meds:     Current Facility-Administered Medications:   .  NS flush syringe, 3 mL, Intracatheter, Q8HRS, Childers, Noah S, DO, 10 mL at 04/08/21 0924  .  NS flush syringe, 3 mL, Intracatheter, Q1H PRN, Childers, Noah S, DO    Current Outpatient Medications:   .  atorvastatin (LIPITOR) 40 mg Oral Tablet, Take 40 mg by mouth Every evening, Disp: , Rfl:   .  azelastine-fluticasone (DYMISTA) 137-50 mcg/spray Nasal Spray, Non-Aerosol, Administer into affected nostril(s), Disp: , Rfl:   .  buPROPion (WELLBUTRIN SR) 150 mg Oral tablet sustained-release 12 hr, Take 300 mg by mouth Once a day, Disp: , Rfl:   .  cetirizine (ZYRTEC) 10 mg Oral Tablet, Take 10 mg by mouth Once a day, Disp: , Rfl:   .  cyanocobalamin (VITAMIN B12) 1,000 mcg/mL Injection Solution, 1,000 mcg Once a day, Disp: , Rfl:   .  cyclobenzaprine (FLEXERIL) 10 mg Oral Tablet, Take 10 mg by mouth Three times a day, Disp: , Rfl:   .  dextroamphetamine-amphetamine (ADDERALL) 10 mg Oral Tablet, Take 10 mg by mouth Once per day as needed, Disp: , Rfl:   .  ferrous sulfate (FERATAB) 324 mg (65 mg iron) Oral Tablet, Delayed Release (E.C.), Take 324 mg by mouth Every other day, Disp: , Rfl:   .  finasteride (PROSCAR) 5 mg Oral Tablet, Take 5 mg by mouth Once a day, Disp: , Rfl:   .  gabapentin (NEURONTIN) 300 mg Oral Capsule, Take 300 mg by mouth Twice daily, Disp: , Rfl:   .  HYDROcodone-acetaminophen (NORCO) 5-325 mg Oral Tablet, Take 1 Tablet by mouth Every 8 hours as needed for Pain, Disp: , Rfl:   .  Ibuprofen (MOTRIN) 600 mg Oral Tablet, Take 600 mg by mouth Four times a  day as needed for Pain, Disp: , Rfl:   .  melatonin 3 mg Oral Capsule, Take 6 mg by mouth, Disp: , Rfl:   .  metoprolol tartrate (LOPRESSOR) 25 mg Oral Tablet, Take 25 mg by mouth Once a day, Disp: , Rfl:   .  pantoprazole (PROTONIX) 20 mg Oral Tablet, Delayed Release (E.C.), Take 20 mg by mouth Every morning before breakfast, Disp: , Rfl:   .  QUEtiapine (SEROQUEL) 25 mg Oral Tablet, Take 25 mg by mouth Once per day as needed, Disp: , Rfl:   .  simethicone (MYLICON) 80 mg Oral Tablet, Chewable, Chew 80 mg Every 6 hours as needed, Disp: , Rfl:   .  sumatriptan succinate (IMITREX) 100 mg Oral Tablet, Take 100 mg by mouth Once, as needed for Migraine May repeat in 2 hours in needed, Disp: , Rfl:   .  Tadalafil (CIALIS) 10 mg Oral Tablet, Take 10 mg by mouth Every 24 hours as needed, Disp: , Rfl:   .  Tadalafil (CIALIS) 10 mg Oral Tablet, Take 10 mg by mouth Every 24 hours as needed, Disp: , Rfl:   .  tamsulosin (FLOMAX) 0.4 mg Oral Capsule, Take 0.4 mg by mouth Every evening after dinner, Disp: , Rfl:   .  Testosterone 1 % (25 mg/2.5gram) Transdermal Gel in Packet, Place on the skin, Disp: , Rfl:           Exam and Objective Findings     Physical Exam  Nursing note and vitals reviewed.  Vital signs reviewed as above.     Constitutional: Pt is awake, alert, in no acute distress and no obvious discomfort.   Head: Normocephalic and atraumatic.   Eyes: Conjunctivae are normal. Pupils are equal, round, and reactive to light.  Mouth: Moist mucous membranes.  Cardiovascular: RRR.  No Murmurs, gallops or rubs. Distal pulses present bilaterally.  Pulmonary/Chest: Clear to auscultation bilaterally. No audible wheezes or rhonchi.   GI: Soft, nontender, nondistended. No rebound, guarding, or masses.   Musculoskeletal: 5/5 muscle strength noted in all extremities with intact sensation. Normal range of motion with no deformities. Exhibits no edema and no tenderness.   Skin: Capillary refill < 3 seconds. Warm and  dry. No rash or  lesions.  Neurological: Patient is oriented x3. CNs 2-12 grossly intact.  Psychiatric: Patient has a normal mood and affect.     Work-up:  Orders Placed This Encounter   . ADULT ROUTINE BLOOD CULTURE, SET OF 2 ADULT BOTTLES (BACTERIA AND YEAST)   . ADULT ROUTINE BLOOD CULTURE, SET OF 2 ADULT BOTTLES (BACTERIA AND YEAST)   . CT ABDOMEN PELVIS W IV CONTRAST   . CBC/DIFF   . COMPREHENSIVE METABOLIC PANEL, NON-FASTING   . LIPASE   . URINALYSIS, MACROSCOPIC   . CBC WITH DIFF   . TROPONIN-I   . PT/INR   . PTT (PARTIAL THROMBOPLASTIN TIME)   . COVID-19, FLU A/B, RSV RAPID BY PCR   . LACTIC ACID LEVEL W/ REFLEX FOR LEVEL >2.0   . ECG 12 LEAD   . PERFORM POC ISTAT CREATININE POINT OF CARE   . TYPE AND SCREEN   . INSERT & MAINTAIN PERIPHERAL IV ACCESS   . PERIPHERAL IV DRESSING CHANGE   . PATIENT CLASS/LEVEL OF CARE DESIGNATION - [place]   . NS flush syringe   . NS flush syringe   . NS bolus infusion 1,000 mL   . morphine 4 mg/mL injection   . ondansetron (ZOFRAN) 2 mg/mL injection   . iopamidol (ISOVUE-370) 76% infusion   . eugenol 85% dental liquid   . piperacillin-tazobactam (ZOSYN) 3.375 g in iso-osmotic 50 mL premix IVPB   . morphine 4 mg/mL injection        Labs:  Results for orders placed or performed during the hospital encounter of 04/08/21 (from the past 24 hour(s))   CBC/DIFF    Narrative    The following orders were created for panel order CBC/DIFF.  Procedure                               Abnormality         Status                     ---------                               -----------         ------                     CBC WITH [id number]                Abnormal            Final result                 Please view results for these tests on the individual orders.   COMPREHENSIVE METABOLIC PANEL, NON-FASTING   Result Value Ref Range    SODIUM 134 (L) 136 - 145 mmol/L    POTASSIUM 4.1 3.5 - 5.1 mmol/L    CHLORIDE 101 96 - 111 mmol/L    CO2 TOTAL 21 (L) 23 - 31 mmol/L    ANION GAP 12 4 - 13 mmol/L    BUN 20 8 - 25  mg/dL    CREATININE 0.98 0.75 - 1.35 mg/dL    BUN/CREA RATIO 20 6 - 22    ESTIMATED GFR 85 >=60 mL/min/BSA    ALBUMIN 3.8 3.4 - 4.8 g/dL     CALCIUM 9.4 8.8 - 10.2 mg/dL    GLUCOSE 104  65 - 125 mg/dL    ALKALINE PHOSPHATASE 63 45 - 115 U/L    ALT (SGPT) 19 10 - 55 U/L    AST (SGOT)  19 8 - 45 U/L    BILIRUBIN TOTAL 0.8 0.3 - 1.3 mg/dL    PROTEIN TOTAL 7.1 6.0 - 8.0 g/dL   LIPASE   Result Value Ref Range    LIPASE 9 (L) 10 - 60 U/L   URINALYSIS, MACROSCOPIC   Result Value Ref Range    COLOR Yellow Colorless, Straw, Yellow    APPEARANCE Clear Clear    SPECIFIC GRAVITY 1.010 >1.005 - <1.030    PH 7.5 >5.0 - <8.0    PROTEIN Not Detected Not Detected mg/dL    GLUCOSE Not Detected Not Detected mg/dL    KETONES 2+ (A) Not Detected mg/dL    UROBILINOGEN 0.2  Not Detected, 0.2  mg/dL    BILIRUBIN Not Detected Not Detected mg/dL    BLOOD Not Detected Not Detected mg/dL    NITRITE Not Detected Not Detected    LEUKOCYTES Not Detected Not Detected WBCs/uL   CBC WITH DIFF   Result Value Ref Range    WBC 9.6 3.7 - 11.0 x10^3/uL    RBC 5.25 4.50 - 6.10 x10^6/uL    HGB 16.3 13.4 - 17.5 g/dL    HCT 48.0 38.9 - 52.0 %    MCV 91.4 78.0 - 100.0 fL    MCH 31.0 26.0 - 32.0 pg    MCHC 34.0 31.0 - 35.5 g/dL    RDW-CV 13.0 11.5 - 15.5 %    PLATELETS 259 150 - 400 x10^3/uL    MPV 9.6 8.7 - 12.5 fL    NEUTROPHIL % 83 %    LYMPHOCYTE % 7 %    MONOCYTE % 10 %    EOSINOPHIL % 0 %    BASOPHIL % 0 %    NEUTROPHIL # 7.92 (H) 1.50 - 7.70 x10^3/uL    LYMPHOCYTE # 0.67 (L) 1.00 - 4.80 x10^3/uL    MONOCYTE # 0.93 0.20 - 1.10 x10^3/uL    EOSINOPHIL # <0.10 <=0.50 x10^3/uL    BASOPHIL # <0.10 <=0.20 x10^3/uL    IMMATURE GRANULOCYTE % 0 0 - 1 %    IMMATURE GRANULOCYTE # <0.10 <0.10 x10^3/uL   TROPONIN-I   Result Value Ref Range    TROPONIN I <7 (L) 7 - 30 ng/L   PT/INR   Result Value Ref Range    PROTHROMBIN TIME 14.4 (H) 9.7 - 13.6 seconds    INR 1.22 <=5.00   PTT (PARTIAL THROMBOPLASTIN TIME)   Result Value Ref Range    APTT 29.1 26.0 - 39.0 seconds    COVID-19, FLU A/B, RSV RAPID BY PCR   Result Value Ref Range    SARS-CoV-2 Not Detected Not Detected    INFLUENZA VIRUS TYPE A Not Detected Not Detected    INFLUENZA VIRUS TYPE B Not Detected Not Detected    RESPIRATORY SYNCTIAL VIRUS (RSV) Not Detected Not Detected    Narrative    Results are for the simultaneous qualitative identification of SARS-CoV-2 (formerly 2019-nCoV), Influenza A, Influenza B, and RSV RNA. These etiologic agents are generally detectable in nasopharyngeal and nasal swabs during the ACUTE PHASE of infection. Hence, this test is intended to be performed on respiratory specimens collected from individuals with signs and symptoms of upper respiratory tract infection who meet Centers for Disease Control and Prevention (CDC) clinical and/or epidemiological criteria for Coronavirus Disease 2019 (COVID-19) testing.  CDC COVID-19 criteria for testing on human specimens is available at [facility] webpage information for Healthcare Professionals: Coronavirus Disease 2019 (COVID-19) ([email or web address]).     False-negative results may occur if the virus has genomic mutations, insertions, deletions, or rearrangements or if performed very early in the course of illness. Otherwise, negative results indicate virus specific RNA targets are not detected, however negative results do not preclude SARS-CoV-2 infection/COVID-19, Influenza, or Respiratory syncytial virus infection. Results should not be used as the sole basis for patient management decisions. Negative results must be combined with clinical observations, patient history, and epidemiological information. If upper respiratory tract infection is still suspected based on exposure history together with other clinical findings, re-testing should be considered.    Disclaimer:   This assay has been authorized by FDA under an Emergency Use Authorization for use in laboratories certified under the Clinical Laboratory  Improvement Amendments of 1988 (CLIA), 42 U.S.C. [phone], to perform high complexity tests. The impacts of vaccines, antiviral therapeutics, antibiotics, chemotherapeutic or immunosuppressant drugs have not been evaluated.     Test methodology:   Cepheid Xpert Xpress SARS-CoV-2/Flu/RSV Assay real-time polymerase chain reaction (RT-PCR) test on the GeneXpert Dx and Xpert Xpress systems.   LACTIC ACID LEVEL W/ REFLEX FOR LEVEL >2.0   Result Value Ref Range    LACTIC ACID 1.2 0.5 - 2.2 mmol/L   TYPE AND SCREEN   Result Value Ref Range    UNITS ORDERED NOT STATED         ABO/RH(D) A POSITIVE     ANTIBODY SCREEN NEGATIVE     SPECIMEN EXPIRATION DATE 04/11/2021,2359        Abnormal Lab results:  Labs Reviewed   COMPREHENSIVE METABOLIC PANEL, NON-FASTING - Abnormal; Notable for the following components:       Result Value    SODIUM 134 (*)     CO2 TOTAL 21 (*)     All other components within normal limits   LIPASE - Abnormal; Notable for the following components:    LIPASE 9 (*)     All other components within normal limits   URINALYSIS, MACROSCOPIC - Abnormal; Notable for the following components:    KETONES 2+ (*)     All other components within normal limits   CBC WITH DIFF - Abnormal; Notable for the following components:    NEUTROPHIL # 7.92 (*)     LYMPHOCYTE # 0.67 (*)     All other components within normal limits   TROPONIN-I - Abnormal; Notable for the following components:    TROPONIN I <7 (*)     All other components within normal limits   PT/INR - Abnormal; Notable for the following components:    PROTHROMBIN TIME 14.4 (*)     All other components within normal limits   PTT (PARTIAL THROMBOPLASTIN TIME) - Normal   COVID-19, FLU A/B, RSV RAPID BY PCR - Normal    Narrative:     Results are for the simultaneous qualitative identification of SARS-CoV-2 (formerly 2019-nCoV), Influenza A, Influenza B, and RSV RNA. These etiologic agents are generally detectable in nasopharyngeal and nasal swabs during the ACUTE PHASE  of infection. Hence, this test is intended to be performed on respiratory specimens collected from individuals with signs and symptoms of upper respiratory tract infection who meet Centers for Disease Control and Prevention (CDC) clinical and/or epidemiological criteria for Coronavirus Disease 2019 (COVID-19) testing. CDC COVID-19 criteria for testing on human specimens is available at [facility] webpage information for Healthcare Professionals: Coronavirus  Disease 2019 (COVID-19) ([email or web address]).                                     False-negative results may occur if the virus has genomic mutations, insertions, deletions, or rearrangements or if performed very early in the course of illness. Otherwise, negative results indicate virus specific RNA targets are not detected, however negative results do not preclude SARS-CoV-2 infection/COVID-19, Influenza, or Respiratory syncytial virus infection. Results should not be used as the sole basis for patient management decisions. Negative results must be combined with clinical observations, patient history, and epidemiological information. If upper respiratory tract infection is still suspected based on exposure history together with other clinical findings, re-testing should be considered.                                    Disclaimer:                   This assay has been authorized by FDA under an Emergency Use Authorization for use in laboratories certified under the Clinical Laboratory Improvement Amendments of 1988 (CLIA), 42 U.S.C. [phone], to perform high complexity tests. The impacts of vaccines, antiviral therapeutics, antibiotics, chemotherapeutic or immunosuppressant drugs have not been evaluated.                                     Test methodology:                   Cepheid Xpert Xpress SARS-CoV-2/Flu/RSV Assay real-time polymerase chain reaction (RT-PCR) test on the GeneXpert Dx and Xpert Xpress systems.   LACTIC ACID  LEVEL W/ REFLEX FOR LEVEL >2.0 - Normal   ADULT ROUTINE BLOOD CULTURE, SET OF 2 BOTTLES (BACTERIA AND YEAST)   ADULT ROUTINE BLOOD CULTURE, SET OF 2 BOTTLES (BACTERIA AND YEAST)   CBC/DIFF    Narrative:     The following orders were created for panel order CBC/DIFF.                  Procedure                               Abnormality         Status                                     ---------                               -----------         ------                                     CBC WITH [id number]                Abnormal            Final result  Please view results for these tests on the individual orders.   PERFORM POC ISTAT CREATININE POINT OF CARE   TYPE AND SCREEN       Imaging:   Results for orders placed or performed during the hospital encounter of 04/08/21 (from the past 72 hour(s))   CT ABDOMEN PELVIS W IV CONTRAST     Status: None    Narrative    History: Diffuse abdominal pain.    CT imaging of the abdomen and pelvis is performed following IV contrast administration. This CT scanner is equipped with dose reducing technology. The mAS is automatically adjusted according to patient size for optimal dose reduction. CT DLP 774.    Review of the lung bases shows minimal emphysematous changes with slight atelectasis. No acute infiltrates are seen.     Within the abdomen, no acute abnormalities are seen involving the liver, spleen, pancreas, kidneys, and adrenal glands. The gallbladder appears unremarkable.    Review of the bowel shows no evidence for obstruction. There is diffuse bowel wall thickening involving the mid to distal lead transverse colon as well as almost the entire descending colon. There is pericolonic inflammation and stranding with mild thickening along the paracolic gutter. The findings are consistent with a diffuse colitis. There is no perforation. There is a very tiny periumbilical ventral hernia. There are a few scattered colonic  diverticula.    Within the pelvis, the bladder appears mildly distended but otherwise unremarkable.. No free fluid is seen. Degenerative and postoperative changes are seen within the mid and lower lumbar spine. Prominent accessory ossicle is suspected at the right acetabulum .      Impression    1. Diffuse bowel wall thickening involving the distal transverse and entire descending colon with pericolonic inflammation and stranding consistent with mild colitis. There is no obstruction or perforation.        Radiologist location ID: [id number]         ECG:    Most Recent EKG This Encounter   ECG 12 LEAD    Collection Time: 04/08/21  9:04 AM   Result Value    Ventricular rate 89    Atrial Rate 89    PR Interval 158    QRS Duration 86    QT Interval 368    QTC Calculation 447    Calculated P Axis 80    Calculated R Axis 55    Calculated T Axis 28    Narrative    Normal sinus rhythm  Normal ECG     REVIEWED BY DR. NOAH CHILDERS  Confirmed by Hoover Browns [id number]), editor [place] Grant, Remee Charley [phone]) on 04/08/2021 10:46:10 AM         Assessment & Plan   Plan: Appropriate labs and imaging ordered. Medical Records reviewed.    MDM:    During the patient's stay in the emergency department, the above listed imaging and/or labs were performed to assist with medical decision making and were reviewed by myself when available for review.    Pt remained stable throughout the emergency department course.       Consults:    1330 - Spoke to Dr. Abran Richard, hospitalist, about the above H&P, along with the results. She would like the patient admitted to her service to be further evaluated and treated.    Disposition   Impression:   Diagnoses       Diagnosis Comment Added By Time Added    Colitis  Conley Rolls, DO 04/08/2021  1:10 PM  GI bleeding  Conley Rolls, DO 04/08/2021  1:12 PM          Disposition:  Admitted     Patient will be admitted to Dr. Kateri Plummer service for further evaluation and management.      I am scribing for, and in  the presence of Dr. Hoover Browns for services provided on 04/08/2021.  [address] [place]  04/08/2021, 09:52    I personally performed the services described in this documentation, as scribed  in my presence, and it is both accurate  and complete.    Conley Rolls, DO  Conley Rolls, DO  04/12/2021, 08:51

## 2021-04-08 NOTE — Nurses Notes (Signed)
Pt arrived to floor. Oriented to room and call light. Jimmy Footman NP here to see. Pt denies needs at this time. All safety measures maintained. Laurel Dimmer, RN  04/08/2021, 15:33

## 2021-04-08 NOTE — Consults (Signed)
Gastroenterology Consultation     Sean Wall, 65 y.o., male    Date of service: 04/08/21  Date of Birth:  15-Jun-1956    Referring Physician: [name], MD    Chief Complaint:  Abdominal pain, rectal bleeding    History of Present Illness:    Sean Wall is a 65 y.o.male patient with significant PMH of HTN, COPD, BPH, diverticulosis, IBS consulted to GI service regarding abdominal pain and rectal bleeding.     States that he had tooth pain and was placed on opioid pain medication for this. Went 3-4 days without a bowel movement. States he had intense abdominal pain that was worse in the lower abdomen. Patient attempted to digitally disimpact with small amount of stool removed. States stool was hard. There was no rectal bleeding at that time. Then started having bright red rectal bleeding. No stool produced with bleeding. Reports diarrhea, but actually has not had a bowel movement and is referring to rectal bleeding as diarrhea. Has history of colon polyps. Last colonoscopy around 2020 at the [facility] and states that this was normal. Believes there was an "issue with the oxygen in my blood". Hgb noted in chart on 08/06/2019 that was normal. Denies NSAID use or anticoagulation. Hgb in the ED 16.3. CT of abdomen/pelvis noted diffuse bowel wall thickening involving the distal transverse and entire descending colon with pericolonic inflammation and stranding consistent with mild colitis. No leukocytosis noted. Patient received zosyn in the ED.    Review of Systems:  ROS otherwise negative except for pertinent positives discussed in HPI above.    Past Medical History:   Diagnosis Date   . Acid reflux    . Enlarged prostate    . Erectile dysfunction    . Headache    . HTN (hypertension)      Past Surgical History:   Procedure Laterality Date   . HX APPENDECTOMY     . HX CARPAL TUNNEL RELEASE Bilateral    . HX HERNIA REPAIR     . HX LUMBAR FUSION     . HX SHOULDER SURGERY Bilateral          Family Medical History:    None          Social History     Socioeconomic History   . Marital status: Married   Tobacco Use   . Smoking status: Light Tobacco Smoker     Types: Cigars   . Smokeless tobacco: Never Used   Substance and Sexual Activity   . Alcohol use: Yes     Alcohol/week: 3.0 standard drinks     Types: 3 Cans of beer per week     Expanded Substance History     Additional history     Allergies   Allergen Reactions   . Propofol Rash     Outpatient Medications Marked as Taking for the 04/08/21 encounter [facility] Encounter)   Medication Sig   . atorvastatin (LIPITOR) 40 mg Oral Tablet Take 40 mg by mouth Every evening   . azelastine-fluticasone (DYMISTA) 137-50 mcg/spray Nasal Spray, Non-Aerosol Administer into affected nostril(s)   . buPROPion (WELLBUTRIN SR) 150 mg Oral tablet sustained-release 12 hr Take 300 mg by mouth Once a day   . cetirizine (ZYRTEC) 10 mg Oral Tablet Take 10 mg by mouth Once a day   . cyanocobalamin (VITAMIN B12) 1,000 mcg/mL Injection Solution 1,000 mcg Once a day   . cyclobenzaprine (FLEXERIL) 10 mg Oral Tablet Take 10 mg by mouth  Three times a day   . dextroamphetamine-amphetamine (ADDERALL) 10 mg Oral Tablet Take 10 mg by mouth Once per day as needed   . ferrous sulfate (FERATAB) 324 mg (65 mg iron) Oral Tablet, Delayed Release (E.C.) Take 324 mg by mouth Every other day   . finasteride (PROSCAR) 5 mg Oral Tablet Take 5 mg by mouth Once a day   . gabapentin (NEURONTIN) 300 mg Oral Capsule Take 300 mg by mouth Twice daily   . HYDROcodone-acetaminophen (NORCO) 5-325 mg Oral Tablet Take 1 Tablet by mouth Every 8 hours as needed for Pain   . Ibuprofen (MOTRIN) 600 mg Oral Tablet Take 600 mg by mouth Four times a day as needed for Pain   . melatonin 3 mg Oral Capsule Take 6 mg by mouth   . metoprolol tartrate (LOPRESSOR) 25 mg Oral Tablet Take 25 mg by mouth Once a day   . pantoprazole (PROTONIX) 20 mg Oral Tablet, Delayed Release (E.C.) Take 20 mg by mouth Every morning before breakfast   . QUEtiapine (SEROQUEL) 25  mg Oral Tablet Take 25 mg by mouth Once per day as needed   . simethicone (MYLICON) 80 mg Oral Tablet, Chewable Chew 80 mg Every 6 hours as needed   . sumatriptan succinate (IMITREX) 100 mg Oral Tablet Take 100 mg by mouth Once, as needed for Migraine May repeat in 2 hours in needed   . Tadalafil (CIALIS) 10 mg Oral Tablet Take 10 mg by mouth Every 24 hours as needed   . Tadalafil (CIALIS) 10 mg Oral Tablet Take 10 mg by mouth Every 24 hours as needed   . tamsulosin (FLOMAX) 0.4 mg Oral Capsule Take 0.4 mg by mouth Every evening after dinner   . Testosterone 1 % (25 mg/2.5gram) Transdermal Gel in Packet Place on the skin     atorvastatin (LIPITOR) tablet, 40 mg, Oral, QPM  [START ON 04/09/2021] buPROPion [facility] SR) sustained release tablet, 300 mg, Oral, Daily  ciprofloxacin (CIPRO) 434m IVPB in D5W 2026mpremix, 400 mg, Intravenous, Q12H  cyclobenzaprine (FLEXERIL) tablet, 10 mg, Oral, 3x/day  finasteride (PROSCAR) tablet, 5 mg, Oral, Daily  gabapentin (NEURONTIN) capsule, 300 mg, Oral, 2x/day  HYDROcodone-acetaminophen (NORCO) 5-325 mg per tablet, 1 Tablet, Oral, Q8H PRN  [START ON 04/09/2021] loratadine (CLARITIN) tablet, 10 mg, Oral, Daily  magnesium hydroxide (MILK OF MAGNESIA) 40045mer 5mL71mal liquid, 15 mL, Oral, Daily PRN  melatonin tablet, 3 mg, Oral, NIGHTLY  [START ON 04/09/2021] metoprolol tartrate (LOPRESSOR) tablet, 25 mg, Oral, Daily  metroNIDAZOLE (FLAGYL) 500 mg in NS 100 mL premix IVPB, 500 mg, Intravenous, Q12H  NS flush syringe, 3 mL, Intracatheter, Q8HRS  NS flush syringe, 3 mL, Intracatheter, Q1H PRN  NS flush syringe, 3 mL, Intracatheter, Q8HRS  NS flush syringe, 3 mL, Intracatheter, Q1H PRN  NS premix infusion, , Intravenous, Continuous  ondansetron (ZOFRAN) 2 mg/mL injection, 4 mg, Intravenous, Q6H PRN  pantoprazole (PROTONIX) delayed release tablet, 40 mg, Oral, 2x/day AC  polyethylene glycol (MIRALAX) oral packet, 17 g, Oral, Daily  simethicone (MYLICON) chewable tablet, 80 mg, Oral,  Q6H PRN  SUMAtriptan (IMITREX) tablet, 100 mg, Oral, Once PRN  tamsulosin (FLOMAX) capsule, 0.4 mg, Oral, Daily after Dinner        Physical Exam:  BP 134/82   Pulse 77   Temp 36.9 C (98.5 F)   Resp 16   Ht 1.778 m ('5\' 10"'$ )   Wt 90.7 kg (200 lb)   SpO2 99%   BMI 28.70 kg/m  General Appearance: The patient is lying in bed in no acute distress.    Heart: S1-S2 normal, no murmurs  Lungs: clear to auscultation bilaterally, no crackles  Abdomen: soft, diffuse tender to palpation, no rebound tenderness or guarding, bowel sounds present  Neurological: alert oriented x3, no focal deficit  Extremities: no edema noted  Skin: no rashes  Joints: no deformities     Labs:  Admission on 04/08/2021   Component Date Value Ref Range Status   . SODIUM 04/08/2021 134 (A) 136 - 145 mmol/L Final   . POTASSIUM 04/08/2021 4.1  3.5 - 5.1 mmol/L Final   . CHLORIDE 04/08/2021 101  96 - 111 mmol/L Final   . CO2 TOTAL 04/08/2021 21 (A) 23 - 31 mmol/L Final   . ANION GAP 04/08/2021 12  4 - 13 mmol/L Final   . BUN 04/08/2021 20  8 - 25 mg/dL Final   . CREATININE 04/08/2021 0.98  0.75 - 1.35 mg/dL Final   . BUN/CREA RATIO 04/08/2021 20  6 - 22 Final   . ESTIMATED GFR 04/08/2021 85  >=60 mL/min/BSA Final    Estimated Glomerular Filtration Rate (eGFR) is calculated using the CKD-EPI (2021) equation, intended for patients 66 years of age and older. If gender is not documented or "unknown", there will be no eGFR calculation.  Stage, GFR, Classification   G1, 90, Normal or High   G2, 60-89, Mildly decreased   G3a, 45-59, Mildly to moderately decreased   G3b, 30-44, Moderately to severely decreased   G4, 15-29, Severely decreased   G5, <15, Kidney failure   In the absence of kidney damage, neither G1 or G2 fulfill criteria for CKD per KDIGO.     Marland Kitchen ALBUMIN 04/08/2021 3.8  3.4 - 4.8 g/dL  Final   . CALCIUM 04/08/2021 9.4  8.8 - 10.2 mg/dL Final   . GLUCOSE 04/08/2021 104  65 - 125 mg/dL Final   . ALKALINE PHOSPHATASE 04/08/2021 63  45 -  115 U/L Final   . ALT (SGPT) 04/08/2021 19  10 - 55 U/L Final   . AST (SGOT)  04/08/2021 19  8 - 45 U/L Final   . BILIRUBIN TOTAL 04/08/2021 0.8  0.3 - 1.3 mg/dL Final    Naproxen therapy can falsely elevate total bilirubin levels.   Marland Kitchen PROTEIN TOTAL 04/08/2021 7.1  6.0 - 8.0 g/dL Final   . LIPASE 04/08/2021 9 (A) 10 - 60 U/L Final   . COLOR 04/08/2021 Yellow  Colorless, Straw, Yellow Final   . APPEARANCE 04/08/2021 Clear  Clear Final   . SPECIFIC GRAVITY 04/08/2021 1.010  >1.005 - <1.030 Final   . PH 04/08/2021 7.5  >5.0 - <8.0 Final   . PROTEIN 04/08/2021 Not Detected  Not Detected mg/dL Final   . GLUCOSE 04/08/2021 Not Detected  Not Detected mg/dL Final   . KETONES 04/08/2021 2+ (A) Not Detected mg/dL Final   . UROBILINOGEN 04/08/2021 0.2   Not Detected, 0.2  mg/dL Final   . BILIRUBIN 04/08/2021 Not Detected  Not Detected mg/dL Final   . BLOOD 04/08/2021 Not Detected  Not Detected mg/dL Final   . NITRITE 04/08/2021 Not Detected  Not Detected Final   . LEUKOCYTES 04/08/2021 Not Detected  Not Detected WBCs/uL Final   . WBC 04/08/2021 9.6  3.7 - 11.0 x10^3/uL Final   . RBC 04/08/2021 5.25  4.50 - 6.10 x10^6/uL Final   . HGB 04/08/2021 16.3  13.4 - 17.5 g/dL Final   . HCT 04/08/2021 48.0  38.9 - 52.0 % Final   . MCV 04/08/2021 91.4  78.0 - 100.0 fL Final   . MCH 04/08/2021 31.0  26.0 - 32.0 pg Final   . MCHC 04/08/2021 34.0  31.0 - 35.5 g/dL Final   . RDW-CV 04/08/2021 13.0  11.5 - 15.5 % Final   . PLATELETS 04/08/2021 259  150 - 400 x10^3/uL Final   . MPV 04/08/2021 9.6  8.7 - 12.5 fL Final   . NEUTROPHIL % 04/08/2021 83  % Final   . LYMPHOCYTE % 04/08/2021 7  % Final   . MONOCYTE % 04/08/2021 10  % Final   . EOSINOPHIL % 04/08/2021 0  % Final   . BASOPHIL % 04/08/2021 0  % Final   . NEUTROPHIL # 04/08/2021 7.92 (A) 1.50 - 7.70 x10^3/uL Final   . LYMPHOCYTE # 04/08/2021 0.67 (A) 1.00 - 4.80 x10^3/uL Final   . MONOCYTE # 04/08/2021 0.93  0.20 - 1.10 x10^3/uL Final   . EOSINOPHIL # 04/08/2021 <0.10  <=0.50 x10^3/uL  Final   . BASOPHIL # 04/08/2021 <0.10  <=0.20 x10^3/uL Final   . IMMATURE GRANULOCYTE % 04/08/2021 0  0 - 1 % Final    The immature granulocyte fraction (IGF) quantifies total circulating myelocytes, metamyelocytes, and promyelocytes. It is used to evaluate immune responses to infection, inflammation, or other stimuli of the bone marrow. Caution is advised in interpreting test results in neonates who normally have greater numbers of circulating immature blood cells.     . IMMATURE GRANULOCYTE # 04/08/2021 <0.10  <0.10 x10^3/uL Final   . UNITS ORDERED 04/08/2021 NOT STATED       Final   . ABO/RH(D) 04/08/2021 A POSITIVE   Final   . ANTIBODY SCREEN 04/08/2021 NEGATIVE   Final   . SPECIMEN EXPIRATION DATE 04/08/2021 04/11/2021,2359   Final   . Ventricular rate 04/08/2021 89  BPM Final   . Atrial Rate 04/08/2021 89  BPM Final   . PR Interval 04/08/2021 158  ms Final   . QRS Duration 04/08/2021 86  ms Final   . QT Interval 04/08/2021 368  ms Final   . QTC Calculation 04/08/2021 447  ms Final   . Calculated P Axis 04/08/2021 80  degrees Final   . Calculated R Axis 04/08/2021 55  degrees Final   . Calculated T Axis 04/08/2021 28  degrees Final   . TROPONIN I 04/08/2021 <7 (A) 7 - 30 ng/L Final    99th percentile reference cutoff for cardiac Troponin I using Abbott immunoassay, <30 ng/L  Myocardial ischemia, injury, and/or infarction should be differentiated using clinical factors and a rise and/or fall in cardiac troponin results, according to international consensus/guidance documents.   Marland Kitchen PROTHROMBIN TIME 04/08/2021 14.4 (A) 9.7 - 13.6 seconds Final   . INR 04/08/2021 1.22  <=5.00 Final    Low ISI PT Reagent effective 09/30/96    INR Critical = > 5.0    Recommended therapeutic range for oral anticoagulants     Prophylaxis / Treatment of:                   INR  _______________________________________   __________      Venous Thrombosis, Pulmonary Embolism    2.0 - 3.0      Prevention of Systemic Embolism from:          INR  _______________________________________   ___________      Atrial Fibrillation  2.0 - 3.0    Myocardial Infarction                    2.0 - 3.0    Mechanical Prosthetic Heart Valve        2.5 - 3.5    Recurrent Systemic Embolism              2.5 - 3.5         . APTT 04/08/2021 29.1  26.0 - 39.0 seconds Final    Critical PTT = > 120 seconds  PTT Therapeutic range for heparinized patients - refer to [facility] Heparin adjustment nomogram.   . SARS-CoV-2 04/08/2021 Not Detected  Not Detected Final   . INFLUENZA VIRUS TYPE A 04/08/2021 Not Detected  Not Detected Final   . INFLUENZA VIRUS TYPE B 04/08/2021 Not Detected  Not Detected Final   . RESPIRATORY SYNCTIAL VIRUS (RSV) 04/08/2021 Not Detected  Not Detected Final   . LACTIC ACID 04/08/2021 1.2  0.5 - 2.2 mmol/L Final       Imaging Studies:    CT ABDOMEN PELVIS W IV CONTRAST    Result Date: 04/08/2021  History: Diffuse abdominal pain. CT imaging of the abdomen and pelvis is performed following IV contrast administration. This CT scanner is equipped with dose reducing technology. The mAS is automatically adjusted according to patient size for optimal dose reduction. CT DLP 774. Review of the lung bases shows minimal emphysematous changes with slight atelectasis. No acute infiltrates are seen. Within the abdomen, no acute abnormalities are seen involving the liver, spleen, pancreas, kidneys, and adrenal glands. The gallbladder appears unremarkable. Review of the bowel shows no evidence for obstruction. There is diffuse bowel wall thickening involving the mid to distal lead transverse colon as well as almost the entire descending colon. There is pericolonic inflammation and stranding with mild thickening along the paracolic gutter. The findings are consistent with a diffuse colitis. There is no perforation. There is a very tiny periumbilical ventral hernia. There are a few scattered colonic diverticula. Within the pelvis, the bladder appears mildly  distended but otherwise unremarkable.. No free fluid is seen. Degenerative and postoperative changes are seen within the mid and lower lumbar spine. Prominent accessory ossicle is suspected at the right acetabulum .     1. Diffuse bowel wall thickening involving the distal transverse and entire descending colon with pericolonic inflammation and stranding consistent with mild colitis. There is no obstruction or perforation. Radiologist location ID: [id number]     ECG 12 LEAD    Result Date: 04/08/2021  Normal sinus rhythm Normal ECG  REVIEWED BY DR. NOAH CHILDERS Confirmed by Hoover Browns [phone]), editor VANNEST, MEGAN [phone]) on 04/08/2021 10:46:10 AM      Assessment :    1M with acute onset of lower abdominal pain and rectal bleeding. GI consulted for the same. Having constipation over the last few days, secondary to opioid medications. Started having lower abdominal pain. Unsuccessful digital disimpaction. Multiple episodes of rectal bleeding. Blood was darker in color but bright red on the edges. Has decreased since onset. Still having diffuse abdominal pain. CT noted thickening of colon in transverse and descending. Last colonoscopy in 2020.    Plan:    1) Abnormal CT abdomen/rectal bleeding   Trend H/H and transfuse as needed   2 large bore IVs   Continue antibiotics   Check CRP and ESR   Colonoscopy in AM   Golytely prep   Keep NPO - ok for  water/ice chips      The patient was explained the potential risks of the endoscopic procedure(s) including but not limited to bleeding, perforation, reaction to medications and agreed undergoing these tests.    Sheila Oats, FNP-C    Patient was seen and examined, plan of care discussed with Dr. Shawnie Pons    Agree with assessment and recommendations above.  Brooks Sailors, MD  Gastroenterology

## 2021-04-08 NOTE — ED Nurses Note (Signed)
Hospitalist at bedside 

## 2021-04-08 NOTE — H&P (Addendum)
History and Physical Exam     Sean Wall 65 y.o. male  Date of Birth: [birth year]  Date of Admit:  04/08/2021   Date of Service: 04/08/2021    Attending: Conley Rolls, DO Code Status:No Order   PCP: [name], MD      Chief Complaint:  Abdominal Pain, Diarrhea, and Rectal Bleeding       History of Presenting Illness: Sean Wall is a 65 y.o., White male with past medical history significant for hypertension, GERD, BPH diverticulosis, IBS. Patient presented to [facility] ED for evaluation of abdominal pain.  Patient reports he was constipated for 1 week and yesterday tried to disimpact himself and was unsuccessful.  He reports experiencing diffuse abdominal discomfort, rated 10/10, described as a cramping sensation which progressively got worse and became bothersome today.  He states his pain is so severe that he felt better in fetal position.  He did not have a bowel movement with disimpaction yesterday but had frank blood in the toilet bowel. He has had about 5-6 episodes of frank blood overnight without bowel movement. He had small to medium bowel movement in the ED with frank blood. He reports that he has dental cap came off and felt his route was exposed resulting in pain, so he took hydrocodone that he usually takes for shoulder pain.  He scheduled a dental appointment on Sunday 4:00 p.m..  He denies taking any NSAIDs or oral anticoagulants. Patient also received 2nd dose of COVID booster on last Thursday. Patient states that he he moved from [place] to be closer to family and has had colonoscopy 3 years ago in [place] which was reportedly normal. He was constipated yesterday afternoon and manually disimpacted himself, since then he is having occasional diarrhea. Denied fever/chills, chest pain, palpitations, shortness of breath, dizziness, dysuria, hematuria.    ED Course & Medications:   ER workup significant for sodium 134, bicarb 21.  Lipase level negative.  Troponin negative x1.  Lactic  acid 1.2.  Urinalysis negative.  Blood cultures obtained.  COVID-19, flu panel, RSV negative.  CT abdomen pelvis showed diffuse bowel wall thickening involving the distal transverse and descending colon with pericolonic inflammation and stranding consistent with mild colitis with no obstruction or perforation.  EKG showed normal sinus rhythm with ventricular rate of 89 and no dynamic ST changes per my view. He received Zosyn, morphine 4 mg x 2, normal saline 1 L bolus, Zofran 4 mg. He is being admitted to the hospitalist service for further workup and management.  Medications Administered in the ED   NS flush syringe (10 mL Intracatheter Given 04/08/21 0924)   NS flush syringe (has no administration in time range)   NS bolus infusion 1,000 mL (0 mL Intravenous Stopped 04/08/21 1125)   morphine 4 mg/mL injection (4 mg Intravenous Given 04/08/21 1057)   ondansetron (ZOFRAN) 2 mg/mL injection (4 mg Intravenous Given 04/08/21 1058)   iopamidol (ISOVUE-370) 76% infusion (90 mL Intravenous Given 04/08/21 1157)   eugenol 85% dental liquid ( Apply Topically Given 04/08/21 1246)   piperacillin-tazobactam (ZOSYN) 3.375 g in iso-osmotic 50 mL premix IVPB (3.375 g Intravenous New Bag/New Syringe 04/08/21 1246)   morphine 4 mg/mL injection (4 mg Intravenous Given 04/08/21 1246)       Review of Systems:   A 10 point organ system review was conducted with the patient and all was negative except for what was mentioned in the HPI.      OBJECTIVE  Medical History     PMHx:    Past Medical History:   Diagnosis Date   . Acid reflux    . Enlarged prostate    . Erectile dysfunction    . Headache    . HTN (hypertension)       Allergies:    Allergies   Allergen Reactions   . Propofol Rash     Social History  Social History     Tobacco Use   . Smoking status: Light Tobacco Smoker     Types: Cigars   . Smokeless tobacco: Never Used   Substance Use Topics   . Alcohol use: Yes     Alcohol/week: 3.0 standard drinks     Types: 3 Cans of beer per week      Family History  Family Medical History:    None        Home Meds:   HYDROcodone-acetaminophen, Ibuprofen, QUEtiapine, Tadalafil, Testosterone, atorvastatin, azelastine-fluticasone, buPROPion, cetirizine, cyanocobalamin, cyclobenzaprine, dextroamphetamine-amphetamine, ferrous sulfate, finasteride, gabapentin, melatonin, metoprolol tartrate, pantoprazole, simethicone, sumatriptan succinate, and tamsulosin         Objective Findings     Physical Exam:  PHYSICAL EXAM:  General: No apparent acute distress. Very pleasant.  Obese  Eyes: Conjunctiva are clear. Sclera anicteric.  HENT: Mucous membranes are moist.   Neck: Supple. No JVD.   Lungs: Clear to auscultation bilaterally.   Cardiovascular: Normal rate and regular rhythm.   Abdomen: Soft. Non-tender. No rebound, guarding, or peritoneal signs.  Left upper quadrant, epigastric tenderness.  Extremities: No significant peripheral edema.  Skin: Warm and dry.  Neurologic: Grossly normal throughout.  Psychiatric: Alert and oriented x 3. Affect within normal limits.      Summary of Lab Work and Diagnostic Studies:     CBC   Recent Labs     04/08/21  0906   WBC 9.6   HGB 16.3   HCT 48.0   PLTCNT 259      Chemistries   Recent Labs     04/08/21  0906   SODIUM 134*   POTASSIUM 4.1   CHLORIDE 101   CO2 21*   BUN 20   CREATININE 0.98   CALCIUM 9.4   ALBUMIN 3.8      Liver Enzymes   Recent Labs     04/08/21  0906   TOTALPROTEIN 7.1   ALBUMIN 3.8   AST 19   ALT 19   ALKPHOS 63   LIPASE 9*      Inflammatory Markers   Recent Labs     04/08/21  1052   LACTICACID 1.2       Cardiac and Coags   Recent Labs     04/08/21  0906   INR 1.22      Lipid Panel   No results for input(s): HDL in the last 72 hours.    Invalid input(s): LDL, CHOL     Results for orders placed or performed during the hospital encounter of 04/08/21 (from the past 24 hour(s))   CT ABDOMEN PELVIS W IV CONTRAST     Status: None    Narrative    History: Diffuse abdominal pain.    CT imaging of the abdomen and pelvis is  performed following IV contrast administration. This CT scanner is equipped with dose reducing technology. The mAS is automatically adjusted according to patient size for optimal dose reduction. CT DLP 774.    Review of the lung bases shows minimal emphysematous changes with slight atelectasis. No  acute infiltrates are seen.     Within the abdomen, no acute abnormalities are seen involving the liver, spleen, pancreas, kidneys, and adrenal glands. The gallbladder appears unremarkable.    Review of the bowel shows no evidence for obstruction. There is diffuse bowel wall thickening involving the mid to distal lead transverse colon as well as almost the entire descending colon. There is pericolonic inflammation and stranding with mild thickening along the paracolic gutter. The findings are consistent with a diffuse colitis. There is no perforation. There is a very tiny periumbilical ventral hernia. There are a few scattered colonic diverticula.    Within the pelvis, the bladder appears mildly distended but otherwise unremarkable.. No free fluid is seen. Degenerative and postoperative changes are seen within the mid and lower lumbar spine. Prominent accessory ossicle is suspected at the right acetabulum .      Impression    1. Diffuse bowel wall thickening involving the distal transverse and entire descending colon with pericolonic inflammation and stranding consistent with mild colitis. There is no obstruction or perforation.        Radiologist location ID: [email or web address]     Acute Colitis with hematochezia  Patient admitted to hospital after experiencing few episodes of hematochezia with no history of hemorrhoids or GI bleed  CT abdomen pelvis showed findings consistent with colitis no obstruction or perforation  He received normal saline 1 L bolus  Empirically cover with ciprofloxacin and Flagyl fr colitis and provide supportive care with IV fluids, pain and nausea medication  Last  colonoscopy 3 years ago which was reportedly normal.  Check stool studies & H pylori  Consult GI Service for recommendations  Hemoglobin 16.3 ( patient is on AndroGel undergoes therapeutic phlebotomy, however none recently in the last 3 years). Repeat H&H tonight.  Start him on clear liquid diet  Will order PPI b.i.d.    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    Migraine headaches  Continue Imitrex PRN    BPH  Continue Flomax, Protonix    Depression  mood disorder  Continue home Wellbutrin, Seroquel                Nutrition: DIET CLEAR LIQUID   DVT PPx: SCDs  Code Status: Full code    Evangeline Dakin, APRN  Patient seen and evaluated independently of Dr. Abran Richard and she agrees with the above assessment and plan.       This note may have been partially generated using MModal Fluency Direct system, and there may be some incorrect words, spellings, and punctuation that were not noted in checking the note before saving.     End of Note     .I personally saw and evaluated the patient as part of a shared service with an APP.    My substantive findings are:  Constitutional well-nourished male sitting up in bed on room air  HEENT normocephalic atraumatic extraocular muscle intact  Cardiovascular regular rate rhythm normal S1-S2  Respiratory clear to auscultation bilaterally no wheezes rhonchi  GI/GU soft nondistended no rebound guarding or peritoneal signs tenderness to palpation throughout with light touch palpation normal bowel sounds  Musculoskeletal no clubbing cyanosis or edema  Neuro alert orient x3 no focal deficits  Psych cooperative appropriate mood affect    Continue clear liquid diet.  Cipro and Flagyl.  GI consult on plan for colonoscopy in the morning.  Bowel prep per GI.  Stool studies.  Monitor H&H.    I independently of the APP spent a total of (35) minutes in  direct/indirect care of this patient including initial evaluation, review of laboratory, radiology, diagnostic studies, review of medical record, order entry and coordination of care.   .Patient was seen and examined as part of a shared visit with the APP.  I personally spent more than 50% of the encounter with direct, face-to-face patient care including: obtaining history, performing physical exam, counseling the patient and family, and directing medical decision making.

## 2021-04-08 NOTE — ED Nurses Note (Signed)
EDMD notified of pt pain/nausea

## 2021-04-09 ENCOUNTER — Encounter (HOSPITAL_COMMUNITY)
Admission: EM | Disposition: A | Discharge status: Home / Self Care | Payer: Self-pay | Source: Home / Self Care | Attending: NURSE PRACTITIONER

## 2021-04-09 ENCOUNTER — Observation Stay (HOSPITAL_COMMUNITY): Payer: 59 | Admitting: Anesthesiology

## 2021-04-09 ENCOUNTER — Observation Stay (HOSPITAL_COMMUNITY): Payer: 59 | Admitting: Radiology

## 2021-04-09 ENCOUNTER — Other Ambulatory Visit: Payer: Self-pay | Admitting: Internal Medicine

## 2021-04-09 DIAGNOSIS — K639 Disease of intestine, unspecified: Secondary | ICD-10-CM

## 2021-04-09 DIAGNOSIS — K648 Other hemorrhoids: Secondary | ICD-10-CM

## 2021-04-09 DIAGNOSIS — F17200 Nicotine dependence, unspecified, uncomplicated: Secondary | ICD-10-CM | POA: Insufficient documentation

## 2021-04-09 HISTORY — PX: COLONOSCOPY: WVUENDOPRO10

## 2021-04-09 LAB — CBC WITH DIFF
BASOPHIL #: <0.1 10*3/uL (ref <=0.20)
BASOPHIL %: 0 %
EOSINOPHIL #: 0.16 10*3/uL (ref <=0.50)
EOSINOPHIL %: 2 %
HCT: 43 % (ref 38.9–52.0)
HGB: 14.2 g/dL (ref 13.4–17.5)
IMMATURE GRANULOCYTE #: <0.1 10*3/uL (ref <0.10)
IMMATURE GRANULOCYTE %: 0 % (ref 0–1)
LYMPHOCYTE #: 0.97 10*3/uL — ABNORMAL LOW (ref 1.00–4.80)
LYMPHOCYTE %: 10 %
MCH: 31.2 pg (ref 26.0–32.0)
MCHC: 33 g/dL (ref 31.0–35.5)
MCV: 94.5 fL (ref 78.0–100.0)
MONOCYTE #: 0.9 10*3/uL (ref 0.20–1.10)
MONOCYTE %: 10 %
MPV: 9.7 fL (ref 8.7–12.5)
NEUTROPHIL #: 7.4 10*3/uL (ref 1.50–7.70)
NEUTROPHIL %: 78 %
PLATELETS: 216 10*3/uL (ref 150–400)
RBC: 4.55 10*6/uL (ref 4.50–6.10)
RDW-CV: 13.3 % (ref 11.5–15.5)
WBC: 9.5 10*3/uL (ref 3.7–11.0)

## 2021-04-09 LAB — E. COLI SHIGA TOXIN
SHIGA TOXIN 1: NEGATIVE
SHIGA TOXIN 2: NEGATIVE

## 2021-04-09 LAB — BASIC METABOLIC PANEL
ANION GAP: 8 mmol/L (ref 4–13)
BUN/CREA RATIO: 14 (ref 6–22)
BUN: 12 mg/dL (ref 8–25)
CALCIUM: 8 mg/dL — ABNORMAL LOW (ref 8.8–10.2)
CHLORIDE: 106 mmol/L (ref 96–111)
CO2 TOTAL: 22 mmol/L — ABNORMAL LOW (ref 23–31)
CREATININE: 0.83 mg/dL (ref 0.75–1.35)
ESTIMATED GFR: >90 mL/min/BSA (ref >=60)
GLUCOSE: 90 mg/dL (ref 65–125)
POTASSIUM: 4.2 mmol/L (ref 3.5–5.1)
SODIUM: 136 mmol/L (ref 136–145)

## 2021-04-09 LAB — MAGNESIUM: MAGNESIUM: 2 mg/dL (ref 1.8–2.6)

## 2021-04-09 LAB — H & H
HCT: 43.1 % (ref 38.9–52.0)
HGB: 14.1 g/dL (ref 13.4–17.5)

## 2021-04-09 LAB — OVA AND PARASITE SCREEN: OVA & PARASITE SCREEN TRICHROME: NONE SEEN

## 2021-04-09 LAB — LACTIC ACID LEVEL W/ REFLEX FOR LEVEL >2.0: LACTIC ACID: 1.3 mmol/L (ref 0.5–2.2)

## 2021-04-09 SURGERY — COLONOSCOPY WITH BIOPSY
Anesthesia: General

## 2021-04-09 MED ORDER — ONDANSETRON HCL (PF) 4 MG/2 ML INJECTION SOLUTION
Freq: Once | INTRAMUSCULAR | Status: DC | PRN
Start: 2021-04-09 — End: 2021-04-09
  Administered 2021-04-09 (×2): 4 mg via INTRAVENOUS

## 2021-04-09 MED ORDER — MIDAZOLAM 1 MG/ML INJECTION SOLUTION
INTRAMUSCULAR | Status: AC
Start: 2021-04-09 — End: 2021-04-09
  Filled 2021-04-09: qty 2

## 2021-04-09 MED ORDER — LACTATED RINGERS INTRAVENOUS SOLUTION
INTRAVENOUS | Status: DC | PRN
Start: 2021-04-09 — End: 2021-04-09

## 2021-04-09 MED ORDER — ETOMIDATE 2 MG/ML INTRAVENOUS SOLUTION
Freq: Once | INTRAVENOUS | Status: DC | PRN
Start: 2021-04-09 — End: 2021-04-09
  Administered 2021-04-09: 40 mg via INTRAVENOUS

## 2021-04-09 MED ORDER — ETOMIDATE 2 MG/ML INTRAVENOUS SOLUTION
INTRAVENOUS | Status: AC
Start: 2021-04-09 — End: 2021-04-09
  Filled 2021-04-09: qty 20

## 2021-04-09 MED ORDER — MIDAZOLAM 1 MG/ML INJECTION SOLUTION
Freq: Once | INTRAMUSCULAR | Status: DC | PRN
Start: 2021-04-09 — End: 2021-04-09
  Administered 2021-04-09: 2 mg via INTRAVENOUS

## 2021-04-09 MED ORDER — FENTANYL (PF) 50 MCG/ML INJECTION SOLUTION
INTRAMUSCULAR | Status: AC
Start: 2021-04-09 — End: 2021-04-09
  Filled 2021-04-09: qty 2

## 2021-04-09 MED ORDER — FENTANYL (PF) 50 MCG/ML INJECTION SOLUTION
Freq: Once | INTRAMUSCULAR | Status: DC | PRN
Start: 2021-04-09 — End: 2021-04-09
  Administered 2021-04-09: 100 ug via INTRAVENOUS

## 2021-04-09 SURGICAL SUPPLY — 63 items
APPL ENDOS 40CM 360D SPRAY SET SNPLK GAS AST TIP DUPLOSPRAY MIS LF (ENDOSCOPIC SUPPLIES) IMPLANT
CAP ENDOSCP SEAL 9.8-11.1MM MED RF ABLATION FLXB BARRX GIF-180 STRL DISP (ENDOSCOPIC SUPPLIES) IMPLANT
CAPSULE PH BRAVO CALIBRATE FREE RFLX DEL SYS (MED SURG SUPPLIES) IMPLANT
CATH ABLATION BARRX 4MM 160CM 20X13MM 90D BIPOLAR ELECTRODE BAL FOCL RF ESPH ACPT 8.6-12.8MM SCP (ENDOSCOPIC SUPPLIES) IMPLANT
CATH ABLATION RF (VASCULAR) IMPLANT
CATH ABLT BARRX 360 18-31MM 4C_M 8CM SLF ADJ BAL RAD FRQ (VASCULAR)
CATH CRE 6-7-8MM 7.5FR 5.5CM 240CM 2.8MM 3.2MM LOW PROF GW BAL DIL ESOPH PYL BIL PEBAX STRL LF  DISP (ENDOSCOPIC SUPPLIES) IMPLANT
CATH ELHMST GLD PROBE 7FR 300CM BIPOLAR RND DIST TIP STD CONN FIRM SHAFT HMGLD STRL DISP 2.8MM MN (ENDOSCOPIC SUPPLIES) IMPLANT
CATH ELHMST HALO 20MM (INSTRUMENTS ENDOMECHANICAL)
CATH ELHMST HALO 260CM GW (INSTRUMENTS ENDOMECHANICAL)
CATH ENDOS BARRX 2.8MM 135CM 1_5.7X7.5MM CHNL RF ABLT ACT (MED SURG SUPPLIES)
CATH ENDOS BARRX 2.8MM 135CM FLXB CHNL RF ABLATION ESPH DISP (MED SURG SUPPLIES) IMPLANT
CATH PH MONITORING VERSAFLEX Z 6FR ZNIS+8R 8 RING DISP (MED SURG SUPPLIES) IMPLANT
CLIP HMST 2.8MM REPST DURACLIP 235CM 16MM CSCP (ENDOSCOPIC SUPPLIES) IMPLANT
CLIP HMST DURACLIP L16 MM_OD2.8 MM REPST CSCP (INSTRUMENTS ENDOMECHANICAL)
CLIP HMST MR CONDITIONAL BRD CATH ROT CONTROL KNOB NO SHEATH RSL 360 235CM 2.8MM 11MM OPN (ENDOSCOPIC SUPPLIES) IMPLANT
CLIP HMST RADOPQ PRELD STRL DISP RSL 235CM 2.8MM 11MM OPN (ENDOSCOPIC SUPPLIES) IMPLANT
CLIP LGT RSL 360 ULTRA 235CM B_RD ROT CONTROL KNOB 17MM OPN (ENDOSCOPIC SUPPLIES) IMPLANT
DEVICE HEMOSTASIS CLIP 360_235CM RESOLUTION (INSTRUMENTS ENDOMECHANICAL)
DEVICE RESOLUTION CLIP 235CML_M00522611 11MMW 10EA/BX (INSTRUMENTS ENDOMECHANICAL)
DILATOR ENDOS CRE 180CM 8CM 10-11-12MM 6FR ESOPH BAL LOW PROF FIX WRE PEBAX STRL LF  DISP 2.8MM (ENDOSCOPIC SUPPLIES) IMPLANT
DILATOR ENDOS CRE 240CM 5.5CM 11-13.5-15MM 7.5FR ESOPH PYL BIL BAL LOW PROF GW PEBAX STRL LF  DISP (ENDOSCOPIC SUPPLIES) IMPLANT
DILATOR ENDOS CRE 240CM 5.5CM 15-16.5-18MM 7.5FR ESOPH PYL BIL BAL LOW PROF GW PEBAX STRL LF  DISP (ENDOSCOPIC SUPPLIES) IMPLANT
DILATOR ENDOS CRE 240CM 5.5CM 18-19-20MM 7.5FR ESOPH PYL BIL BAL LOW PROF GW PEBAX STRL LF  DISP 2.8 (ENDOSCOPIC SUPPLIES) IMPLANT
DILATOR ENDOS CRE 240CM 5.5CM 8-9-10MM 7.5FR ESOPH PYL BIL BAL LOW PROF GW PEBAX STRL LF  DISP 2.8MM (ENDOSCOPIC SUPPLIES) IMPLANT
DILATOR ENDOS CRE 240CM 5.5CM_15-16.5-18MM 7.5FR ESOPH PYL (INSTRUMENTS ENDOMECHANICAL)
DILATOR ENDOS CRE 240CM 5.5CM_8-9-10MM 7.5FR ESOPH PYL BIL (INSTRUMENTS ENDOMECHANICAL)
DILATOR ESOPH BAL 6/7/8MM_M00558660 7.5FR 240CML (INSTRUMENTS ENDOMECHANICAL)
DISC NO SUB - FORCEPS BIOPSY MLT SAMPLE 240CM 2.4MM MTBT 2.8MM STRL DISP ORNG (ENDOSCOPIC SUPPLIES) IMPLANT
DISC RECALLED - KIT SYRG SYRG KIT TWN PK 23G O_RISE INTJCT 10ML DISP CLR GEL (ENDOSCOPIC SUPPLIES) IMPLANT
ELECTRODE ESURG 85SQ CM 4MR NESSY OMG NONST DISP RTN PLATE NEUTRALIZE CABLE PLUG LF (MED SURG SUPPLIES) IMPLANT
ELECTRODE ESURG 85SQ CM 4MR NE_SSY OMG NONST DISP ADLT RTN (MED SURG SUPPLIES)
FORCEPS BIOPSY 160CM 1.8MM RJ 4 PED 2+ MM DISP GASTROSCOPIC (ENDOSCOPIC SUPPLIES) IMPLANT
FORCEPS BIOPSY 160CM 1.8MM RJ_4 PED 2+ MM DISP GASTROSCOPIC (INSTRUMENTS ENDOMECHANICAL)
FORCEPS BIOPSY MLT SAMPLE 240C_M 2.4MM MTBT 2.8MM STRL DISP (INSTRUMENTS ENDOMECHANICAL)
FORCEPS ENDOS HOT PRCS BITE 24_0CM 2.8MM 2.2MM RJ 4 CUP DISP (ENDOSCOPIC SUPPLIES) IMPLANT
FORCEPS ENDOS HOT PRCS BITE 24_0CM 2.8MM 2.2MM RJ 4 CUP DISP (INSTRUMENTS ENDOMECHANICAL)
GW ENDOS .038IN 260CM BARRX RFA STR DIST TIP FLXB SS STRL DISP (ENDOSCOPIC SUPPLIES) IMPLANT
GW ENDOS 200CM SAVARY FLX TP_SS NONST (INSTRUMENTS ENDOMECHANICAL)
GW URO 200CM SAVARY FLX TP SS NONST (ENDOSCOPIC SUPPLIES) IMPLANT
KIT JEJUN 45CM 22FR SIL MIC SECURLOK LL-SLIP RADOPQ UNIV FEED PORT CONN INTERNAL RETENTION BAL TAPER (MED SURG SUPPLIES) IMPLANT
KIT PEG 20FR SIL PUL RADOPQ ME_D PORT MIC SECURLOK 12ML STRL IMPLANT
LIGATOR 2.8MM 8.6-11.5MM SSS7 ESOPH 1 STNG MLT BAND HNDL STRL DISP ENDOS HMSTS LF (ENDOSCOPIC SUPPLIES) IMPLANT
LIGATOR 2.8MM 8.6-11.5MM SSS7_ESOPH 1 STNG MLT BAND ERG HNDL (INSTRUMENTS ENDOMECHANICAL)
MARKER ENDOS SPOT EX PERM IND DRK SYRG 5ML (MED SURG SUPPLIES) IMPLANT
MARKER ENDOS SPOT EX PREFL PRE_ASSEMBLE SYRG PERM (MED SURG SUPPLIES)
NEEDLE SCLRTX 25GA 1.8MM .24MM_INNER CATH SHEATH STRL DISP (INSTRUMENTS ENDOMECHANICAL)
NEEDLE SCLRTX 25GA 1.8MM CLR 4MM 200CM (ENDOSCOPIC SUPPLIES) IMPLANT
NEEDLE SCLRTX 25GA 2.3MM BVL STRL DISP STAR CATH INTJCT 4MM 240CM (ENDOSCOPIC SUPPLIES) IMPLANT
NEEDLE SCLRTX 25GA 2.3MM BVL S_TRL DISP STAR CATH INTJCT 4MM (INSTRUMENTS ENDOMECHANICAL)
NEEDLE SCLRTX 25GA 2.3MM CSCP OPTC TIP STRL LF  DISP FLXTP 5MM 230CM STD (ENDOSCOPIC SUPPLIES) IMPLANT
NEEDLE SCLRTX 25GA 2.3MM STRL DISP FLXTP 5MM 160CM STD (ENDOSCOPIC SUPPLIES) IMPLANT
NET SPEC RETR 230CM 2.5MM RTHNT SHEATH 6X3CM PLYP NONST LF  DISP (ENDOSCOPIC SUPPLIES) IMPLANT
PROBE COAG 7.2FT 6.9FR FIAPC CRCMF PLUG PLAY FUNCTIONALITY FILTER (SURGICAL CUTTING SUPPLIES) IMPLANT
PROBE ESURG 300CM 2.3MM FIAPC FLXB CRCMF STRL DISP (SURGICAL CUTTING SUPPLIES) IMPLANT
PROBE ESURG 300CM 2.3MM FIAPC_FLXB STR FIRE ARGON PLAS COAG (CUTTING ELEMENTS)
SNARE RND 240CM 2.4MM CAPTIVATR COLD STF THN WRE ENDOS PLPCTM 10MM DISP (ENDOSCOPIC SUPPLIES) IMPLANT
SNARE SURG 10MM RND COLD (INSTRUMENTS ENDOMECHANICAL)
SYRINGE INFLAT ALN II GA STRL DISP 60ML (ENDOSCOPIC SUPPLIES) IMPLANT
SYRINGE INFLAT ALN II GA STRL_DISP 60ML (INSTRUMENTS ENDOMECHANICAL)
TIP APPL 40CM SS DUPLOSPRAY (MED SURG SUPPLIES) IMPLANT
USE ITEM 343174 SNARE RND 240CM 2.4MM CAPTIVATR COLD STF THN WRE ENDOS PLPCTM 10MM DISP (ENDOSCOPIC SUPPLIES) IMPLANT
WATER STRL 1000ML PLASTIC PR BTL LF (MED SURG SUPPLIES) IMPLANT

## 2021-04-09 NOTE — Anesthesia Postprocedure Evaluation (Signed)
Anesthesia Post Op Evaluation    Patient: Sean Wall  Procedure(s):  COLONOSCOPY WITH BIOPSY    Last Vitals:Temperature: 36.1 C (97 F) (04/09/21 1115)  Heart Rate: 60 (04/09/21 1115)  BP (Non-Invasive): 116/83 (04/09/21 1115)  Respiratory Rate: 14 (04/09/21 1115)  SpO2: 94 % (04/09/21 123XX123)    No complications documented.    Patient is sufficiently recovered from the effects of anesthesia to participate in the evaluation and has returned to their pre-procedure level.  Patient location during evaluation: PACU       Patient participation: complete - patient participated  Level of consciousness: awake and alert    Pain management: adequate    Anesthetic complications: no  Cardiovascular status: acceptable  Respiratory status: acceptable    PONV Status: Absent

## 2021-04-09 NOTE — Nurses Notes (Signed)
Bedside report received from [place], [place]. Patient resting in bed. Respirations even and unlabored. Bed locked and in low position. Safety measures in place. Call light within reach. Will continue to monitor throughout shift.

## 2021-04-09 NOTE — Anesthesia Preprocedure Evaluation (Signed)
ANESTHESIA PRE-OP EVALUATION  Planned Procedure: COLONOSCOPY (N/A )  Review of Systems     anesthesia history negative     patient summary reviewed          Pulmonary   current smoker (Occ. cigar),  no COPD, no asthma and no shortness of breath   Cardiovascular    Hypertension ,No past MI and no angina,  Exercise Tolerance: > or = 4 METS        GI/Hepatic/Renal    GERD        Endo/Other   neg endo/other ROS,       Neuro/Psych/MS    headaches and depression     Cancer                      Physical Assessment      Airway       Mallampati: II    TM distance: >3 FB    Neck ROM: full  Mouth Opening: good.            Dental       Dentition intact             Pulmonary    Breath sounds clear to auscultation       Cardiovascular    Rhythm: regular  Rate: Normal       Other findings            Plan  ASA 2     Planned anesthesia type: general     general anesthesia with laryngeal mask airway                PONV/POV Plan:  I plan to administer pharmcologic prophalaxis antiemetics  Intravenous induction     Anesthesia issues/risks discussed are: PONV, Dental Injuries, Post-op Pain Management, Cardiac Events/MI, Aspiration, Sore Throat, Intraoperative Awareness/ Recall, Stroke and Blood Loss.  Anesthetic plan and risks discussed with patient  Signed consent obtained            Patient's NPO status is appropriate for Anesthesia.           Plan discussed with CRNA.

## 2021-04-09 NOTE — OR Surgeon (Signed)
Sean Wall  [birth year]  04/09/2021    Name of procedure:  Colonoscopy with biopsy    Indication: Rectal bleeding    Anesthesia:  Monitored anesthesia care administered by anesthesia attending Dr. Cannon Kettle. CO2 insufflation was used for the entire duration of the procedure.    Endoscopist:  Brooks Sailors, MD    Description of procedure:      Prior to obtaining the procedure, a history and physical as performed and patients medications and allergies were reviewed. The risks of anesthesia complications, bleeding, perforation, mucosal tears and need for the procedure were discussed with patient prior to obtaining informed consent. After adequate sedation, perianal and digital rectal exam was done. The scope was then passed under direct visualization. Through out the entire procedure, the patients blood pressure, pulse and oxygen saturation was monitored continuously. Perianal exam was noirmal.  Colonoscope was introduced through the anus and advanced up to the cecum. The cecum was identified by presence of appendiceal orifice and ileocecal valve. Retroflexion was performed in the rectum. The quality of the bowel prep was assessed using the [facility] Bowel Prep Score (BBPS). The total BBPS is 7. Withdrawal time >6 minutes. The colonoscopy was accomplished without difficulty. Patient tolerated the procedure well, there were no complications and the patient was taken to the recovery room in stable condition.       Findings:    Non bleeding grade 1 internal hemorrhoids were seen on retroflexion.  An area of thickened and dark ulcerated mucosa was seen extending from the distal transverse colon into the descending colon. This area measured approximately 15cm in length and the appearance was suggestive of ischemic colitis. Biopsy taken for histology.  Normal mucosa of the terminal ileum   No blood seen on this exam.      Impression:    Non bleeding small internal hemorrhoids  Mucosal changes suggestive of ischemic colitis of the  transverse colon and descending colon.  Normal terminal ileum mucosa  [place] Bowel Prep Score (BBPS) 7      Recommendations:    Supportive care  IV hydration  Broad spectrum antibiotics  Check lactate level  Rule out infectious causes  Recommend discontinuing Adderall which is a known cause of ischemic colitis  This condition is typically self limiting    Brooks Sailors, MD  Gastroenterologist      CC: [name], MD

## 2021-04-09 NOTE — Progress Notes (Signed)
[facility]  HOSPITALIST PROGRESS NOTE      Sean Wall                     65 y.o. male 407/A    Date of service: 04/09/2021      Date of Admission:  04/08/2021   Code Status: Full Code Sean Haff, MD     Assessment/Plan:  Acute Colitis with hematochezia  Abdominal Pain  Patient with hematochezia and left sided abd pain. Hgb stable at 16. Only 2 gram drop since admit to 14  CT abdomen pelvis showed findings consistent with colitis and thickening of colon in transverse/descending but no obstruction or perforation  Started on Cipro/flagyl - Day 2  GI consulted. He is for colonoscopy today  Hpylori pending  Cont PPI    Constipation secondary to opioid use - resolved  Cont bowel regimen    Hypertension  Continue Lopressor    Hyperlipidemia  Continue Lipitor    GERD  PPI BID    Migraine headaches  Continue Imitrex PRN    BPH  Continue Flomax, Protonix    Depression  mood disorder  Continue home Wellbutrin, Seroquel        DVT prophylaxis: SCDS        HPI/Interval history:  Sean Wall is a 65 year old male with PMH significant for HTN, GERD, BPH, diverticulosis, IBS. He presented to ER with c/o abdominal pain and constipation. He was having diffuse abd pain, and unsuccessful with bowel movement even after disimpaction. He did have frank bright red bleeding in toilet. He had small BM in ER with continued blood. CT abd/pelvis showed diffuse bowel wall thickening in distal transverse and descending colon and pericolonic inflammation and stranding consistent with mild colitis with no obstruction/perf. He was admitted to hospital and GI consulted. HGB stable at 14-16. Started on IV Cipro/flagyl. He is for colonoscopy today.    Subjective:  Resting in bed. Cont to have 8/10 left sided abdominal pain. Denies N/V. Prepped overnight for colonoscopy - states still has some blood in stool. Denies CP/SOB.           PHYSICAL EXAM:  VITALS: BP 116/81   Pulse 73   Temp 36.9 C (98.4 F)   Resp 18   Ht  1.778 m (_0 )   Wt 90.7 kg (200 lb)   SpO2 97%   BMI 28.70 kg/m         GENERAL:   Pt is a pleasant,  65 y.o. male who is resting comfortably in bed in NAD. Appears stated age  HENT:  head normocephalic, symmetrical facies. No rhinorrhea.   Eyes: EOM intact. PERRLA. Sclera non-icteric, non-injected.  NECK: supple. No masses  CV: RRR. No LE edema  LUNGS: CTAB, No rhonchi, rales, wheezes.   GI: (+) BS in all 4 quadrants. soft, tender left sided abdomen, nondistended  GU: deferred  MSK: full AROM. No joint effusions/ swelling/ deformities. Gait not assessed at this time.  SKIN: No significant lesions, rashes, ecchymoses noted.   NEURO: CN grossly intact. Sensation intact b/l. No focal deficits.  PSYCH: A&Ox4. Mood, behavior and affect normal w/ Intact judgement & insight             ANCILLARY DATA:  Results for orders placed or performed during the hospital encounter of 04/08/21 (from the past 24 hour(s))   CBC/DIFF    Narrative    The following orders were created for panel order CBC/DIFF.  Procedure                               Abnormality         Status                     ---------                               -----------         ------                     CBC WITH [id number]]                Abnormal            Final result                 Please view results for these tests on the individual orders.   COMPREHENSIVE METABOLIC PANEL, NON-FASTING   Result Value Ref Range    SODIUM 134 (L) 136 - 145 mmol/L    POTASSIUM 4.1 3.5 - 5.1 mmol/L    CHLORIDE 101 96 - 111 mmol/L    CO2 TOTAL 21 (L) 23 - 31 mmol/L    ANION GAP 12 4 - 13 mmol/L    BUN 20 8 - 25 mg/dL    CREATININE 0.98 0.75 - 1.35 mg/dL    BUN/CREA RATIO 20 6 - 22    ESTIMATED GFR 85 >=60 mL/min/BSA    ALBUMIN 3.8 3.4 - 4.8 g/dL     CALCIUM 9.4 8.8 - 10.2 mg/dL    GLUCOSE 104 65 - 125 mg/dL    ALKALINE PHOSPHATASE 63 45 - 115 U/L    ALT (SGPT) 19 10 - 55 U/L    AST (SGOT)  19 8 - 45 U/L    BILIRUBIN TOTAL 0.8 0.3 - 1.3 mg/dL    PROTEIN TOTAL 7.1  6.0 - 8.0 g/dL   LIPASE   Result Value Ref Range    LIPASE 9 (L) 10 - 60 U/L   URINALYSIS, MACROSCOPIC   Result Value Ref Range    COLOR Yellow Colorless, Straw, Yellow    APPEARANCE Clear Clear    SPECIFIC GRAVITY 1.010 >1.005 - <1.030    PH 7.5 >5.0 - <8.0    PROTEIN Not Detected Not Detected mg/dL    GLUCOSE Not Detected Not Detected mg/dL    KETONES 2+ (A) Not Detected mg/dL    UROBILINOGEN 0.2  Not Detected, 0.2  mg/dL    BILIRUBIN Not Detected Not Detected mg/dL    BLOOD Not Detected Not Detected mg/dL    NITRITE Not Detected Not Detected    LEUKOCYTES Not Detected Not Detected WBCs/uL   CBC WITH DIFF   Result Value Ref Range    WBC 9.6 3.7 - 11.0 x10^3/uL    RBC 5.25 4.50 - 6.10 x10^6/uL    HGB 16.3 13.4 - 17.5 g/dL    HCT 48.0 38.9 - 52.0 %    MCV 91.4 78.0 - 100.0 fL    MCH 31.0 26.0 - 32.0 pg    MCHC 34.0 31.0 - 35.5 g/dL    RDW-CV 13.0 11.5 - 15.5 %    PLATELETS 259 150 - 400 x10^3/uL    MPV 9.6 8.7 - 12.5 fL    NEUTROPHIL % 83 %  LYMPHOCYTE % 7 %    MONOCYTE % 10 %    EOSINOPHIL % 0 %    BASOPHIL % 0 %    NEUTROPHIL # 7.92 (H) 1.50 - 7.70 x10^3/uL    LYMPHOCYTE # 0.67 (L) 1.00 - 4.80 x10^3/uL    MONOCYTE # 0.93 0.20 - 1.10 x10^3/uL    EOSINOPHIL # <0.10 <=0.50 x10^3/uL    BASOPHIL # <0.10 <=0.20 x10^3/uL    IMMATURE GRANULOCYTE % 0 0 - 1 %    IMMATURE GRANULOCYTE # <0.10 <0.10 x10^3/uL   TROPONIN-I   Result Value Ref Range    TROPONIN I <7 (L) 7 - 30 ng/L   PT/INR   Result Value Ref Range    PROTHROMBIN TIME 14.4 (H) 9.7 - 13.6 seconds    INR 1.22 <=5.00   PTT (PARTIAL THROMBOPLASTIN TIME)   Result Value Ref Range    APTT 29.1 26.0 - 39.0 seconds   COVID-19, FLU A/B, RSV RAPID BY PCR   Result Value Ref Range    SARS-CoV-2 Not Detected Not Detected    INFLUENZA VIRUS TYPE A Not Detected Not Detected    INFLUENZA VIRUS TYPE B Not Detected Not Detected    RESPIRATORY SYNCTIAL VIRUS (RSV) Not Detected Not Detected    Narrative    Results are for the simultaneous qualitative identification of SARS-CoV-2  (formerly 2019-nCoV), Influenza A, Influenza B, and RSV RNA. These etiologic agents are generally detectable in nasopharyngeal and nasal swabs during the ACUTE PHASE of infection. Hence, this test is intended to be performed on respiratory specimens collected from individuals with signs and symptoms of upper respiratory tract infection who meet Centers for Disease Control and Prevention (CDC) clinical and/or epidemiological criteria for Coronavirus Disease 2019 (COVID-19) testing. CDC COVID-19 criteria for testing on human specimens is available at [facility] webpage information for Healthcare Professionals: Coronavirus Disease 2019 (COVID-19) ([email or web address]).     False-negative results may occur if the virus has genomic mutations, insertions, deletions, or rearrangements or if performed very early in the course of illness. Otherwise, negative results indicate virus specific RNA targets are not detected, however negative results do not preclude SARS-CoV-2 infection/COVID-19, Influenza, or Respiratory syncytial virus infection. Results should not be used as the sole basis for patient management decisions. Negative results must be combined with clinical observations, patient history, and epidemiological information. If upper respiratory tract infection is still suspected based on exposure history together with other clinical findings, re-testing should be considered.    Disclaimer:   This assay has been authorized by FDA under an Emergency Use Authorization for use in laboratories certified under the Clinical Laboratory Improvement Amendments of 1988 (CLIA), 42 U.S.C. [phone], to perform high complexity tests. The impacts of vaccines, antiviral therapeutics, antibiotics, chemotherapeutic or immunosuppressant drugs have not been evaluated.     Test methodology:   Cepheid Xpert Xpress SARS-CoV-2/Flu/RSV Assay real-time polymerase chain reaction (RT-PCR) test on the GeneXpert Dx and  Xpert Xpress systems.   LACTIC ACID LEVEL W/ REFLEX FOR LEVEL >2.0   Result Value Ref Range    LACTIC ACID 1.2 0.5 - 2.2 mmol/L   H & H   Result Value Ref Range    HGB 15.2 13.4 - 17.5 g/dL    HCT 45.6 38.9 - 52.0 %   H & H   Result Value Ref Range    HGB 14.8 13.4 - 17.5 g/dL    HCT 44.6 38.9 - 52.0 %   C-REACTIVE PROTEIN(CRP),INFLAMMATION   Result Value Ref Range  CRP INFLAMMATION 142.2 (H) <8.0 mg/L   SEDIMENTATION RATE   Result Value Ref Range    ERYTHROCYTE SEDIMENTATION RATE (ESR) 13 0 - 15 mm/hr   CBC/DIFF    Narrative    The following orders were created for panel order CBC/DIFF.  Procedure                               Abnormality         Status                     ---------                               -----------         ------                     CBC WITH GXQJ[[id number]]                Abnormal            Final result                 Please view results for these tests on the individual orders.   MAGNESIUM   Result Value Ref Range    MAGNESIUM 2.0 1.8 - 2.6 mg/dL   BASIC METABOLIC PANEL - AM ONCE   Result Value Ref Range    SODIUM 136 136 - 145 mmol/L    POTASSIUM 4.2 3.5 - 5.1 mmol/L    CHLORIDE 106 96 - 111 mmol/L    CO2 TOTAL 22 (L) 23 - 31 mmol/L    ANION GAP 8 4 - 13 mmol/L    CALCIUM 8.0 (L) 8.8 - 10.2 mg/dL    GLUCOSE 90 65 - 125 mg/dL    BUN 12 8 - 25 mg/dL    CREATININE 0.83 0.75 - 1.35 mg/dL    BUN/CREA RATIO 14 6 - 22    ESTIMATED GFR >90 >=60 mL/min/BSA   CBC WITH DIFF   Result Value Ref Range    WBC 9.5 3.7 - 11.0 x10^3/uL    RBC 4.55 4.50 - 6.10 x10^6/uL    HGB 14.2 13.4 - 17.5 g/dL    HCT 43.0 38.9 - 52.0 %    MCV 94.5 78.0 - 100.0 fL    MCH 31.2 26.0 - 32.0 pg    MCHC 33.0 31.0 - 35.5 g/dL    RDW-CV 13.3 11.5 - 15.5 %    PLATELETS 216 150 - 400 x10^3/uL    MPV 9.7 8.7 - 12.5 fL    NEUTROPHIL % 78 %    LYMPHOCYTE % 10 %    MONOCYTE % 10 %    EOSINOPHIL % 2 %    BASOPHIL % 0 %    NEUTROPHIL # 7.40 1.50 - 7.70 x10^3/uL    LYMPHOCYTE # 0.97 (L) 1.00 - 4.80 x10^3/uL    MONOCYTE # 0.90 0.20  - 1.10 x10^3/uL    EOSINOPHIL # 0.16 <=0.50 x10^3/uL    BASOPHIL # <0.10 <=0.20 x10^3/uL    IMMATURE GRANULOCYTE % 0 0 - 1 %    IMMATURE GRANULOCYTE # <0.10 <0.10 x10^3/uL   TYPE AND SCREEN   Result Value Ref Range    UNITS ORDERED NOT STATED         ABO/RH(D) A POSITIVE     ANTIBODY  SCREEN NEGATIVE     SPECIMEN EXPIRATION DATE 04/11/2021,2359         Imaging:  Results for orders placed or performed during the hospital encounter of 04/08/21 (from the past 72 hour(s))   CT ABDOMEN PELVIS W IV CONTRAST     Status: None    Narrative    History: Diffuse abdominal pain.    CT imaging of the abdomen and pelvis is performed following IV contrast administration. This CT scanner is equipped with dose reducing technology. The mAS is automatically adjusted according to patient size for optimal dose reduction. CT DLP 774.    Review of the lung bases shows minimal emphysematous changes with slight atelectasis. No acute infiltrates are seen.     Within the abdomen, no acute abnormalities are seen involving the liver, spleen, pancreas, kidneys, and adrenal glands. The gallbladder appears unremarkable.    Review of the bowel shows no evidence for obstruction. There is diffuse bowel wall thickening involving the mid to distal lead transverse colon as well as almost the entire descending colon. There is pericolonic inflammation and stranding with mild thickening along the paracolic gutter. The findings are consistent with a diffuse colitis. There is no perforation. There is a very tiny periumbilical ventral hernia. There are a few scattered colonic diverticula.    Within the pelvis, the bladder appears mildly distended but otherwise unremarkable.. No free fluid is seen. Degenerative and postoperative changes are seen within the mid and lower lumbar spine. Prominent accessory ossicle is suspected at the right acetabulum .      Impression    1. Diffuse bowel wall thickening involving the distal transverse and entire descending colon  with pericolonic inflammation and stranding consistent with mild colitis. There is no obstruction or perforation.        Radiologist location ID: [id number]     XR AP MOBILE CHEST     Status: None    Narrative    Male, 65 years old.    XR AP MOBILE CHEST performed on 04/09/2021 6:00 AM.    REASON FOR EXAM:  colonoscopy    FINDINGS: A single frontal view the chest is submitted for interpretation. There are no prior studies available for direct comparison. Lungs are well-inflated. There are mild chronic appearing interstitial changes. No active infiltrate, failure, pneumothorax or significant effusion is seen. There is likely minimal amount of scarring at the left base. Heart size within normal limits. Bony structures are intact.      Impression    Suspect minor scarring or atelectasis at the left base.      Radiologist location ID: [id number]           Inpatient Medications:  atorvastatin (LIPITOR) tablet, 40 mg, Oral, QPM  buPROPion (WELLBUTRIN SR) sustained release tablet, 300 mg, Oral, Daily  ciprofloxacin (CIPRO) 421m IVPB in D5W 20109mpremix, 400 mg, Intravenous, Q12H  cyclobenzaprine (FLEXERIL) tablet, 10 mg, Oral, 3x/day  finasteride (PROSCAR) tablet, 5 mg, Oral, Daily  gabapentin (NEURONTIN) capsule, 300 mg, Oral, 2x/day  HYDROcodone-acetaminophen (NORCO) 5-325 mg per tablet, 1 Tablet, Oral, Q8H PRN  loratadine (CLARITIN) tablet, 10 mg, Oral, Daily  melatonin tablet, 3 mg, Oral, NIGHTLY  metoprolol tartrate (LOPRESSOR) tablet, 25 mg, Oral, Daily  metroNIDAZOLE (FLAGYL) 500 mg in NS 100 mL premix IVPB, 500 mg, Intravenous, Q12H  NS flush syringe, 3 mL, Intracatheter, Q8HRS  NS flush syringe, 3 mL, Intracatheter, Q1H PRN  NS flush syringe, 3 mL, Intracatheter, Q8HRS  NS flush syringe, 3 mL, Intracatheter, Q1H  PRN  NS premix infusion, , Intravenous, Continuous  ondansetron (ZOFRAN) 2 mg/mL injection, 4 mg, Intravenous, Q6H PRN  pantoprazole (PROTONIX) delayed release tablet, 40 mg, Oral, 2x/day AC  polyethylene  glycol (MIRALAX) oral packet, 17 g, Oral, Daily  simethicone (MYLICON) chewable tablet, 80 mg, Oral, Q6H PRN  SUMAtriptan (IMITREX) tablet, 100 mg, Oral, Once PRN  tamsulosin (FLOMAX) capsule, 0.4 mg, Oral, Daily after Dinner       ______________________________________________________________________    Dr Abran Richard is collaborating physician and immediately available if needed    Meta Hatchet APRN, NP-C

## 2021-04-09 NOTE — Anesthesia Transfer of Care (Signed)
ANESTHESIA TRANSFER OF CARE   Sean Wall is a 65 y.o. ,male, Weight: 90.7 kg (200 lb)   had Procedure(s):  COLONOSCOPY WITH BIOPSY  performed  04/09/21   Primary Service: Rockne Coons, DO    Past Medical History:   Diagnosis Date   . Acid reflux    . Enlarged prostate    . Erectile dysfunction    . Headache    . HTN (hypertension)       Allergy History as of 04/09/21     PROPOFOL       Noted Status Severity Type Reaction    04/08/21 0848 Gaetano Hawthorne, RN 04/08/21 Active Medium  Rash              I completed my transfer of care / handoff to the receiving personnel during which we discussed:   Last OR Temp: Temperature: 36.9 C (98.4 F)  ABG:  POTASSIUM   Date Value Ref Range Status   04/09/2021 4.2 3.5 - 5.1 mmol/L Final     KETONES   Date Value Ref Range Status   04/08/2021 2+ (A) Not Detected mg/dL Final     CALCIUM   Date Value Ref Range Status   04/09/2021 8.0 (L) 8.8 - 10.2 mg/dL Final     Calculated P Axis   Date Value Ref Range Status   04/08/2021 80 degrees Final     Calculated R Axis   Date Value Ref Range Status   04/08/2021 55 degrees Final     Calculated T Axis   Date Value Ref Range Status   04/08/2021 28 degrees Final     Airway:* No LDAs found *  Blood pressure 103/78, pulse 65, temperature 36.9 C (98.4 F), resp. rate 13, height 1.778 m ('5\' 10"'$ ), weight 90.7 kg (200 lb), SpO2 95 %.

## 2021-04-09 NOTE — Nurses Notes (Signed)
Pt resting in bed. Respirations even and unlabored on RA. Pt complains of abdominal discomfort. Rating 6-7/10. Pt repositioned for comfort. IVF infusing per order. Pt reports one bloody liquid BM this AM. Pre-op prep completed for colonoscopy this AM. All safety measures maintained. Laurel Dimmer, RN  04/09/2021, 08:26

## 2021-04-09 NOTE — Care Plan (Signed)
Patient drank go-lite in preparation for colonoscopy. Preop check list complete. Patient was in the restroom several times through the night. Patient NPO after midnight. H pylori breath test complete and sent to lab. Patient slept at long intervals, safety maintained, bed alarm refused, call light within reach, will continue to monitor the remainder of shift.

## 2021-04-09 NOTE — OR PostOp (Signed)
Sean Wall Arrived in PACU at 1031  Rousable and responding on arrival  Breaths regular and unlabored, Oral Airway in place, lips and nailbeds pink  Skin warm and dry to touch  Connected to monitors  Infusing LR in left arm, site normal  Abdomen rounded  1055 - OPA removed, regular breaths, pt drowsy still  1009 - Dr Shawnie Pons at bedside talking to patient    1115 - PASSES GAS , NO DRAINAGE NOTED, TALKING

## 2021-04-09 NOTE — Care Plan (Signed)
Pt resting in bed. Spouse in to visit. IVF infusing per order. IV ATB administered per order. Pt medicated for pain per PRN order. All safety measures maintained. Laurel Dimmer, RN  04/09/2021, 18:33    Problem: Adult Inpatient Plan of Care  Goal: Plan of Care Review  Outcome: Ongoing (see interventions/notes)  Goal: Patient-Specific Goal (Individualized)  Outcome: Ongoing (see interventions/notes)  Goal: Absence of Hospital-Acquired Illness or Injury  Intervention: Identify and Manage Fall Risk  Recent Flowsheet Documentation  Taken 04/09/2021 0800 by Laurel Dimmer, RN  Safety Promotion/Fall Prevention:   activity supervised   fall prevention program maintained  Intervention: Prevent Skin Injury  Recent Flowsheet Documentation  Taken 04/09/2021 0800 by Laurel Dimmer, RN  Body Position: sitting  Intervention: Prevent and Manage VTE (Venous Thromboembolism) Risk  Recent Flowsheet Documentation  Taken 04/09/2021 0800 by Laurel Dimmer, RN  VTE Prevention/Management:   dorsiflexion/plantar flexion performed   ambulation promoted  Goal: Optimal Comfort and Wellbeing  Intervention: Provide Person-Centered Care  Recent Flowsheet Documentation  Taken 04/09/2021 0800 by Laurel Dimmer, RN  Trust Relationship/Rapport:   care explained   choices provided     Problem: Adjustment to Illness (Gastrointestinal Bleeding)  Goal: Optimal Coping with Acute Illness  Outcome: Ongoing (see interventions/notes)     Problem: Bleeding (Gastrointestinal Bleeding)  Goal: Hemostasis  Outcome: Ongoing (see interventions/notes)

## 2021-04-10 DIAGNOSIS — Z79899 Other long term (current) drug therapy: Secondary | ICD-10-CM

## 2021-04-10 DIAGNOSIS — E785 Hyperlipidemia, unspecified: Secondary | ICD-10-CM

## 2021-04-10 DIAGNOSIS — K219 Gastro-esophageal reflux disease without esophagitis: Secondary | ICD-10-CM

## 2021-04-10 DIAGNOSIS — G43909 Migraine, unspecified, not intractable, without status migrainosus: Secondary | ICD-10-CM

## 2021-04-10 DIAGNOSIS — K529 Noninfective gastroenteritis and colitis, unspecified: Secondary | ICD-10-CM

## 2021-04-10 DIAGNOSIS — N400 Enlarged prostate without lower urinary tract symptoms: Secondary | ICD-10-CM

## 2021-04-10 DIAGNOSIS — K559 Vascular disorder of intestine, unspecified: Secondary | ICD-10-CM

## 2021-04-10 DIAGNOSIS — F32A Depression, unspecified: Secondary | ICD-10-CM

## 2021-04-10 DIAGNOSIS — F39 Unspecified mood [affective] disorder: Secondary | ICD-10-CM

## 2021-04-10 DIAGNOSIS — K921 Melena: Secondary | ICD-10-CM

## 2021-04-10 DIAGNOSIS — Z9889 Other specified postprocedural states: Secondary | ICD-10-CM

## 2021-04-10 LAB — CBC WITH DIFF
BASOPHIL #: <0.1 10*3/uL (ref <=0.20)
BASOPHIL %: 0 %
EOSINOPHIL #: 0.35 10*3/uL (ref <=0.50)
EOSINOPHIL %: 5 %
HCT: 42 % (ref 38.9–52.0)
HGB: 13.4 g/dL (ref 13.4–17.5)
IMMATURE GRANULOCYTE #: <0.1 10*3/uL (ref <0.10)
IMMATURE GRANULOCYTE %: 0 % (ref 0–1)
LYMPHOCYTE #: 1.33 10*3/uL (ref 1.00–4.80)
LYMPHOCYTE %: 20 %
MCH: 30.8 pg (ref 26.0–32.0)
MCHC: 31.9 g/dL (ref 31.0–35.5)
MCV: 96.6 fL (ref 78.0–100.0)
MONOCYTE #: 0.64 10*3/uL (ref 0.20–1.10)
MONOCYTE %: 10 %
MPV: 9.8 fL (ref 8.7–12.5)
NEUTROPHIL #: 4.35 10*3/uL (ref 1.50–7.70)
NEUTROPHIL %: 65 %
PLATELETS: 205 10*3/uL (ref 150–400)
RBC: 4.35 10*6/uL — ABNORMAL LOW (ref 4.50–6.10)
RDW-CV: 13.2 % (ref 11.5–15.5)
WBC: 6.7 10*3/uL (ref 3.7–11.0)

## 2021-04-10 LAB — COMPREHENSIVE METABOLIC PANEL, NON-FASTING
ALBUMIN: 2.9 g/dL — ABNORMAL LOW (ref 3.4–4.8)
ALKALINE PHOSPHATASE: 50 U/L (ref 45–115)
ALT (SGPT): 24 U/L (ref 10–55)
ANION GAP: 8 mmol/L (ref 4–13)
AST (SGOT): 24 U/L (ref 8–45)
BILIRUBIN TOTAL: 0.5 mg/dL (ref 0.3–1.3)
BUN/CREA RATIO: 15 (ref 6–22)
BUN: 14 mg/dL (ref 8–25)
CALCIUM: 7.9 mg/dL — ABNORMAL LOW (ref 8.8–10.2)
CHLORIDE: 108 mmol/L (ref 96–111)
CO2 TOTAL: 22 mmol/L — ABNORMAL LOW (ref 23–31)
CREATININE: 0.92 mg/dL (ref 0.75–1.35)
ESTIMATED GFR: >90 mL/min/BSA (ref >=60)
GLUCOSE: 89 mg/dL (ref 65–125)
POTASSIUM: 4.3 mmol/L (ref 3.5–5.1)
PROTEIN TOTAL: 5.2 g/dL — ABNORMAL LOW (ref 6.0–8.0)
SODIUM: 138 mmol/L (ref 136–145)

## 2021-04-10 LAB — ROUTINE STOOL CULTURE

## 2021-04-10 LAB — MAGNESIUM: MAGNESIUM: 1.9 mg/dL (ref 1.8–2.6)

## 2021-04-10 NOTE — Care Plan (Signed)
Abdominal discomfort. Patient received scheduled medication. CPAP used overnight. Patient slept at long intervals, safety maintained, call light within reach, will continue to monitor the remainder of shift.

## 2021-04-10 NOTE — Nurses Notes (Signed)
PATIENT RESTING IN BED.  PATIENT ALERT AND ORIENTED TO PERSON, PLACE, TIME, AND SITUATION.  PATIENT ON ROOM AIR AND DENIES ANY SHORTNESS OF BREATH.  SKIN PINK, WARM, AND DRY.  RESPIRATIONS EVEN AND UNLABORED.  IV FLUIDS INFUSING AS ORDERED TO LEFT FOREARM WITHOUT S/S OF COMPLICATIONS AT IV SITE.  PATIENT DENIES ANY NEEDS AT THIS TIME.  BED ALARM ACTIVE.  CALL LIGHT IN REACH AND SAFETY MAINTAINED.

## 2021-04-10 NOTE — Progress Notes (Signed)
[facility]  HOSPITALIST PROGRESS NOTE      Sean Wall                     65 y.o. male 407/A    Date of service: 04/10/2021      Date of Admission:  04/08/2021   Code Status: Full Code Sean Haff, MD     Assessment/Plan:  Acute Colitis with hematochezia  Abdominal Pain  Patient with hematochezia and left sided abd pain. Hgb stable at 13  CT abdomen pelvis showed findings consistent with colitis and thickening of colon in transverse/descending but no obstruction or perforation  Started on Cipro/flagyl - Day 3  GI consulted. S/p colonoscopy 8/26 - noted mucosal changes suggestive of ischemic colitis of the transverses colon and descending colon. Lactate level is normal.   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 He presented to ER with c/o abdominal pain and constipation. He was having diffuse abd pain, and unsuccessful with bowel movement even after disimpaction. He did have frank bright red bleeding in toilet. He had small BM in ER with continued blood. CT abd/pelvis showed diffuse bowel wall thickening in distal transverse and descending colon and pericolonic inflammation and stranding consistent with mild colitis with no obstruction/perf. He was admitted to hospital and GI consulted. HGB stable. Started on IV Cipro/flagyl. He is s/p colonoscopy 8/26 by Dr Shawnie Pons. Noted mucosal changes suggestive of ischemic colitis of the  transverse and descending colon. Lactate level repeated and again normal. 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     Subjective:  Resting in bed. Cont to have 6-7/10 left sided abdominal pain. Denies N/V. Tolerated full liquids. Denies CP, SOB, cough.            PHYSICAL EXAM:  VITALS: BP 104/66   Pulse 57   Temp 36.7 C (98.1 F)   Resp 14   Ht 1.778 m ('5\' 10"'$ )   Wt 90.7 kg (200 lb)   SpO2 95%   BMI 28.70 kg/m         GENERAL:   Pt is a pleasant,  65 y.o. male who is resting comfortably in bed in NAD. Appears stated age  HENT:  head normocephalic, symmetrical facies. No rhinorrhea.   Eyes: EOM intact. PERRLA. Sclera non-icteric, non-injected.  NECK: supple. No masses  CV: RRR. No LE edema  LUNGS: CTAB, No rhonchi, rales, wheezes.   GI: (+) BS in all 4 quadrants. soft, tender left sided abdomen, nondistended  MSK: full AROM. No joint effusions/ swelling/ deformities. Gait not assessed at this time.  SKIN: No significant lesions, rashes, ecchymoses noted.   NEURO: CN grossly intact. Sensation intact b/l. No focal deficits.  PSYCH: A&Ox4. Mood, behavior  and affect normal w/ Intact judgement & insight             ANCILLARY DATA:  Results for orders placed or performed during the hospital encounter of 04/08/21 (from the past 24 hour(s))   LACTIC ACID LEVEL W/ REFLEX FOR LEVEL >2.0   Result Value Ref Range    LACTIC ACID 1.3 0.5 - 2.2 mmol/L   CBC/DIFF    Narrative    The following orders were created for panel order CBC/DIFF.  Procedure                               Abnormality         Status                     ---------                               -----------         ------                     CBC WITH [id number]                Abnormal            Final result                 Please view results for these tests on the individual orders.   MAGNESIUM   Result Value Ref Range    MAGNESIUM 1.9 1.8 - 2.6 mg/dL    Narrative    Hemolysis can alter results at this  level (slight).   COMPREHENSIVE METABOLIC PANEL, NON-FASTING   Result Value Ref Range    SODIUM 138 136 - 145 mmol/L    POTASSIUM 4.3 3.5 - 5.1 mmol/L    CHLORIDE 108 96 - 111 mmol/L    CO2 TOTAL 22 (L) 23 - 31 mmol/L    ANION GAP 8 4 - 13 mmol/L    BUN 14 8 - 25 mg/dL    CREATININE 0.92 0.75 - 1.35 mg/dL    BUN/CREA RATIO 15 6 - 22    ESTIMATED GFR >90 >=60 mL/min/BSA    ALBUMIN 2.9 (L) 3.4 - 4.8 g/dL     CALCIUM 7.9 (L) 8.8 - 10.2 mg/dL    GLUCOSE 89 65 - 125 mg/dL    ALKALINE PHOSPHATASE 50 45 - 115 U/L    ALT (SGPT) 24 10 - 55 U/L    AST (SGOT)  24 8 - 45 U/L    BILIRUBIN TOTAL 0.5 0.3 - 1.3 mg/dL    PROTEIN TOTAL 5.2 (L) 6.0 - 8.0 g/dL    Narrative    Hemolysis can alter results at this level (slight).  Hemolysis can alter results at this level (slight).   CBC WITH DIFF   Result Value Ref Range    WBC 6.7 3.7 - 11.0 x10^3/uL    RBC 4.35 (L) 4.50 - 6.10 x10^6/uL    HGB 13.4 13.4 - 17.5 g/dL    HCT 42.0 38.9 - 52.0 %    MCV 96.6 78.0 - 100.0 fL    MCH 30.8 26.0 - 32.0 pg    MCHC 31.9 31.0 - 35.5 g/dL    RDW-CV 13.2 11.5 - 15.5 %    PLATELETS 205 150 - 400 x10^3/uL    MPV 9.8 8.7 -  12.5 fL    NEUTROPHIL % 65 %    LYMPHOCYTE % 20 %    MONOCYTE % 10 %    EOSINOPHIL % 5 %    BASOPHIL % 0 %    NEUTROPHIL # 4.35 1.50 - 7.70 x10^3/uL    LYMPHOCYTE # 1.33 1.00 - 4.80 x10^3/uL    MONOCYTE # 0.64 0.20 - 1.10 x10^3/uL    EOSINOPHIL # 0.35 <=0.50 x10^3/uL    BASOPHIL # <0.10 <=0.20 x10^3/uL    IMMATURE GRANULOCYTE % 0 0 - 1 %    IMMATURE GRANULOCYTE # <0.10 <0.10 x10^3/uL        Imaging:  Results for orders placed or performed during the hospital encounter of 04/08/21 (from the past 72 hour(s))   CT ABDOMEN PELVIS W IV CONTRAST     Status: None    Narrative    History: Diffuse abdominal pain.    CT imaging of the abdomen and pelvis is performed following IV contrast administration. This CT scanner is equipped with dose reducing technology. The mAS is automatically adjusted according to patient size for optimal dose  reduction. CT DLP 774.    Review of the lung bases shows minimal emphysematous changes with slight atelectasis. No acute infiltrates are seen.     Within the abdomen, no acute abnormalities are seen involving the liver, spleen, pancreas, kidneys, and adrenal glands. The gallbladder appears unremarkable.    Review of the bowel shows no evidence for obstruction. There is diffuse bowel wall thickening involving the mid to distal lead transverse colon as well as almost the entire descending colon. There is pericolonic inflammation and stranding with mild thickening along the paracolic gutter. The findings are consistent with a diffuse colitis. There is no perforation. There is a very tiny periumbilical ventral hernia. There are a few scattered colonic diverticula.    Within the pelvis, the bladder appears mildly distended but otherwise unremarkable.. No free fluid is seen. Degenerative and postoperative changes are seen within the mid and lower lumbar spine. Prominent accessory ossicle is suspected at the right acetabulum .      Impression    1. Diffuse bowel wall thickening involving the distal transverse and entire descending colon with pericolonic inflammation and stranding consistent with mild colitis. There is no obstruction or perforation.        Radiologist location ID: [id number]     XR AP MOBILE CHEST     Status: None    Narrative    Male, 65 years old.    XR AP MOBILE CHEST performed on 04/09/2021 6:00 AM.    REASON FOR EXAM:  colonoscopy    FINDINGS: A single frontal view the chest is submitted for interpretation. There are no prior studies available for direct comparison. Lungs are well-inflated. There are mild chronic appearing interstitial changes. No active infiltrate, failure, pneumothorax or significant effusion is seen. There is likely minimal amount of scarring at the left base. Heart size within normal limits. Bony structures are intact.      Impression    Suspect minor scarring or atelectasis at the  left base.      Radiologist location ID: [id number]           Inpatient Medications:  atorvastatin (LIPITOR) tablet, 40 mg, Oral, QPM  buPROPion (WELLBUTRIN SR) sustained release tablet, 300 mg, Oral, Daily  ciprofloxacin (CIPRO) '400mg'$  IVPB in D5W 215m premix, 400 mg, Intravenous, Q12H  cyclobenzaprine (FLEXERIL) tablet, 10 mg, Oral, 3x/day  finasteride (PROSCAR) tablet, 5 mg, Oral,  Daily  gabapentin (NEURONTIN) capsule, 300 mg, Oral, 2x/day  HYDROcodone-acetaminophen (NORCO) 5-325 mg per tablet, 1 Tablet, Oral, Q8H PRN  loratadine (CLARITIN) tablet, 10 mg, Oral, Daily  melatonin tablet, 3 mg, Oral, NIGHTLY  metoprolol tartrate (LOPRESSOR) tablet, 25 mg, Oral, Daily  metroNIDAZOLE (FLAGYL) 500 mg in NS 100 mL premix IVPB, 500 mg, Intravenous, Q12H  NS flush syringe, 3 mL, Intracatheter, Q8HRS  NS flush syringe, 3 mL, Intracatheter, Q1H PRN  NS flush syringe, 3 mL, Intracatheter, Q8HRS  NS flush syringe, 3 mL, Intracatheter, Q1H PRN  ondansetron (ZOFRAN) 2 mg/mL injection, 4 mg, Intravenous, Q6H PRN  pantoprazole (PROTONIX) delayed release tablet, 40 mg, Oral, 2x/day AC  polyethylene glycol (MIRALAX) oral packet, 17 g, Oral, Daily  simethicone (MYLICON) chewable tablet, 80 mg, Oral, Q6H PRN  SUMAtriptan (IMITREX) tablet, 100 mg, Oral, Once PRN  tamsulosin (FLOMAX) capsule, 0.4 mg, Oral, Daily after Dinner       ______________________________________________________________________    Dr Kathreen Devoid is collaborating physician and immediately available if needed    Meta Hatchet APRN, NP-C

## 2021-04-10 NOTE — Consults (Signed)
GI Follow up note    Subjective:   Patient reports an overall improvement in his condition and status. No new events overnight. Tolerating oral intake.    Objective:    Filed Vitals:    04/09/21 2352 04/10/21 0121 04/10/21 0434 04/10/21 1014   BP:  104/66     Pulse:  57     Resp:  14  15   Temp:  36.7 C (98.1 F)     SpO2: 94% 95% 95%       Physical exam:   General: Not in distress, comfortable   Chest: Equal air entry bilateral, no ronchi or crepitation   CVS: S1+S2, no Murmurs Gallops or Rubs   Abdomen: Lax, Non tender, +Bowel sounds   Extr: No Lower limb swelling   Neuro: Grossly intact     Labs reviewed   Imaging reviewed     Assessment:    65 year old male admitted with abdominal pain, rectal bleeding found to have colitis on CT imaging.  Colonoscopy was performed and findings are suggestive ischemic colitis.  Overall doing well, vitals are stable and tolerating food today.    Recommendations:    1. Ischemic Colitis  Supportive care  IV hydration  Broad spectrum antibiotics  Follow pending labwork and pathology  Recommend discontinuing Adderall which is a known cause of ischemic colitis  This condition is typically self limiting     GI signing off at this time, please reconsult as needed    Brooks Sailors, MD  Gastroenterology

## 2021-04-10 NOTE — Nurses Notes (Signed)
Bedside report received from [place], [place]. Patient resting in bed. Respirations even and unlabored. Bed locked and in low position. Safety measures in place. Call light within reach. Will continue to monitor throughout shift.

## 2021-04-11 ENCOUNTER — Encounter (HOSPITAL_COMMUNITY): Payer: Self-pay | Admitting: Internal Medicine

## 2021-04-11 DIAGNOSIS — B965 Pseudomonas (aeruginosa) (mallei) (pseudomallei) as the cause of diseases classified elsewhere: Secondary | ICD-10-CM

## 2021-04-11 LAB — CBC WITH DIFF
BASOPHIL #: <0.1 10*3/uL (ref <=0.20)
BASOPHIL %: 0 %
EOSINOPHIL #: 0.44 10*3/uL (ref <=0.50)
EOSINOPHIL %: 8 %
HCT: 41.5 % (ref 38.9–52.0)
HGB: 13.7 g/dL (ref 13.4–17.5)
IMMATURE GRANULOCYTE #: <0.1 10*3/uL (ref <0.10)
IMMATURE GRANULOCYTE %: 1 % (ref 0–1)
LYMPHOCYTE #: 1.38 10*3/uL (ref 1.00–4.80)
LYMPHOCYTE %: 24 %
MCH: 30.9 pg (ref 26.0–32.0)
MCHC: 33 g/dL (ref 31.0–35.5)
MCV: 93.7 fL (ref 78.0–100.0)
MONOCYTE #: 0.56 10*3/uL (ref 0.20–1.10)
MONOCYTE %: 10 %
MPV: 9.5 fL (ref 8.7–12.5)
NEUTROPHIL #: 3.43 10*3/uL (ref 1.50–7.70)
NEUTROPHIL %: 57 %
PLATELETS: 210 10*3/uL (ref 150–400)
RBC: 4.43 10*6/uL — ABNORMAL LOW (ref 4.50–6.10)
RDW-CV: 12.8 % (ref 11.5–15.5)
WBC: 5.9 10*3/uL (ref 3.7–11.0)

## 2021-04-11 LAB — COMPREHENSIVE METABOLIC PANEL, NON-FASTING
ALBUMIN: 3 g/dL — ABNORMAL LOW (ref 3.4–4.8)
ALKALINE PHOSPHATASE: 46 U/L (ref 45–115)
ALT (SGPT): 23 U/L (ref 10–55)
ANION GAP: 8 mmol/L (ref 4–13)
AST (SGOT): 17 U/L (ref 8–45)
BILIRUBIN TOTAL: 0.5 mg/dL (ref 0.3–1.3)
BUN/CREA RATIO: 10 (ref 6–22)
BUN: 9 mg/dL (ref 8–25)
CALCIUM: 8.3 mg/dL — ABNORMAL LOW (ref 8.8–10.2)
CHLORIDE: 108 mmol/L (ref 96–111)
CO2 TOTAL: 23 mmol/L (ref 23–31)
CREATININE: 0.9 mg/dL (ref 0.75–1.35)
ESTIMATED GFR: >90 mL/min/BSA (ref >=60)
GLUCOSE: 99 mg/dL (ref 65–125)
POTASSIUM: 4 mmol/L (ref 3.5–5.1)
PROTEIN TOTAL: 5.3 g/dL — ABNORMAL LOW (ref 6.0–8.0)
SODIUM: 139 mmol/L (ref 136–145)

## 2021-04-11 LAB — MAGNESIUM: MAGNESIUM: 1.7 mg/dL — ABNORMAL LOW (ref 1.8–2.6)

## 2021-04-11 MED ORDER — CYANOCOBALAMIN (VIT B-12) 1,000 MCG/ML INJECTION SOLUTION
1000.0000 ug | INTRAMUSCULAR | Status: AC
Start: 2021-04-11 — End: 2021-04-11
  Administered 2021-04-11 (×2): 1000 ug via SUBCUTANEOUS
  Filled 2021-04-11: qty 1

## 2021-04-11 MED ORDER — MAGNESIUM SULFATE 2 GRAM/50 ML (4 %) IN WATER INTRAVENOUS PIGGYBACK
2.0000 g | INJECTION | Freq: Once | INTRAVENOUS | Status: AC
Start: 2021-04-11 — End: 2021-04-11
  Administered 2021-04-11: 0 g via INTRAVENOUS
  Administered 2021-04-11: 2 g via INTRAVENOUS
  Filled 2021-04-11: qty 50

## 2021-04-11 NOTE — Nurses Notes (Signed)
PATIENT RESTING IN BED.  PATIENT ALERT AND ORIENTED TO PERSON, PLACE, TIME, AND SITUATION.  PATIENT ON ROOM AIR AND DENIES ANY SHORTNESS OF BREATH.  SKIN PINK, WARM, AND DRY.  RESPIRATIONS EVEN AND UNLABORED.  IV LOCK IN LEFT FOREARM WITHOUT S/S OF COMPLICATIONS AT IV SITE.  PATIENT DENIES ANY NEEDS AT THIS TIME.  BED ALARM ACTIVE.  CALL LIGHT IN REACH AND SAFETY MAINTAINED.

## 2021-04-11 NOTE — Progress Notes (Signed)
Sean Wall  HOSPITALIST PROGRESS NOTE      Sean Wall                     65 y.o. male 407/A    Date of service: 04/11/2021      Date of Admission:  04/08/2021   Code Status: Full Code [name], MD     Assessment/Plan:  Acute Colitis with hematochezia  Abdominal Pain  Pseudomonas (+) stool culture  Patient with hematochezia and left sided abd pain. Hgb stable at 13. No further bleeding  CT abdomen pelvis showed findings consistent with colitis and thickening of colon in transverse/descending but no obstruction or perforation  Started on Cipro/flagyl - Day 4  GI consulted. S/p colonoscopy 8/26 - noted mucosal changes suggestive of ischemic colitis of the transverses colon and descending colon. Lactate level is normal.   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 He has signed off  Hpylori pending  Cont PPI  Diet being slowly advanced. Now on GI soft. Pain improved to 4/10 today  Stool culture (+) pseudomonas. He is on Cipro. Will discuss with ID in AM  Hopefully DC in AM if continues to improve.    Constipation secondary to opioid use - resolved  Cont bowel regimen    Hypertension  Continue Lopressor    Hyperlipidemia  Continue Lipitor    GERD  PPI BID    Migraine headaches  Continue Imitrex PRN    BPH  Continue Flomax, Protonix    Depression  mood disorder  Continue home Wellbutrin, Seroquel        DVT prophylaxis: SCDS        HPI/Interval history:  Sean Wall is a 65 year old male with PMH significant for HTN, GERD, BPH, diverticulosis, IBS. He presented to ER with c/o abdominal pain and constipation. He was having diffuse abd pain, and unsuccessful with bowel movement even after disimpaction. He did have frank bright red bleeding in toilet. He had small BM in ER with continued blood. CT abd/pelvis showed diffuse bowel wall thickening in distal transverse and descending colon and pericolonic  inflammation and stranding consistent with mild colitis with no obstruction/perf. He was admitted to [facility] and GI consulted. HGB stable. Started on IV Cipro/flagyl. He is s/p colonoscopy 8/26 by Dr Shawnie Pons. Noted mucosal changes suggestive of ischemic colitis of the transverse and descending colon. Lactate level repeated and again normal. 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     Subjective:  Resting in bed. Increasing diet slowly. Now on GI soft. Pain in LLQ decreased to 4/10 today. Patient would like to see how symptoms are today with increased diet. Discussed likely home in AM if continues to improve. He denies any further bleeding. No N/V/fever/chills            PHYSICAL EXAM:  VITALS: BP 124/80   Pulse 56   Temp 37 C (98.6 F)   Resp 16   Ht 1.778 m ('5\' 10"'$ )   Wt 90.7 kg (200 lb)   SpO2 96%   BMI 28.70 kg/m         GENERAL:   Pt is a pleasant,  65 y.o. male who is resting comfortably in bed in NAD. Appears stated age  HENT:  head normocephalic, symmetrical facies. No rhinorrhea.   Eyes: EOM intact. PERRLA. Sclera non-icteric,  non-injected.  NECK: supple. No masses  CV: RRR. No LE edema  LUNGS: CTAB, No rhonchi, rales, wheezes.   GI: (+) BS in all 4 quadrants. soft, mild tender left sided abdomen, nondistended  MSK: full AROM. No joint effusions/ swelling/ deformities. Gait not assessed at this time.  SKIN: No significant lesions, rashes, ecchymoses noted.   NEURO: CN grossly intact. Sensation intact b/l. No focal deficits.  PSYCH: A&Ox4. Mood, behavior and affect normal w/ Intact judgement & insight             ANCILLARY DATA:  Results for orders placed or performed during the [facility] encounter of 04/08/21 (from the past 24 hour(s))   CBC/DIFF    Narrative    The following orders were created for panel order CBC/DIFF.  Procedure                               Abnormality         Status                     ---------                                -----------         ------                     CBC WITH [id number]                Abnormal            Final result                 Please view results for these tests on the individual orders.   MAGNESIUM   Result Value Ref Range    MAGNESIUM 1.7 (L) 1.8 - 2.6 mg/dL   COMPREHENSIVE METABOLIC PANEL, NON-FASTING   Result Value Ref Range    SODIUM 139 136 - 145 mmol/L    POTASSIUM 4.0 3.5 - 5.1 mmol/L    CHLORIDE 108 96 - 111 mmol/L    CO2 TOTAL 23 23 - 31 mmol/L    ANION GAP 8 4 - 13 mmol/L    BUN 9 8 - 25 mg/dL    CREATININE 0.90 0.75 - 1.35 mg/dL    BUN/CREA RATIO 10 6 - 22    ESTIMATED GFR >90 >=60 mL/min/BSA    ALBUMIN 3.0 (L) 3.4 - 4.8 g/dL     CALCIUM 8.3 (L) 8.8 - 10.2 mg/dL    GLUCOSE 99 65 - 125 mg/dL    ALKALINE PHOSPHATASE 46 45 - 115 U/L    ALT (SGPT) 23 10 - 55 U/L    AST (SGOT)  17 8 - 45 U/L    BILIRUBIN TOTAL 0.5 0.3 - 1.3 mg/dL    PROTEIN TOTAL 5.3 (L) 6.0 - 8.0 g/dL   CBC WITH DIFF   Result Value Ref Range    WBC 5.9 3.7 - 11.0 x10^3/uL    RBC 4.43 (L) 4.50 - 6.10 x10^6/uL    HGB 13.7 13.4 - 17.5 g/dL    HCT 41.5 38.9 - 52.0 %    MCV 93.7 78.0 - 100.0 fL    MCH 30.9 26.0 - 32.0 pg    MCHC 33.0 31.0 - 35.5 g/dL    RDW-CV 12.8 11.5 - 15.5 %    PLATELETS 210  150 - 400 x10^3/uL    MPV 9.5 8.7 - 12.5 fL    NEUTROPHIL % 57 %    LYMPHOCYTE % 24 %    MONOCYTE % 10 %    EOSINOPHIL % 8 %    BASOPHIL % 0 %    NEUTROPHIL # 3.43 1.50 - 7.70 x10^3/uL    LYMPHOCYTE # 1.38 1.00 - 4.80 x10^3/uL    MONOCYTE # 0.56 0.20 - 1.10 x10^3/uL    EOSINOPHIL # 0.44 <=0.50 x10^3/uL    BASOPHIL # <0.10 <=0.20 x10^3/uL    IMMATURE GRANULOCYTE % 1 0 - 1 %    IMMATURE GRANULOCYTE # <0.10 <0.10 x10^3/uL        Imaging:  Results for orders placed or performed during the [facility] encounter of 04/08/21 (from the past 72 hour(s))   CT ABDOMEN PELVIS W IV CONTRAST     Status: None    Narrative    History: Diffuse abdominal pain.    CT imaging of the abdomen and pelvis is performed following IV contrast administration. This CT scanner  is equipped with dose reducing technology. The mAS is automatically adjusted according to patient size for optimal dose reduction. CT DLP 774.    Review of the lung bases shows minimal emphysematous changes with slight atelectasis. No acute infiltrates are seen.     Within the abdomen, no acute abnormalities are seen involving the liver, spleen, pancreas, kidneys, and adrenal glands. The gallbladder appears unremarkable.    Review of the bowel shows no evidence for obstruction. There is diffuse bowel wall thickening involving the mid to distal lead transverse colon as well as almost the entire descending colon. There is pericolonic inflammation and stranding with mild thickening along the paracolic gutter. The findings are consistent with a diffuse colitis. There is no perforation. There is a very tiny periumbilical ventral hernia. There are a few scattered colonic diverticula.    Within the pelvis, the bladder appears mildly distended but otherwise unremarkable.. No free fluid is seen. Degenerative and postoperative changes are seen within the mid and lower lumbar spine. Prominent accessory ossicle is suspected at the right acetabulum .      Impression    1. Diffuse bowel wall thickening involving the distal transverse and entire descending colon with pericolonic inflammation and stranding consistent with mild colitis. There is no obstruction or perforation.        Radiologist location ID: [id number]     XR AP MOBILE CHEST     Status: None    Narrative    Male, 65 years old.    XR AP MOBILE CHEST performed on 04/09/2021 6:00 AM.    REASON FOR EXAM:  colonoscopy    FINDINGS: A single frontal view the chest is submitted for interpretation. There are no prior studies available for direct comparison. Lungs are well-inflated. There are mild chronic appearing interstitial changes. No active infiltrate, failure, pneumothorax or significant effusion is seen. There is likely minimal amount of scarring at the left base. Heart  size within normal limits. Bony structures are intact.      Impression    Suspect minor scarring or atelectasis at the left base.      Radiologist location ID: [id number]           Inpatient Medications:  atorvastatin (LIPITOR) tablet, 40 mg, Oral, QPM  buPROPion (WELLBUTRIN SR) sustained release tablet, 300 mg, Oral, Daily  ciprofloxacin (CIPRO) '400mg'$  IVPB in D5W 238m premix, 400 mg, Intravenous, Q12H  cyanocobalamin (VITAMIN B12)  1000 mcg/mL injection, 1,000 mcg, Subcutaneous, Now  cyclobenzaprine (FLEXERIL) tablet, 10 mg, Oral, 3x/day  finasteride (PROSCAR) tablet, 5 mg, Oral, Daily  gabapentin (NEURONTIN) capsule, 300 mg, Oral, 2x/day  HYDROcodone-acetaminophen (NORCO) 5-325 mg per tablet, 1 Tablet, Oral, Q8H PRN  loratadine (CLARITIN) tablet, 10 mg, Oral, Daily  magnesium sulfate 2 G in SW 50 mL premix IVPB, 2 g, Intravenous, Once  melatonin tablet, 3 mg, Oral, NIGHTLY  metoprolol tartrate (LOPRESSOR) tablet, 25 mg, Oral, Daily  metroNIDAZOLE (FLAGYL) 500 mg in NS 100 mL premix IVPB, 500 mg, Intravenous, Q12H  NS flush syringe, 3 mL, Intracatheter, Q8HRS  NS flush syringe, 3 mL, Intracatheter, Q1H PRN  NS flush syringe, 3 mL, Intracatheter, Q8HRS  NS flush syringe, 3 mL, Intracatheter, Q1H PRN  ondansetron (ZOFRAN) 2 mg/mL injection, 4 mg, Intravenous, Q6H PRN  pantoprazole (PROTONIX) delayed release tablet, 40 mg, Oral, 2x/day AC  polyethylene glycol (MIRALAX) oral packet, 17 g, Oral, Daily  simethicone (MYLICON) chewable tablet, 80 mg, Oral, Q6H PRN  SUMAtriptan (IMITREX) tablet, 100 mg, Oral, Once PRN  tamsulosin (FLOMAX) capsule, 0.4 mg, Oral, Daily after Dinner       ______________________________________________________________________    Dr Kathreen Devoid is collaborating physician and immediately available if needed    Meta Hatchet APRN, NP-C

## 2021-04-11 NOTE — Care Plan (Signed)
Problem: Adult Inpatient Plan of Care  Goal: Plan of Care Review  Outcome: Ongoing (see interventions/notes)  Flowsheets (Taken 04/11/2021 0333)  Plan of Care Reviewed With: patient  Progress: no change  Goal: Patient-Specific Goal (Individualized)  Outcome: Ongoing (see interventions/notes)  Flowsheets (Taken 04/11/2021 0333)  Individualized Care Needs: IV Antibiotics     PATIENT RESTING IN BED.  PATIENT ALERT AND ORIENTED TO PERSON, PLACE, TIME, AND SITUATION.  PATIENT ON ROOM AIR AND DENIES ANY SHORTNESS OF BREATH.  SKIN PINK, WARM, AND DRY.  RESPIRATIONS EVEN AND UNLABORED.  IV FLUIDS INFUSING AS ORDERED TO LEFT FOREARM WITHOUT S/S OF COMPLICATIONS AT IV SITE.  PATIENT RESTED THIS SHIFT WITHOUT COMPLAINT.  BED ALARM ACTIVE.  CALL LIGHT IN REACH AND SAFETY MAINTAINED.

## 2021-04-12 DIAGNOSIS — B965 Pseudomonas (aeruginosa) (mallei) (pseudomallei) as the cause of diseases classified elsewhere: Secondary | ICD-10-CM

## 2021-04-12 DIAGNOSIS — Z9889 Other specified postprocedural states: Secondary | ICD-10-CM

## 2021-04-12 DIAGNOSIS — R109 Unspecified abdominal pain: Secondary | ICD-10-CM

## 2021-04-12 LAB — CBC WITH DIFF
BASOPHIL #: <0.1 10*3/uL (ref <=0.20)
BASOPHIL %: 0 %
EOSINOPHIL #: 0.47 10*3/uL (ref <=0.50)
EOSINOPHIL %: 9 %
HCT: 43.9 % (ref 38.9–52.0)
HGB: 14.2 g/dL (ref 13.4–17.5)
IMMATURE GRANULOCYTE #: <0.1 10*3/uL (ref <0.10)
IMMATURE GRANULOCYTE %: 1 % (ref 0–1)
LYMPHOCYTE #: 1.36 10*3/uL (ref 1.00–4.80)
LYMPHOCYTE %: 27 %
MCH: 30.5 pg (ref 26.0–32.0)
MCHC: 32.3 g/dL (ref 31.0–35.5)
MCV: 94.4 fL (ref 78.0–100.0)
MONOCYTE #: 0.56 10*3/uL (ref 0.20–1.10)
MONOCYTE %: 11 %
MPV: 9.6 fL (ref 8.7–12.5)
NEUTROPHIL #: 2.7 10*3/uL (ref 1.50–7.70)
NEUTROPHIL %: 52 %
PLATELETS: 235 10*3/uL (ref 150–400)
RBC: 4.65 10*6/uL (ref 4.50–6.10)
RDW-CV: 12.7 % (ref 11.5–15.5)
WBC: 5.1 10*3/uL (ref 3.7–11.0)

## 2021-04-12 LAB — COMPREHENSIVE METABOLIC PANEL, NON-FASTING
ALBUMIN: 3.1 g/dL — ABNORMAL LOW (ref 3.4–4.8)
ALKALINE PHOSPHATASE: 51 U/L (ref 45–115)
ALT (SGPT): 45 U/L (ref 10–55)
ANION GAP: 7 mmol/L (ref 4–13)
AST (SGOT): 40 U/L (ref 8–45)
BILIRUBIN TOTAL: 0.4 mg/dL (ref 0.3–1.3)
BUN/CREA RATIO: 10 (ref 6–22)
BUN: 11 mg/dL (ref 8–25)
CALCIUM: 8.6 mg/dL — ABNORMAL LOW (ref 8.8–10.2)
CHLORIDE: 106 mmol/L (ref 96–111)
CO2 TOTAL: 25 mmol/L (ref 23–31)
CREATININE: 1.08 mg/dL (ref 0.75–1.35)
ESTIMATED GFR: 75 mL/min/BSA (ref >=60)
GLUCOSE: 93 mg/dL (ref 65–125)
POTASSIUM: 4.4 mmol/L (ref 3.5–5.1)
PROTEIN TOTAL: 5.5 g/dL — ABNORMAL LOW (ref 6.0–8.0)
SODIUM: 138 mmol/L (ref 136–145)

## 2021-04-12 LAB — ADULT ROUTINE BLOOD CULTURE, SET OF 2 BOTTLES (BACTERIA AND YEAST): BLOOD CULTURE, ROUTINE: NO GROWTH

## 2021-04-12 LAB — SURGICAL PATHOLOGY SPECIMEN

## 2021-04-12 LAB — MAGNESIUM: MAGNESIUM: 1.9 mg/dL (ref 1.8–2.6)

## 2021-04-12 LAB — HELICOBACTER PYLORI BREATH TEST: H. PYLORI,UREA BREATH TEST: NOT DETECTED

## 2021-04-12 MED ORDER — METRONIDAZOLE 500 MG TABLET
500.0000 mg | ORAL_TABLET | Freq: Two times a day (BID) | ORAL | 0 refills | Status: AC
Start: 2021-04-12 — End: 2021-04-21

## 2021-04-12 MED ORDER — CIPROFLOXACIN 500 MG TABLET
500.0000 mg | ORAL_TABLET | Freq: Two times a day (BID) | ORAL | 0 refills | Status: AC
Start: 2021-04-12 — End: 2021-04-21

## 2021-04-12 NOTE — Care Management Notes (Signed)
Received a consult that patient might want to establish with a new PCP. Gave patient the [place] PCP list.    Debroah Baller  04/12/2021, 11:54

## 2021-04-12 NOTE — Nurses Notes (Addendum)
Discharge printed. Education reviewed. IV removed. Patient being discharged home with family member. PO AX. Safety maintained. Will continue to monitor until transport arrives. Patient in no acute distress. Call light within reach. Safety maintained. Will continue to monitor.Rosendo Gros, RN  04/12/2021, 11:09

## 2021-04-12 NOTE — Nurses Notes (Signed)
Patient resting in bed. HOB @ 60 A&O x4. Vitals stable. Patient eating breakfast. IV infusing AX to LFA. Dressing clean dry and in tact. Patient reports bowel movements are not as bloody. Will monitor today. Plans to D/C possibly once recommendation from Infectious disease for Home AX. Call light within reach. Safety maintained. Will continue to monitor.Rosendo Gros, RN  04/12/2021, 08:02

## 2021-04-12 NOTE — Discharge Summary (Addendum)
Sean Wall                          [birth year] MRN/CSN: [id number]   Admission date: 04/08/2021 Discharge date: 04/12/2021   PCP: [name], MD Code Status: Full Code       Discharge Diagnoses:  1. Acute colitis - suspected ischemic colitis with hematochezia (resolved)  2. Abdomina pain - improved  3. Pseudomonas stool culture  4. Constipation - resolved  5. Essential HTN  6. HLD  7. GERD  8. Migraine headaches  9. BPH  10. Depression/mood disorder      Hospital Course:   Sean Wall is a 66 y.o., White male with PMH significant for HTN, GERD, BPH, diverticulosis, IBS. He presented to ER with c/o abdominal pain and constipation. He was having diffuse abd pain, and unsuccessful with bowel movement even after disimpaction. He did have frank bright red bleeding in toilet. He had small BM in ER with continued blood. CT abd/pelvis showed diffuse bowel wall thickening in distal transverse and descending colon and pericolonic inflammation and stranding consistent with mild colitis with no obstruction/perf. He was admitted to hospital and GI consulted. HGB stable. Started on IV Cipro/flagyl. He is s/p colonoscopy 8/26 by Dr Shawnie Pons. Noted mucosal changes suggestive of ischemic colitis of the transverse and descending colon. Lactate level repeated and again normal. 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 Patient abd pain slowly improved. He received 5 days Cipro/flagyl in hospital. Stool culture (+) pseudomonas. Discussed with Dr Shawnie Pons poss vascular eval for his ischemic colitis and he noted they would like rec supportive care. Will have him follow up outpatient for further eval. He is hemodynamically stable for DC home. His abd pain is much improved and his hematochezia has resolved.     Recommendations:  F/u with PCP in 1 week  F/u with Dr Shawnie Pons in 2 weeks  Outpatient referral to vascular for ischemic  colitis  Cipro/flagyl for 9 more days to complete 14 day course  Stop Adderall which is known cause of ischemic colitis  Hold Seroquel while on Cipro - then ok to resume.   Repeat BMP/CBC next week with PCP      PHYSICAL EXAM:  VITALS: BP 139/74   Pulse 59   Temp 37 C (98.6 F)   Resp 20   Ht 1.778 m ('5\' 10"'$ )   Wt 90.7 kg (200 lb)   SpO2 97%   BMI 28.70 kg/m         GENERAL:   Pt is a pleasant, well-nourished, well-developed 65 y.o. male who is resting comfortably in bed in NAD. Appears stated age  HENT:  head normocephalic, symmetrical facies. No rhinorrhea. Oropharyngeal mucous membranes are moist w/o erythema/exudates   Eyes: EOM intact. PERRLA. Sclera non-icteric, non-injected.  NECK: supple. No masses, lymphadenopathy, JVD, or carotid bruits on exam. Trachea midline, no thyromegaly   CV: RRR. No murmurs, rubs, gallops. No BLE edema  LUNGS: CTAB, No rhonchi, rales, wheezes.   GI: (+) BS in all 4 quadrants. soft, very mild LLQ tenderness, nondistended  MSK: full AROM. No joint effusions/ swelling/ deformities.   SKIN: No significant lesions, rashes, ecchymoses noted.   NEURO: CN grossly intact. Sensation intact b/l. No focal deficits.  PSYCH: A&Ox4. Mood, behavior and affect normal w/ Intact judgement & insight  Results for orders placed or performed during the hospital encounter of 04/08/21 (from the past 24 hour(s))   CBC/DIFF    Narrative    The following orders were created for panel order CBC/DIFF.  Procedure                               Abnormality         Status                     ---------                               -----------         ------                     CBC WITH [id number]                                    Final result                 Please view results for these tests on the individual orders.   MAGNESIUM   Result Value Ref Range    MAGNESIUM 1.9 1.8 - 2.6 mg/dL   COMPREHENSIVE METABOLIC PANEL, NON-FASTING   Result Value Ref Range    SODIUM 138 136 - 145 mmol/L     POTASSIUM 4.4 3.5 - 5.1 mmol/L    CHLORIDE 106 96 - 111 mmol/L    CO2 TOTAL 25 23 - 31 mmol/L    ANION GAP 7 4 - 13 mmol/L    BUN 11 8 - 25 mg/dL    CREATININE 1.08 0.75 - 1.35 mg/dL    BUN/CREA RATIO 10 6 - 22    ESTIMATED GFR 75 >=60 mL/min/BSA    ALBUMIN 3.1 (L) 3.4 - 4.8 g/dL     CALCIUM 8.6 (L) 8.8 - 10.2 mg/dL    GLUCOSE 93 65 - 125 mg/dL    ALKALINE PHOSPHATASE 51 45 - 115 U/L    ALT (SGPT) 45 10 - 55 U/L    AST (SGOT)  40 8 - 45 U/L    BILIRUBIN TOTAL 0.4 0.3 - 1.3 mg/dL    PROTEIN TOTAL 5.5 (L) 6.0 - 8.0 g/dL   CBC WITH DIFF   Result Value Ref Range    WBC 5.1 3.7 - 11.0 x10^3/uL    RBC 4.65 4.50 - 6.10 x10^6/uL    HGB 14.2 13.4 - 17.5 g/dL    HCT 43.9 38.9 - 52.0 %    MCV 94.4 78.0 - 100.0 fL    MCH 30.5 26.0 - 32.0 pg    MCHC 32.3 31.0 - 35.5 g/dL    RDW-CV 12.7 11.5 - 15.5 %    PLATELETS 235 150 - 400 x10^3/uL    MPV 9.6 8.7 - 12.5 fL    NEUTROPHIL % 52 %    LYMPHOCYTE % 27 %    MONOCYTE % 11 %    EOSINOPHIL % 9 %    BASOPHIL % 0 %    NEUTROPHIL # 2.70 1.50 - 7.70 x10^3/uL    LYMPHOCYTE # 1.36 1.00 - 4.80 x10^3/uL    MONOCYTE # 0.56 0.20 - 1.10 x10^3/uL    EOSINOPHIL # 0.47 <=0.50 x10^3/uL    BASOPHIL # <0.10 <=  0.20 x10^3/uL    IMMATURE GRANULOCYTE % 1 0 - 1 %    IMMATURE GRANULOCYTE # <0.10 <0.10 x10^3/uL        Pending Labs and Procedures     Order Current Status    HELICOBACTER PYLORI BREATH TEST In process    SURGICAL PATHOLOGY SPECIMEN In process    ADULT ROUTINE BLOOD CULTURE, SET OF 2 ADULT BOTTLES (BACTERIA AND YEAST) Preliminary result    ADULT ROUTINE BLOOD CULTURE, SET OF 2 ADULT BOTTLES (BACTERIA AND YEAST) Preliminary result          Imaging:  CT ABDOMEN PELVIS W IV CONTRAST    Result Date: 04/08/2021  History: Diffuse abdominal pain. CT imaging of the abdomen and pelvis is performed following IV contrast administration. This CT scanner is equipped with dose reducing technology. The mAS is automatically adjusted according to patient size for optimal dose reduction. CT DLP 774. Review of the  lung bases shows minimal emphysematous changes with slight atelectasis. No acute infiltrates are seen. Within the abdomen, no acute abnormalities are seen involving the liver, spleen, pancreas, kidneys, and adrenal glands. The gallbladder appears unremarkable. Review of the bowel shows no evidence for obstruction. There is diffuse bowel wall thickening involving the mid to distal lead transverse colon as well as almost the entire descending colon. There is pericolonic inflammation and stranding with mild thickening along the paracolic gutter. The findings are consistent with a diffuse colitis. There is no perforation. There is a very tiny periumbilical ventral hernia. There are a few scattered colonic diverticula. Within the pelvis, the bladder appears mildly distended but otherwise unremarkable.. No free fluid is seen. Degenerative and postoperative changes are seen within the mid and lower lumbar spine. Prominent accessory ossicle is suspected at the right acetabulum .     1. Diffuse bowel wall thickening involving the distal transverse and entire descending colon with pericolonic inflammation and stranding consistent with mild colitis. There is no obstruction or perforation. Radiologist location ID: [id number]     XR AP MOBILE CHEST    Result Date: 04/09/2021  Male, 65 years old. XR AP MOBILE CHEST performed on 04/09/2021 6:00 AM. REASON FOR EXAM:  colonoscopy FINDINGS: A single frontal view the chest is submitted for interpretation. There are no prior studies available for direct comparison. Lungs are well-inflated. There are mild chronic appearing interstitial changes. No active infiltrate, failure, pneumothorax or significant effusion is seen. There is likely minimal amount of scarring at the left base. Heart size within normal limits. Bony structures are intact.     Suspect minor scarring or atelectasis at the left base. Radiologist location ID: [id number]     ECG 12 LEAD    Result Date: 04/08/2021  Normal sinus  rhythm Normal ECG  REVIEWED BY DR. NOAH CHILDERS Confirmed by Hoover Browns [phone]), editor [place], MEGAN [id number]) on 04/08/2021 10:46:10 AM        Discharge Medications:     Current Discharge Medication List      START taking these medications.      Details   ciprofloxacin HCl 500 mg Tablet  Commonly known as: CIPRO   500 mg, Oral, 2 TIMES DAILY  Qty: 18 Tablet  Refills: 0     metroNIDAZOLE 500 mg Tablet  Commonly known as: FLAGYL   500 mg, Oral, 2 TIMES DAILY  Qty: 18 Tablet  Refills: 0        CONTINUE these medications - NO CHANGES were made during your visit.  Details   atorvastatin 40 mg Tablet  Commonly known as: LIPITOR   40 mg, Oral, EVERY EVENING  Refills: 0     azelastine-fluticasone 137-50 mcg/spray Spray, Non-Aerosol   2 Sprays, Nasal, NIGHTLY  Refills: 0     buPROPion 150 mg tablet sustained-release 12 hr  Commonly known as: WELLBUTRIN SR   300 mg, Oral, DAILY  Refills: 0     cetirizine 10 mg Tablet  Commonly known as: ZYRTEC   10 mg, Oral, DAILY  Refills: 0     * Cialis 10 mg Tablet  Generic drug: Tadalafil   10 mg, Oral, EVERY 24 HOURS PRN  Refills: 0     * Tadalafil 10 mg Tablet  Commonly known as: CIALIS   10 mg, Oral, EVERY 24 HOURS PRN  Refills: 0     cyanocobalamin 1,000 mcg/mL Solution  Commonly known as: VITAMIN B12   1,000 mcg, EVERY 30 DAYS  Refills: 0     cyclobenzaprine 10 mg Tablet  Commonly known as: FLEXERIL   10 mg, Oral, 3 TIMES DAILY PRN  Refills: 0     ferrous sulfate 324 mg (65 mg iron) Tablet, Delayed Release (E.C.)  Commonly known as: FERATAB   324 mg, Oral, EVERY OTHER DAY, On Monday, Wednesday, friday  Refills: 0     finasteride 5 mg Tablet  Commonly known as: PROSCAR   5 mg, Oral, DAILY  Refills: 0     gabapentin 300 mg Capsule  Commonly known as: NEURONTIN   300 mg, Oral, 3 TIMES DAILY  Refills: 0     HYDROcodone-acetaminophen 5-325 mg Tablet  Commonly known as: NORCO   1 Tablet, Oral, EVERY 8 HOURS PRN  Refills: 0     Ibuprofen 600 mg Tablet  Commonly known as: MOTRIN   600  mg, Oral, 4 TIMES DAILY PRN  Refills: 0     melatonin 3 mg Capsule   6 mg, Oral, NIGHTLY  Refills: 0     metoprolol tartrate 25 mg Tablet  Commonly known as: LOPRESSOR   25 mg, Oral, DAILY  Refills: 0     pantoprazole 20 mg Tablet, Delayed Release (E.C.)  Commonly known as: PROTONIX   20 mg, Oral, EVERY MORNING BEFORE BREAKFAST  Refills: 0     simethicone 80 mg Tablet, Chewable  Commonly known as: MYLICON   80 mg, Oral, EVERY 6 HOURS PRN  Refills: 0     sumatriptan succinate 100 mg Tablet  Commonly known as: IMITREX   100 mg, Oral, ONCE PRN, May repeat in 2 hours in needed  Refills: 0     tamsulosin 0.4 mg Capsule  Commonly known as: FLOMAX   0.4 mg, Oral, EVERY EVENING AFTER DINNER  Refills: 0     Testosterone 1 % (25 mg/2.5gram) Gel in Packet   2 Each, Transdermal, DAILY, 2 pumps each shoulder daily am  Refills: 0         * This list has 2 medication(s) that are the same as other medications prescribed for you. Read the directions carefully, and ask your doctor or other care provider to review them with you.            STOP taking these medications.    dextroamphetamine-amphetamine 10 mg Tablet  Commonly known as: ADDERALL     QUEtiapine 25 mg Tablet  Commonly known as: SEROQUEL            Discharge Instructions:     Refer to [facility] Surgery,Medical  Office Bldg B   Referral Type: Physician Referral-Office Visits   Number of Visits Requested: 1     DISCHARGE INSTRUCTION - CARDIAC DIET     Diet: CARDIAC DIET      DISCHARGE INSTRUCTION - ACTIVITY AS TOLERATED     Activity: AS TOLERATED      DISCHARGE INSTRUCTION - MISC    Keep all follow up appointments  Take all medications as prescribed   Return to ER if symptoms return or worsen.         Discussed with the Pt the use of DME after DC, including, but not limited to the following: oxygen, nebulizers, walker, wheelchair & the assistance of home health services if ordered.     D/C Disposition: Home  D/C CONDITION: stable  RISK: Pt is at increased risk for  decompensation & readmission d/t multiple comorbidities.  TIME OF DISCHARGE: 37  minutes    Follow-up Appointments:   Follow-up Information     [name], MD In 1 week.    Specialty: FAMILY PRACTICE  Contact information:  [place] [address]  [phone] [id number]             Brooks Sailors, MD In 2 weeks.    Specialty: GASTROENTEROLOGY  Contact information:  [address]  [place] [address]  [phone]             Conard Novak, MD In 2 weeks.    Specialties: VASCULAR SURGERY, GENERAL SURGERY  Why: ischemic colitis  Contact information:  [address]  [place] [address]  [phone]                           Dr Arlan Organ  is collaborating physician and immediately available if needed    Meta Hatchet APRN, NP-C          Copies sent to Care Team       Relationship Specialty Notifications Start End    [name], MD PCP - [place]  01/20/21     Phone: [phone] Fax: [place] [phone]          Referring providers can utilize [email or web address] to access their referred [facility] patient's information.

## 2021-04-12 NOTE — Care Plan (Signed)
Problem: Adult Inpatient Plan of Care  Goal: Plan of Care Review  Outcome: Ongoing (see interventions/notes)  Flowsheets (Taken 04/12/2021 0442)  Plan of Care Reviewed With: patient  Progress: no change  Goal: Patient-Specific Goal (Individualized)  Outcome: Ongoing (see interventions/notes)  Flowsheets (Taken 04/12/2021 0442)  Individualized Care Needs: Pain Control     PATIENT RESTING IN BED.  PATIENT ALERT AND ORIENTED TO PERSON, PLACE, TIME, AND SITUATION.  PATIENT ON ROOM AIR AND DENIES ANY SHORTNESS OF BREATH.  SKIN PINK, WARM, AND DRY.  RESPIRATIONS EVEN AND UNLABORED.  IV LOCK IN LEFT FOREARM WITHOUT S/S OF COMPLICATIONS AT IV SITE.  PATIENT REPORTED ABDOMINAL PAIN THIS SHIFT WHICH WAS CONTROLLED WITH CURRENT PRN PAIN MEDICATION.  BED ALARM ACTIVE.  CALL LIGHT IN REACH AND SAFETY MAINTAINED.

## 2021-04-13 LAB — ADULT ROUTINE BLOOD CULTURE, SET OF 2 BOTTLES (BACTERIA AND YEAST): BLOOD CULTURE, ROUTINE: NO GROWTH

## 2021-04-21 NOTE — Progress Notes (Signed)
NEUROPSYCHOLOGICAL EVALUATION    PATIENT NAME:  Sean Wall, Sean Wall NUMBER:  [id number]  DATE OF SERVICE:  04/05/2021  DATE OF BIRTH:  1955/09/24  TIME IN/OUT:   8:15/11:20 a.m.     CPT CODES:   [id number] = 1 hour (8:15/9:00 a.m., clinical interview and evaluation with the patient to obtain medical/psychiatric/psychosocial background, history and presenting concerns).   [id number] = 1 hour (analysis, clinical decision making).   [id number] = 1 hour (integration, report)   [id number] = 1 unit (9:05/9:35 a.m. test administration by technician (JF)).   [id number] = 5 units (9:36/11:20 a.m., test administration by technician; +45 minutes test scoring.     April 05, 2021     Toy Cookey, MD   [place], [place] [address]     Dear Dr. Barbette Merino:     As requested, I saw this 65 year old male for a neuropsychological evaluation. As you know, the patient's medical history is remarkable for nerve pain, hyperlipidemia, hypertension, and sleep apnea (CPAP compliant). Family history is unremarkable for any neurodegenerative process such as dementia. The patient had an evaluation performed in 2009 to assess the potential for ADHD.  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  He is no longer taking this medication, though is considering reinitiating this medication due to the attentional difficulties he is currently experiencing which prompted the current evaluation.  Results from the previous evaluation also revealed elevated emotional symptoms including depression and anxiety.     When asked about his current cognitive functioning, the patient reported short-term memory difficulties such as forgetting details from conversation and recent events.  He denied any long-term memory difficulties.  He noted longstanding attentional difficulties as he becomes distracted easily and also has difficulty  multitasking as he starts tasks but does not complete them.  He noted that his processing speed is reduced secondary to the attentional difficulties noted above.  He reported word-finding difficulties though this does not appear to interfere with conversation.  He denied any changes in his decision-making or visual-spatial abilities.  Regarding his ADLs/IADLs, basic ADLs remain intact, he noted that he forgets his medication approximately once per week and denied any difficulties with financial management or driving.     When asked about his current mood, the patient reported symptoms of depression which include withdrawal, isolation, disinterest, and reduced mood overall.  He reported increased anxiety at times which appears more generalized in nature.  He is on psychotropic medication (bupropion) which is somewhat beneficial.  He denied participation in counseling or therapy at the current time.  Regarding his sleep, he has used his CPAP as prescribed since 2004, which is helpful.  He denied difficulties with sleep initiation, though described difficulties with maintenance as he cannot fall asleep after awakening due to thinking of the events of the upcoming day.  He reported that his appetite is reduced, but he is unsure why.  He reported recreational alcohol use and denied the use of illicit drugs or nicotine.  Thoughts of self-harm were denied.  The patient received his Bachelor's degree in Engineering and his Master's degree in System Management and denied any difficulties during his early education.  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  He currently resides with his wife and reported having a good support system in place.     Behavioral  observations:  The patient arrived to the appointment unaccompanied.  He was alert throughout.  Movements and speech were normal.  Mood was euthymic with congruent affect.  He was friendly and cooperative throughout.  Results from embedded  measures of performance validity suggested that he was adequately engaged and the following results were thought to be an accurate representation of his current cognitive abilities.       Neurocognitive Results:  Single word reading word recognition was in the average range and suggestive of an average level of premorbid intellect.  Vocabulary was average.  Confrontation naming was intact.  Phonemic and semantic fluency were low average.  Focused auditory attention and concentration were average.  Working Marine scientist as assessed by a verbal sequencing task was average.  General cognitive efficiency for speeded word reading and speeded color naming were below average and he did not evidence inhibition difficulties on an interference task.  Graphomotor processing speed and graphomotor set shifting were both average.  Nonverbal abstract reasoning was above-average.  His copy of a complex figure was intact.  On a novel problem-solving task, his performance was within normal limits suggesting the intact ability to conceptualize the rules of the task and modify his responses based on feedback he received regarding his performance.  Visual-spatial skills were intact based on his intact performance matching the angles of lines.     On a verbal learning through repetition task, his performance was in the high average range across the trials (6, 10, 9, 12, 10 out of 16 words).  Following a distractor list, free recall was low average, though improved to average when cues were given where retrieval was intact.  Following a long delay, performance was average where retrieval was again intact.  Recognition was reduced secondary to several false positive errors suggestive of inattention and/or reduced self-monitoring.  On a contextual based memory task, his immediate memory performance was average and delayed memory was low average where he demonstrated retrieval difficulties.  Recognition was also reduced.  On a visual learning  through repetition task, his performance across the trials was average (5, 5, 12 out of 12) as was his performance following a delay where retrieval was intact.  Recognition was perfect.  On self-report measures, he evidenced moderate symptoms of depression and severe symptoms of anxiety.  Thoughts of self-harm were denied.        Impressions:  In conclusion, results from the current neuropsychological evaluation reveal that this individual maintains an estimated average level of premorbid intellect. Cognitive testing revealed difficulties related to information processing speed and very mild executive difficulties (reduced self-monitoring).  Visual learning and memory was fully intact. Verbal learning and memory revealed intact encoding with variable retrieval.  Retrieval was improved for information provided in piecemeal format (list form) opposed to information provided with contextual support (story form).  This may also suggest an executive/attentional component (difficulty organizing and recalling information when a larger amount is provided at one time.  Given this variability in retrieval (opposed to consistent difficulties), this is less likely suggestive of a memory deficit per se and more related to fluctuations in attention.     While focused attention was intact on testing, fluctuations in attention throughout the evaluation may have contributed to some of the reduced performances noted above.  That being said, the current profile is not reflective of what is typically seen with ADHD as the extent of deficits and the extent of variability that are commonly seen in those with ADHD are not  present.  I believe the most prominent factor contributing to his perceived attentional difficulties are his elevated emotional stress based on his responses during the interview as well as those responses on self-reported measures which indicated moderate symptoms of depression and severe symptoms of anxiety.  While he  is prescribed psychotropic medication currently, it may be advantageous for him to be referred to a psychiatrist to see if there is a more appropriate medication to better control these symptoms.  I would also highly recommend that this patient consider initiating a form of behavioral intervention such as therapy or counseling so that he can develop adaptive coping mechanisms for dealing with distress given that these emotional symptoms are known to contribute to fluctuations in attention and cognition overall.  I would also like to note that based on the performances noted above, along with his intact activities of daily living, there is no suggestion of a neurodegenerative process such as dementia at the current time.     The patient would benefit from implementing compensatory strategies to offset that difficulties that he is currently reporting.  For example, the patient should document information which he can then refer to later if needed to reduce mental burden.  Also, when completing tasks, whenever possible he should attempt to break larger information/tasks down into smaller parts to assist with task completion.  Overall, he should take steps to improve his organization overall.  I would also suggest he begin using a pillbox for medication management to ensure that he does not miss doses of his medication.  The results from the current evaluation can serve as a baseline if any changes are noticed in the future.  If so desired, I am happy to meet with this patient to provide feedback with the results along with the impressions and recommendations, and also suggest additional compensatory strategies at that time.       Thank you for involving me in the care of this patient.  If there is any additional information that I can provide, please not hesitate to contact me.       Sincerely,        Haywood Filler, PhD  Associate Professor   [facility]      CC:   Toy Cookey, MD   Fax: [phone]       DD:  04/21/2021 08:00:10  DT:  04/21/2021 10:22:26 [place]  D#:  [id number]

## 2021-04-22 ENCOUNTER — Encounter (HOSPITAL_BASED_OUTPATIENT_CLINIC_OR_DEPARTMENT_OTHER): Payer: Self-pay

## 2021-04-26 ENCOUNTER — Other Ambulatory Visit: Payer: Self-pay

## 2021-04-26 ENCOUNTER — Ambulatory Visit: Payer: 59 | Attending: Gastroenterology | Admitting: Gastroenterology

## 2021-04-26 ENCOUNTER — Encounter (HOSPITAL_BASED_OUTPATIENT_CLINIC_OR_DEPARTMENT_OTHER): Payer: Self-pay | Admitting: Gastroenterology

## 2021-04-26 VITALS — BP 112/70 | Ht 70.0 in | Wt 196.2 lb

## 2021-04-26 DIAGNOSIS — K559 Vascular disorder of intestine, unspecified: Secondary | ICD-10-CM | POA: Insufficient documentation

## 2021-04-26 DIAGNOSIS — K580 Irritable bowel syndrome with diarrhea: Secondary | ICD-10-CM | POA: Insufficient documentation

## 2021-04-26 MED ORDER — DICYCLOMINE 10 MG CAPSULE
10.0000 mg | ORAL_CAPSULE | Freq: Four times a day (QID) | ORAL | 3 refills | Status: AC | PRN
Start: 2021-04-26 — End: 2021-07-25

## 2021-04-26 NOTE — Progress Notes (Signed)
[facility]  Patient ID: Sean Wall is a 65 y.o. male.   PCP: [name], MD   Chief complaint: Ischemic colitis, IBS     History of Presenting Illness:     This is a 65 year old man past medical history of GERD, IBS, hypertension, migraine, depression who was seen as inpatient for rectal bleeding 2 weeks ago.  He comes today for follow-up after discharge.  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  Pathology was consistent with ischemic colitis.  Since his discharge he has been feeling well with no new complaints.  Patient has a baseline of abdominal discomfort and diarrhea which is chronic and has been going on for many years which he attributes to his IBS. Bowel habits are mainly diarrhea. Had a formed bowel motion last night the first time. He had an image on his phone which shows thin caliber formed stool.    Has a lot of stress, recently bought a house and has had multiple issues since acquiring the house.    Family History of Colon Polyps/Colon Cancer/GI Malignancies: None    Endoscopy History:   Colonoscopy 04/08/21    Non bleeding small internal hemorrhoids  Mucosal changes suggestive of ischemic colitis of the transverse colon and descending colon.  Normal terminal ileum mucosa  Boston Bowel Prep Score (BBPS) 7    Multiple EGDs in the past (7)    Review of Systems   Constitutional: Negative for activity change, appetite change, chills and diaphoresis.   HENT: Negative for congestion.    Eyes: Negative for discharge.   Respiratory: Negative for apnea, cough, choking, chest tightness, shortness of breath, wheezing and stridor.    Cardiovascular: Negative for chest pain, palpitations and leg swelling.   Gastrointestinal: Positive for diarrhea. Negative for abdominal distention, abdominal pain, anal bleeding, blood in stool, constipation, nausea, rectal pain and vomiting.    Endocrine: Negative for cold intolerance.   Genitourinary: Negative for difficulty urinating.   Musculoskeletal: Negative for arthralgias.   Skin: Negative for color change and pallor.   Allergic/Immunologic: Negative for immunocompromised state.   Neurological: Negative for dizziness.   Psychiatric/Behavioral: Negative for behavioral problems and confusion.      All 14 ROS has been reviewed and negative except for what has been stated in the HPI.     Past Medical History:   Diagnosis Date   . Acid reflux    . Enlarged prostate    . Erectile dysfunction    . Headache    . Hemorrhoids    . HTN (hypertension)    . Ischemic colitis (CMS [facility])           Past Surgical History:   Procedure Laterality Date   . COLONOSCOPY  04/09/2021   . HX APPENDECTOMY     . HX CARPAL TUNNEL RELEASE Bilateral    . HX HERNIA REPAIR     . HX LUMBAR FUSION     . HX SHOULDER SURGERY Bilateral           Family Medical History:    None          Social History     Socioeconomic History   . Marital status: Married   Tobacco Use   . Smoking status: Light Tobacco Smoker     Types: Cigars   . Smokeless tobacco: Never Used   Vaping Use   . Vaping Use: Never  used   Substance and Sexual Activity   . Alcohol use: Yes     Alcohol/week: 3.0 standard drinks     Types: 3 Cans of beer per week     Comment: daily   . Drug use: Never      Allergies   Allergen Reactions   . Propofol Rash        Current Outpatient Medications:   .  atorvastatin (LIPITOR) 40 mg Oral Tablet, Take 40 mg by mouth Every evening, Disp: , Rfl:   .  azelastine-fluticasone 137-50 mcg/spray Nasal Spray, Non-Aerosol, Administer 2 Sprays into affected nostril(s) Every night, Disp: , Rfl:   .  buPROPion (WELLBUTRIN SR) 150 mg Oral tablet sustained-release 12 hr, Take 300 mg by mouth Once a day, Disp: , Rfl:   .  cetirizine (ZYRTEC) 10 mg Oral Tablet, Take 10 mg by mouth Once a day, Disp: , Rfl:   .  cyanocobalamin (VITAMIN B12) 1,000 mcg/mL Injection Solution, 1,000 mcg Every 30 days, Disp:  , Rfl:   .  cyclobenzaprine (FLEXERIL) 10 mg Oral Tablet, Take 10 mg by mouth Three times a day as needed, Disp: , Rfl:   .  ferrous sulfate (FERATAB) 324 mg (65 mg iron) Oral Tablet, Delayed Release (E.C.), Take 324 mg by mouth Every other day On Monday, Wednesday, friday (Patient not taking: Reported on 04/26/2021), Disp: , Rfl:   .  finasteride (PROSCAR) 5 mg Oral Tablet, Take 5 mg by mouth Once a day, Disp: , Rfl:   .  gabapentin (NEURONTIN) 300 mg Oral Capsule, Take 300 mg by mouth Three times a day (Patient not taking: Reported on 04/26/2021), Disp: , Rfl:   .  HYDROcodone-acetaminophen (NORCO) 5-325 mg Oral Tablet, Take 1 Tablet by mouth Every 8 hours as needed for Pain, Disp: , Rfl:   .  Ibuprofen (MOTRIN) 600 mg Oral Tablet, Take 600 mg by mouth Four times a day as needed for Pain (Patient not taking: Reported on 04/26/2021), Disp: , Rfl:   .  melatonin 3 mg Oral Capsule, Take 6 mg by mouth Every night, Disp: , Rfl:   .  metoprolol tartrate (LOPRESSOR) 25 mg Oral Tablet, Take 25 mg by mouth Once a day, Disp: , Rfl:   .  pantoprazole (PROTONIX) 20 mg Oral Tablet, Delayed Release (E.C.), Take 20 mg by mouth Every morning before breakfast, Disp: , Rfl:   .  simethicone (MYLICON) 80 mg Oral Tablet, Chewable, Chew 80 mg Every 6 hours as needed (Patient not taking: Reported on 04/26/2021), Disp: , Rfl:   .  sumatriptan succinate (IMITREX) 100 mg Oral Tablet, Take 100 mg by mouth Once, as needed for Migraine May repeat in 2 hours in needed, Disp: , Rfl:   .  Tadalafil (CIALIS) 10 mg Oral Tablet, Take 10 mg by mouth Every 24 hours as needed, Disp: , Rfl:   .  Tadalafil (CIALIS) 10 mg Oral Tablet, Take 10 mg by mouth Every 24 hours as needed, Disp: , Rfl:   .  tamsulosin (FLOMAX) 0.4 mg Oral Capsule, Take 0.4 mg by mouth Every evening after dinner, Disp: , Rfl:   .  Testosterone 1 % (25 mg/2.5gram) Transdermal Gel in Packet, Place 2 Each on the skin Once a day 2 pumps each shoulder daily am, Disp: , Rfl:      Objective:    Vitals:    04/26/21 0756   BP: 112/70   Weight: 89 kg (196 lb 3.4 oz)   Height: 1.778  m (5' 10")   BMI: 28.21          Body mass index is 28.15 kg/m.   Physical Exam  Vitals and nursing note reviewed.   Constitutional:       Appearance: Normal appearance.   HENT:      Head: Normocephalic and atraumatic.      Nose: Nose normal.      Mouth/Throat:      Mouth: Mucous membranes are moist.   Eyes:      Extraocular Movements: Extraocular movements intact.      Pupils: Pupils are equal, round, and reactive to light.   Cardiovascular:      Rate and Rhythm: Normal rate and regular rhythm.   Pulmonary:      Effort: Pulmonary effort is normal. No respiratory distress.   Abdominal:      General: Abdomen is flat.      Palpations: Abdomen is soft.   Musculoskeletal:         General: Normal range of motion.      Cervical back: Normal range of motion.   Skin:     General: Skin is warm.   Neurological:      General: No focal deficit present.      Mental Status: He is alert and oriented to person, place, and time. Mental status is at baseline.   Psychiatric:         Mood and Affect: Mood normal.         Behavior: Behavior normal.          Labs/Radiology   CT ABDOMEN PELVIS W IV CONTRAST    Result Date: 04/08/2021  History: Diffuse abdominal pain. CT imaging of the abdomen and pelvis is performed following IV contrast administration. This CT scanner is equipped with dose reducing technology. The mAS is automatically adjusted according to patient size for optimal dose reduction. CT DLP 774. Review of the lung bases shows minimal emphysematous changes with slight atelectasis. No acute infiltrates are seen. Within the abdomen, no acute abnormalities are seen involving the liver, spleen, pancreas, kidneys, and adrenal glands. The gallbladder appears unremarkable. Review of the bowel shows no evidence for obstruction. There is diffuse bowel wall thickening involving the mid to distal lead transverse colon as well as almost the entire  descending colon. There is pericolonic inflammation and stranding with mild thickening along the paracolic gutter. The findings are consistent with a diffuse colitis. There is no perforation. There is a very tiny periumbilical ventral hernia. There are a few scattered colonic diverticula. Within the pelvis, the bladder appears mildly distended but otherwise unremarkable.. No free fluid is seen. Degenerative and postoperative changes are seen within the mid and lower lumbar spine. Prominent accessory ossicle is suspected at the right acetabulum .     1. Diffuse bowel wall thickening involving the distal transverse and entire descending colon with pericolonic inflammation and stranding consistent with mild colitis. There is no obstruction or perforation. Radiologist location ID: [id number]     XR AP MOBILE CHEST    Result Date: 04/09/2021  Male, 65 years old. XR AP MOBILE CHEST performed on 04/09/2021 6:00 AM. REASON FOR EXAM:  colonoscopy FINDINGS: A single frontal view the chest is submitted for interpretation. There are no prior studies available for direct comparison. Lungs are well-inflated. There are mild chronic appearing interstitial changes. No active infiltrate, failure, pneumothorax or significant effusion is seen. There is likely minimal amount of scarring at the left base. Heart size within normal  limits. Bony structures are intact.     Suspect minor scarring or atelectasis at the left base. Radiologist location ID: [id number]     ECG 12 LEAD    Result Date: 04/08/2021  Normal sinus rhythm Normal ECG  REVIEWED BY DR. NOAH CHILDERS Confirmed by Hoover Browns [phone]), editor [place], MEGAN [phone]) on 04/08/2021 10:46:10 AM    Results for orders placed or performed during the hospital encounter of 04/08/21 (from the past 1008 hour(s))   ADULT ROUTINE BLOOD CULTURE, SET OF 2 ADULT BOTTLES (BACTERIA AND YEAST)    Specimen: Blood   Result Value Ref Range    BLOOD CULTURE, ROUTINE No Growth 5 Days    ADULT ROUTINE  BLOOD CULTURE, SET OF 2 ADULT BOTTLES (BACTERIA AND YEAST)    Specimen: Blood   Result Value Ref Range    BLOOD CULTURE, ROUTINE No Growth 5 Days    OVA AND PARASITE SCREEN    Specimen: Stool; Other   Result Value Ref Range    OVA & PARASITE SCREEN TRICHROME       No Ova or Parasite Seen on Concentrate or Trichrome   ROUTINE STOOL CULTURE    Specimen: Stool   Result Value Ref Range    STOOL CULTURE Pseudomonas aeruginosa (A)     STOOL CULTURE No enteric pathogens isolated     GRAM STAIN No WBCs     GRAM STAIN No Giardia lamblia    CBC/DIFF    Narrative    The following orders were created for panel order CBC/DIFF.  Procedure                               Abnormality         Status                     ---------                               -----------         ------                     CBC WITH BPZW[[id number]]                Abnormal            Final result                 Please view results for these tests on the individual orders.   COMPREHENSIVE METABOLIC PANEL, NON-FASTING   Result Value Ref Range    SODIUM 134 (L) 136 - 145 mmol/L    POTASSIUM 4.1 3.5 - 5.1 mmol/L    CHLORIDE 101 96 - 111 mmol/L    CO2 TOTAL 21 (L) 23 - 31 mmol/L    ANION GAP 12 4 - 13 mmol/L    BUN 20 8 - 25 mg/dL    CREATININE 0.98 0.75 - 1.35 mg/dL    BUN/CREA RATIO 20 6 - 22    ESTIMATED GFR 85 >=60 mL/min/BSA    ALBUMIN 3.8 3.4 - 4.8 g/dL     CALCIUM 9.4 8.8 - 10.2 mg/dL    GLUCOSE 104 65 - 125 mg/dL    ALKALINE PHOSPHATASE 63 45 - 115 U/L    ALT (SGPT) 19 10 - 55 U/L    AST (SGOT)  19 8 - 45  U/L    BILIRUBIN TOTAL 0.8 0.3 - 1.3 mg/dL    PROTEIN TOTAL 7.1 6.0 - 8.0 g/dL   LIPASE   Result Value Ref Range    LIPASE 9 (L) 10 - 60 U/L   URINALYSIS, MACROSCOPIC   Result Value Ref Range    COLOR Yellow Colorless, Straw, Yellow    APPEARANCE Clear Clear    SPECIFIC GRAVITY 1.010 >1.005 - <1.030    PH 7.5 >5.0 - <8.0    PROTEIN Not Detected Not Detected mg/dL    GLUCOSE Not Detected Not Detected mg/dL    KETONES 2+ (A) Not Detected mg/dL     UROBILINOGEN 0.2  Not Detected, 0.2  mg/dL    BILIRUBIN Not Detected Not Detected mg/dL    BLOOD Not Detected Not Detected mg/dL    NITRITE Not Detected Not Detected    LEUKOCYTES Not Detected Not Detected WBCs/uL   CBC WITH DIFF   Result Value Ref Range    WBC 9.6 3.7 - 11.0 x10^3/uL    RBC 5.25 4.50 - 6.10 x10^6/uL    HGB 16.3 13.4 - 17.5 g/dL    HCT 48.0 38.9 - 52.0 %    MCV 91.4 78.0 - 100.0 fL    MCH 31.0 26.0 - 32.0 pg    MCHC 34.0 31.0 - 35.5 g/dL    RDW-CV 13.0 11.5 - 15.5 %    PLATELETS 259 150 - 400 x10^3/uL    MPV 9.6 8.7 - 12.5 fL    NEUTROPHIL % 83 %    LYMPHOCYTE % 7 %    MONOCYTE % 10 %    EOSINOPHIL % 0 %    BASOPHIL % 0 %    NEUTROPHIL # 7.92 (H) 1.50 - 7.70 x10^3/uL    LYMPHOCYTE # 0.67 (L) 1.00 - 4.80 x10^3/uL    MONOCYTE # 0.93 0.20 - 1.10 x10^3/uL    EOSINOPHIL # <0.10 <=0.50 x10^3/uL    BASOPHIL # <0.10 <=0.20 x10^3/uL    IMMATURE GRANULOCYTE % 0 0 - 1 %    IMMATURE GRANULOCYTE # <0.10 <0.10 x10^3/uL   TROPONIN-I   Result Value Ref Range    TROPONIN I <7 (L) 7 - 30 ng/L   PT/INR   Result Value Ref Range    PROTHROMBIN TIME 14.4 (H) 9.7 - 13.6 seconds    INR 1.22 <=5.00   PTT (PARTIAL THROMBOPLASTIN TIME)   Result Value Ref Range    APTT 29.1 26.0 - 39.0 seconds   COVID-19, FLU A/B, RSV RAPID BY PCR   Result Value Ref Range    SARS-CoV-2 Not Detected Not Detected    INFLUENZA VIRUS TYPE A Not Detected Not Detected    INFLUENZA VIRUS TYPE B Not Detected Not Detected    RESPIRATORY SYNCTIAL VIRUS (RSV) Not Detected Not Detected    Narrative    Results are for the simultaneous qualitative identification of SARS-CoV-2 (formerly 2019-nCoV), Influenza A, Influenza B, and RSV RNA. These etiologic agents are generally detectable in nasopharyngeal and nasal swabs during the ACUTE PHASE of infection. Hence, this test is intended to be performed on respiratory specimens collected from individuals with signs and symptoms of upper respiratory tract infection who meet Centers for Disease Control and  Prevention (CDC) clinical and/or epidemiological criteria for Coronavirus Disease 2019 (COVID-19) testing. CDC COVID-19 criteria for testing on human specimens is available at [facility] webpage information for Healthcare Professionals: Coronavirus Disease 2019 (COVID-19) ([email or web address]).     False-negative results may occur if the virus  has genomic mutations, insertions, deletions, or rearrangements or if performed very early in the course of illness. Otherwise, negative results indicate virus specific RNA targets are not detected, however negative results do not preclude SARS-CoV-2 infection/COVID-19, Influenza, or Respiratory syncytial virus infection. Results should not be used as the sole basis for patient management decisions. Negative results must be combined with clinical observations, patient history, and epidemiological information. If upper respiratory tract infection is still suspected based on exposure history together with other clinical findings, re-testing should be considered.    Disclaimer:   This assay has been authorized by FDA under an Emergency Use Authorization for use in laboratories certified under the Clinical Laboratory Improvement Amendments of 1988 (CLIA), 42 U.S.C. ([phone], to perform high complexity tests. The impacts of vaccines, antiviral therapeutics, antibiotics, chemotherapeutic or immunosuppressant drugs have not been evaluated.     Test methodology:   Cepheid Xpert Xpress SARS-CoV-2/Flu/RSV Assay real-time polymerase chain reaction (RT-PCR) test on the GeneXpert Dx and Xpert Xpress systems.   LACTIC ACID LEVEL W/ REFLEX FOR LEVEL >2.0   Result Value Ref Range    LACTIC ACID 1.2 0.5 - 2.2 mmol/L   HELICOBACTER PYLORI BREATH TEST   Result Value Ref Range    H. PYLORI,UREA BREATH TEST NOT DETECTED NOT DETECTED   H & H   Result Value Ref Range    HGB 15.2 13.4 - 17.5 g/dL    HCT 45.6 38.9 - 52.0 %   H & H   Result Value Ref Range    HGB 14.8 13.4  - 17.5 g/dL    HCT 44.6 38.9 - 52.0 %   C-REACTIVE PROTEIN(CRP),INFLAMMATION   Result Value Ref Range    CRP INFLAMMATION 142.2 (H) <8.0 mg/L   SEDIMENTATION RATE   Result Value Ref Range    ERYTHROCYTE SEDIMENTATION RATE (ESR) 13 0 - 15 mm/hr   CBC/DIFF    Narrative    The following orders were created for panel order CBC/DIFF.  Procedure                               Abnormality         Status                     ---------                               -----------         ------                     CBC WITH [id number][[id number]]                Abnormal            Final result                 Please view results for these tests on the individual orders.   MAGNESIUM   Result Value Ref Range    MAGNESIUM 2.0 1.8 - 2.6 mg/dL   BASIC METABOLIC PANEL - AM ONCE   Result Value Ref Range    SODIUM 136 136 - 145 mmol/L    POTASSIUM 4.2 3.5 - 5.1 mmol/L    CHLORIDE 106 96 - 111 mmol/L    CO2 TOTAL 22 (L) 23 - 31 mmol/L    ANION GAP 8 4 - 13 mmol/L  CALCIUM 8.0 (L) 8.8 - 10.2 mg/dL    GLUCOSE 90 65 - 125 mg/dL    BUN 12 8 - 25 mg/dL    CREATININE 0.83 0.75 - 1.35 mg/dL    BUN/CREA RATIO 14 6 - 22    ESTIMATED GFR >90 >=60 mL/min/BSA   CBC WITH DIFF   Result Value Ref Range    WBC 9.5 3.7 - 11.0 x10^3/uL    RBC 4.55 4.50 - 6.10 x10^6/uL    HGB 14.2 13.4 - 17.5 g/dL    HCT 43.0 38.9 - 52.0 %    MCV 94.5 78.0 - 100.0 fL    MCH 31.2 26.0 - 32.0 pg    MCHC 33.0 31.0 - 35.5 g/dL    RDW-CV 13.3 11.5 - 15.5 %    PLATELETS 216 150 - 400 x10^3/uL    MPV 9.7 8.7 - 12.5 fL    NEUTROPHIL % 78 %    LYMPHOCYTE % 10 %    MONOCYTE % 10 %    EOSINOPHIL % 2 %    BASOPHIL % 0 %    NEUTROPHIL # 7.40 1.50 - 7.70 x10^3/uL    LYMPHOCYTE # 0.97 (L) 1.00 - 4.80 x10^3/uL    MONOCYTE # 0.90 0.20 - 1.10 x10^3/uL    EOSINOPHIL # 0.16 <=0.50 x10^3/uL    BASOPHIL # <0.10 <=0.20 x10^3/uL    IMMATURE GRANULOCYTE % 0 0 - 1 %    IMMATURE GRANULOCYTE # <0.10 <0.10 x10^3/uL   H & H   Result Value Ref Range    HGB 14.1 13.4 - 17.5 g/dL    HCT 43.1 38.9 - 52.0 %   E.  COLI SHIGA TOXIN   Result Value Ref Range    SHIGA TOXIN 1 Negative Negative    SHIGA TOXIN 2 Negative Negative   LACTIC ACID LEVEL W/ REFLEX FOR LEVEL >2.0   Result Value Ref Range    LACTIC ACID 1.3 0.5 - 2.2 mmol/L   CBC/DIFF    Narrative    The following orders were created for panel order CBC/DIFF.  Procedure                               Abnormality         Status                     ---------                               -----------         ------                     CBC WITH [id number]]                Abnormal            Final result                 Please view results for these tests on the individual orders.   MAGNESIUM   Result Value Ref Range    MAGNESIUM 1.9 1.8 - 2.6 mg/dL    Narrative    Hemolysis can alter results at this level (slight).   COMPREHENSIVE METABOLIC PANEL, NON-FASTING   Result Value Ref Range    SODIUM 138 136 - 145 mmol/L    POTASSIUM 4.3 3.5 - 5.1  mmol/L    CHLORIDE 108 96 - 111 mmol/L    CO2 TOTAL 22 (L) 23 - 31 mmol/L    ANION GAP 8 4 - 13 mmol/L    BUN 14 8 - 25 mg/dL    CREATININE 0.92 0.75 - 1.35 mg/dL    BUN/CREA RATIO 15 6 - 22    ESTIMATED GFR >90 >=60 mL/min/BSA    ALBUMIN 2.9 (L) 3.4 - 4.8 g/dL     CALCIUM 7.9 (L) 8.8 - 10.2 mg/dL    GLUCOSE 89 65 - 125 mg/dL    ALKALINE PHOSPHATASE 50 45 - 115 U/L    ALT (SGPT) 24 10 - 55 U/L    AST (SGOT)  24 8 - 45 U/L    BILIRUBIN TOTAL 0.5 0.3 - 1.3 mg/dL    PROTEIN TOTAL 5.2 (L) 6.0 - 8.0 g/dL    Narrative    Hemolysis can alter results at this level (slight).  Hemolysis can alter results at this level (slight).   CBC WITH DIFF   Result Value Ref Range    WBC 6.7 3.7 - 11.0 x10^3/uL    RBC 4.35 (L) 4.50 - 6.10 x10^6/uL    HGB 13.4 13.4 - 17.5 g/dL    HCT 42.0 38.9 - 52.0 %    MCV 96.6 78.0 - 100.0 fL    MCH 30.8 26.0 - 32.0 pg    MCHC 31.9 31.0 - 35.5 g/dL    RDW-CV 13.2 11.5 - 15.5 %    PLATELETS 205 150 - 400 x10^3/uL    MPV 9.8 8.7 - 12.5 fL    NEUTROPHIL % 65 %    LYMPHOCYTE % 20 %    MONOCYTE % 10 %    EOSINOPHIL % 5 %     BASOPHIL % 0 %    NEUTROPHIL # 4.35 1.50 - 7.70 x10^3/uL    LYMPHOCYTE # 1.33 1.00 - 4.80 x10^3/uL    MONOCYTE # 0.64 0.20 - 1.10 x10^3/uL    EOSINOPHIL # 0.35 <=0.50 x10^3/uL    BASOPHIL # <0.10 <=0.20 x10^3/uL    IMMATURE GRANULOCYTE % 0 0 - 1 %    IMMATURE GRANULOCYTE # <0.10 <0.10 x10^3/uL   CBC/DIFF    Narrative    The following orders were created for panel order CBC/DIFF.  Procedure                               Abnormality         Status                     ---------                               -----------         ------                     CBC WITH SWNI[[id number]]                Abnormal            Final result                 Please view results for these tests on the individual orders.   MAGNESIUM   Result Value Ref Range    MAGNESIUM 1.7 (L) 1.8 - 2.6 mg/dL   COMPREHENSIVE METABOLIC PANEL,  NON-FASTING   Result Value Ref Range    SODIUM 139 136 - 145 mmol/L    POTASSIUM 4.0 3.5 - 5.1 mmol/L    CHLORIDE 108 96 - 111 mmol/L    CO2 TOTAL 23 23 - 31 mmol/L    ANION GAP 8 4 - 13 mmol/L    BUN 9 8 - 25 mg/dL    CREATININE 0.90 0.75 - 1.35 mg/dL    BUN/CREA RATIO 10 6 - 22    ESTIMATED GFR >90 >=60 mL/min/BSA    ALBUMIN 3.0 (L) 3.4 - 4.8 g/dL     CALCIUM 8.3 (L) 8.8 - 10.2 mg/dL    GLUCOSE 99 65 - 125 mg/dL    ALKALINE PHOSPHATASE 46 45 - 115 U/L    ALT (SGPT) 23 10 - 55 U/L    AST (SGOT)  17 8 - 45 U/L    BILIRUBIN TOTAL 0.5 0.3 - 1.3 mg/dL    PROTEIN TOTAL 5.3 (L) 6.0 - 8.0 g/dL   CBC WITH DIFF   Result Value Ref Range    WBC 5.9 3.7 - 11.0 x10^3/uL    RBC 4.43 (L) 4.50 - 6.10 x10^6/uL    HGB 13.7 13.4 - 17.5 g/dL    HCT 41.5 38.9 - 52.0 %    MCV 93.7 78.0 - 100.0 fL    MCH 30.9 26.0 - 32.0 pg    MCHC 33.0 31.0 - 35.5 g/dL    RDW-CV 12.8 11.5 - 15.5 %    PLATELETS 210 150 - 400 x10^3/uL    MPV 9.5 8.7 - 12.5 fL    NEUTROPHIL % 57 %    LYMPHOCYTE % 24 %    MONOCYTE % 10 %    EOSINOPHIL % 8 %    BASOPHIL % 0 %    NEUTROPHIL # 3.43 1.50 - 7.70 x10^3/uL    LYMPHOCYTE # 1.38 1.00 - 4.80 x10^3/uL    MONOCYTE #  0.56 0.20 - 1.10 x10^3/uL    EOSINOPHIL # 0.44 <=0.50 x10^3/uL    BASOPHIL # <0.10 <=0.20 x10^3/uL    IMMATURE GRANULOCYTE % 1 0 - 1 %    IMMATURE GRANULOCYTE # <0.10 <0.10 x10^3/uL   CBC/DIFF    Narrative    The following orders were created for panel order CBC/DIFF.  Procedure                               Abnormality         Status                     ---------                               -----------         ------                     CBC WITH IOEV[[id number]]                                    Final result                 Please view results for these tests on the individual orders.   MAGNESIUM   Result Value Ref Range    MAGNESIUM 1.9 1.8 - 2.6  mg/dL   COMPREHENSIVE METABOLIC PANEL, NON-FASTING   Result Value Ref Range    SODIUM 138 136 - 145 mmol/L    POTASSIUM 4.4 3.5 - 5.1 mmol/L    CHLORIDE 106 96 - 111 mmol/L    CO2 TOTAL 25 23 - 31 mmol/L    ANION GAP 7 4 - 13 mmol/L    BUN 11 8 - 25 mg/dL    CREATININE 1.08 0.75 - 1.35 mg/dL    BUN/CREA RATIO 10 6 - 22    ESTIMATED GFR 75 >=60 mL/min/BSA    ALBUMIN 3.1 (L) 3.4 - 4.8 g/dL     CALCIUM 8.6 (L) 8.8 - 10.2 mg/dL    GLUCOSE 93 65 - 125 mg/dL    ALKALINE PHOSPHATASE 51 45 - 115 U/L    ALT (SGPT) 45 10 - 55 U/L    AST (SGOT)  40 8 - 45 U/L    BILIRUBIN TOTAL 0.4 0.3 - 1.3 mg/dL    PROTEIN TOTAL 5.5 (L) 6.0 - 8.0 g/dL   CBC WITH DIFF   Result Value Ref Range    WBC 5.1 3.7 - 11.0 x10^3/uL    RBC 4.65 4.50 - 6.10 x10^6/uL    HGB 14.2 13.4 - 17.5 g/dL    HCT 43.9 38.9 - 52.0 %    MCV 94.4 78.0 - 100.0 fL    MCH 30.5 26.0 - 32.0 pg    MCHC 32.3 31.0 - 35.5 g/dL    RDW-CV 12.7 11.5 - 15.5 %    PLATELETS 235 150 - 400 x10^3/uL    MPV 9.6 8.7 - 12.5 fL    NEUTROPHIL % 52 %    LYMPHOCYTE % 27 %    MONOCYTE % 11 %    EOSINOPHIL % 9 %    BASOPHIL % 0 %    NEUTROPHIL # 2.70 1.50 - 7.70 x10^3/uL    LYMPHOCYTE # 1.36 1.00 - 4.80 x10^3/uL    MONOCYTE # 0.56 0.20 - 1.10 x10^3/uL    EOSINOPHIL # 0.47 <=0.50 x10^3/uL    BASOPHIL # <0.10 <=0.20 x10^3/uL    IMMATURE GRANULOCYTE %  1 0 - 1 %    IMMATURE GRANULOCYTE # <0.10 <0.10 x10^3/uL   TYPE AND SCREEN   Result Value Ref Range    UNITS ORDERED NOT STATED         ABO/RH(D) A POSITIVE     ANTIBODY SCREEN NEGATIVE     SPECIMEN EXPIRATION DATE 04/11/2021,2359    SURGICAL PATHOLOGY SPECIMEN   Result Value Ref Range    Final Diagnosis       TRANSVERSE COLON, BIOPSIES:   MARKED ISCHEMIC-TYPE COLITIS;   NO EVIDENCE OF MALIGNANCY.        Gross Description       A. Colon Transverse.  Formalin, [employer], transverse biopsies.  Present are three 0.3 cm tan tissue fragments, which are inked and embedded.          Assessment:      This is a 65 year old man past medical history of GERD, IBS, hypertension, migraine, depression who was seen as inpatient for rectal bleeding 2 weeks ago.  He comes today for follow-up after discharge.  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  Pathology was consistent with ischemic colitis.  Since his discharge he has been feeling well with no new complaints.  He has a baseline of IBS-D.  Antithrombotics/Anticoagulants:  None  Relevant Surgical History:  None  Cardio-Pulm History/Clearance: None      Plan:     1. Ischemic colitis  Resolving  Feeling well  Continue to hold Adderall    2. IBS - D  Low FODMAP diet  Imodium as needed    3. CRC Screening:  Colonoscopy 03/2021 - no polyps  Ischemic colitis  Repeat colonoscopy in 1 year    4. HCV Screening:  USPTF recommends one time screening for HCV in adults age 20-79  To be done by PCP    Follow up as needed            ICD-10-CM    1. Irritable bowel syndrome with diarrhea  K58.0 dicyclomine (BENTYL) 10 mg Oral Capsule   2. Ischemic colitis (CMS [place])  K55.9

## 2021-05-03 ENCOUNTER — Encounter (INDEPENDENT_AMBULATORY_CARE_PROVIDER_SITE_OTHER): Payer: Self-pay | Admitting: Family

## 2021-05-03 ENCOUNTER — Other Ambulatory Visit: Payer: Self-pay

## 2021-05-03 ENCOUNTER — Ambulatory Visit (INDEPENDENT_AMBULATORY_CARE_PROVIDER_SITE_OTHER): Payer: 59 | Admitting: Family

## 2021-05-03 DIAGNOSIS — K529 Noninfective gastroenteritis and colitis, unspecified: Secondary | ICD-10-CM

## 2021-05-03 NOTE — Progress Notes (Cosign Needed)
[place]  [place] [id number]    Name: Sean Wall MRN:  [id number]   Date: 05/03/2021 Age: 65 y.o.     Referring Provider: Meta Hatchet    Chief Complaint: Hospital Follow Up (ISCHEMIC COLITIS)    History of Present Illness:   I had the pleasure of seeing Sean Wall today as a new patient with referral following hospital discharge. Patient had recent admission in August with acute abdominal pain. He has had some chronic abdominal issues in the past with constipation as well as diverticulitis. He presented to the emergency department with complaints of abdominal pain and constipation for several days which had been worsening. He then noticed some bright red bleeding. He had a colonoscopy during his hospitalization which was suggestive of ischemic colitis in the transverse and descending colon. His lactic acid levels were normal. He continued antibiotic therapy and symptoms did resolve. Dr. Shawnie Pons had recommended discontinuation of his Adderall as it is a known cause for ischemic colitis. However, patient reports that he did not take Adderall that frequently.    Patient has had improvement since discharge. He does still have some intermittent discomfort. He reports some postprandial pain. For the last few days he has been doing a bland diet. He denies any history of blood clots. He denies any new GI bleeding or evidence for blood in the stool.    Past Surgical History:   Procedure Laterality Date   . COLONOSCOPY  04/09/2021   . HX APPENDECTOMY     . HX CARPAL TUNNEL RELEASE Bilateral    . HX HERNIA REPAIR     . HX LUMBAR FUSION     . HX SHOULDER SURGERY Bilateral          Allergies   Allergen Reactions   . Propofol Rash     Patient Active Problem List    Diagnosis   . Ischemic colitis (CMS [place])   . Irritable bowel syndrome with diarrhea   . Tobacco use disorder   . Colitis   . Hematochezia   . Hypertension   . GERD  (gastroesophageal reflux disease)   . BPH (benign prostatic hyperplasia)   . Family history of migraine headaches   . Depression     Past Medical History:   Diagnosis Date   . Acid reflux    . Enlarged prostate    . Erectile dysfunction    . Headache    . Hemorrhoids    . HTN (hypertension)    . Ischemic colitis (CMS [place])          Social History     Tobacco Use   . Smoking status: Light Tobacco Smoker     Types: Cigars   . Smokeless tobacco: Never Used   . Tobacco comment: OCCASIONAL SMOKING-1 CIGAR EVERY 2 WEEKS   Substance Use Topics   . Alcohol use: Yes     Alcohol/week: 21.0 standard drinks     Types: 21 Cans of beer per week     Comment: 3/DAY-NONE SINCE 05/03/2021      Current Outpatient Medications   Medication Sig   . atorvastatin (LIPITOR) 40 mg Oral Tablet Take 40 mg by mouth Every evening   . azelastine-fluticasone 137-50 mcg/spray Nasal Spray, Non-Aerosol Administer 2 Sprays into affected nostril(s) Every night (Patient not taking: Reported on 05/03/2021)   . buPROPion (WELLBUTRIN SR) 150 mg Oral tablet sustained-release 12 hr Take 300  mg by mouth Once a day   . cetirizine (ZYRTEC) 10 mg Oral Tablet Take 10 mg by mouth Once a day (Patient not taking: Reported on 05/03/2021)   . cyanocobalamin (VITAMIN B12) 1,000 mcg/mL Injection Solution 1,000 mcg Every 30 days   . cyclobenzaprine (FLEXERIL) 10 mg Oral Tablet Take 10 mg by mouth Three times a day as needed (Patient not taking: Reported on 05/03/2021)   . dicyclomine (BENTYL) 10 mg Oral Capsule Take 1 Capsule (10 mg total) by mouth Four times a day as needed for up to 90 days (Patient not taking: Reported on 05/03/2021)   . ferrous sulfate (FERATAB) 324 mg (65 mg iron) Oral Tablet, Delayed Release (E.C.) Take 324 mg by mouth Every other day On Monday, Wednesday, friday (Patient not taking: No sig reported)   . finasteride (PROSCAR) 5 mg Oral Tablet Take 5 mg by mouth Once a day   . gabapentin (NEURONTIN) 300 mg Oral Capsule Take 300 mg by mouth Three times a  day (Patient not taking: No sig reported)   . HERBAL DRUGS ORAL Take 700 mg by mouth Three times a day PEPPERMINT LEAF   . HYDROcodone-acetaminophen (NORCO) 5-325 mg Oral Tablet Take 1 Tablet by mouth Every 8 hours as needed for Pain (Patient not taking: Reported on 05/03/2021)   . Ibuprofen (MOTRIN) 600 mg Oral Tablet Take 600 mg by mouth Four times a day as needed for Pain (Patient not taking: No sig reported)   . melatonin 3 mg Oral Capsule Take 6 mg by mouth Every night (Patient not taking: Reported on 05/03/2021)   . metoprolol tartrate (LOPRESSOR) 25 mg Oral Tablet Take 25 mg by mouth Once a day   . pantoprazole (PROTONIX) 20 mg Oral Tablet, Delayed Release (E.C.) Take 20 mg by mouth Every morning before breakfast   . simethicone (MYLICON) 80 mg Oral Tablet, Chewable Chew 80 mg Every 6 hours as needed (Patient not taking: No sig reported)   . sumatriptan succinate (IMITREX) 100 mg Oral Tablet Take 100 mg by mouth Once, as needed for Migraine May repeat in 2 hours in needed   . Tadalafil (CIALIS) 10 mg Oral Tablet Take 10 mg by mouth Every 24 hours as needed   . Tadalafil (CIALIS) 10 mg Oral Tablet Take 10 mg by mouth Every 24 hours as needed   . tamsulosin (FLOMAX) 0.4 mg Oral Capsule Take 0.4 mg by mouth Every evening after dinner   . Testosterone 1 % (25 mg/2.5gram) Transdermal Gel in Packet Place 2 Each on the skin Once a day 2 pumps each shoulder daily am (Patient not taking: Reported on 05/03/2021)     Family Medical History:    None         Review of Systems:  As per HPI.    Physical Examination:  BP 110/70 (Site: Manual, Patient Position: Sitting, Cuff Size: Adult) Comment: RIGHT ARM  Ht 1.778 m ('5\' 10"'$ )   Wt 84.4 kg (186 lb)   BMI 26.69 kg/m   GENERAL: The patient is alert and oriented, does not appear to be in any acute distress.  HEENT: Pupils are equal, round and reactive. Extraocular movements are intact.  NEUROLOGICALLY: Cranial nerves II-XII are grossly intact. No motor sensory deficits.  HEART:  Regular rate and rhythm. No murmurs present.  LUNGS: Clear to auscultation bilaterally.  ABDOMEN: Soft, nontender and nondistended.   NECK: No bruits noted over the carotids.  EXTREMITIES: Warm and well perfused.    DIAGNOSIS:  Problem  List Items Addressed This Visit        Digestive    Colitis    Relevant Orders    CT ANGIO ABDOMEN PELVIS W/WO IV CONTRAST        ASSESSMENT & PLAN:  Given concern for ischemic colitis, we will do further work up to rule out any mesenteric stenosis. I will order a CTA of the abdomen and pelvis and call him following with results and further recommendations. This is unlikely the etiology for patient's ischemic colitis, but I certainly feel appropriate to rule out. In the meantime, the patient understands to call with any questions or concerns, or with any development of new or worsening symptoms.       Alexa Blish, APRN,FNP-BC      I am scribing for, and in the presence of, Leniya Breit, APRN, FNP-BC, for services provided on 05/03/2021.  Terri L. Hertz     I personally performed the services described in this documentation, as scribed  in my presence, and it is both accurate  and complete.    Kennedi Lizardo, APRN,FNP-BC    On the day of the encounter, a total of  45 minutes was spent on this patient encounter including review of historical information, examination, documentation and post-visit activities.

## 2021-05-10 ENCOUNTER — Ambulatory Visit (INDEPENDENT_AMBULATORY_CARE_PROVIDER_SITE_OTHER): Payer: Self-pay | Admitting: Family

## 2021-05-12 ENCOUNTER — Inpatient Hospital Stay
Admission: RE | Admit: 2021-05-12 | Discharge: 2021-05-12 | Disposition: A | Discharge status: Home / Self Care | Payer: 59 | Source: Ambulatory Visit | Attending: Family | Admitting: Family

## 2021-05-12 ENCOUNTER — Other Ambulatory Visit: Payer: Self-pay

## 2021-05-12 DIAGNOSIS — K529 Noninfective gastroenteritis and colitis, unspecified: Secondary | ICD-10-CM | POA: Insufficient documentation

## 2021-05-12 MED ORDER — IOPAMIDOL 370 MG IODINE/ML (76 %) INTRAVENOUS SOLUTION
100.0000 mL | INTRAVENOUS | Status: AC
Start: 2021-05-12 — End: 2021-05-12
  Administered 2021-05-12: 100 mL via INTRAVENOUS

## 2021-05-14 ENCOUNTER — Telehealth (INDEPENDENT_AMBULATORY_CARE_PROVIDER_SITE_OTHER): Payer: Self-pay | Admitting: Family

## 2021-05-14 NOTE — Telephone Encounter (Signed)
Called patient to review CTA. No significant SMA stenosis noted. Vascular etiology unlikely cause of patient's episode of ischemic colitis. Recommend continued follow-up with PCP and GI. We will see back on a PRN basis.

## 2021-07-13 ENCOUNTER — Ambulatory Visit (INDEPENDENT_AMBULATORY_CARE_PROVIDER_SITE_OTHER): Payer: Self-pay | Admitting: Specialist

## 2021-08-11 ENCOUNTER — Ambulatory Visit (INDEPENDENT_AMBULATORY_CARE_PROVIDER_SITE_OTHER): Payer: Self-pay | Admitting: Specialist

## 2021-09-02 ENCOUNTER — Encounter (INDEPENDENT_AMBULATORY_CARE_PROVIDER_SITE_OTHER): Payer: Self-pay | Admitting: Urology

## 2021-09-02 ENCOUNTER — Other Ambulatory Visit: Payer: Self-pay

## 2021-09-02 ENCOUNTER — Ambulatory Visit: Payer: 59 | Attending: Urology | Admitting: Specialist

## 2021-09-02 VITALS — BP 122/80 | HR 78 | Temp 96.8°F | Ht 70.0 in | Wt 207.9 lb

## 2021-09-02 DIAGNOSIS — N401 Enlarged prostate with lower urinary tract symptoms: Secondary | ICD-10-CM

## 2021-09-02 DIAGNOSIS — N529 Male erectile dysfunction, unspecified: Secondary | ICD-10-CM | POA: Insufficient documentation

## 2021-09-02 DIAGNOSIS — N5314 Retrograde ejaculation: Secondary | ICD-10-CM

## 2021-09-02 NOTE — Progress Notes (Signed)
NAME :Sean Wall  AGE: 66 y.o.  DATE: 09/02/2021  SERVICE: Urology        Chief Complaint   Patient presents with   . Other     Eval for Ejaculatory dysfunction        HPI:  This is a 66 y.o. male here for evaluation of ejaculatory dysfunction.  He gives a history of TURP in 2005 or 2006.  He states that this was a formal resection but he continue to have normal ejaculation.  He then began having some difficulties with ejaculation around 2016.  He now has rare ejaculations or orgasm.  He does describe some recent leakage of urine with climax.  He describes good quality erections.  He does relate that he was started on finasteride and tamsulosin about 2 years ago primarily for nocturia.  He remains on both these medications.  He is satisfied with his current voiding status.  He denies hematuria or dysuria.        Past Medical History  Past Medical History:   Diagnosis Date   . Acid reflux    . Enlarged prostate    . Erectile dysfunction    . Headache    . Hemorrhoids    . HTN (hypertension)    . Ischemic colitis (CMS [facility])            Past Surgical History  Past Surgical History:   Procedure Laterality Date   . COLONOSCOPY  04/09/2021   . HX APPENDECTOMY     . HX CARPAL TUNNEL RELEASE Bilateral    . HX HERNIA REPAIR     . HX LUMBAR FUSION     . HX SHOULDER SURGERY Bilateral              Allergies  Allergies   Allergen Reactions   . Propofol Rash         Medications  atorvastatin (LIPITOR) 40 mg Oral Tablet, Take 40 mg by mouth Every evening  azelastine-fluticasone 137-50 mcg/spray Nasal Spray, Non-Aerosol, Administer 2 Sprays into affected nostril(s) Every night (Patient not taking: Reported on 05/03/2021)  buPROPion (WELLBUTRIN SR) 150 mg Oral tablet sustained-release 12 hr, Take 300 mg by mouth Once a day  cetirizine (ZYRTEC) 10 mg Oral Tablet, Take 10 mg by mouth Once a day (Patient not taking: Reported on 05/03/2021)  cyanocobalamin (VITAMIN B12) 1,000 mcg/mL Injection Solution, 1,000 mcg Every 30  days  cyclobenzaprine (FLEXERIL) 10 mg Oral Tablet, Take 10 mg by mouth Three times a day as needed (Patient not taking: Reported on 05/03/2021)  ferrous sulfate (FERATAB) 324 mg (65 mg iron) Oral Tablet, Delayed Release (E.C.), Take 324 mg by mouth Every other day On Monday, Wednesday, friday (Patient not taking: No sig reported)  finasteride (PROSCAR) 5 mg Oral Tablet, Take 5 mg by mouth Once a day  gabapentin (NEURONTIN) 300 mg Oral Capsule, Take 300 mg by mouth Three times a day (Patient not taking: No sig reported)  HERBAL DRUGS ORAL, Take 700 mg by mouth Three times a day PEPPERMINT LEAF  HYDROcodone-acetaminophen (NORCO) 5-325 mg Oral Tablet, Take 1 Tablet by mouth Every 8 hours as needed for Pain (Patient not taking: Reported on 05/03/2021)  Ibuprofen (MOTRIN) 600 mg Oral Tablet, Take 600 mg by mouth Four times a day as needed for Pain (Patient not taking: No sig reported)  melatonin 3 mg Oral Capsule, Take 6 mg by mouth Every night (Patient not taking: Reported on 05/03/2021)  metoprolol tartrate (LOPRESSOR) 25 mg Oral  Tablet, Take 25 mg by mouth Once a day  pantoprazole (PROTONIX) 20 mg Oral Tablet, Delayed Release (E.C.), Take 20 mg by mouth Every morning before breakfast  simethicone (MYLICON) 80 mg Oral Tablet, Chewable, Chew 80 mg Every 6 hours as needed (Patient not taking: No sig reported)  sumatriptan succinate (IMITREX) 100 mg Oral Tablet, Take 100 mg by mouth Once, as needed for Migraine May repeat in 2 hours in needed  Tadalafil (CIALIS) 10 mg Oral Tablet, Take 10 mg by mouth Every 24 hours as needed  Tadalafil (CIALIS) 10 mg Oral Tablet, Take 10 mg by mouth Every 24 hours as needed  tamsulosin (FLOMAX) 0.4 mg Oral Capsule, Take 0.4 mg by mouth Every evening after dinner  Testosterone 1 % (25 mg/2.5gram) Transdermal Gel in Packet, Place 2 Each on the skin Once a day 2 pumps each shoulder daily am (Patient not taking: Reported on 05/03/2021)    No facility-administered medications prior to  visit.      Past Social History  He is a English as a second language teacher.  He smokes cigars.  No illicit drug use.    Family History   Neg for GU malignancy      ROS:  Please see HPI.  All other systems were reviewed by me and are negative.                OBJECTIVE:  PHYSICAL EXAM:    BP 122/80   Pulse 78   Temp 36 C (96.8 F) (Thermal Scan)   Ht 1.778 m (5\' 10" )   Wt 94.3 kg (207 lb 14.3 oz)   SpO2 95% Comment: room air  BMI 29.83 kg/m       General:  NAD.  Vital signs reviewed.  Skin:  Warm, dry, and without suspicious lesion.    HEENT:  Head is normocephalic.  Hearing adequate for conversation  Pulmonary:  Respiration unlabored.  No audible wheeze.     Cardiovascular:  No JVD.    MS:  Ambulates without difficulty.  No CVAT.   GI: The abdomen is soft, nontender and nondistended. No palpable hernia.   GU:  Phallus is unremarkable.  Urinary meatus is patent.  Scrotum is unremarkable.  Testicles are descended bilaterally.  They are non-tender and without suspicious nodules.  Cord structures and epididymis are unremarkable.  Perineum unremarkable,  Digital rectal exam reveals normal rectal sphincter tone.  The prostate is moderate in size.  It is soft, non-tender and without suspicious nodules.  Seminal vesicles are non-palpable.      LABS:  He reports that his PSAs are checked through the [facility].  04/12/2021-cr 1.08  04/12/2021-WBC 5.1, Hgb 14.2, plt 235      Urine Dip Results:         DIAGNOSTIC STUDIES:  PATIENT NAME: Sean Wall, Sean Wall MED REC NO: [id number]   BIRTH DATE: 06-11-1956 ORDER: [id number]   SEX: M ORDER FROM: [facility]   REQUESTING PHYSHarriet Butte SERVICE DATE: 05/12/2021 10:31 AM    ATTENDING Oris DroneLenell Antu, Beth REPORT DATE: 05/12/2021 11:10 AM  05/21/2021  7:31 AM   REASON: Colitis     Final  CT ANGIO ABDOMEN PELVIS W/WO IV CONTRAST                      Addendum    ADDENDUM:    Multiplanar and 3-D reconstructions are submitted with this exam.      Radiologist location ID: [id number]      Addended by  Joesph Fillers, MD on 05/21/2021 7:31 AM     Study Result    Narrative & Impression   Male, 66 years old.    CT ANGIO ABDOMEN PELVIS W/WO IV CONTRAST performed on 05/12/2021 10:31 AM.    REASON FOR EXAM:  K52.9: Colitis  RADIATION DOSE: 1064.28 (mGy*cm)  CONTRAST: 100 ml's of Isovue 370    TECHNIQUE: Enhanced 5 mm transaxial and sagittal coronal reconstruction imaging performed through the abdomen and pelvis   The CT scanner is equipped with dose reducing technology. The MAS is automatically adjusted to the patient body size in order to deliver the lowest dose possible.    COMPARISON: 04/08/2021    FINDINGS: The abdominal aorta shows normal contrast enhancement. No significant atheromatous changes. There is a small calcification at origin of celiac axis. The superior mesenteric artery widely patent. No atheromatous change is detected in the superior mesenteric artery. The inferior mesenteric artery is widely patent.    The common iliac arteries, internal and external iliac arteries are patent. The common femoral arteries and proximal superficial femoral arteries and profundus femoral arteries are widely patent.    Images lower lungs are clear.    The liver and spleen appear unremarkable. Adrenal glands and pancreas and gallbladder appear unremarkable. The kidneys show symmetric functioning.    Postsurgical changes are noted in the spine. The bladder appears unremarkable.    The bowel wall thickening in the colon has resolved. No acute intra-abdominal process is seen on today's study.    IMPRESSION:  1. No stenosis is seen in the superior mesenteric artery or inferior mesenteric artery.  2. No aortoiliac occlusive disease is seen.  3. 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 Question clinical significance.  4. Interval resolution of bowel wall thickening in the colon.      Radiologist location ID: [id number]    View Report   View Report     This result has been Addended.  See Addendum at the top of this result.              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 Retrograde ejaculation  3. climacturia  4. ED        PLAN:  He will try stopping the Flomax 1st.  He will continue finasteride.  If his voiding symptoms worsen we will switch him to Uroxatral 10 mg daily.  He is to follow-up in 3 months with Dr. Annamarie Dawley.  He will continue with Cialis.    No orders of the defined types were placed in this encounter.      Dossie Arbour, APRN,FNP-BC  09/02/2021, 15:40    Dossie Arbour, [facility]  Department of Urology  [phone]  Fax- [phone]

## 2021-09-03 ENCOUNTER — Telehealth: Payer: Self-pay | Admitting: Internal Medicine

## 2021-09-07 NOTE — Telephone Encounter (Signed)
[facility] calling in to let us know that a PA has been started for medication tadalafil (CIALIS) 5 MG tablet

## 2021-09-09 NOTE — Telephone Encounter (Signed)
PA submitted vis cover-my-meds w/ (Key: [id number]). PA sent to [facility] waiting on insurance determination...[name] [name]

## 2021-09-10 NOTE — Telephone Encounter (Signed)
Rec'd determination back med was denied . It states " We cover this drug when our criteria are met. The unmet criteria are: has tried or cannot use both an alpha blocker (for example: doxazosin, terazosin, alfuzosin, tamsulosin) and a 5-alpha reductase inhibitor (for example: finasteride, dutasteride). "Will try PA w/dx ED.Marland KitchenJohny Chess

## 2021-09-13 NOTE — Telephone Encounter (Signed)
Noted.  The patient has not been seen since 10/29/2019.  He needs to have an appointment.  Thanks

## 2021-11-12 ENCOUNTER — Ambulatory Visit: Payer: 59 | Attending: Family | Admitting: Family

## 2021-11-12 ENCOUNTER — Inpatient Hospital Stay (HOSPITAL_BASED_OUTPATIENT_CLINIC_OR_DEPARTMENT_OTHER)
Admission: RE | Admit: 2021-11-12 | Discharge: 2021-11-12 | Disposition: A | Discharge status: Home / Self Care | Payer: 59 | Source: Ambulatory Visit | Attending: Family | Admitting: Family

## 2021-11-12 ENCOUNTER — Other Ambulatory Visit (HOSPITAL_BASED_OUTPATIENT_CLINIC_OR_DEPARTMENT_OTHER): Payer: 59

## 2021-11-12 ENCOUNTER — Encounter (HOSPITAL_BASED_OUTPATIENT_CLINIC_OR_DEPARTMENT_OTHER): Payer: Self-pay | Admitting: Family

## 2021-11-12 ENCOUNTER — Other Ambulatory Visit: Payer: Self-pay

## 2021-11-12 VITALS — BP 106/78 | HR 60 | Ht 70.0 in | Wt 211.2 lb

## 2021-11-12 DIAGNOSIS — R109 Unspecified abdominal pain: Secondary | ICD-10-CM

## 2021-11-12 DIAGNOSIS — R194 Change in bowel habit: Secondary | ICD-10-CM

## 2021-11-12 DIAGNOSIS — Z8719 Personal history of other diseases of the digestive system: Secondary | ICD-10-CM

## 2021-11-12 DIAGNOSIS — Z79899 Other long term (current) drug therapy: Secondary | ICD-10-CM

## 2021-11-12 LAB — COMPREHENSIVE METABOLIC PANEL, NON-FASTING
ALBUMIN: 3.9 g/dL (ref 3.4–4.8)
ALKALINE PHOSPHATASE: 65 U/L (ref 45–115)
ALT (SGPT): 23 U/L (ref 10–55)
ANION GAP: 6 mmol/L (ref 4–13)
AST (SGOT): 15 U/L (ref 8–45)
BILIRUBIN TOTAL: 0.4 mg/dL (ref 0.3–1.3)
BUN/CREA RATIO: 11 (ref 6–22)
BUN: 12 mg/dL (ref 8–25)
CALCIUM: 8.9 mg/dL (ref 8.8–10.2)
CHLORIDE: 104 mmol/L (ref 96–111)
CO2 TOTAL: 28 mmol/L (ref 23–31)
CREATININE: 1.08 mg/dL (ref 0.75–1.35)
ESTIMATED GFR: 75 mL/min/BSA (ref >=60)
GLUCOSE: 121 mg/dL (ref 65–125)
POTASSIUM: 4.1 mmol/L (ref 3.5–5.1)
PROTEIN TOTAL: 6.5 g/dL (ref 6.0–8.0)
SODIUM: 138 mmol/L (ref 136–145)

## 2021-11-12 LAB — CBC WITH DIFF
BASOPHIL #: <0.1 10*3/uL (ref <=0.20)
BASOPHIL %: 0 %
EOSINOPHIL #: 0.29 10*3/uL (ref <=0.50)
EOSINOPHIL %: 6 %
HCT: 47 % (ref 38.9–52.0)
HGB: 15.6 g/dL (ref 13.4–17.5)
IMMATURE GRANULOCYTE #: <0.1 10*3/uL (ref <0.10)
IMMATURE GRANULOCYTE %: 1 % (ref 0–1)
LYMPHOCYTE #: 1.48 10*3/uL (ref 1.00–4.80)
LYMPHOCYTE %: 30 %
MCH: 31.2 pg (ref 26.0–32.0)
MCHC: 33.2 g/dL (ref 31.0–35.5)
MCV: 94 fL (ref 78.0–100.0)
MONOCYTE #: 0.56 10*3/uL (ref 0.20–1.10)
MONOCYTE %: 11 %
MPV: 9.7 fL (ref 8.7–12.5)
NEUTROPHIL #: 2.61 10*3/uL (ref 1.50–7.70)
NEUTROPHIL %: 52 %
PLATELETS: 210 10*3/uL (ref 150–400)
RBC: 5 10*6/uL (ref 4.50–6.10)
RDW-CV: 13 % (ref 11.5–15.5)
WBC: 5 10*3/uL (ref 3.7–11.0)

## 2021-11-12 LAB — C-REACTIVE PROTEIN(CRP),INFLAMMATION: CRP INFLAMMATION: 2.1 mg/L (ref <8.0)

## 2021-11-12 LAB — SEDIMENTATION RATE: ERYTHROCYTE SEDIMENTATION RATE (ESR): 2 mm/hr (ref 0–15)

## 2021-11-12 LAB — LIPASE: LIPASE: 37 U/L (ref 10–60)

## 2021-11-12 NOTE — H&P (Signed)
[facility], [place]  [place] [address]  [phone]    Progress Note  Name: Sean Wall  MRN: [id number]  DOB: 12-29-1955  Age: 66 y.o.  Date: 11/12/2021    Reason for Visit: Follow Up (Patient presents today for abdominal pain. It has been present for 2 weeks. Pain is located in RUQ. He states he does have a hernia in that area. Describes the pain as a stabbing pain that peaks and then dulls. If he moves a certain way it hurts more. Had diarrhea yesterday, but before then they were normal. No blood or mucous in stool. He does have hydrocodone that he uses when the pain gets really bad.)      History of Present Illness:  Sean Wall is a 66 y.o. male who presents today for abdominal pain.    Patient with history of ischemic colitis. States that he has been having RUQ pain for the last 2 weeks. Moving bowels 3-5 times daily. States that he was having hard stools and states that he increased his water intake. States that yesterday he had some loose stool. States he had what appeared as pieces of flesh. Denies hematochezia or melena. Occasional use of peppermint oil. Pain is worse with movement. No change with PO intake. No change with bowel movements. Has not tried anything for pain. Pain is constant, but does change in intensity. States he has a hernia in the RUQ. Denies associated nausea and vomiting.     Prior Testing:     Colonoscopy 04/09/2021   Grade 1 internal hemorrhoids   Area of thickened and dark ulcerated mucosa from distal transverse into the descending colon - marked ischemic type colitis    Patient History:  Past Medical History:   Diagnosis Date   . Acid reflux    . Enlarged prostate    . Erectile dysfunction    . Headache    . Hemorrhoids    . HTN (hypertension)    . Ischemic colitis (CMS [facility])      Past Surgical History:   Procedure Laterality Date   . COLONOSCOPY  04/09/2021   . HX APPENDECTOMY     . HX CARPAL TUNNEL RELEASE Bilateral    . HX HERNIA  REPAIR     . HX LUMBAR FUSION     . HX SHOULDER SURGERY Bilateral      Current Outpatient Medications   Medication Sig   . atorvastatin (LIPITOR) 40 mg Oral Tablet Take 1 Tablet (40 mg total) by mouth Every evening   . azelastine-fluticasone 137-50 mcg/spray Nasal Spray, Non-Aerosol Administer 2 Sprays into affected nostril(s) Every night (Patient not taking: Reported on 05/03/2021)   . buPROPion (WELLBUTRIN XL) 300 mg extended release 24 hr tablet Take 1 Tablet (300 mg total) by mouth Once a day   . cetirizine (ZYRTEC) 10 mg Oral Tablet Take 10 mg by mouth Once a day (Patient not taking: Reported on 05/03/2021)   . cyanocobalamin (VITAMIN B12) 1,000 mcg/mL Injection Solution 1 mL (1,000 mcg total) Every 30 days   . cyclobenzaprine (FLEXERIL) 10 mg Oral Tablet Take 10 mg by mouth Three times a day as needed (Patient not taking: Reported on 05/03/2021)   . ferrous sulfate (FERATAB) 324 mg (65 mg iron) Oral Tablet, Delayed Release (E.C.) Take 324 mg by mouth Every other day On Monday, Wednesday, friday (Patient not taking: Reported on 04/26/2021)   . finasteride (PROSCAR) 5 mg Oral Tablet Take 1 Tablet (  5 mg total) by mouth Once a day   . gabapentin (NEURONTIN) 300 mg Oral Capsule Take 300 mg by mouth Three times a day (Patient not taking: Reported on 04/26/2021)   . HERBAL DRUGS ORAL Take 700 mg by mouth Three times a day PEPPERMINT LEAF   . HYDROcodone-acetaminophen (NORCO) 5-325 mg Oral Tablet Take 1 Tablet by mouth Every 8 hours as needed for Pain   . Ibuprofen (MOTRIN) 600 mg Oral Tablet Take 1 Tablet (600 mg total) by mouth Four times a day as needed for Pain   . melatonin 3 mg Oral Capsule Take 2 Capsules (6 mg total) by mouth Every night   . metoprolol tartrate (LOPRESSOR) 25 mg Oral Tablet Take 1 Tablet (25 mg total) by mouth Once a day   . pantoprazole (PROTONIX) 20 mg Oral Tablet, Delayed Release (E.C.) Take 1 Tablet (20 mg total) by mouth Every morning before breakfast   . simethicone (MYLICON) 80 mg Oral  Tablet, Chewable Chew 80 mg Every 6 hours as needed (Patient not taking: Reported on 04/26/2021)   . sumatriptan succinate (IMITREX) 100 mg Oral Tablet Take 1 Tablet (100 mg total) by mouth Once, as needed for Migraine May repeat in 2 hours in needed   . Tadalafil (CIALIS) 5 mg Oral Tablet Take 2 Tablets (10 mg total) by mouth Every 24 hours as needed   . tamsulosin (FLOMAX) 0.4 mg Oral Capsule Take 0.4 mg by mouth Every evening after dinner (Patient not taking: Reported on 11/12/2021)   . Testosterone 1 % (25 mg/2.5gram) Transdermal Gel in Packet Place 2 Each on the skin Once a day 2 pumps each shoulder daily am     Allergies   Allergen Reactions   . Propofol Rash   . Lithium      Blurred vision     Family Medical History:     Problem Relation (Age of Onset)    No Known Problems Mother, Father, Sister, Brother, Half-Sister, Half-Brother, Maternal Aunt, Maternal Uncle, Paternal 44, Paternal Uncle, Maternal Grandmother, Maternal Grandfather, Paternal 68, Paternal 43, Daughter, Son, Other        Social History     Socioeconomic History   . Marital status: Married     Spouse name: Not on file   . Number of children: Not on file   . Years of education: Not on file   . Highest education level: Not on file   Occupational History   . Not on file   Tobacco Use   . Smoking status: Light Smoker     Types: Cigars   . Smokeless tobacco: Never   . Tobacco comments:     OCCASIONAL SMOKING-1 CIGAR EVERY 2 WEEKS   Vaping Use   . Vaping Use: Never used   Substance and Sexual Activity   . Alcohol use: Yes     Alcohol/week: 21.0 standard drinks     Types: 21 Cans of beer per week     Comment: 2 drinks daily   . Drug use: Never   . Sexual activity: Not on file   Other Topics Concern   . Not on file   Social History Narrative   . Not on file     Social Determinants of Health     Financial Resource Strain: Not on file   Transportation Needs: Not on file   Social Connections: Not on file   Intimate Partner Violence: Not  on file   Housing Stability: Not on file  Review of Systems:  General Positive for: Fatigue out of ordinary  General Negative for: Appetite Loss, Weight loss     ENT Negative for: Sore throat, Mouth sores, Nose bleeds     Respiratory Negative for: Coughing up blood, Wheezing, Nocturnal cough     Cardiovascular Negative for: Indigestion, Leg edema, Chest Pain  Gastronintestinal Positive for: Abdominal pain  Gastronintestinal Negative for: Black/Tarry stools, Change in bowl habits, Difficulty swallowing, Heartburn, Nausea, Vomiting     Genitourinary Negative for: Pelvic Pain, Urinary frequency  All other review of systems negative    Physical Exam:  BP 106/78   Pulse 60   Ht 1.778 m ('5\' 10"'$ )   Wt 95.8 kg (211 lb 3.2 oz)   SpO2 95%   BMI 30.30 kg/m       Physical Exam  Constitutional:       General: He is not in acute distress.     Appearance: Normal appearance. He is not ill-appearing or diaphoretic.   HENT:      Head: Normocephalic and atraumatic.   Pulmonary:      Effort: Pulmonary effort is normal.   Abdominal:      General: Bowel sounds are normal. There is no distension.      Palpations: Abdomen is soft. There is no mass.      Tenderness: There is abdominal tenderness.      Comments: Mild RUQ and LLQ TTP   Musculoskeletal:         General: Normal range of motion.   Skin:     General: Skin is dry.   Neurological:      Mental Status: He is alert and oriented to person, place, and time.   Psychiatric:         Behavior: Behavior normal.         Judgment: Judgment normal.       No results were found from the past 30 days.      Assessment:      ICD-10-CM    1. Change in bowel habit  R19.4 XR KUB     CBC/DIFF     COMPREHENSIVE METABOLIC PANEL, NON-FASTING     LIPASE     C-REACTIVE PROTEIN(CRP),INFLAMMATION     SEDIMENTATION RATE      2. Abdominal pain, unspecified abdominal location  R10.9 XR KUB     CBC/DIFF     COMPREHENSIVE METABOLIC PANEL, NON-FASTING     LIPASE     C-REACTIVE PROTEIN(CRP),INFLAMMATION      SEDIMENTATION RATE          38M with history of ischemic colitis. Having RUQ pain. Worse with movement and palpation. No change with PO intake or bowel movements. States that his stool has been harder but did have a loose stool yesterday. No blood noted. States that he is having flesh appearing pieces in his stool.    Plan:    1) Abdominal pain   RUQ tenderness to palpation and movement   Does not appear to be gallbladder   No associated nausea and vomiting   Labs per order   Check XR KUB   Treatment pending testing results    Follow up at next available with Dr. Althea Grimmer, FNP-C  11/12/2021, 08:00    This note may have been partially generated using MModal Fluency Direct system, and there may be some incorrect words, spellings, and punctuation that were not noted in checking the note before saving, though  effort was made to avoid such errors.

## 2021-12-08 ENCOUNTER — Encounter (INDEPENDENT_AMBULATORY_CARE_PROVIDER_SITE_OTHER): Payer: Self-pay | Admitting: Urology

## 2021-12-08 ENCOUNTER — Other Ambulatory Visit: Payer: Self-pay

## 2021-12-08 ENCOUNTER — Ambulatory Visit: Payer: 59 | Attending: Urology | Admitting: Urology

## 2021-12-08 VITALS — BP 137/93 | HR 65 | Temp 97.3°F | Resp 18 | Ht 70.0 in | Wt 204.6 lb

## 2021-12-08 DIAGNOSIS — N138 Other obstructive and reflux uropathy: Secondary | ICD-10-CM

## 2021-12-08 DIAGNOSIS — R351 Nocturia: Secondary | ICD-10-CM

## 2021-12-08 DIAGNOSIS — N3941 Urge incontinence: Secondary | ICD-10-CM

## 2021-12-08 DIAGNOSIS — Z125 Encounter for screening for malignant neoplasm of prostate: Secondary | ICD-10-CM

## 2021-12-08 DIAGNOSIS — N529 Male erectile dysfunction, unspecified: Secondary | ICD-10-CM | POA: Insufficient documentation

## 2021-12-08 DIAGNOSIS — N401 Enlarged prostate with lower urinary tract symptoms: Secondary | ICD-10-CM

## 2021-12-08 DIAGNOSIS — N5089 Other specified disorders of the male genital organs: Secondary | ICD-10-CM

## 2021-12-08 DIAGNOSIS — N5314 Retrograde ejaculation: Secondary | ICD-10-CM

## 2021-12-08 MED ORDER — TADALAFIL 5 MG TABLET
10.0000 mg | ORAL_TABLET | ORAL | 5 refills | Status: AC | PRN
Start: 2021-12-08 — End: 2022-01-07

## 2021-12-08 NOTE — Progress Notes (Signed)
NAME :Sean Wall  AGE: 66 y.o.  DATE: 12/08/2021  SERVICE: Urology        Chief Complaint   Patient presents with   . Erectile Dysfunction       HPI:  This is a 66 y.o. male here for evaluation of ejaculatory dysfunction.  He gives a history of TURP in 2005 or 2006.  He states that this was a formal resection but he continued to have normal ejaculation.  He then began having some difficulties with ejaculation around 2016.  He now has rare ejaculations or orgasm.  He does describe some recent leakage of urine with climax.  He describes good quality erections.  He does relate that he was started on finasteride and tamsulosin about 2 years ago primarily for nocturia.  He has stopped Flomax and anejaculation persists  He states he also suffers from urinary urgency, sometimes leaking urine prior to making it to the bathroom on time.  He denies hematuria or dysuria.        Past Medical History  Past Medical History:   Diagnosis Date   . Acid reflux    . Enlarged prostate    . Erectile dysfunction    . Headache    . Hemorrhoids    . HTN (hypertension)    . Ischemic colitis (CMS [facility])            Past Surgical History  Past Surgical History:   Procedure Laterality Date   . COLONOSCOPY  04/09/2021   . HX APPENDECTOMY     . HX CARPAL TUNNEL RELEASE Bilateral    . HX HERNIA REPAIR     . HX LUMBAR FUSION     . HX SHOULDER SURGERY Bilateral              Allergies  Allergies   Allergen Reactions   . Propofol Rash   . Lithium      Blurred vision         Medications  atorvastatin (LIPITOR) 40 mg Oral Tablet, Take 1 Tablet (40 mg total) by mouth Every evening  azelastine-fluticasone 137-50 mcg/spray Nasal Spray, Non-Aerosol, Administer 2 Sprays into affected nostril(s) Every night  buPROPion (WELLBUTRIN XL) 300 mg extended release 24 hr tablet, Take 1 Tablet (300 mg total) by mouth Once a day  cetirizine (ZYRTEC) 10 mg Oral Tablet, Take 10 mg by mouth Once a day (Patient not taking: Reported on 05/03/2021)  cyanocobalamin (VITAMIN  B12) 1,000 mcg/mL Injection Solution, 1 mL (1,000 mcg total) Every 30 days (Patient not taking: Reported on 12/08/2021)  cyclobenzaprine (FLEXERIL) 10 mg Oral Tablet, Take 1 Tablet (10 mg total) by mouth Three times a day as needed  ferrous sulfate (FERATAB) 324 mg (65 mg iron) Oral Tablet, Delayed Release (E.C.), Take 324 mg by mouth Every other day On Monday, Wednesday, friday (Patient not taking: Reported on 04/26/2021)  finasteride (PROSCAR) 5 mg Oral Tablet, Take 1 Tablet (5 mg total) by mouth Once a day  gabapentin (NEURONTIN) 300 mg Oral Capsule, Take 1 Capsule (300 mg total) by mouth Three times a day  HERBAL DRUGS ORAL, Take 700 mg by mouth Three times a day PEPPERMINT LEAF  HYDROcodone-acetaminophen (NORCO) 5-325 mg Oral Tablet, Take 1 Tablet by mouth Every 8 hours as needed for Pain  Ibuprofen (MOTRIN) 600 mg Oral Tablet, Take 1 Tablet (600 mg total) by mouth Four times a day as needed for Pain  lactobacillus comb no.10 (PROBIOTIC) 20 billion cell Oral Capsule, Take by mouth  melatonin 3 mg Oral Capsule, Take 2 Capsules (6 mg total) by mouth Every night  metoprolol tartrate (LOPRESSOR) 25 mg Oral Tablet, Take 1 Tablet (25 mg total) by mouth Once a day  pantoprazole (PROTONIX) 20 mg Oral Tablet, Delayed Release (E.C.), Take 1 Tablet (20 mg total) by mouth Every morning before breakfast  QUEtiapine (SEROQUEL) 50 mg Oral Tablet, Take 1 Tablet (50 mg total) by mouth Once a day  simethicone (MYLICON) 80 mg Oral Tablet, Chewable, Chew 80 mg Every 6 hours as needed (Patient not taking: Reported on 04/26/2021)  sumatriptan succinate (IMITREX) 100 mg Oral Tablet, Take 1 Tablet (100 mg total) by mouth Once, as needed for Migraine May repeat in 2 hours in needed  tamsulosin (FLOMAX) 0.4 mg Oral Capsule, Take 0.4 mg by mouth Every evening after dinner (Patient not taking: Reported on 11/12/2021)  Testosterone 1 % (25 mg/2.5gram) Transdermal Gel in Packet, Place 2 Each on the skin Once a day 2 pumps each shoulder daily  am  Tadalafil (CIALIS) 5 mg Oral Tablet, Take 2 Tablets (10 mg total) by mouth Every 24 hours as needed    No facility-administered medications prior to visit.      Past Social History  He is a English as a second language teacher.  He smokes cigars.  No illicit drug use.    Family History   Neg for GU malignancy      ROS:  Please see HPI.  All other systems were reviewed by me and are negative.                OBJECTIVE:  PHYSICAL EXAM:    BP (!) 137/93   Pulse 65   Temp 36.3 C (97.3 F) (Thermal Scan)   Resp 18   Ht 1.778 m ('5\' 10"'$ )   Wt 92.8 kg (204 lb 9.4 oz)   SpO2 99%   BMI 29.36 kg/m       General:  NAD.  Vital signs reviewed.  Skin:  Warm, dry, and without suspicious lesion.    HEENT:  Head is normocephalic.  Hearing adequate for conversation  Pulmonary:  Respiration unlabored.  No audible wheeze.     Cardiovascular:  No JVD.    MS:  Ambulates without difficulty.  No CVAT.   GI: The abdomen is soft, nontender and nondistended. No palpable hernia.   GU:  Phallus is unremarkable.  Urinary meatus is patent.  Scrotum is unremarkable.  Testicles are descended bilaterally.  They are non-tender and without suspicious nodules.  Cord structures and epididymis are unremarkable.  Perineum unremarkable,  Digital rectal exam reveals normal rectal sphincter tone.  The prostate is moderate in size.  It is soft, non-tender and without suspicious nodules.  Seminal vesicles are non-palpable.      LABS:  He reports that his PSAs are checked through the [facility].  04/12/2021-cr 1.08  04/12/2021-WBC 5.1, Hgb 14.2, plt 235      Urine Dip Results:         DIAGNOSTIC STUDIES:  PATIENT NAME: Sean Wall, Sean Wall MED REC NO: [id number]   BIRTH DATE: 01/06/1956 ORDER: [id number]   SEX: M ORDER FROM: [facility]   REQUESTING PHY[place]arriet [place] SERVICE DATE: 05/12/2021 10:31 AM    ATTENDING Oris DroneLenell Antu, Beth REPORT DATE: 05/12/2021 11:10 AM  05/21/2021  7:31 AM   REASON: Colitis     Final  CT ANGIO ABDOMEN PELVIS W/WO IV CONTRAST  Addendum    ADDENDUM:    Multiplanar and 3-D reconstructions are submitted with this exam.      Radiologist location ID: [id number]     Addended by Joesph Fillers, MD on 05/21/2021 7:31 AM     Study Result    Narrative & Impression   Male, 66 years old.    CT ANGIO ABDOMEN PELVIS W/WO IV CONTRAST performed on 05/12/2021 10:31 AM.    REASON FOR EXAM:  K52.9: Colitis  RADIATION DOSE: 1064.28 (mGy*cm)  CONTRAST: 100 ml's of Isovue 370    TECHNIQUE: Enhanced 5 mm transaxial and sagittal coronal reconstruction imaging performed through the abdomen and pelvis   The CT scanner is equipped with dose reducing technology. The MAS is automatically adjusted to the patient body size in order to deliver the lowest dose possible.    COMPARISON: 04/08/2021    FINDINGS: The abdominal aorta shows normal contrast enhancement. No significant atheromatous changes. There is a small calcification at origin of celiac axis. The superior mesenteric artery widely patent. No atheromatous change is detected in the superior mesenteric artery. The inferior mesenteric artery is widely patent.    The common iliac arteries, internal and external iliac arteries are patent. The common femoral arteries and proximal superficial femoral arteries and profundus femoral arteries are widely patent.    Images lower lungs are clear.    The liver and spleen appear unremarkable. Adrenal glands and pancreas and gallbladder appear unremarkable. The kidneys show symmetric functioning.    Postsurgical changes are noted in the spine. The bladder appears unremarkable.    The bowel wall thickening in the colon has resolved. No acute intra-abdominal process is seen on today's study.    IMPRESSION:  1. No stenosis is seen in the superior mesenteric artery or inferior mesenteric artery.  2. No aortoiliac occlusive disease is seen.  3. There is a small amount of plaque at origin of the celiac axis which results in mild stenosis at the origin of celiac  axis. Question clinical significance.  4. Interval resolution of bowel wall thickening in the colon.      Radiologist location ID: [place]    View Report   View Report     This result has been Addended.  See Addendum at the top of this result.              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 Retrograde ejaculation  3. climacturia  4. ED  5. Urge incontinence        PLAN:      -Increased dose of Cialis to 10 mg   - Cysto CMG with general urology to assess for etiology of urge incontinence and possible need for re-treatment of BPH as patient is >1 decade out from TURP. Does not need to hold any anticoag. Scheduling messaged.     Orders Placed This Encounter   . PSA SCREENING   . Tadalafil (CIALIS) 5 mg Oral Tablet     Patient seen as a shared visit with cosigned physician Dr. Annamarie Dawley. See cosigned physician note for MDM.     Roselyn Reef, PA-C  12/08/2021, 21:58

## 2021-12-15 ENCOUNTER — Telehealth (INDEPENDENT_AMBULATORY_CARE_PROVIDER_SITE_OTHER): Payer: Self-pay | Admitting: UROLOGY

## 2021-12-15 NOTE — Telephone Encounter (Signed)
I called the patient to schedule them for a procedure with Dr. Morley. There was no answer. I will wait for patient's return call.

## 2021-12-21 ENCOUNTER — Other Ambulatory Visit: Payer: Self-pay

## 2021-12-21 ENCOUNTER — Other Ambulatory Visit: Payer: Medicare (Managed Care) | Attending: Physician Assistant

## 2021-12-21 DIAGNOSIS — Z125 Encounter for screening for malignant neoplasm of prostate: Secondary | ICD-10-CM | POA: Insufficient documentation

## 2021-12-21 LAB — PSA SCREENING: PSA: 1.4 ng/mL (ref <=4.00)

## 2021-12-24 ENCOUNTER — Encounter (HOSPITAL_COMMUNITY): Payer: Non-veteran care | Admitting: UROLOGY

## 2021-12-24 ENCOUNTER — Other Ambulatory Visit: Payer: Self-pay

## 2021-12-24 ENCOUNTER — Encounter (HOSPITAL_COMMUNITY): Payer: Self-pay | Admitting: UROLOGY

## 2021-12-24 ENCOUNTER — Encounter (HOSPITAL_COMMUNITY)
Admission: RE | Disposition: A | Discharge status: Home / Self Care | Payer: Self-pay | Source: Ambulatory Visit | Attending: UROLOGY

## 2021-12-24 ENCOUNTER — Inpatient Hospital Stay
Admission: RE | Admit: 2021-12-24 | Discharge: 2021-12-24 | Disposition: A | Discharge status: Home / Self Care | Payer: 59 | Source: Ambulatory Visit | Attending: UROLOGY | Admitting: UROLOGY

## 2021-12-24 DIAGNOSIS — N529 Male erectile dysfunction, unspecified: Secondary | ICD-10-CM | POA: Insufficient documentation

## 2021-12-24 DIAGNOSIS — N5314 Retrograde ejaculation: Secondary | ICD-10-CM | POA: Insufficient documentation

## 2021-12-24 DIAGNOSIS — N138 Other obstructive and reflux uropathy: Secondary | ICD-10-CM | POA: Insufficient documentation

## 2021-12-24 DIAGNOSIS — N3941 Urge incontinence: Secondary | ICD-10-CM | POA: Insufficient documentation

## 2021-12-24 DIAGNOSIS — N401 Enlarged prostate with lower urinary tract symptoms: Secondary | ICD-10-CM | POA: Insufficient documentation

## 2021-12-24 SURGERY — CYSTOSCOPY WITH CYSTOMETROGRAM AND URODYNAMICS
Anesthesia: Local (Nurse-Monitored) | Wound class: Clean Contaminated Wounds-The respiratory, GI, Genital, or urinary

## 2021-12-24 MED ORDER — SULFAMETHOXAZOLE 800 MG-TRIMETHOPRIM 160 MG TABLET
1.0000 | ORAL_TABLET | Freq: Once | ORAL | Status: AC
Start: 2021-12-24 — End: 2021-12-24
  Administered 2021-12-24: 160 mg via ORAL
  Filled 2021-12-24: qty 1

## 2021-12-24 MED ORDER — LIDOCAINE 2 % MUCOSAL JELLY IN APPLICATOR
Freq: Once | Status: AC
Start: 2021-12-24 — End: 2021-12-24

## 2021-12-24 SURGICAL SUPPLY — 27 items
BAG 28X36IN BAND EQP (DRAPE/PACKS/SHEETS/OR TOWEL) ×1 IMPLANT
CATH URETH 12FR 16IN UNIV STGR EYE FNL END VNYL STRL LF  CLR (UROLOGICAL SUPPLIES) IMPLANT
CATH URETH 12FR 16IN UNIV STGR_EYE FNL END VNYL STRL LF CLR (UROLOGICAL SUPPLIES)
CATH URODY 7FR VAG RCTL ABD (UROLOGICAL SUPPLIES) ×1
CATH URODY 7FR VAGINAL RECTAL ABD (UROLOGICAL SUPPLIES) ×1 IMPLANT
CATH URODY AIR-CHRG 7FR 19.7IN SENSOR LUM BLADDER SHAFT URETHRA PE DISP (UROLOGICAL SUPPLIES) IMPLANT
CATH URODY AIR-CHRG 7FR 19.7IN_1 SNSR LUM BLA SHFT URETHRA (UROLOGICAL SUPPLIES)
CATH URODY TDOC AIR-CHRG 7FR COUDE SENSOR INFUS PORT BAL PRESS BLADDER DISP (UROLOGICAL SUPPLIES) ×1 IMPLANT
CATH URODY TDOC AIR-CHRG 7FR C_DE SNSR INFS PORT BAL PRESS (UROLOGICAL SUPPLIES) ×1
COLLECTOR MSRT 32OZ SLANT PNT_GRAD NONST WHT SPECI (MISCELLANEOUS PT CARE ITEMS)
COLLECTOR MSRT SLANT GRAD C32OZ NONST WHT SPECI (MISCELLANEOUS PT CARE ITEMS) IMPLANT
CONTAINR POLYPROP 32OZ PRNT GRAD FROST PNL UNBRK SPECI 3ANG (MISCELLANEOUS PT CARE ITEMS) ×1 IMPLANT
CONV USE ITEM 322843 - MAT OR SEAFOAM GRN 40X28IN SURGISAFE BRR BCK SLIP FREE (DRAPE/PACKS/SHEETS/OR TOWEL) IMPLANT
CONV USE ITEM 328276 - ELECTRODE TAPE PREATTACH LEADWIRE PED DDRGL EKG NONST LF  NTRD2 CLR DISP (MED SURG SUPPLIES) ×1 IMPLANT
CONV USE ITEM 337909 - PACK SURG MIN CYSTO STRL DISP LTX (CUSTOM TRAYS & PACK) ×1 IMPLANT
CYL GRAD POLYPROP 32OZ FROST P_NL TRANSLUC 3ANG (MISCELLANEOUS PT CARE ITEMS) ×1
DRAPE BND BAG 28X36IN SNAP KOV_ER EQP (DRAPE/PACKS/SHEETS/OR TOWEL) ×2
ELECTRODE TAPE PREATTACH LEADW_IRE PED DDRGL EKG NONST LF (MED SURG SUPPLIES) ×1
MAT OR SEAFOAM GRN 40X28IN SURGISAFE BRR BCK SLIP FREE (DRAPE/PACKS/SHEETS/OR TOWEL)
PACK CYSTO MINOR (CUSTOM TRAYS & PACK) ×1
PACK SURG MIN CYSTO STRL DISP LTX (CUSTOM TRAYS & PACK) ×1
SET TUBING W/INFUSION LINE_TUB500 BX/25 (UROLOGICAL SUPPLIES) ×1
SOL IV 0.9% NACL PVC 1000ML PH 5 PLASTIC CONTAINR PRSV FR VIAFLEX LF (MEDICATIONS/SOLUTIONS) ×2 IMPLANT
SOLUTION IV NS 1000CC 2B1324X_14/CS (MEDICATIONS/SOLUTIONS) ×2
SPONGE GAUZE 4X4IN MDCHC COTTON 12 PLY TY 7 LF  STRL DISP (WOUND CARE SUPPLY) ×1 IMPLANT
SPONGE GAUZE 4X4IN MDCHC COTTO_N 12 PLY TY 7 LF STRL DISP (WOUND CARE/ENTEROSTOMAL SUPPLY) ×1
TUBING URODY 400CM SIL PVC PUMP INFUS LINE STRL LF (UROLOGICAL SUPPLIES) ×1 IMPLANT

## 2021-12-24 NOTE — H&P (Signed)
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 Retrograde ejaculation  3. climacturia  4. ED  5. Urge incontinence    HPI: Sean Wall is a 66 y.o. male with a history of above who presents for cystoscopy and urodynamics. The patient was last seen in clinic on 12/08/21. No new issues.    PMHx:   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CMS [facility])           PSHx:   Past Surgical History:   Procedure Laterality Date    COLONOSCOPY  04/09/2021    HX APPENDECTOMY      HX CARPAL TUNNEL RELEASE Bilateral     HX HERNIA REPAIR      HX LUMBAR FUSION      HX SHOULDER SURGERY Bilateral          Family Hx: Marland Kitchen  Family Medical History:       Problem Relation (Age of Onset)    No Known Problems Mother, Father, Sister, Brother, Half-Sister, Half-Brother, Maternal 54, Maternal Uncle, Paternal 23, Paternal Uncle, Maternal Grandmother, Maternal Grandfather, Paternal Grandmother, Paternal Grandfather, Daughter, Son, Other            Social Hx:   Social History     Socioeconomic History    Marital status: Married     Spouse name: Not on file    Number of children: Not on file    Years of education: Not on file    Highest education level: Not on file   Occupational History    Not on file   Tobacco Use    Smoking status: Light Smoker     Types: Cigars    Smokeless tobacco: Never    Tobacco comments:     OCCASIONAL SMOKING-1 CIGAR EVERY 2 WEEKS   Vaping Use    Vaping Use: Never used   Substance and Sexual Activity    Alcohol use: Yes     Alcohol/week: 21.0 standard drinks     Types: 21 Cans of beer per week     Comment: 2 drinks daily    Drug use: Never    Sexual activity: Not on file   Other Topics Concern    Not on file   Social History Narrative     Not on file     Social Determinants of Health     Financial Resource Strain: Not on file   Transportation Needs: Not on file   Social Connections: Not on file   Intimate Partner Violence: Not on file   Housing Stability: Not on file     Medications:   No current outpatient medications on file.     Allergies:   Allergies   Allergen Reactions    Propofol Rash    Lithium      Blurred vision       ROS:  All 10 systems were reviewed.  There were pertinent positives in: Genital urinary system  There are no new changes to the prior reviewed review of systems.    Physical Exam:  There were no vitals  taken for this visit.      General - Alert/oriented; NAD  Neck - Supple, trachea midline  Lungs - Respirations are unlabored, good inspiratory effort  Abdomen - Non-distended   Musculoskeletal - Four extremities intact  Neurological - CN II-XII Grossly intact  GU system - Deferred. Plan to examine in OR    Assessment:  66 y.o. 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 Retrograde ejaculation  3. climacturia  4. ED  5. Urge incontinence    Plan:  To OR for cystoscopy and urodynamics   Consent obtained  The patient understands the risks of the procedure, which include bleeding, infection, recurrence, damage to the surrounding structures, failure to correct pathology, loss of sensation, and the risks of anesthesia (heart attack, stroke, death, etc.)    All questions of patient and family answered    Venita Sheffield, MD 12/24/2021, 08:01  [facility]  Department of Urology Resident        I saw and examined the patient.  I reviewed the resident's note.  I agree with the findings and plan of care as documented in the resident's note.  Any exceptions/additions are edited/noted.    Mali Edwin Victorious Kundinger, MD

## 2021-12-24 NOTE — Nurses Notes (Signed)
Pt is being discharge to home, AVS explained to Pt and spouse/family, They verbed understanding, Pt will be discharge via self.

## 2021-12-24 NOTE — OR Surgeon (Signed)
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 Retrograde ejaculation  3. climacturia  4. ED  5. Urge incontinence    POSTOPERATIVE DIAGNOSIS:   Same     NAME OF PROCEDURE:   1. Complex Cystometrogram    2. Complex Uroflowmetry    3. Voiding pressure studies    4. Flexible cystourethroscopy    FINDINGS:   1. Normal appearing bladder mucosa   2. No evidence of involuntary detrusor contractions (spontaneous or provoked) during the filling phase  3. No evidence of involuntary urinary leaking during bladder filling or with provocation (i.e. valsalva and cough)  4. Normal capacity bladder (475 mL's)  5. Normal bladder emptying (PVR of 0 mL's)  6. No masses, lesions, or stones noted within the bladder lumen  7. Bilateral ureteral orifices in normal anatomic position with good efflux   8. Fossa navicularis, pendulous, and bulbar urethra appear widely patent without evidence of lesions or strictures  9. Good circumferential coaptation at level of membranous urethra (external urethral sphincter)  10. Bladder and urethral mucosa appear uniformly smooth, pinkish in color, and flat without any suspicious areas of hyperemia, increased vascularization, or mucosal edema.     11. Prostate with post-surgical changes (see photos below)    SURGEON(S): Mali Tallen Schnorr, MD   RESIDENT(S): Venita Sheffield, MD    ANESTHESIA: 2% lidocaine gel.   ESTIMATED BLOOD LOSS: None.   COMPLICATIONS: None.    INDICATIONS FOR PROCEDURE: 66 y.o. patient with history of male presents for further evaluation with cystoscopy and CMG.    DESCRIPTION OF PROCEDURE: The patient was consented preoperatively and all risks and benefits of the procedure were explained including bleeding, infection, pain, bladder and urethral injury. He was then  brought back to the cystoscopy suite and positioned for the urodynamic study. A 7-French urodynamics catheter was placed through the urethra and secured to the thigh with a piece of tape. Patch EMG electrodes were placed on either side of the perineum and a small rectal tube was placed to monitor abdominal pressure. Medium fill cystometry was undertaken.     First sensation    212 mL's   First desire    344 mL's   Strong desire    355 mL's   Urgency    400 mL's  Maximum capacity   475 mL's  Able to void   548 mL's total   Peak velocity (Qmax)  6.3 mL/sec   Pressure at peak flow   -2.0 cm/H2O   PVR     0 mL's     During this filling phase, he was asked to cough, sneeze and strain and had no loss of urine. Following urodynamics, he was laid in the lithotomy position and his genitalia were prepped and draped in the standard sterile fashion. 10cc of 2% Lidocaine Uro-Jet were infused into the urethra. We passed a 17-French flexible cystoscope through the male urethra and into the bladder under direct visualization. Upon entering the bladder, panendoscopic views were obtained. Findings noted above. The scope was then pulled back to examine the prostatic urethra. The scope was then withdrawn, visualizing the entire urethra. The patient tolerated this procedure well and was then taken back to the post-operative area in stable condition.  Dr. Mali Amore Grater was present and participated in the entire procedure        DISPOSITION: The patient will be discharged home. He was given preoperative antibiotics. He will follow-up in 4-6 weeks with Dr. Annamarie Dawley.      Venita Sheffield, MD   [facility]  Department of Urology  Resident   12/24/2021, 09:48        I was present for the entire viewing portion of the endoscopy from insertion to removal of scope.    Mali Edwin Areatha Kalata, MD

## 2021-12-24 NOTE — Discharge Instructions (Signed)
SURGICAL DISCHARGE INSTRUCTIONS     Dr. Morley, Chad Edwin, MD  performed your CYSTOSCOPY WITH CYSTOMETROGRAM AND URODYNAMICS today at the [facility]    [facility]:  Monday through Friday from 6 a.m. - 7 p.m.: [phone]  Between 7 p.m. - 6 a.m., weekends and holidays:  Call [facility] at [phone] or [phone].    PLEASE SEE WRITTEN HANDOUTS AS DISCUSSED BY YOUR NURSE:      SIGNS AND SYMPTOMS OF A WOUND / INCISION INFECTION   Be sure to watch for the following:   Increase in redness or red streaks near or around the wound or incision.   Increase in pain that is intense or severe and cannot be relieved by the pain medication that your doctor has given you.   Increase in swelling that cannot be relieved by elevation of a body part, or by applying ice, if permitted.   Increase in drainage, or if yellow / green in color and smells bad. This could be on a dressing or a cast.   Increase in fever for longer than 24 hours, or an increase that is higher than 101 degrees Fahrenheit (normal body temperature is 98 degrees Fahrenheit). The incision may feel warm to the touch.    **CALL YOUR DOCTOR IF ONE OR MORE OF THESE SIGNS / SYMPTOMS SHOULD OCCUR.    ANESTHESIA INFORMATION   LOCAL ANESTHETIC:  You have receieved a local anesthetic, the effects should disappear in a few hours.    REMEMBER   If you experience any difficulty breathing, chest pain, bleeding that you feel is excessive, persistent nausea or vomiting or for any other concerns:  Call your physician Dr. Morley at [phone] or [phone]. You may also ask to have the Urology doctor on call paged. They are available to you 24 hours a day.    SPECIAL INSTRUCTIONS / COMMENTS       FOLLOW-UP APPOINTMENTS   Please call patient services at [phone] or [phone] to schedule a date / time of return. They are open Monday - Friday from 7:30 am - 5:00 pm.

## 2021-12-24 NOTE — Discharge Summary (Signed)
[facility]  Department of Surgery  Discharge Day Note    Sean Wall  Date of service: 12/24/2021  Date of Admission:  12/24/2021  Hospital Day:  LOS: 0 days     Examination at discharge: stable  Vital Signs: Temperature: 36 C (96.8 F) (12/24/21 0807)  Heart Rate: 53 (12/24/21 0907)  BP (Non-Invasive): (!) 135/91 (12/24/21 4034)  Respiratory Rate: 20 (12/24/21 0807)  SpO2: 95 % (12/24/21 0907)  General: No distress    Assessment:   66 y.o. male s/p cysto/CMG    Disposition/Plan :   -Home discharge    -Follow up in 4-6 weeks with Dr. Annamarie Dawley for discussion on further management options      Venita Sheffield, MD  12/24/2021, 09:50       I saw and examined the patient prior to discharge and discussed all findings and follow up recommendations.  I approved and was present at the time of discharge.    Mali Edwin Brahim Dolman, MD  12/24/2021, 13:30

## 2022-01-07 ENCOUNTER — Other Ambulatory Visit: Payer: Self-pay

## 2022-01-07 ENCOUNTER — Inpatient Hospital Stay (HOSPITAL_BASED_OUTPATIENT_CLINIC_OR_DEPARTMENT_OTHER)
Admission: RE | Admit: 2022-01-07 | Discharge: 2022-01-07 | Disposition: A | Discharge status: Home / Self Care | Payer: 59 | Source: Ambulatory Visit | Admitting: Radiology

## 2022-01-07 ENCOUNTER — Ambulatory Visit
Payer: 59 | Attending: Student in an Organized Health Care Education/Training Program | Admitting: Student in an Organized Health Care Education/Training Program

## 2022-01-07 ENCOUNTER — Encounter (HOSPITAL_BASED_OUTPATIENT_CLINIC_OR_DEPARTMENT_OTHER): Payer: Self-pay | Admitting: Student in an Organized Health Care Education/Training Program

## 2022-01-07 VITALS — Temp 96.6°F | Ht 70.0 in | Wt 206.8 lb

## 2022-01-07 DIAGNOSIS — M25519 Pain in unspecified shoulder: Secondary | ICD-10-CM | POA: Insufficient documentation

## 2022-01-07 DIAGNOSIS — M12811 Other specific arthropathies, not elsewhere classified, right shoulder: Secondary | ICD-10-CM

## 2022-01-07 DIAGNOSIS — M19011 Primary osteoarthritis, right shoulder: Secondary | ICD-10-CM

## 2022-01-07 NOTE — Progress Notes (Signed)
[facility] Progress Note     Patient: Sean Wall  DOB: [birth year]  MRN: [id number]    Date: 01/07/2022    CHIEF COMPLAINT:  Chief Complaint   Patient presents with    Shoulder Pain     bilateral        HISTORY OF PRESENT ILLNESS:  66 year-old male who presents with right shoulder pain. The patient has had persistent right shoulder pain for many years. He denies any initial trauma. 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 Since that time, patient continued to have pain. He had additional surgeries in 2005, 2013 and 2015 to "clean out arthritis" in [place], [place].  Additionally, patient has pain, numbness or tingling that radiates down right arm. He has had injections in the past in his neck and shoulder which did not provide relief. He believes he has had over a dozen injections in shoulder with last being in 2018.  He states that the injections did provide him with good relief.  He takes hydrocodone daily for pain just prescribed by the [place].     In addition, the patient complains of pain and radiating nerve symptoms extending down his right arm.  He states that these are also occurring in his left side but not as common.  He states that certain positions of his neck reproduce the symptoms radiating down his arm.  He has previous lumbar spine surgery years ago.    Tobacco use: Occasional cigars  Alcohol use: Social   Patient ambulation status: no assistive device .       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[facility])             Past Surgical History:   Procedure Laterality Date    COLONOSCOPY  04/09/2021    HX APPENDECTOMY      HX CARPAL TUNNEL RELEASE Bilateral     HX HERNIA REPAIR      HX LUMBAR FUSION      HX SHOULDER SURGERY Bilateral             Family Medical History:       Problem Relation (Age of Onset)    No Known Problems Mother, Father, Sister, Brother, Half-Sister, Half-Brother, Maternal Aunt, Maternal Uncle, Paternal 52, Paternal Uncle, Maternal Grandmother, Maternal Grandfather, Paternal Grandmother, Paternal 64, Daughter, Son, Other                 Current Outpatient Medications:     atorvastatin (LIPITOR) 40 mg Oral Tablet, Take 1 Tablet (40 mg total) by mouth Every evening, Disp: , Rfl:     azelastine-fluticasone 137-50 mcg/spray Nasal Spray, Non-Aerosol, Administer 2 Sprays into affected nostril(s) Every night, Disp: , Rfl:     buPROPion (WELLBUTRIN XL) 300 mg extended release 24 hr tablet, Take 1 Tablet (300 mg total) by mouth Once a day, Disp: , Rfl:     cetirizine (ZYRTEC) 10 mg Oral Tablet, Take 10 mg by mouth Once a day (Patient not taking: Reported on 05/03/2021), Disp: , Rfl:     cyanocobalamin (VITAMIN B12) 1,000 mcg/mL Injection Solution, 1 mL (1,000 mcg total) Every 30 days (Patient  not taking: Reported on 12/08/2021), Disp: , Rfl:     cyclobenzaprine (FLEXERIL) 10 mg Oral Tablet, Take 1 Tablet (10 mg total) by mouth Three times a day as needed, Disp: , Rfl:     ferrous sulfate (FERATAB) 324 mg (65 mg iron) Oral Tablet, Delayed Release (E.C.), Take 324 mg by mouth Every other day On Monday, Wednesday, friday (Patient not taking: Reported on 04/26/2021), Disp: , Rfl:     finasteride (PROSCAR) 5 mg Oral Tablet, Take 1 Tablet (5 mg total) by mouth Once a day, Disp: , Rfl:     gabapentin (NEURONTIN) 300 mg Oral Capsule, Take 1 Capsule (300 mg total) by mouth Three times a day (Patient not taking: Reported on 01/07/2022), Disp: , Rfl:     HERBAL DRUGS ORAL, Take 700 mg by mouth Three times a day PEPPERMINT LEAF, Disp: , Rfl:     HYDROcodone-acetaminophen (NORCO) 5-325 mg Oral Tablet, Take 1 Tablet by mouth Every 8 hours as needed for Pain, Disp: , Rfl:     Ibuprofen (MOTRIN) 600 mg Oral Tablet, Take 1 Tablet (600 mg total)  by mouth Four times a day as needed for Pain, Disp: , Rfl:     lactobacillus comb no.10 (PROBIOTIC) 20 billion cell Oral Capsule, Take by mouth, Disp: , Rfl:     melatonin 3 mg Oral Capsule, Take 2 Capsules (6 mg total) by mouth Every night, Disp: , Rfl:     metoprolol tartrate (LOPRESSOR) 25 mg Oral Tablet, Take 1 Tablet (25 mg total) by mouth Once a day, Disp: , Rfl:     pantoprazole (PROTONIX) 20 mg Oral Tablet, Delayed Release (E.C.), Take 1 Tablet (20 mg total) by mouth Every morning before breakfast, Disp: , Rfl:     QUEtiapine (SEROQUEL) 50 mg Oral Tablet, Take 1 Tablet (50 mg total) by mouth Once a day, Disp: , Rfl:     simethicone (MYLICON) 80 mg Oral Tablet, Chewable, Chew 80 mg Every 6 hours as needed (Patient not taking: Reported on 04/26/2021), Disp: , Rfl:     sumatriptan succinate (IMITREX) 100 mg Oral Tablet, Take 1 Tablet (100 mg total) by mouth Once, as needed for Migraine May repeat in 2 hours in needed, Disp: , Rfl:     Tadalafil (CIALIS) 5 mg Oral Tablet, Take 2 Tablets (10 mg total) by mouth Every 24 hours as needed for up to 30 days Indications: the inability to have an erection, Disp: 30 Tablet, Rfl: 5    tamsulosin (FLOMAX) 0.4 mg Oral Capsule, Take 0.4 mg by mouth Every evening after dinner (Patient not taking: Reported on 11/12/2021), Disp: , Rfl:     Testosterone 1 % (25 mg/2.5gram) Transdermal Gel in Packet, Place 2 Each on the skin Once a day 2 pumps each shoulder daily am, Disp: , Rfl:      Allergies   Allergen Reactions    Propofol Rash    Lithium      Blurred vision        REVIEW OF SYSTEMS:  Review of Systems   All other systems reviewed and are negative.       PHYSICAL EXAM:  Temp 35.9 C (96.6 F)   Ht 1.778 m ('5\' 10"'$ )   Wt 93.8 kg (206 lb 12.7 oz)   BMI 29.67 kg/m     VITAL SIGNS: The vital signs and BMI are recorded in the cart, and reviewed prior to seeing the patient today.   GENERAL: The patient is well developed and well  nourished, awake, alert and oriented and appropriate  mood and affect. Normal gait and station.     RIGHT UPPER EXTREMITY  SKIN: The skin over the shows no rash, lesion or erythema and is comparable to the contralateral side.   SWELLING: There is no focal swelling, and no palpable joint effusion.   TENDERNESS: There is mild tenderness to palpation over the bicipital groove. Mild TTP lateral to the acromion.  ROM: Active and passive ROM of the right shoulder is full and symmetric to contralateral side. Full abduction, adduction, flexion, IR/ER of shoulder but this reproduces pain.    MUSCLE: Muscle strength is full 5/5 strength of supraspinatus, infraspinatus and subscapularis.  SENSATION: Sensation intact to light touch in axillary, median, radial and ulnar nerve distributions.  VASCULAR: Palpable radial pulse bilaterally. Excellent capillary refill to all digits.     +Spurling sign  Negative Hoffman sign bilateral upper extremity.     IMAGING:  MRI and XR were reviewed by Dr. Estill Cotta and per his review shows rotator cuff arthropathy with glenohumeral joint degeneration. 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 Humerus is slightly high riding on and MRI. Rotator cuff tear present on MRI    ASSESSMENT:  Right rotator cuff arthropathy, glenohumeral arthritis     PLAN:  Patient is a 66 year old male who presents with chronic right shoulder pain. We discussed with patient that has rotator cuff arthropathy and arthritis of his right shoulder. It was discussed that he has failed conservative management including injections, pain medications and activity modifications. It was discussed that patient will likely need reverse total shoulder arthoplasty in the future. In addition, patient described symptoms of cervical radiculopathy in right upper extremity. He has history of lumbar spinal fusion. He has not followed with spine recently. We will place referred to spine clinic at today's visit. Injection was given at today's  visit for glenohumeral arthritis. See procedure note for full details. Patient tolerated without any complications.     The patient was given the opportunity to ask questions. All of their questions were answered to their satisfaction. The patient is in agreement with our current treatment plan.     The patient will follow-up 6 weeks    Aldean Baker, MD    I saw and examined the patient with the resident I reviewed the above note.  I agree with the findings as documented in the above note. Any exceptions / additions are edited / noted. My substantive findings are:    Radiographs:  Imaged reviewed by me on the day of the encounter revealed glenohumeral joint narrowing with sclerosis and goat's beard osteophyte.  There is slight high riding of the humeral head.    My findings are consistent with:  Assessment/Plan   1. Shoulder pain, unspecified chronicity, unspecified laterality        Plan:   Discussed with the patient my diagnosis and treatment recommendations moving forward.  We discussed that he does have arthritis of the shoulder joint and does meet indications for surgery for this condition.  However, he does have some symptoms and findings on his physical exam that have me concerned for pathology of his cervical spine as well.  We discussed that shoulder joint replacement surgery would help his pain and motion in the shoulder but would not address this numbness and tingling symptoms in the right arm.  We discussed the utility of a repeat steroid injection today.  Patient elected to  proceed with this treatment option.  We will see him again in 6 weeks to monitor his progress.  In the interim, I encouraged him to seek an evaluation from 1 of our spine specialists.    Frutoso Chase, MD  Assistant Professor  [facility] Department of Orthopaedic Surgery    Frutoso Chase, MD 01/07/2022 13:16

## 2022-01-07 NOTE — Procedures (Signed)
[facility]  [place]  [place] [address]  Operated by [facility]  Procedure Note    Name: Sean Wall MRN:  [id number]   Date: 01/07/2022 Age: 66 y.o.       Joint Asp/Inj  Performed by: Frutoso Chase, MD  Authorized by: Frutoso Chase, MD     Consent:     Consent obtained:  Verbal    Consent given by:  Patient    Risks discussed:  Bleeding and infection  Universal protocol:     Patient identity confirmed:  Verbally with patient  Location:     Location:  Shoulder    Shoulder:  R glenohumeral  Procedure details:     Needle gauge:  20 G    Steroid injected: yes    Post-procedure details:     Dressing:  Adhesive bandage    Patient tolerance of procedure:  Tolerated well, no immediate complications      Aldean Baker, MD    I have discussed with the patient the risks of the injection including: infection; bleeding; damage to adjacent nerves/vessels/tendons/bones/ligaments; fat necrosis; tendon rupture; skin color changes. The patient verbalized their understanding of these risks and has asked me to proceed with the injection.    After obtaining verbal consent, I prepped the right shoulder with alcohol swabs, ethyl chloride spray and chloraprep and then injected a 60m:3mL mix of lidocaine 1%/celestone '6mg'$ /mL into the right glenohumeral joint. The puncture was covered with a Band-Aid. The patient tolerated the procedure well.    AFrutoso Chase MD  [facility]

## 2022-01-14 DIAGNOSIS — M19011 Primary osteoarthritis, right shoulder: Secondary | ICD-10-CM

## 2022-01-17 ENCOUNTER — Ambulatory Visit: Payer: Medicare (Managed Care)

## 2022-01-19 ENCOUNTER — Ambulatory Visit: Payer: 59 | Admitting: Physical Medicine & Rehabilitation

## 2022-01-19 ENCOUNTER — Encounter (HOSPITAL_BASED_OUTPATIENT_CLINIC_OR_DEPARTMENT_OTHER): Payer: Self-pay | Admitting: Physical Medicine & Rehabilitation

## 2022-01-19 ENCOUNTER — Other Ambulatory Visit: Payer: Self-pay

## 2022-01-19 VITALS — BP 137/88 | HR 66 | Temp 97.3°F | Resp 16 | Wt 206.3 lb

## 2022-01-19 DIAGNOSIS — M19011 Primary osteoarthritis, right shoulder: Secondary | ICD-10-CM

## 2022-01-19 DIAGNOSIS — M25519 Pain in unspecified shoulder: Secondary | ICD-10-CM

## 2022-01-19 DIAGNOSIS — M4722 Other spondylosis with radiculopathy, cervical region: Secondary | ICD-10-CM

## 2022-01-19 DIAGNOSIS — M5412 Radiculopathy, cervical region: Secondary | ICD-10-CM

## 2022-01-19 DIAGNOSIS — M47812 Spondylosis without myelopathy or radiculopathy, cervical region: Secondary | ICD-10-CM

## 2022-01-19 NOTE — H&P (Signed)
Physical Medicine and Rehabilitation New Patient Visit  Patient Name: Sean Wall  MRN: [id number]  DOB: [birth year]  DOS: 01/19/2022    Chief Complaint:   Chief Complaint   Patient presents with    Back Pain    Shoulder Pain    Neck Pain     Per patient     History of Present Illness: Sean Wall is a pleasant 66 y.o.male who presents to [place] for initial evaluation for the above stated complaint. The patient complains of right shoulder pain and neck pain. The pain is exacerbated with movement, but improves with rest. Pain scale: 8/10. The patient denies sxs of fever, chills, night sweats, weight changes, chest pain, shortness of breath, appetite changes, bowel or bladder incontinence, saddle paresthesias, headaches, dizziness, sleep changes, and all other complaints at this time. PMHx includes: headache, HTN, and ischemic colitis. PSHx includes: appendectomy, carpal tunnel release, hernia repair, lumbar fusion, and shoulder surgery. SHx includes: light tobacco smoker. The patient denies a personal history of cancer.     Past Medical History:  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CMS HCC)      Past Surgical History:  Past Surgical History:   Procedure Laterality Date    COLONOSCOPY  04/09/2021    HX APPENDECTOMY      HX CARPAL TUNNEL RELEASE Bilateral     HX HERNIA REPAIR      HX LUMBAR FUSION      HX SHOULDER SURGERY Bilateral      Social History:  Social History     Socioeconomic History    Marital status: Married     Spouse name: Not on file    Number of children: Not on file    Years of education: Not on file    Highest education level: Not on file   Occupational History    Not on file   Tobacco Use    Smoking status: Light Smoker     Types: Cigars    Smokeless tobacco: Never    Tobacco comments:     OCCASIONAL  SMOKING-1 CIGAR EVERY 2 WEEKS   Vaping Use    Vaping Use: Never used   Substance and Sexual Activity    Alcohol use: Yes     Alcohol/week: 21.0 standard drinks     Types: 21 Cans of beer per week     Comment: 2 drinks daily    Drug use: Never    Sexual activity: Not on file   Other Topics Concern    Not on file   Social History Narrative    Not on file     Social Determinants of Health     Financial Resource Strain: Not on file   Transportation Needs: Not on file   Social Connections: Not on file   Intimate Partner Violence: Not on file   Housing Stability: Not on file     Family History:  Family Medical History:       Problem Relation (Age of Onset)    No Known Problems Mother, Father, Sister, Brother, Half-Sister, Half-Brother, Maternal Aunt, Maternal Uncle, Paternal Aunt, Paternal Uncle, Maternal Grandmother, Maternal Grandfather, Paternal Grandmother, Paternal 8, Daughter, Son, Other  Medications:  Outpatient Medications Marked as Taking for the 01/19/22 encounter (Office Visit) with Marciano Sequin, MD   Medication Sig    atorvastatin (LIPITOR) 40 mg Oral Tablet Take 1 Tablet (40 mg total) by mouth Every evening    azelastine-fluticasone 137-50 mcg/spray Nasal Spray, Non-Aerosol Administer 2 Sprays into affected nostril(s) Every night    buPROPion (WELLBUTRIN XL) 300 mg extended release 24 hr tablet Take 1 Tablet (300 mg total) by mouth Once a day    cetirizine (ZYRTEC) 10 mg Oral Tablet Take 1 Tablet (10 mg total) by mouth Once a day    cyanocobalamin (VITAMIN B12) 1,000 mcg/mL Injection Solution 1 mL (1,000 mcg total) Every 30 days    cyclobenzaprine (FLEXERIL) 10 mg Oral Tablet Take 1 Tablet (10 mg total) by mouth Three times a day as needed    ferrous sulfate (FERATAB) 324 mg (65 mg iron) Oral Tablet, Delayed Release (E.C.) Take 1 Tablet (324 mg total) by mouth Every other day On Monday, Wednesday, friday    finasteride (PROSCAR) 5 mg Oral Tablet Take 1 Tablet (5 mg total) by mouth Once a  day    gabapentin (NEURONTIN) 300 mg Oral Capsule Take 1 Capsule (300 mg total) by mouth Three times a day    HERBAL DRUGS ORAL Take 700 mg by mouth Three times a day PEPPERMINT LEAF    HYDROcodone-acetaminophen (NORCO) 5-325 mg Oral Tablet Take 1 Tablet by mouth Every 8 hours as needed for Pain    Ibuprofen (MOTRIN) 600 mg Oral Tablet Take 1 Tablet (600 mg total) by mouth Four times a day as needed for Pain    lactobacillus comb no.10 (PROBIOTIC) 20 billion cell Oral Capsule Take by mouth    melatonin 3 mg Oral Capsule Take 2 Capsules (6 mg total) by mouth Every night    metoprolol tartrate (LOPRESSOR) 25 mg Oral Tablet Take 1 Tablet (25 mg total) by mouth Once a day    pantoprazole (PROTONIX) 20 mg Oral Tablet, Delayed Release (E.C.) Take 1 Tablet (20 mg total) by mouth Every morning before breakfast    QUEtiapine (SEROQUEL) 50 mg Oral Tablet Take 1 Tablet (50 mg total) by mouth Once a day    simethicone (MYLICON) 80 mg Oral Tablet, Chewable Chew 1 Tablet (80 mg total) Every 6 hours as needed    sumatriptan succinate (IMITREX) 100 mg Oral Tablet Take 1 Tablet (100 mg total) by mouth Once, as needed for Migraine May repeat in 2 hours in needed    tamsulosin (FLOMAX) 0.4 mg Oral Capsule Take 1 Capsule (0.4 mg total) by mouth Every evening after dinner    Testosterone 1 % (25 mg/2.5gram) Transdermal Gel in Packet Place 2 Each on the skin Once a day 2 pumps each shoulder daily am     Allergies:  Allergies   Allergen Reactions    Propofol Rash    Lithium      Blurred vision     Review of Systems:   Constitutional: Negative for fever, chills, night sweats, or weight changes.   Eyes: Negative for vision changes.  Cardiovascular: Negative for chest pain or palpitations.  Respiratory: Negative for shortness of breath.  Gastrointestinal: Negative for abdominal pain, nausea, or vomiting.  Genitourinary: Negative for bladder incontinence.   Musculoskeletal: Positive for shoulder pain, neck pain.  Neurological: Negative for  bowel or bladder incontinence, saddle paresthesias, numbness, tingling, headaches, or dizziness.   Skin: Negative for rash.  Psychiatric: Negative for sleep changes.   All other systems  reviewed and are negative. Please see scanned new patient packet.    Physical Examination:  BP 137/88   Pulse 66   Temp 36.3 C (97.3 F)   Resp 16   Wt 93.6 kg (206 lb 4.8 oz)   SpO2 96%   BMI 29.60 kg/m     Constitutional: 66 y.o. male in no acute distress. Awake. Cooperative. Alert and oriented x4.   HEENT: Normal oropharynx. Extra ocular movements intact. Normocephalic and atraumatic.   Neck: Supple, symmetric, trachea midline, no masses. The thyroid appears normal, no thyromegaly.   Cardiovascular: Regular rate and rhythm. No murmurs, rubs, or gallops. Normal pulses in all four extremities.  Respiratory: Lungs are clear to auscultation. No wheezing, rales, or rhonchi.  Gastrointestinal: Abdomen is soft and non-tender with normal bowel sounds. No hepatosplenomegaly.   Skin: Warm, dry, and no rashes.   Lymphatic: No palpable lymphadenopathy.  Neuromuscular: Alert and oriented x3 with normal speech. Attention span and concentration normal. Cognitive function is normal. Coordination is normal. Cranial nerves 2-12 intact. Sensation is normal in upper and lower extremities. Normal muscle tone in upper and lower extremities.   Gait: Gait Patterns: Normal  Shoulder Exam: Patient has tenderness in the right shoulder, otherwise no masses, no deformity, no atrophy effusion or erythema. Range of motion: Patient is able to forward flex the shoulder to 180 and abduction is to 180, Internal rotation is to the T7 spinous process, External rotation is 60. Stability: No laxity. Neer's impingement test positive, right.   Cervical Spine: Patient has tenderness in the neck, otherwise no masses, no deformity, no atrophy effusion or erythema. Spurling's test is positive. ROM: Flexion is within normal limits. Extension, rotation, and lateral  bending are limited, right > left. Stability: no step off, no atlantoaxial instability.   Motor examination:   Arm Right Left Leg Right Left   Shoulder abduction (C5) 5/5 5/5 Hip flexion (L2) 5/5 5/5   Wrist extension (C6) 5/5 5/5 Knee extension (L3) 5/5 5/5   Wrist flexion (C7) 5/5 5/5 Foot Dorsi Flexion (L4) 5/5 5/5   Finger flexion (C8) 5/5 5/5 Toe extension (L5) 5/5 5/5   Finger ab/adduction (T1) 5/5 5/5 Foot plantar flexion (S1) 5/5 5/5   Reflexes:    Bicep BR Triceps Patella Achilles Babinski Ankle Clonus Hoffman's   Right 2+ 2+ 2+ 2+ 2+ Downgoing Not present Not present   Left 2+ 2+ 2+ 2+ 2+ Downgoing Not present Not present   The patient was fully assessed, evaluated and examined today.    Diagnostic Studies:   01/07/2022 XR SHOULDER RIGHT  Findings/impression: Normal alignment. No acute fracture or osseous lesion. A few rounded ossific densities project about the acromioclavicular joint which demonstrates mild degenerative changes. Moderate degenerative changes of the glenohumeral joint. Soft tissues are without acute finding.    Assessment and Plan:    ICD-10-CM    1. DJD of right shoulder  M19.011 XR CERVICAL SPINE AP AND LAT W FLEX EXT     MRI SPINE CERVICAL WO CONTRAST      2. Shoulder pain, unspecified chronicity, unspecified laterality  M25.519 XR CERVICAL SPINE AP AND LAT W FLEX EXT     MRI SPINE CERVICAL WO CONTRAST      3. Cervical spondylosis  M47.812 XR CERVICAL SPINE AP AND LAT W FLEX EXT     MRI SPINE CERVICAL WO CONTRAST      4. Cervical radiculopathy  M54.12 XR CERVICAL SPINE AP AND LAT W FLEX EXT  MRI SPINE CERVICAL WO CONTRAST         Obtaining xray of cervical spine today. Ordering MRI of cervical spine.  Will follow up after MRI, unless otherwise noted. Patient expressed understanding of this plan and has no further questions at this time.     I am scribing for, and in the presence of, Peter Garter, MD for services provided on 01/19/2022.  Ulis Rias, SCRIBE     I personally performed  the services described in this documentation, as scribed  in my presence, and it is both accurate  and complete.    Marciano Sequin, MD      Peter Garter, MD  Medical Director, Spine and [place] Department of Neurosurgery

## 2022-02-07 ENCOUNTER — Encounter (HOSPITAL_BASED_OUTPATIENT_CLINIC_OR_DEPARTMENT_OTHER): Payer: Self-pay | Admitting: Gastroenterology

## 2022-02-07 ENCOUNTER — Telehealth (HOSPITAL_BASED_OUTPATIENT_CLINIC_OR_DEPARTMENT_OTHER): Payer: Self-pay | Admitting: Physician Assistant

## 2022-02-07 ENCOUNTER — Other Ambulatory Visit: Payer: Self-pay

## 2022-02-07 ENCOUNTER — Ambulatory Visit: Payer: 59 | Attending: Gastroenterology | Admitting: Gastroenterology

## 2022-02-07 VITALS — BP 130/70 | Ht 70.0 in | Wt 208.3 lb

## 2022-02-07 DIAGNOSIS — R109 Unspecified abdominal pain: Secondary | ICD-10-CM | POA: Insufficient documentation

## 2022-02-07 DIAGNOSIS — Z86010 Personal history of colonic polyps: Secondary | ICD-10-CM | POA: Insufficient documentation

## 2022-02-07 MED ORDER — PEG 3350-ELECTROLYTES 236 GRAM-22.74 GRAM-6.74 GRAM-5.86 GRAM SOLUTION
4.0000 L | Freq: Once | ORAL | 0 refills | Status: AC
Start: 2022-02-07 — End: 2022-02-07

## 2022-02-07 MED ORDER — BISACODYL 5 MG TABLET,DELAYED RELEASE
10.0000 mg | DELAYED_RELEASE_TABLET | ORAL | 0 refills | Status: DC | PRN
Start: 2022-02-07 — End: 2023-11-29

## 2022-02-07 NOTE — Nursing Note (Signed)
Per provider portal with [facility], [place] for Colonoscopy scheduled 03/15/22 w/Dr. Shawnie Pons has been approved & is valid 03/15/22 through 06/13/22. PA # [id number].    Daymon Larsen, LPN 47/99/87 21:58

## 2022-02-07 NOTE — Telephone Encounter (Signed)
Patient is rescheduling MRI and will call back to schedule follow

## 2022-02-07 NOTE — Progress Notes (Signed)
[facility]  Patient ID: Sean Wall is a 66 y.o. male.   PCP: [name], MD   Chief complaint: Change in bowels, IBS, ischemic colitis     History of Presenting Illness     This is a 66 year old man past medical history of GERD, IBS, hypertension, migraine, depression who presents here today for follow-up. 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  Pathology was consistent with ischemic colitis.  Since his discharge he has been feeling well with no new complaints.  Patient has a baseline of abdominal discomfort and diarrhea which is chronic and has been going on for many years which he attributes to his IBS. Bowel habits are mainly diarrhea. Had a formed bowel motion last night the first time. He had an image on his phone which shows thin caliber formed stool.     Family History of Colon Polyps/Colon Cancer/GI Malignancies: None    Endoscopy History:   Colonoscopy 04/08/21    Non bleeding small internal hemorrhoids  Mucosal changes suggestive of ischemic colitis of the transverse colon and descending colon.  Normal terminal ileum mucosa  Boston Bowel Prep Score (BBPS)7    Multiple EGDs in the past (7)    Review of Systems   Constitutional: Negative for activity change, appetite change, chills and diaphoresis.   HENT: Negative for congestion.    Eyes: Negative for discharge.   Respiratory: Negative for apnea, cough, choking, chest tightness, shortness of breath, wheezing and stridor.    Cardiovascular: Negative for chest pain, palpitations and leg swelling.   Gastrointestinal: Negative for abdominal distention, abdominal pain, anal bleeding, blood in stool, constipation, diarrhea, nausea, rectal pain and vomiting.   Endocrine: Negative for cold intolerance.   Genitourinary: Negative for difficulty urinating.   Musculoskeletal: Negative for arthralgias.   Skin: Negative for color  change and pallor.   Allergic/Immunologic: Negative for immunocompromised state.   Neurological: Negative for dizziness.   Psychiatric/Behavioral: Negative for behavioral problems and confusion.      All 14 ROS has been reviewed and negative except for what has been stated in the HPI.     Past Medical History:   Diagnosis Date   . Acid reflux    . Enlarged prostate    . Erectile dysfunction    . Headache    . Hemorrhoids    . HTN (hypertension)    . Ischemic colitis (CMS [facility])           Past Surgical History:   Procedure Laterality Date   . COLONOSCOPY  04/09/2021   . HX APPENDECTOMY     . HX CARPAL TUNNEL RELEASE Bilateral    . HX HERNIA REPAIR     . HX LUMBAR FUSION     . HX SHOULDER SURGERY Bilateral           Family Medical History:     Problem Relation (Age of Onset)    No Known Problems Mother, Father, Sister, Brother, Half-Sister, Half-Brother, Maternal Aunt, Maternal Uncle, Paternal 82, Paternal Uncle, Maternal Grandmother, Maternal Grandfather, Paternal 38, Paternal 43, Daughter, Son, Other           Social History     Socioeconomic History   . Marital status: Married   Tobacco Use   . Smoking status: Light Smoker     Types: Cigars   . Smokeless tobacco: Never   . Tobacco comments:  OCCASIONAL SMOKING-1 CIGAR EVERY 2 WEEKS   Vaping Use   . Vaping Use: Never used   Substance and Sexual Activity   . Alcohol use: Yes     Alcohol/week: 21.0 standard drinks     Types: 21 Cans of beer per week     Comment: 2 drinks daily   . Drug use: Never      Allergies   Allergen Reactions   . Propofol Rash   . Lithium      Blurred vision        Current Outpatient Medications:   .  atorvastatin (LIPITOR) 40 mg Oral Tablet, Take 1 Tablet (40 mg total) by mouth Every evening, Disp: , Rfl:   .  azelastine-fluticasone 137-50 mcg/spray Nasal Spray, Non-Aerosol, Administer 2 Sprays into affected nostril(s) Every night, Disp: , Rfl:   .  buPROPion (WELLBUTRIN XL) 300 mg extended release 24 hr tablet, Take 1  Tablet (300 mg total) by mouth Once a day, Disp: , Rfl:   .  cetirizine (ZYRTEC) 10 mg Oral Tablet, Take 1 Tablet (10 mg total) by mouth Once a day, Disp: , Rfl:   .  cyanocobalamin (VITAMIN B12) 1,000 mcg/mL Injection Solution, 1 mL (1,000 mcg total) Every 30 days, Disp: , Rfl:   .  cyclobenzaprine (FLEXERIL) 10 mg Oral Tablet, Take 1 Tablet (10 mg total) by mouth Three times a day as needed, Disp: , Rfl:   .  ferrous sulfate (FERATAB) 324 mg (65 mg iron) Oral Tablet, Delayed Release (E.C.), Take 1 Tablet (324 mg total) by mouth Every other day On Monday, Wednesday, friday, Disp: , Rfl:   .  finasteride (PROSCAR) 5 mg Oral Tablet, Take 1 Tablet (5 mg total) by mouth Once a day, Disp: , Rfl:   .  gabapentin (NEURONTIN) 300 mg Oral Capsule, Take 1 Capsule (300 mg total) by mouth Three times a day, Disp: , Rfl:   .  HERBAL DRUGS ORAL, Take 700 mg by mouth Three times a day PEPPERMINT LEAF, Disp: , Rfl:   .  HYDROcodone-acetaminophen (NORCO) 5-325 mg Oral Tablet, Take 1 Tablet by mouth Every 8 hours as needed for Pain, Disp: , Rfl:   .  Ibuprofen (MOTRIN) 600 mg Oral Tablet, Take 1 Tablet (600 mg total) by mouth Four times a day as needed for Pain, Disp: , Rfl:   .  lactobacillus comb no.10 (PROBIOTIC) 20 billion cell Oral Capsule, Take by mouth, Disp: , Rfl:   .  melatonin 3 mg Oral Capsule, Take 2 Capsules (6 mg total) by mouth Every night, Disp: , Rfl:   .  metoprolol tartrate (LOPRESSOR) 25 mg Oral Tablet, Take 1 Tablet (25 mg total) by mouth Once a day, Disp: , Rfl:   .  pantoprazole (PROTONIX) 20 mg Oral Tablet, Delayed Release (E.C.), Take 1 Tablet (20 mg total) by mouth Every morning before breakfast, Disp: , Rfl:   .  QUEtiapine (SEROQUEL) 50 mg Oral Tablet, Take 1 Tablet (50 mg total) by mouth Once a day, Disp: , Rfl:   .  simethicone (MYLICON) 80 mg Oral Tablet, Chewable, Chew 1 Tablet (80 mg total) Every 6 hours as needed, Disp: , Rfl:   .  sumatriptan succinate (IMITREX) 100 mg Oral Tablet, Take 1 Tablet  (100 mg total) by mouth Once, as needed for Migraine May repeat in 2 hours in needed, Disp: , Rfl:   .  tamsulosin (FLOMAX) 0.4 mg Oral Capsule, Take 1 Capsule (0.4 mg total) by mouth Every  evening after dinner, Disp: , Rfl:   .  Testosterone 1 % (25 mg/2.5gram) Transdermal Gel in Packet, Place 2 Each on the skin Once a day 2 pumps each shoulder daily am, Disp: , Rfl:      Objective:   Vitals:    02/07/22 0753   BP: 130/70   Weight: 94.5 kg (208 lb 5.4 oz)   Height: 1.778 m ('5\' 10"'$ )   BMI: 29.96          Body mass index is 29.89 kg/m.   Physical Exam  Vitals and nursing note reviewed.   Constitutional:       Appearance: Normal appearance.   HENT:      Head: Normocephalic and atraumatic.      Nose: Nose normal.      Mouth/Throat:      Mouth: Mucous membranes are moist.   Eyes:      Extraocular Movements: Extraocular movements intact.      Pupils: Pupils are equal, round, and reactive to light.   Cardiovascular:      Rate and Rhythm: Normal rate and regular rhythm.   Pulmonary:      Effort: Pulmonary effort is normal. No respiratory distress.   Abdominal:      General: Abdomen is flat.      Palpations: Abdomen is soft.   Musculoskeletal:         General: Normal range of motion.      Cervical back: Normal range of motion.   Skin:     General: Skin is warm.   Neurological:      General: No focal deficit present.      Mental Status: He is alert and oriented to person, place, and time. Mental status is at baseline.   Psychiatric:         Mood and Affect: Mood normal.         Behavior: Behavior normal.          Labs/Radiology   No results were found from the past 30 days.  No results found for this or any previous visit (from the past 1008 hour(s)).    Assessment:    This is a 66 year old man past medical history of GERD, IBS, hypertension, migraine, depression who presents here today for follow-up. Patient had a colonoscopy while inpatient and was found to have ischemic colitis involving the splenic flexure distal  transverse and descending colon.  Pathology was consistent with ischemic colitis.  Since his discharge he has been feeling well with no new complaints.  Patient has a baseline of abdominal discomfort and diarrhea which is chronic and has been going on for many years which he attributes to his IBS. Bowel habits are mainly diarrhea. Had a formed bowel motion last night the first time. He had an image on his phone which shows thin caliber formed stool.     Antithrombotics/Anticoagulants:  None  Relevant Surgical History:  None  Cardio-Pulm History/Clearance:  None    Plan:     1. Ischemic colitis  Resolved  Feeling well    2. IBS - Mixed type  Low FODMAP diet  Imodium as needed  Fiber supplement    3. CRC Screening:  Colonoscopy 03/2021 - no polyps  Ischemic colitis  Patient is in agreement with colonoscopy   Colonoscopy prep with golytely  Instructions explained and hard copy given to the patient who verbalized understanding  Clearances as noted  Withold anticoagulants and anti-platelets as needed      4.  HCV Screening:  USPTF recommends one time screening for HCV in adults age 34-79  To be done by PCP    Follow up as needed    Follow up after procedure as needed      ICD-10-CM    1. History of colon polyps  Z86.010 bisacodyL (DULCOLAX) 5 mg Oral Tablet, Delayed Release (E.C.)     PEG 3350-Electrolytes 236-22.74-6.74 -5.86 gram Oral Recon Soln      2. Abdominal pain, unspecified abdominal location  R10.9           I am scribing for, and in the presence of, Dr. Brooks Sailors for services provided on 02/07/2022.  Everlene Balls, SCRIBE    Everlene Balls, [place] 02/07/2022    I personally performed the services described in this documentation, as scribed  in my presence, and it is both accurate  and complete.    Brooks Sailors, MD

## 2022-02-09 ENCOUNTER — Other Ambulatory Visit (INDEPENDENT_AMBULATORY_CARE_PROVIDER_SITE_OTHER): Payer: Self-pay | Admitting: Physician Assistant

## 2022-02-09 ENCOUNTER — Ambulatory Visit (INDEPENDENT_AMBULATORY_CARE_PROVIDER_SITE_OTHER): Payer: Self-pay | Admitting: Physician Assistant

## 2022-02-09 DIAGNOSIS — N529 Male erectile dysfunction, unspecified: Secondary | ICD-10-CM

## 2022-02-09 MED ORDER — TADALAFIL 10 MG TABLET
10.0000 mg | ORAL_TABLET | ORAL | 11 refills | Status: AC | PRN
Start: 2022-02-09 — End: 2023-02-09

## 2022-02-11 ENCOUNTER — Ambulatory Visit (INDEPENDENT_AMBULATORY_CARE_PROVIDER_SITE_OTHER): Payer: Self-pay | Admitting: Urology

## 2022-02-11 NOTE — Nursing Note (Signed)
Message from [place] sent at 02/11/2022 3:18 PM EDT    Sean Wall pt   Pt states he was supposed to have a change in his medication, he states it has not been changed. Please return his call        Call History     Type Contact Phone/Fax User   02/11/2022 03:16 PM EDT Phone ([address]) Amos, Micheals (Self) [phone] Jerilynn Mages) Dessie Coma         RN f/u with pt. Pt misunderstood that pt was increased to 10 mg tablets. Pt said he needs 60 tabs. RN explained that Cialis comes in 10 mg tablets. Pt also requesting this be sent to [facility] in [place]. RN  Called [facility] and had pharmacy transfer the script to [facility]. Pt reports he will discuss with DR Sean Wall on next visit if he can have a 90 day supply.     [place], CLINICAL CARE COORDINATOR

## 2022-02-16 ENCOUNTER — Ambulatory Visit (HOSPITAL_COMMUNITY): Payer: Self-pay

## 2022-02-18 ENCOUNTER — Ambulatory Visit (HOSPITAL_BASED_OUTPATIENT_CLINIC_OR_DEPARTMENT_OTHER): Payer: Self-pay | Admitting: Student in an Organized Health Care Education/Training Program

## 2022-02-25 ENCOUNTER — Ambulatory Visit (HOSPITAL_COMMUNITY): Payer: Self-pay

## 2022-03-02 ENCOUNTER — Ambulatory Visit: Payer: 59 | Attending: Physical Medicine & Rehabilitation

## 2022-03-02 ENCOUNTER — Other Ambulatory Visit: Payer: Self-pay

## 2022-03-02 ENCOUNTER — Inpatient Hospital Stay (HOSPITAL_COMMUNITY)
Admission: RE | Admit: 2022-03-02 | Discharge: 2022-03-02 | Disposition: A | Discharge status: Home / Self Care | Payer: 59 | Source: Ambulatory Visit | Attending: Physical Medicine & Rehabilitation | Admitting: Physical Medicine & Rehabilitation

## 2022-03-02 ENCOUNTER — Encounter (HOSPITAL_BASED_OUTPATIENT_CLINIC_OR_DEPARTMENT_OTHER): Payer: Self-pay

## 2022-03-02 VITALS — BP 120/65 | HR 48 | Temp 97.5°F | Resp 12 | Ht 70.0 in | Wt 209.9 lb

## 2022-03-02 DIAGNOSIS — M47812 Spondylosis without myelopathy or radiculopathy, cervical region: Secondary | ICD-10-CM

## 2022-03-02 DIAGNOSIS — M5412 Radiculopathy, cervical region: Secondary | ICD-10-CM | POA: Insufficient documentation

## 2022-03-02 DIAGNOSIS — M5030 Other cervical disc degeneration, unspecified cervical region: Secondary | ICD-10-CM

## 2022-03-02 DIAGNOSIS — M19011 Primary osteoarthritis, right shoulder: Secondary | ICD-10-CM | POA: Insufficient documentation

## 2022-03-02 DIAGNOSIS — M25519 Pain in unspecified shoulder: Secondary | ICD-10-CM

## 2022-03-02 DIAGNOSIS — M5010 Cervical disc disorder with radiculopathy, unspecified cervical region: Secondary | ICD-10-CM

## 2022-03-02 DIAGNOSIS — M4802 Spinal stenosis, cervical region: Secondary | ICD-10-CM | POA: Insufficient documentation

## 2022-03-02 NOTE — Progress Notes (Signed)
Physical Medicine and Rehabilitation Return Patient Visit  Patient Name: Sean Wall  MRN: [id number]  DOB: 1956/01/24  DOS: 03/02/2022    Chief Complaint:   Chief Complaint   Patient presents with   . MRI Results     03/02/22 @ [facility]       History of Present Illness: Sean Wall is a pleasant 66 y.o.male who presents to [place] for return follow up evaluation. Patient was last seen on 01/19/2022. Today, the patient comes in after obtaining x-ray and MRI of the cervical spine without contrast. The pain is exacerbated with generalized movements, lifting, over exertion, and ADLs, but improves with rest and medications such as Flexeril, Neurontin, Norco, and Motrin with minimal relief. Pain scale: 8/10. Patient is accompanied with sxs of cervical neck pain radiates into the right shoulder. The patient denies fever, chills, night sweats, weight changes, chest pain, shortness of breath, appetite changes, bowel or bladder incontinence, saddle paresthesias, headaches, dizziness, sleep changes, and all other complaints at this time.    Past medical history:  Past Medical History:   Diagnosis Date   . Acid reflux    . Enlarged prostate    . Erectile dysfunction    . Headache    . Hemorrhoids    . HTN (hypertension)    . Ischemic colitis (CMS [place])            Family medical history:  Family Medical History:     Problem Relation (Age of Onset)    No Known Problems Mother, Father, Sister, Brother, Half-Sister, Half-Brother, Maternal 41, Maternal Uncle, Paternal 48, Paternal Uncle, Maternal Grandmother, Maternal Grandfather, Paternal Grandmother, Paternal 76, Daughter, Son, Other            Past surgical history:  Past Surgical History:   Procedure Laterality Date   . COLONOSCOPY  04/09/2021   . HX APPENDECTOMY     . HX CARPAL TUNNEL RELEASE Bilateral    . HX HERNIA REPAIR     . HX LUMBAR FUSION     . HX SHOULDER SURGERY Bilateral            Social  history:  Social History     Socioeconomic History   . Marital status: Married     Spouse name: Not on file   . Number of children: Not on file   . Years of education: Not on file   . Highest education level: Not on file   Occupational History   . Not on file   Tobacco Use   . Smoking status: Light Smoker     Types: Cigars   . Smokeless tobacco: Never   . Tobacco comments:     OCCASIONAL SMOKING-1 CIGAR EVERY 2 WEEKS   Vaping Use   . Vaping Use: Never used   Substance and Sexual Activity   . Alcohol use: Yes     Alcohol/week: 21.0 standard drinks     Types: 21 Cans of beer per week     Comment: 2 drinks daily   . Drug use: Never   . Sexual activity: Not on file   Other Topics Concern   . Not on file   Social History Narrative   . Not on file     Social Determinants of Health     Financial Resource Strain: Not on file   Transportation Needs: Not on file   Social Connections: Not on file   Intimate  Partner Violence: Not on file   Housing Stability: Not on file        Medications:  No outpatient medications have been marked as taking for the 03/02/22 encounter (Office Visit) with Marybelle Killings, FNP-C.       Allergies:  Allergies   Allergen Reactions   . Propofol Rash   . Lithium      Blurred vision       Review of Systems:   Constitutional: Negative for fever, chills, night sweats, or weight changes.   Eyes: Negative for vision changes.  Cardiovascular: Negative for chest pain or palpitations.  Respiratory: Negative for shortness of breath.  Gastrointestinal: Negative for abdominal pain, nausea, or vomiting.  Genitourinary: Negative for bladder incontinence.   Neurological: Negative for bowel or bladder incontinence, saddle paresthesias, numbness, tingling, headaches, or dizziness.   Skin: Negative for rash.  Psychiatric: Negative for sleep changes.   All other systems reviewed and are negative.     Physical Examination:  BP 120/65   Pulse 48   Temp 36.4 C (97.5 F) (Tympanic)   Resp 12   Ht 1.778 m ('5\' 10"'$ )    Wt 95.2 kg (209 lb 14.1 oz)   SpO2 96%   BMI 30.11 kg/m       Constitutional: 66 y.o. male in no acute distress. Awake. Cooperative. Alert and oriented x4.   HEENT: Unremarkable  Neck: Supple, symmetric, trachea midline, no masses. The thyroid appears normal, no thyromegaly.   Respiratory: No Respiratory distress.  Skin: Warm, dry, and no rashes.   Neuromuscular: Alert and oriented x3 with normal speech. Attention span and concentration normal. Cognitive function is normal. Coordination is normal. Cranial nerves 2-12 intact. Sensation is intact in upper and lower extremities. Normal muscle tone in upper and lower extremities.      Gait: Gait Patterns: Normal   Cervical Spine: Patient has tenderness in the C5-C7 areas. Spurling's test is positive, right. ROM: Flexion within normal limits, however, extension, rotation, and lateral bending are limited, right. Stability: no step off, no atlantoaxial instability.    Shoulder Exam: Patient has tenderness in the right shoulder. Range of motion: Patient is able to forward flex the shoulder to 180 and  Abduction is to 180, Internal rotation is to the T7 spinous process, External rotation is 60. Stability: No laxity.      Motor examination:   Arm Right Left Leg Right Left   Shoulder abduction (C5) 4+/5 5/5 Hip flexion (L2) 5/5 5/5   Wrist extension (C6) 5/5 5/5 Knee extension (L3) 5/5 5/5   Wrist flexion (C7) 5/5 5/5 Foot Dorsi Flexion (L4) 5/5 5/5   Finger flexion (C8) 5/5 5/5 Toe extension (L5) 5/5 5/5   Finger ab/adduction (T1) 5/5 5/5 Foot plantar flexion (S1) 5/5 5/5    Reflexes:    Bicep BR Triceps Patella Achilles Babinski Ankle Clonus Hoffman's   Right 2+ 2+ 2+ 2+ 2+ Downgoing Not present Not present   Left 2+ 2+ 2+ 2+ 2+ Downgoing Not present Not present   The patient was fully assessed, evaluated and examined today.    Patient was seen independently with supervising MD present in clinic.     Diagnostic Studies Reviewed:     Discussed with patient,  findings and provided education regarding the following diagnostic studies:    XR CERVICAL SPINE AP AND LAT W/FLEX EXT 03/02/2022  There is straightening of normal cervical lordosis. Vertebral body heights are preserved with no evidence of acute fracture or dislocation. Mild  loss of vertebral disc height is seen at C4-C5 and C5-C6. No intersegmental hypermobility is seen with flexion or extension. Mild uncovertebral osteophytes arthropathy is seen, primarily C4-C5 and C5-C6.    MRI SPINE CERVICAL WO CONTRAST 03/02/2022  There is normal alignment of cervical spine. No fracture or dislocation is seen, with vertebral body heights preserved. Bone marrow signal is within normal limits. Focal fat density of the C2 vertebral body is likely a small hemangioma.    At C2-C3 there is no spinal canal or neural foraminal stenosis.    At C3-C4 facet arthropathy results in mild bilateral neural foraminal narrowing. No spinal canal narrowing is seen.    At C4-C5 uncovertebral and facet arthropathy results in moderate left and mild right neural foraminal narrowing. A central left paracentral disc herniation results in mild spinal canal narrowing.    At C5-C6 there is central and paracentral zone disc herniation resulting in mild to moderate spinal canal narrowing. There is moderate bilateral neural foraminal narrowing due to uncovertebral and facet osteoarthropathy.    At C6-C7 there is moderate bilateral neural foraminal narrowing. No spinal canal narrowing is seen.    At C7-T1 there is no spinal canal narrowing. Mild bilateral neural foraminal narrowing is seen.    No abnormal signal is seen in the spinal cord. The soft tissues of the neck are unremarkable.    Assessment and Plan:    ICD-10-CM    1. Cervical radiculopathy  M54.12 EPIDURAL STEROID INJECTION - CERVICAL     CANCELED: EPIDURAL STEROID INJECTION - CERVICAL      2. DDD (degenerative disc disease), cervical  M50.30 EPIDURAL STEROID INJECTION - CERVICAL      CANCELED: EPIDURAL STEROID INJECTION - CERVICAL      3. Neural foraminal stenosis of cervical spine  M48.02 EPIDURAL STEROID INJECTION - CERVICAL     CANCELED: EPIDURAL STEROID INJECTION - CERVICAL      4. Cervical stenosis of spinal canal  M48.02 EPIDURAL STEROID INJECTION - CERVICAL     CANCELED: EPIDURAL STEROID INJECTION - CERVICAL         Discussed and advised to continue utilizing pharmacological and nonpharmacological interventions for pain management. Discussed conservative treatment, such as injections. Patient is willing to attempt treatment. Therefore, we will provide an order for CESI at the C6-7 level as soon as possible, pending insurance approval. Will follow up after injection, unless otherwise noted. Patient expressed understanding of this plan and has no further questions at this time.    Marybelle Killings, APRN, FNP-C  Nurse Practitioner, Sp[facility] and [facility] Department of Neurosurgery    This note was partially generated using MModal Fluency Direct system, and there may be some incorrect words, spellings, and punctuation that were not noted in checking the note before saving.

## 2022-03-06 ENCOUNTER — Encounter (HOSPITAL_BASED_OUTPATIENT_CLINIC_OR_DEPARTMENT_OTHER): Payer: Self-pay

## 2022-03-10 ENCOUNTER — Encounter (INDEPENDENT_AMBULATORY_CARE_PROVIDER_SITE_OTHER): Payer: Self-pay | Admitting: Urology

## 2022-03-10 ENCOUNTER — Encounter (HOSPITAL_BASED_OUTPATIENT_CLINIC_OR_DEPARTMENT_OTHER): Payer: Self-pay

## 2022-03-10 ENCOUNTER — Ambulatory Visit: Payer: Medicare (Managed Care) | Attending: UROLOGY | Admitting: Urology

## 2022-03-10 ENCOUNTER — Encounter (HOSPITAL_COMMUNITY): Payer: Self-pay

## 2022-03-10 ENCOUNTER — Other Ambulatory Visit: Payer: Self-pay

## 2022-03-10 ENCOUNTER — Inpatient Hospital Stay
Admission: RE | Admit: 2022-03-10 | Discharge: 2022-03-10 | Disposition: A | Discharge status: Home / Self Care | Payer: Medicare (Managed Care) | Source: Ambulatory Visit | Attending: Gastroenterology | Admitting: Gastroenterology

## 2022-03-10 VITALS — BP 126/90 | HR 59 | Temp 98.1°F | Ht 70.0 in | Wt 207.2 lb

## 2022-03-10 DIAGNOSIS — N401 Enlarged prostate with lower urinary tract symptoms: Secondary | ICD-10-CM

## 2022-03-10 DIAGNOSIS — N529 Male erectile dysfunction, unspecified: Secondary | ICD-10-CM

## 2022-03-10 DIAGNOSIS — N39491 Coital incontinence: Secondary | ICD-10-CM

## 2022-03-10 DIAGNOSIS — N400 Enlarged prostate without lower urinary tract symptoms: Secondary | ICD-10-CM | POA: Insufficient documentation

## 2022-03-10 DIAGNOSIS — N5314 Retrograde ejaculation: Secondary | ICD-10-CM

## 2022-03-10 DIAGNOSIS — R3915 Urgency of urination: Secondary | ICD-10-CM

## 2022-03-10 DIAGNOSIS — N138 Other obstructive and reflux uropathy: Secondary | ICD-10-CM

## 2022-03-10 HISTORY — DX: Sleep apnea, unspecified: G47.30

## 2022-03-10 HISTORY — DX: Tinnitus, unspecified ear: H93.19

## 2022-03-10 HISTORY — DX: Dependence on other enabling machines and devices: Z99.89

## 2022-03-10 HISTORY — DX: Personal history of other specified conditions: Z87.898

## 2022-03-10 HISTORY — DX: Hyperlipidemia, unspecified: E78.5

## 2022-03-10 MED ORDER — TADALAFIL 20 MG TABLET
20.0000 mg | ORAL_TABLET | ORAL | 4 refills | Status: DC | PRN
Start: 2022-03-10 — End: 2024-03-06

## 2022-03-10 NOTE — Progress Notes (Signed)
NAME :Sean Wall  AGE: 66 y.o.  DATE: 03/10/2022  SERVICE: Urology        Chief Complaint   Patient presents with    Benign Prostatic Hypertrophy       HPI (12/08/21):  This is a 66 y.o. male here for evaluation of ejaculatory dysfunction.  He gives a history of TURP in 2005 or 2006.  He states that this was a formal resection but he continued to have normal ejaculation.  He then began having some difficulties with ejaculation around 2016.  He now has rare ejaculations or orgasm.  He does describe some recent leakage of urine with climax.  He describes good quality erections.  He does relate that he was started on finasteride and tamsulosin about 2 years ago primarily for nocturia.  He has stopped Flomax and anejaculation persists  He states he also suffers from urinary urgency, sometimes leaking urine prior to making it to the bathroom on time.  He denies hematuria or dysuria.      Interval Update (03/10/22):  Patient is now 6 weeks status post cystoscopy cystometrogram.  At the time he was found to have normal capacity bladder with postvoid residual of 0 cc.  He was noted to have an enlarged median lobe however prostatic urethra was open status post transurethral resection.  He continues to have urinary urgency. He is still experiencing climacturia with retrograde ejaculation although this improves when he empties his bladder prior to sexual activity.       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    HTN (hypertension)     Hyperlipidemia     Ischemic colitis (CMS [place])     Sleep apnea     Tinnitus            Past Surgical History  Past Surgical History:   Procedure Laterality Date    COLONOSCOPY  04/09/2021    ELBOW SURGERY Left     HX APPENDECTOMY       HX CARPAL TUNNEL RELEASE Bilateral     HX LUMBAR FUSION      hardware in place    HX SHOULDER SURGERY Bilateral     5 surgeries on right and 3 on the left    HX TURP      HX VASECTOMY      INGUINAL HERNIA REPAIR Right     LASIK Bilateral              Allergies  Allergies   Allergen Reactions    Propofol Rash    Lithium      Blurred vision         Medications  atorvastatin (LIPITOR) 40 mg Oral Tablet, Take 1 Tablet (40 mg total) by mouth Every evening  azelastine-fluticasone 137-50 mcg/spray Nasal Spray, Non-Aerosol, Administer 2 Sprays into affected nostril(s) Every night  bisacodyL (DULCOLAX) 5 mg Oral Tablet, Delayed Release (E.C.), Take 2 Tablets (10 mg total) by mouth Every 24 hours as needed for Constipation for up to 1 dose To be taken in the evening  buPROPion (WELLBUTRIN XL) 300 mg extended release 24 hr tablet, Take 1 Tablet (300 mg total) by mouth  Once a day  cetirizine (ZYRTEC) 10 mg Oral Tablet, Take 1 Tablet (10 mg total) by mouth Once a day  cyanocobalamin (VITAMIN B12) 1,000 mcg/mL Injection Solution, 1 mL (1,000 mcg total) Every 30 days  cyclobenzaprine (FLEXERIL) 10 mg Oral Tablet, Take 1 Tablet (10 mg total) by mouth Three times a day as needed  ferrous sulfate (FERATAB) 324 mg (65 mg iron) Oral Tablet, Delayed Release (E.C.), Take 1 Tablet (324 mg total) by mouth Every other day On Monday, Wednesday, friday (Patient not taking: Reported on 03/10/2022)  finasteride (PROSCAR) 5 mg Oral Tablet, Take 1 Tablet (5 mg total) by mouth Once a day  gabapentin (NEURONTIN) 300 mg Oral Capsule, Take 1 Capsule (300 mg total) by mouth Three times a day  HERBAL DRUGS ORAL, Take 700 mg by mouth Three times a day PEPPERMINT LEAF  HYDROcodone-acetaminophen (NORCO) 5-325 mg Oral Tablet, Take 1 Tablet by mouth Every 8 hours as needed for Pain  Ibuprofen (MOTRIN) 600 mg Oral Tablet, Take 1 Tablet (600 mg total) by mouth Four times a day as needed for Pain  lactobacillus comb no.10 (PROBIOTIC) 20 billion cell Oral  Capsule, Take 1 Capsule by mouth Once a day  melatonin 3 mg Oral Capsule, Take 2 Capsules (6 mg total) by mouth Every night  metoprolol tartrate (LOPRESSOR) 25 mg Oral Tablet, Take 1 Tablet (25 mg total) by mouth Once a day  pantoprazole (PROTONIX) 20 mg Oral Tablet, Delayed Release (E.C.), Take 1 Tablet (20 mg total) by mouth Every morning before breakfast  QUEtiapine (SEROQUEL) 50 mg Oral Tablet, Take 1 Tablet (50 mg total) by mouth Every night  simethicone (MYLICON) 80 mg Oral Tablet, Chewable, Chew 1 Tablet (80 mg total) Every 6 hours as needed (Patient not taking: Reported on 03/10/2022)  sumatriptan succinate (IMITREX) 100 mg Oral Tablet, Take 1 Tablet (100 mg total) by mouth Once, as needed for Migraine May repeat in 2 hours in needed  tadalafil (CIALIS) 10 mg Oral Tablet, Take 1 Tablet (10 mg total) by mouth Every 24 hours as needed  tamsulosin (FLOMAX) 0.4 mg Oral Capsule, Take 1 Capsule (0.4 mg total) by mouth Every evening after dinner  Testosterone 1 % (25 mg/2.5gram) Transdermal Gel in Packet, Place 2 Each on the skin Once a day 2 pumps each shoulder daily am    No facility-administered medications prior to visit.      Past Social History  He is a English as a second language teacher.  He smokes cigars.  No illicit drug use.    Family History   Neg for GU malignancy      ROS:  Please see HPI.  All other systems were reviewed by me and are negative.                OBJECTIVE:  PHYSICAL EXAM:    BP (!) 126/90   Pulse 59   Temp 36.7 C (98.1 F)   Ht 1.778 m ('5\' 10"'$ )   Wt 94 kg (207 lb 3.7 oz)   SpO2 97%   BMI 29.73 kg/m       General:  NAD.  Vital signs reviewed.  GU:  No suprapubic tenderness      LABS:  He reports that his PSAs are checked through the [facility].  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 Retrograde ejaculation with climacturia  3. Erectile dysfunction,  mild  SHIM 18  4. Lower urinary tract symptoms  (moderate) with urinary urgency  AUA symptom score 12    PLAN:    -We would a long discussion regarding treatment options for climacturia.  At this point, in the absence of severe erectile dysfunction and/or severe stress urinary incontinence, surgical treatment options involving sling, mini jupette or artificial urethral sphincter would be far too aggressive  -Patient is to continue emptying his bladder prior to any sexual activity  -Will refer for pelvic floor physiotherapy  -We refill #30 Cialis 20 mg PRN, 4 refills   -RTC in one year      Tyna Jaksch, DO      I saw and examined the patient.  I reviewed the resident's note.  I agree with the findings and plan of care as documented in the resident's note.  Any exceptions/additions are edited/noted.    Additional Attending Documentation: None    Merian Capron, MD 03/14/2022, 10:42  Genitourinary Reconstructive Surgery  Assistant Professor - Department of Urology

## 2022-03-11 ENCOUNTER — Ambulatory Visit (HOSPITAL_BASED_OUTPATIENT_CLINIC_OR_DEPARTMENT_OTHER): Payer: Medicare (Managed Care) | Admitting: Student in an Organized Health Care Education/Training Program

## 2022-03-15 ENCOUNTER — Ambulatory Visit (HOSPITAL_COMMUNITY): Payer: Medicare (Managed Care) | Admitting: Anesthesiology

## 2022-03-15 ENCOUNTER — Encounter (HOSPITAL_COMMUNITY)
Admission: RE | Disposition: A | Discharge status: Home / Self Care | Payer: Self-pay | Source: Ambulatory Visit | Attending: Gastroenterology

## 2022-03-15 ENCOUNTER — Inpatient Hospital Stay
Admission: RE | Admit: 2022-03-15 | Discharge: 2022-03-15 | Disposition: A | Discharge status: Home / Self Care | Payer: Medicare (Managed Care) | Source: Ambulatory Visit | Attending: Gastroenterology | Admitting: Gastroenterology

## 2022-03-15 DIAGNOSIS — D122 Benign neoplasm of ascending colon: Secondary | ICD-10-CM

## 2022-03-15 DIAGNOSIS — D123 Benign neoplasm of transverse colon: Secondary | ICD-10-CM

## 2022-03-15 DIAGNOSIS — Z86010 Personal history of colonic polyps: Secondary | ICD-10-CM

## 2022-03-15 DIAGNOSIS — Z1211 Encounter for screening for malignant neoplasm of colon: Secondary | ICD-10-CM

## 2022-03-15 DIAGNOSIS — K640 First degree hemorrhoids: Secondary | ICD-10-CM

## 2022-03-15 DIAGNOSIS — R109 Unspecified abdominal pain: Secondary | ICD-10-CM | POA: Insufficient documentation

## 2022-03-15 SURGERY — COLONOSCOPY WITH POLYPECTOMY
Anesthesia: General | Wound class: Clean Contaminated Wounds-The respiratory, GI, Genital, or urinary

## 2022-03-15 MED ORDER — MIDAZOLAM 1 MG/ML INJECTION SOLUTION
Freq: Once | INTRAMUSCULAR | Status: DC | PRN
Start: 2022-03-15 — End: 2022-03-15
  Administered 2022-03-15: 550 mg via INTRAVENOUS
  Administered 2022-03-15 (×3): 2 mg via INTRAVENOUS

## 2022-03-15 MED ORDER — MIDAZOLAM 1 MG/ML INJECTION SOLUTION
INTRAMUSCULAR | Status: AC
Start: 2022-03-15 — End: 2022-03-15
  Filled 2022-03-15: qty 2

## 2022-03-15 MED ORDER — LACTATED RINGERS INTRAVENOUS SOLUTION
1000.0000 mL | INTRAVENOUS | Status: DC
Start: 2022-03-15 — End: 2022-03-15
  Administered 2022-03-15: 1000 mL via INTRAVENOUS

## 2022-03-15 MED ORDER — FENTANYL (PF) 50 MCG/ML INJECTION SOLUTION
INTRAMUSCULAR | Status: AC
Start: 2022-03-15 — End: 2022-03-15
  Filled 2022-03-15: qty 2

## 2022-03-15 MED ORDER — RACEPINEPHRINE 2.25 % SOLUTION FOR NEBULIZATION
0.5000 mL | INHALATION_SOLUTION | Freq: Once | RESPIRATORY_TRACT | Status: DC | PRN
Start: 2022-03-15 — End: 2022-03-15

## 2022-03-15 MED ORDER — FENTANYL (PF) 50 MCG/ML INJECTION SOLUTION
Freq: Once | INTRAMUSCULAR | Status: DC | PRN
Start: 2022-03-15 — End: 2022-03-15
  Administered 2022-03-15 (×2): 50 ug via INTRAVENOUS

## 2022-03-15 MED ORDER — LACTATED RINGERS INTRAVENOUS SOLUTION
INTRAVENOUS | Status: DC
Start: 2022-03-15 — End: 2022-03-15

## 2022-03-15 MED ORDER — NALOXONE 1 MG/ML INJECTION SYRINGE
1.0000 mg | INJECTION | INTRAMUSCULAR | Status: DC | PRN
Start: 2022-03-15 — End: 2022-03-15

## 2022-03-15 MED ORDER — LACTATED RINGERS INTRAVENOUS SOLUTION
INTRAVENOUS | Status: DC | PRN
Start: 2022-03-15 — End: 2022-03-15

## 2022-03-15 MED ORDER — SODIUM CHLORIDE 0.9 % (FLUSH) INJECTION SYRINGE
3.0000 mL | INJECTION | Freq: Three times a day (TID) | INTRAMUSCULAR | Status: DC
Start: 2022-03-15 — End: 2022-03-15

## 2022-03-15 MED ORDER — SODIUM CHLORIDE 0.9 % (FLUSH) INJECTION SYRINGE
3.0000 mL | INJECTION | INTRAMUSCULAR | Status: DC | PRN
Start: 2022-03-15 — End: 2022-03-15

## 2022-03-15 MED ORDER — METOCLOPRAMIDE 5 MG/ML INJECTION SOLUTION
10.0000 mg | Freq: Once | INTRAMUSCULAR | Status: DC | PRN
Start: 2022-03-15 — End: 2022-03-15

## 2022-03-15 MED ORDER — ONDANSETRON HCL (PF) 4 MG/2 ML INJECTION SOLUTION
4.0000 mg | Freq: Once | INTRAMUSCULAR | Status: DC | PRN
Start: 2022-03-15 — End: 2022-03-15

## 2022-03-15 MED ORDER — IPRATROPIUM 0.5 MG-ALBUTEROL 3 MG (2.5 MG BASE)/3 ML NEBULIZATION SOLN
3.0000 mL | INHALATION_SOLUTION | Freq: Once | RESPIRATORY_TRACT | Status: DC | PRN
Start: 2022-03-15 — End: 2022-03-15

## 2022-03-15 MED ORDER — DEXMEDETOMIDINE 200 MCG/50 ML (4 MCG/ML) IN 0.9 % SODIUM CHLORIDE IV
Freq: Once | INTRAVENOUS | Status: DC | PRN
Start: 2022-03-15 — End: 2022-03-15
  Administered 2022-03-15 (×3): 8 ug via INTRAVENOUS
  Administered 2022-03-15: 4 ug via INTRAVENOUS
  Administered 2022-03-15 (×2): 8 ug via INTRAVENOUS
  Administered 2022-03-15: 4 ug via INTRAVENOUS
  Administered 2022-03-15: 8 ug via INTRAVENOUS

## 2022-03-15 MED ORDER — DEXMEDETOMIDINE 200 MCG/50 ML (4 MCG/ML) IN 0.9 % SODIUM CHLORIDE IV
INTRAVENOUS | Status: AC
Start: 2022-03-15 — End: 2022-03-15
  Filled 2022-03-15: qty 50

## 2022-03-15 SURGICAL SUPPLY — 80 items
AMPULE LAB ELEVIEW 10ML ENDOS RESCT PROC STRL LF DISP (MED SURG SUPPLIES) IMPLANT
APPL ENDOS 40CM 360D SPRAY SET SNPLK GAS AST TIP DUPLOSPRAY MIS LF (ENDOSCOPIC SUPPLIES) IMPLANT
APPL ENDOS 40CM 360D SPRAY SET_SNPLK GAS AST TIP DUPLOSPRAY (INSTRUMENTS ENDOMECHANICAL)
CAP ENDOSCP SEAL 9.8-11.1MM MED RF ABLATION FLXB BARRX GIF-180 STRL DISP (ENDOSCOPIC SUPPLIES) IMPLANT
CAP ENDOSCP SEAL 9.8-11.1MM ME_D RF ABLATION BAL CATH FLXB (INSTRUMENTS ENDOMECHANICAL)
CAPSULE PH BRAVO CALIBRATE FREE RFLX DEL SYS (MED SURG SUPPLIES) IMPLANT
CAPSULE PH BRAVO CALIBRATE FRE_E RFLX DEL SYS (MED SURG SUPPLIES)
CATH ABLATION BARRX 4MM 160CM 20X13MM 90D BIPOLAR ELECTRODE BAL FOCL RF ESPH ACPT 8.6-12.8MM SCP (ENDOSCOPIC SUPPLIES) IMPLANT
CATH ABLATION RF (CATHETERS) IMPLANT
CATH ABLT BARRX 360 18-31MM 4C_M 8CM SLF ADJ BAL RAD FRQ (CATHETERS)
CATH CRE 6-7-8MM 7.5FR 5.5CM 240CM 2.8MM 3.2MM LOW PROF GW BAL DIL ESOPH PYL BIL PEBAX STRL LF  DISP (ENDOSCOPIC SUPPLIES) IMPLANT
CATH ELHMST GLD PROBE 7FR 300CM BIPOLAR RND DIST TIP STD CONN FIRM SHAFT HMGLD STRL DISP 2.8MM MN (ENDOSCOPIC SUPPLIES) IMPLANT
CATH ELHMST GLD PROBE 7FR 300C_M BIPOLAR RND DIST TIP STD (INSTRUMENTS ENDOMECHANICAL)
CATH ELHMST HALO 20MM (INSTRUMENTS ENDOMECHANICAL)
CATH ELHMST HALO 260CM GW (INSTRUMENTS ENDOMECHANICAL)
CATH ENDOS BARRX 2.8MM 135CM 1_5.7X7.5MM CHNL RF ABLT ACT (MED SURG SUPPLIES)
CATH ENDOS BARRX 2.8MM 135CM FLXB CHNL RF ABLATION ESPH DISP (MED SURG SUPPLIES) IMPLANT
CATH PH MONITORING VERSAFLEX Z 6FR ZNIS+8R 8 RING DISP (MED SURG SUPPLIES) IMPLANT
CATH PH MONITORING VERSAFLEX Z_6FR ZNIS+8R 8 RING DISP (MED SURG SUPPLIES)
CLIP HMST 2.8MM REPST DURACLIP 235CM 16MM CSCP (ENDOSCOPIC SUPPLIES) IMPLANT
CLIP HMST DURACLIP L16 MM_OD2.8 MM REPST CSCP (INSTRUMENTS ENDOMECHANICAL)
CLIP HMST MR CONDITIONAL BRD CATH ROT CONTROL KNOB NO SHEATH RSL 360 235CM 2.8MM 11MM OPN (ENDOSCOPIC SUPPLIES) IMPLANT
CLIP HMST RADOPQ PRELD STRL DISP RSL 235CM 2.8MM 11MM OPN (ENDOSCOPIC SUPPLIES) IMPLANT
CLIP LGT RSL 360 ULTRA 235CM B_RD ROT CONTROL KNOB 17MM OPN (ENDOSCOPIC SUPPLIES) IMPLANT
CLIP LGT RSL 360 ULTRA 235CM B_RD ROT CONTROL KNOB 17MM OPN (INSTRUMENTS ENDOMECHANICAL)
DEVICE HEMOSTASIS CLIP 360_235CM RESOLUTION (INSTRUMENTS ENDOMECHANICAL)
DEVICE RESOLUTION CLIP 235CML_M00522611 11MMW 10EA/BX (INSTRUMENTS ENDOMECHANICAL)
DILATOR ENDOS CRE 180CM 8CM 10-11-12MM 6FR ESOPH BAL LOW PROF FIX WRE PEBAX STRL LF  DISP 2.8MM (ENDOSCOPIC SUPPLIES) IMPLANT
DILATOR ENDOS CRE 180CM 8CM 10_-11-12MM 6FR ESOPH BAL LOW (INSTRUMENTS ENDOMECHANICAL)
DILATOR ENDOS CRE 240CM 5.5CM 11-13.5-15MM 7.5FR ESOPH PYL BIL BAL LOW PROF GW PEBAX STRL LF  DISP (ENDOSCOPIC SUPPLIES) IMPLANT
DILATOR ENDOS CRE 240CM 5.5CM 15-16.5-18MM 7.5FR ESOPH PYL BIL BAL LOW PROF GW PEBAX STRL LF  DISP (ENDOSCOPIC SUPPLIES) IMPLANT
DILATOR ENDOS CRE 240CM 5.5CM 18-19-20MM 7.5FR ESOPH PYL BIL BAL LOW PROF GW PEBAX STRL LF  DISP 2.8 (ENDOSCOPIC SUPPLIES) IMPLANT
DILATOR ENDOS CRE 240CM 5.5CM 8-9-10MM 7.5FR ESOPH PYL BIL BAL LOW PROF GW PEBAX STRL LF  DISP 2.8MM (ENDOSCOPIC SUPPLIES) IMPLANT
DILATOR ENDOS CRE 240CM 5.5CM_11-13.5-15MM 7.5FR ESOPH PYL (INSTRUMENTS ENDOMECHANICAL)
DILATOR ENDOS CRE 240CM 5.5CM_15-16.5-18MM 7.5FR ESOPH PYL (INSTRUMENTS ENDOMECHANICAL)
DILATOR ENDOS CRE 240CM 5.5CM_18-19-20MM 7.5FR ESOPH PYL BIL (INSTRUMENTS ENDOMECHANICAL)
DILATOR ENDOS CRE 240CM 5.5CM_8-9-10MM 7.5FR ESOPH PYL BIL (INSTRUMENTS ENDOMECHANICAL)
DILATOR ESOPH BAL 6/7/8MM_M00558660 7.5FR 240CML (INSTRUMENTS ENDOMECHANICAL)
DISC NO SUB - FORCEPS BIOPSY MLT SAMPLE 240CM 2.4MM MTBT 2.8MM STRL DISP ORNG (ENDOSCOPIC SUPPLIES) IMPLANT
DISC NO SUB - FORCEPS BIOPSY MLT SAMPLE 240C_M 2.4MM MTBT 2.8MM STRL DISP (INSTRUMENTS ENDOMECHANICAL)
ELECTRODE ESURG 85SQ CM 4MR NESSY OMG NONST DISP RTN PLATE NEUTRALIZE CABLE PLUG LF (MED SURG SUPPLIES) IMPLANT
ELECTRODE ESURG 85SQ CM 4MR NE_SSY OMG NONST DISP ADLT RTN (MED SURG SUPPLIES)
ELEVIEW 10CC AMPOULES (MED SURG SUPPLIES)
FORCEPS BIOPSY 160CM 1.8MM RJ 4 PED 2+ MM DISP GASTROSCOPIC (ENDOSCOPIC SUPPLIES) IMPLANT
FORCEPS BIOPSY 160CM 1.8MM RJ_4 PED 2+ MM DISP GASTROSCOPIC (INSTRUMENTS ENDOMECHANICAL)
FORCEPS BIOPSY MLT SAMPLE 240C_M 2.4MM MTBT 2.8MM STRL DISP (INSTRUMENTS ENDOMECHANICAL)
FORCEPS ENDOS HOT PRCS BITE 24_0CM 2.8MM 2.2MM RJ 4 CUP DISP (ENDOSCOPIC SUPPLIES) IMPLANT
FORCEPS ENDOS HOT PRCS BITE 24_0CM 2.8MM 2.2MM RJ 4 CUP DISP (INSTRUMENTS ENDOMECHANICAL)
GW ENDOS .038IN 260CM BARRX RFA STR DIST TIP FLXB SS STRL DISP (ENDOSCOPIC SUPPLIES) IMPLANT
GW ENDOS 200CM SAVARY FLX TP_SS NONST (INSTRUMENTS ENDOMECHANICAL)
GW URO 200CM SAVARY FLX TP SS NONST (ENDOSCOPIC SUPPLIES) IMPLANT
KIT JEJUN 45CM 22FR SIL MIC SECURLOK LL-SLIP RADOPQ UNIV FEED PORT CONN INTERNAL RETENTION BAL TAPER (MED SURG SUPPLIES) IMPLANT
LIGATOR 2.8MM 8.6-11.5MM SSS7 ESOPH 1 STNG MLT BAND HNDL STRL DISP ENDOS HMSTS LF (ENDOSCOPIC SUPPLIES) IMPLANT
LIGATOR 2.8MM 8.6-11.5MM SSS7_ESOPH 1 STNG MLT BAND ERG HNDL (INSTRUMENTS ENDOMECHANICAL)
MARKER ENDOS SPOT EX PERM IND DRK SYRG 5ML (MED SURG SUPPLIES) IMPLANT
MARKER ENDOS SPOT EX PREFL PRE_ASSEMBLE SYRG PERM (MED SURG SUPPLIES)
NEEDLE SCLRTX 25GA 1.8MM .24MM_INNER CATH SHEATH STRL DISP (INSTRUMENTS ENDOMECHANICAL)
NEEDLE SCLRTX 25GA 1.8MM CLR 4MM 200CM (ENDOSCOPIC SUPPLIES) IMPLANT
NEEDLE SCLRTX 25GA 2.3MM BVL STRL DISP STAR CATH INTJCT 4MM 240CM (ENDOSCOPIC SUPPLIES) IMPLANT
NEEDLE SCLRTX 25GA 2.3MM BVL S_TRL DISP STAR CATH INTJCT 4MM (INSTRUMENTS ENDOMECHANICAL)
NEEDLE SCLRTX 25GA 2.3MM CSCP OPTC TIP STRL LF  DISP FLXTP 5MM 230CM STD (ENDOSCOPIC SUPPLIES) IMPLANT
NEEDLE SCLRTX 25GA 2.3MM CSCP_OPTC TIP STRL LF DISP FLXTP 5 (INSTRUMENTS ENDOMECHANICAL)
NEEDLE SCLRTX 25GA 2.3MM INJ S_TRL LF DISP FLXTP 5MM 160MM (INSTRUMENTS ENDOMECHANICAL)
NEEDLE SCLRTX 25GA 2.3MM STRL DISP FLXTP 5MM 160CM STD (ENDOSCOPIC SUPPLIES) IMPLANT
NET SPEC RETR 230CM 2.5MM RTHNT SHEATH 6X3CM PLYP NONST LF  DISP (ENDOSCOPIC SUPPLIES) IMPLANT
NET SPEC RETR 230CM 2.5MM RTHN_T FLXB DRBLE PCKT 6X3CM PLYP (INSTRUMENTS ENDOMECHANICAL)
PROBE COAG 7.2FT 6.9FR FIAPC CRCMF PLUG PLAY FUNCTIONALITY FILTER (SURGICAL CUTTING SUPPLIES) IMPLANT
PROBE ESURG 300CM 2.3MM FIAPC FLXB CRCMF STRL DISP (SURGICAL CUTTING SUPPLIES) IMPLANT
PROBE ESURG 300CM 2.3MM FIAPC_FLXB STR FIRE ARGON PLAS COAG (CUTTING ELEMENTS)
PROBE ESURG 7.2FT 6.9FR FIAPC_CRCMF PLUG PLAY FUNCTIONALITY (CUTTING ELEMENTS)
SNARE RND 240CM 2.4MM CAPTIVATR COLD STF THN WRE ENDOS PLPCTM 10MM DISP (ENDOSCOPIC SUPPLIES) IMPLANT
SNARE SURG 10MM RND COLD (INSTRUMENTS ENDOMECHANICAL)
SYRINGE INFLAT ALN II GA STRL DISP 60ML (ENDOSCOPIC SUPPLIES) IMPLANT
SYRINGE INFLAT ALN II GA STRL_DISP 60ML (INSTRUMENTS ENDOMECHANICAL)
TIP APPL 40CM SS DUPLOSPRAY (MED SURG SUPPLIES) IMPLANT
TUBE JEJUN MIC SECURLOK 22FR 4_5CM RADOPQ STRP RTNT BAL (MED SURG SUPPLIES)
USE ITEM 305700 CLIP LGT RSL 360 ULTRA 235CM B_RD ROT CONTROL KNOB 17MM OPN (ENDOSCOPIC SUPPLIES) IMPLANT
USE ITEM 343174 SNARE RND 240CM 2.4MM CAPTIVATR COLD STF THN WRE ENDOS PLPCTM 10MM DISP (ENDOSCOPIC SUPPLIES) IMPLANT
WATER STRL 1000ML PLASTIC PR BTL LF (MED SURG SUPPLIES) IMPLANT
WATER STRL 1000ML PLASTIC PR B_TL LF (MED SURG SUPPLIES)

## 2022-03-15 NOTE — OR Surgeon (Signed)
Sean Wall  07/11/1956  03/15/2022    Name of procedure: Colonoscopy with polypectomy (snare and forceps)    Indication: CRC screening    Anesthesia: Monitored anesthesia care administered by anesthesia attending Dr. Virgia Wall. CO2 insufflation was used for the entire duration of the procedure.    Endoscopist: Sean Sailors, MD    Description of procedure:      Prior to obtaining the procedure, a history and physical as performed and patients medications and allergies were reviewed. The risks of anesthesia complications, bleeding, perforation, mucosal tears and need for the procedure were discussed with patient prior to obtaining informed consent. After adequate sedation, perianal and digital rectal exam was done. The scope was then passed under direct visualization. Through out the entire procedure, the patients blood pressure, pulse and oxygen saturation was monitored continuously. Perianal exam was normal.  Colonoscope was introduced through the anus and advanced up to the cecum. The cecum was identified by presence of appendiceal orifice and ileocecal valve. Retroflexion was performed in the rectum. The quality of the bowel prep was assessed using the [facility] Bowel Prep Score (BBPS). The total BBPS is 8. Withdrawal time >6 minutes. The colonoscopy was accomplished without difficulty. Patient tolerated the procedure well, there were no complications and the patient was taken to the recovery room in stable condition.       Findings:    Non bleeding grade 1 internal hemorrhoids were seen on retroflexion.  A 5 mm sessile polyp was identified in the transverse colon. Polypectomy was performed using a cold snare.  A 10 mm sessile polyp was identified in the hepatic flexure. Polypectomy was performed using a cold snare.  A 5 mm sessile polyp was identified in the hepatic flexure. Polypectomy was performed using a cold snare.  A 3 mm sessile polyp was identified in the ascending colon. Polypectomy was performed using  biopsy forceps.  Normal colonic mucosa seen in the rectum, descending colon, transverse colon, hepatic flexure, ascending colon and cecum.  Normal mucosa of the terminal ileum       Impression:    Non bleeding small internal hemorrhoids  Transverse colon polyp. Polypectomy performed.  Two hepatic flexure polyps. Polypectomy performed.   Ascending colon polyp. Polypectomy performed.  Normal colonic mucosa  Normal terminal ileum mucosa  [place] Bowel Prep Score (BBPS) 8      Recommendations:    Your pathology results will be available through [facility] for review.  Resume present medication.  High fiber diet.  Repeat Colonoscopy in 3 years for surveillance purposes.  Follow up with PCP as previously scheduled  Contact GI clinic at [phone] to schedule a follow up appointment at [place]. [address], [place], [place] if needed for further review of results that are available through [facility] or for additional questions or concerns.  Patient has a contact number available for emergencies. The signs and symptoms of potential delayed complications were discussed with the patient  Return to normal activities tomorrow.  Go to the nearest emergency room in the event of any significant delayed complications.  Discharge home with escort.    I am scribing for, and in the presence of, Dr. Brooks Wall for services provided on 03/15/2022.  Sean Wall, SCRIBE   [place], [place]    I personally performed the services described in this documentation, as scribed  in my presence, and it is both accurate  and complete.    Sean Sailors, MD    CC: Sean Haff, MD

## 2022-03-15 NOTE — Anesthesia Transfer of Care (Signed)
ANESTHESIA TRANSFER OF CARE   Sean Wall is a 66 y.o. 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    HTN (hypertension)     Hyperlipidemia     Ischemic colitis (CMS [facility])     Sleep apnea     Tinnitus       Allergy History as of 03/15/22       PROPOFOL         Noted Status Severity Type Reaction    04/08/21 0848 Gaetano Hawthorne, RN 04/08/21 Active Medium  Rash              LITHIUM         Noted Status Severity Type Reaction    11/12/21 0754 Maurene Capes, MA 11/12/21 Active       Comments: Blurred vision                   I completed my transfer of care / handoff to the receiving personnel during which we discussed:  Access, Airway, All key/critical aspects of case discussed, Analgesia, Antibiotics, Expectation of post procedure, Fluids/Product, Gave opportunity for questions and acknowledgement of understanding, Labs and PMHx      Post Location: Phase II                                                           Last OR Temp: Temperature: 36.4 C (97.5 F)  ABG:  POTASSIUM   Date Value Ref Range Status   11/12/2021 4.1 3.5 - 5.1 mmol/L Final     KETONES   Date Value Ref Range Status   04/08/2021 2+ (A) Not Detected mg/dL Final     CALCIUM   Date Value Ref Range Status   11/12/2021 8.9 8.8 - 10.2 mg/dL Final     Calculated P Axis   Date Value Ref Range Status   04/08/2021 80 degrees Final     Calculated R Axis   Date Value Ref Range Status   04/08/2021 55 degrees Final     Calculated T Axis   Date Value Ref Range Status   04/08/2021 28 degrees Final     Airway:* No LDAs found *  Blood pressure 126/77, pulse  57, temperature 36.4 C (97.5 F), resp. rate 16, SpO2 100 %.

## 2022-03-15 NOTE — Anesthesia Preprocedure Evaluation (Addendum)
ANESTHESIA PRE-OP EVALUATION  Planned Procedure: COLONOSCOPY  Review of Systems     anesthesia history negative     patient summary reviewed          Pulmonary   current smoker (Occ. cigar),  no COPD, no asthma and no shortness of breath   Cardiovascular    Hypertension ,No past MI and no angina,  Exercise Tolerance: > or = 4 METS        GI/Hepatic/Renal    GERD and well controlled        Endo/Other   neg endo/other ROS,       Neuro/Psych/MS    headaches, depression     Cancer                      Physical Assessment      Airway       Mallampati: I    TM distance: >3 FB    Neck ROM: full  Mouth Opening: good.            Dental       Dentition intact             Pulmonary    Breath sounds clear to auscultation       Cardiovascular    Rhythm: regular  Rate: Normal       Other findings            Plan  ASA 2     Planned anesthesia type: MAC                     PONV/POV Plan:  I plan to administer pharmcologic prophalaxis antiemetics  Intravenous induction     Anesthesia issues/risks discussed are: PONV, Dental Injuries, Post-op Pain Management, Cardiac Events/MI, Aspiration, Sore Throat, Intraoperative Awareness/ Recall, Stroke and Blood Loss.  Anesthetic plan and risks discussed with patient  signed consent obtained            Patient's NPO status is appropriate for Anesthesia.           Plan discussed with CRNA.

## 2022-03-15 NOTE — Discharge Instructions (Signed)
COLONOSCOPY/ SIGMOIDOSCOPY  RESTRICTION ON ACTIVITY  IF SEDATED:  Do not drive a car or operate machinery until the day after the procedure.  Following day: Return to full activity including work.    Diet:  Eat and drink normally unless instructed otherwise.    Treatment for common after effects:    For Colonoscopy or Sigmoidoscopy:  Mild abdominal pain, bloating or excess gas: Rest, eat lightly and use a heating pad.    Symptoms to watch for and report to your physician:  Chills or fever occurring within 24 hours after procedure  Severe abdominal pain or bloating  A large amount of rectal bleeding. (A small amount of blood from the rectum is not serious, especially if hemorrhoids are present).  Pain in the chest.    If a polyp has been removed:  For the next 7 days:  Do not take aspirin                                        Check with physician if taking a long car trip.  If bright red rectal bleeding occurs, call your physician.  For 3 days: No heavy lifting, straining, or running.    If scheduled for a Barium Enema, you will be given an instruction sheet in radiology.    If you have any problems, questions or concerns contact your physician.  If you are unable to reach your physician and feel you need immediate attention, go to the Emergency Room.    General Anesthesia, Adult, Care After    Refer to this sheet in the next few weeks.  These instructions provide you with information on caring for yourself after your procedure.  Your health care provider may also give your more specific instructions. Your treatment has been planned according to current medical practices, but problems sometimes occur.  Call your health care provider if you have any problems or questions after your procedure.    WHAT TO EXPECT AFTER THE PROCEDURE  After the procedure, it is typical to experience:    Sleepiness  Nausea and vomiting    HOME CARE INSTRUCTIONS    For the first 24 hours after general anesthesia:  Have a responsible person  with you.  Do not drive a car. If you are alone, do not take public transportation  Do not drink alcohol.  Do not take medicine that has not been prescribed by your health care provider.  Do not sign important papers or make important decisions.  You may resume a normal diet and activities as directed by your health care provider.  Change bandages (dressings) as directed.  If you have questions or problems that seem related to general anesthesia, call the hospital and ask if the anesthetist or anesthesiologist on call.    SEEK MEDICAL CARE IF:    You have nausea and vomiting that continue the day after anesthesia.  You develop a rash:    SEE IMMEDIATE MEDICAL CARE IF:    You have difficulty breathing.  You have chest pain.  You have any allergic problems.    This information is not intended to replace advice given to you by your health care provider. Make sure you discuss any questions you have with your health care provider.

## 2022-03-15 NOTE — H&P (Signed)
Gastroenterology H&P     Sean Wall, 66 y.o., male    Date of service: 03/15/22  Date of Birth:  1956/03/24    Referring Physician: [name], MD    Chief Complaint:  CRC Screening    History of Present Illness:      Sean Wall is a 66 y.o.male patient with significant PMH listed below here for [facility] Screening.     Denies fevers, chills, nausea, vomiting, heartburn, reflux, melena, hematochezia, or unintentional weight loss.     Review of Systems:  ROS otherwise negative except for pertinent positives discussed in HPI above.    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    HTN (hypertension)     Hyperlipidemia     Ischemic colitis (CMS HCC)     Sleep apnea     Tinnitus          Past Surgical History:   Procedure Laterality Date    COLONOSCOPY  04/09/2021    ELBOW SURGERY Left     HX APPENDECTOMY      HX CARPAL TUNNEL RELEASE Bilateral     HX LUMBAR FUSION      hardware in place    HX SHOULDER SURGERY Bilateral     5 surgeries on right and 3 on the left    HX TURP      HX VASECTOMY      INGUINAL HERNIA REPAIR Right     LASIK Bilateral          Family Medical History:       Problem Relation (Age of Onset)    No Known Problems Mother, Father, Sister, Brother, Half-Sister, Half-Brother, Maternal Aunt, Maternal Uncle, Paternal 71, Paternal Uncle, Maternal Grandmother, Maternal Grandfather, Paternal Grandmother, Paternal Grandfather, Daughter, Son, Other            Social History     Socioeconomic History    Marital status: Married   Tobacco Use    Smoking status: Light Smoker     Types: Cigars    Smokeless tobacco: Never    Tobacco comments:     OCCASIONAL SMOKING-1 CIGAR EVERY 2 WEEKS   Vaping Use    Vaping Use: Never used   Substance and Sexual Activity    Alcohol use: Yes      Alcohol/week: 21.0 standard drinks     Types: 21 Cans of beer per week     Comment: 2 drinks daily    Drug use: Never   Other Topics Concern    Ability to Walk 1 Flight of Steps without SOB/CP Yes    Ability To Do Own ADL's Yes     Expanded Substance History     Additional history     Allergies   Allergen Reactions    Propofol Rash    Lithium      Blurred vision     No outpatient medications have been marked as taking for the 03/15/22 encounter [facility] Encounter).     No current facility-administered medications for this encounter.      Objective:    Physical Exam:  There were no vitals taken for this visit.  General Appearance: The patient is lying in bed in no acute distress.    ENT: PERL EOMI, nose and mouth mucosa unremarkable   Heart: S1-S2 normal, no murmurs  Lungs: clear to auscultation bilaterally, no crackles  Abdomen: soft, non-tender to palpation, no rebound tenderness or guarding, bowel sounds present  Neurological: alert oriented x3, no focal deficit  Extremities: no edema noted  Skin: no rashes  Joints: no deformities     Labs:  No visits with results within 1 Day(s) from this visit.   Latest known visit with results is:   Appointment on 12/21/2021   Component Date Value Ref Range Status    PSA 12/21/2021 1.40  <=4.00 ng/mL Final    Total PSA measurement performed with Abbott immunoassay.Patient results determined by assays using different manufacturers or methods may not be comparable.    No clinical decision points have been set for males <14 years of age. For adult males, total PSA results >4.0 ng/mL and <10.1 ng/mL should be tested for free PSA, and the free PSA ratio calculated for risk assessment. Free PSA testing is NOT indicated when total PSA is <4.0 ng/mL, or >10.0 ng/mL. Benign prostatic hyperplasia can yield total PSA results in the grey zone, but typicallly not results >10.0 ng/mL. Artifactual, mild PSA elevations can occur if sexual activity or digital rectal examination are  performed less than 2 days before specimen collection.  PSA Methodology- Chemiluminescent Immunoassay performed on Abbott Alinity.       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 FINDINGS: There is normal alignment of cervical spine. No fracture or dislocation is seen, with vertebral body heights preserved. Bone marrow signal is within normal limits. Focal fat density of the C2 vertebral body is likely a small hemangioma. At C2-C3 there is no spinal canal or neural foraminal stenosis. At C3-C4 facet arthropathy results in mild bilateral neural foraminal narrowing. No spinal canal narrowing is seen. At C4-C5 uncovertebral and facet arthropathy results in moderate left and mild right neural foraminal narrowing. A central left paracentral disc herniation results in mild spinal canal narrowing. At C5-C6 there is central and paracentral zone disc herniation resulting in mild to moderate spinal canal narrowing. There is moderate bilateral neural foraminal narrowing due to uncovertebral and facet osteoarthropathy. At C6-C7 there is moderate bilateral neural foraminal narrowing. No spinal canal narrowing is seen. At C7-T1 there is no spinal canal narrowing. Mild bilateral neural foraminal narrowing is seen. No abnormal signal is seen in the spinal cord. The soft tissues of the neck are unremarkable.     Mild to moderate degenerative changes of the cervical spine, with mild to moderate spinal canal narrowing at C5-C6 as well as moderate neural foraminal narrowing on the left at C4-C5 and bilaterally at C5-C6 and C6-C7. 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 COMPARISON: MRI of the cervical spine on 03/02/2022 FINDINGS: There is straightening  of normal cervical lordosis. Vertebral body heights are preserved with no evidence of acute fracture or dislocation. Mild loss of vertebral disc height is seen at C4-C5 and C5-C6. No intersegmental hypermobility is seen with flexion or extension. Mild uncovertebral osteophytes arthropathy is seen, primarily C4-C5 and C5-C6.     Mild degenerative changes of the cervical spine. Radiologist location ID: [id number]       Assessment:   Sean Wall is a 66 y.o.male patient with significant PMH listed above here for CRC Screening.     Recommendations:    The patient was explained the potential risks of the endoscopic procedure(s) including but not limited to bleeding, perforation, mucosal tears, spleen laceration, anesthesia related complications including death and has agreed to undergo these tests.    Brooks Sailors, MD  Gastroenterologist

## 2022-03-15 NOTE — Anesthesia Postprocedure Evaluation (Signed)
Anesthesia Post Op Evaluation    Patient: Sean Wall  Procedure(s) with comments:  COLONOSCOPY WITH POLYPECTOMY - Possible biopsy, possible polypectomy    Last Vitals:Temperature: 36.4 C (97.5 F) (03/15/22 1152)  Heart Rate: 58 (03/15/22 1345)  BP (Non-Invasive): 110/76 (03/15/22 1345)  Respiratory Rate: (!) 10 (03/15/22 1340)  SpO2: 96 % (03/15/22 1355)    No notable events documented.    Patient is sufficiently recovered from the effects of anesthesia to participate in the evaluation and has returned to their pre-procedure level.  Patient location during evaluation: PACU       Patient participation: complete - patient participated  Level of consciousness: awake and alert and responsive to verbal stimuli    Pain management: adequate  Airway patency: patent    Anesthetic complications: no  Cardiovascular status: acceptable  Respiratory status: acceptable  Hydration status: acceptable  Patient post-procedure temperature: Pt Normothermic   PONV Status: Absent

## 2022-03-16 DIAGNOSIS — K559 Vascular disorder of intestine, unspecified: Secondary | ICD-10-CM

## 2022-03-16 DIAGNOSIS — Z86010 Personal history of colonic polyps: Secondary | ICD-10-CM

## 2022-03-16 LAB — SURGICAL PATHOLOGY SPECIMEN

## 2022-03-16 NOTE — Telephone Encounter (Signed)
SPOKE TO PATIENT TODAY today and we scheduled [facility] for 04/06/22 @ 1015 AM. Patient is to be there @ 915AM.  I confirmed patients insurance and verbally went over all instructions with the patient as well as printed them and gave them/mailed them to the patient. We also went over the medications that need held which are MOTRIN for 4 days.  All questions were answered. The patient voiced understanding.   Sean Wall

## 2022-03-21 NOTE — Addendum Note (Signed)
Addendum  created 03/21/22 1132 by Gloris Ham, CRNA    Intraprocedure Meds edited

## 2022-03-28 ENCOUNTER — Emergency Department
Admission: EM | Admit: 2022-03-28 | Discharge: 2022-03-28 | Disposition: A | Discharge status: Home / Self Care | Payer: Medicare (Managed Care) | Attending: EMERGENCY MEDICINE | Admitting: EMERGENCY MEDICINE

## 2022-03-28 ENCOUNTER — Emergency Department (HOSPITAL_COMMUNITY): Payer: TRICARE For Life--Medicare Supplement

## 2022-03-28 ENCOUNTER — Other Ambulatory Visit: Payer: Self-pay

## 2022-03-28 ENCOUNTER — Encounter (HOSPITAL_COMMUNITY): Payer: Self-pay

## 2022-03-28 ENCOUNTER — Emergency Department (HOSPITAL_COMMUNITY): Payer: Medicare (Managed Care)

## 2022-03-28 DIAGNOSIS — Z981 Arthrodesis status: Secondary | ICD-10-CM

## 2022-03-28 DIAGNOSIS — I10 Essential (primary) hypertension: Secondary | ICD-10-CM

## 2022-03-28 DIAGNOSIS — Z8719 Personal history of other diseases of the digestive system: Secondary | ICD-10-CM

## 2022-03-28 DIAGNOSIS — M5440 Lumbago with sciatica, unspecified side: Secondary | ICD-10-CM

## 2022-03-28 DIAGNOSIS — M5136 Other intervertebral disc degeneration, lumbar region: Secondary | ICD-10-CM

## 2022-03-28 DIAGNOSIS — Z87438 Personal history of other diseases of male genital organs: Secondary | ICD-10-CM

## 2022-03-28 DIAGNOSIS — M48061 Spinal stenosis, lumbar region without neurogenic claudication: Secondary | ICD-10-CM

## 2022-03-28 DIAGNOSIS — M8580 Other specified disorders of bone density and structure, unspecified site: Secondary | ICD-10-CM | POA: Insufficient documentation

## 2022-03-28 DIAGNOSIS — Z720 Tobacco use: Secondary | ICD-10-CM | POA: Insufficient documentation

## 2022-03-28 DIAGNOSIS — M5430 Sciatica, unspecified side: Secondary | ICD-10-CM

## 2022-03-28 LAB — URINALYSIS, MACROSCOPIC
BILIRUBIN: NOT DETECTED mg/dL
BLOOD: NOT DETECTED mg/dL
GLUCOSE: NOT DETECTED mg/dL
KETONES: NOT DETECTED mg/dL
LEUKOCYTES: NOT DETECTED WBCs/uL
NITRITE: NOT DETECTED
PH: 5 — ABNORMAL LOW (ref 5.0–?)
PROTEIN: NOT DETECTED mg/dL
SPECIFIC GRAVITY: 1.023 (ref 1.005–?)
UROBILINOGEN: NOT DETECTED mg/dL

## 2022-03-28 LAB — COMPREHENSIVE METABOLIC PANEL, NON-FASTING
ALBUMIN: 4.1 g/dL (ref 3.4–4.8)
ALKALINE PHOSPHATASE: 67 U/L (ref 45–115)
ALT (SGPT): 27 U/L (ref 10–55)
ANION GAP: 7 mmol/L (ref 4–13)
AST (SGOT): 18 U/L (ref 8–45)
BILIRUBIN TOTAL: 0.5 mg/dL (ref 0.3–1.3)
BUN/CREA RATIO: 23 — ABNORMAL HIGH (ref 6–22)
BUN: 23 mg/dL (ref 8–25)
CALCIUM: 9 mg/dL (ref 8.8–10.2)
CHLORIDE: 104 mmol/L (ref 96–111)
CO2 TOTAL: 28 mmol/L (ref 23–31)
CREATININE: 0.98 mg/dL (ref 0.75–1.35)
ESTIMATED GFR: 84 mL/min/BSA (ref >=60)
GLUCOSE: 103 mg/dL (ref 65–125)
POTASSIUM: 4.4 mmol/L (ref 3.5–5.1)
PROTEIN TOTAL: 6.9 g/dL (ref 6.0–8.0)
SODIUM: 139 mmol/L (ref 136–145)

## 2022-03-28 LAB — CBC WITH DIFF
BASOPHIL #: <0.1 10*3/uL (ref <=0.20)
BASOPHIL %: 0 %
EOSINOPHIL #: 0.24 10*3/uL (ref <=0.50)
EOSINOPHIL %: 4 %
HCT: 52.8 % — ABNORMAL HIGH (ref 38.9–52.0)
HGB: 17.6 g/dL — ABNORMAL HIGH (ref 13.4–17.5)
IMMATURE GRANULOCYTE #: <0.1 10*3/uL (ref <0.10)
IMMATURE GRANULOCYTE %: 0 % (ref 0–1)
LYMPHOCYTE #: 1.5 10*3/uL (ref 1.00–4.80)
LYMPHOCYTE %: 25 %
MCH: 30.6 pg (ref 26.0–32.0)
MCHC: 33.3 g/dL (ref 31.0–35.5)
MCV: 91.7 fL (ref 78.0–100.0)
MONOCYTE #: 0.57 10*3/uL (ref 0.20–1.10)
MONOCYTE %: 9 %
MPV: 9.8 fL (ref 8.7–12.5)
NEUTROPHIL #: 3.72 10*3/uL (ref 1.50–7.70)
NEUTROPHIL %: 62 %
PLATELETS: 222 10*3/uL (ref 150–400)
RBC: 5.76 10*6/uL (ref 4.50–6.10)
RDW-CV: 12.6 % (ref 11.5–15.5)
WBC: 6.1 10*3/uL (ref 3.7–11.0)

## 2022-03-28 MED ORDER — HYDROCODONE 5 MG-ACETAMINOPHEN 325 MG TABLET
1.0000 | ORAL_TABLET | Freq: Four times a day (QID) | ORAL | 0 refills | Status: DC | PRN
Start: 2022-03-28 — End: 2023-11-29

## 2022-03-28 MED ORDER — FENTANYL (PF) 50 MCG/ML INJECTION SOLUTION
50.0000 ug | INTRAMUSCULAR | Status: AC
Start: 2022-03-28 — End: 2022-03-28
  Administered 2022-03-28: 50 ug via INTRAVENOUS
  Filled 2022-03-28: qty 2

## 2022-03-28 MED ORDER — CYCLOBENZAPRINE 10 MG TABLET
10.0000 mg | ORAL_TABLET | Freq: Three times a day (TID) | ORAL | 0 refills | Status: AC
Start: 2022-03-28 — End: 2022-04-04

## 2022-03-28 MED ORDER — LORAZEPAM 2 MG/ML INJECTION WRAPPER
1.0000 mg | INTRAMUSCULAR | Status: AC
Start: 2022-03-28 — End: 2022-03-28
  Administered 2022-03-28: 1 mg via INTRAVENOUS
  Filled 2022-03-28: qty 1

## 2022-03-28 MED ORDER — DEXAMETHASONE SODIUM PHOSPHATE 4 MG/ML INJECTION SOLUTION
10.0000 mg | INTRAMUSCULAR | Status: AC
Start: 2022-03-28 — End: 2022-03-28
  Administered 2022-03-28: 10 mg via INTRAVENOUS
  Filled 2022-03-28: qty 5

## 2022-03-28 MED ORDER — PREDNISONE 10 MG TABLET
ORAL_TABLET | ORAL | 0 refills | Status: DC
Start: 2022-03-28 — End: 2022-12-29

## 2022-03-28 NOTE — ED Provider Notes (Signed)
Emergency Department  Provider Note  HPI - 03/28/2022    COVID-19 PANDEMIC IN EFFECT    Recent [id number] history, if applicable:  Lab Results   Component Value Date    COVID19PCR Not Detected 04/08/2021     History and Physical Exam     Sean Wall 66 y.o. male  Date of Birth: 04-Nov-1955     Attending: Dr. Maida Sale    PCP: [name], MD      HPI:    Visit Reason:   Chief Complaint   Patient presents with    Back Pain       Entered the patient's room for initial exam at 0815 hours.    Arrival: The patient arrived by private car and is accompanied by wife.  History Limitations: none    Sean Wall is a 65 y.o. male  with a PMHx of HTN, ED, ischemic colitis, appendectomy, lumbar fusion, BL shoulder surgery, and vasectomy. Patient presents to the ED today for back pain. Patient reports experiencing progressive L lower back pain for the last five days. Pain is stated to radiate down into his LLE and has associated numbness in his LLE. Pt states having slight urinary hesitancy since his symptoms originated. He endorses undergoing back surgery five years ago. Pt denies any injuries or falls that might have caused his pain. Hydrocodone administration has not provided any relief. No further complaints or associated symptoms are stated. Pain is stated to be progressive and rated at 7/10.    Patient has not had any recent fevers or known sick exposures. Patient denies having any head pain, neck pain,  chest pain, cough, SOB, abdominal pain, or n/v/d. Patient does not have any other associated s/s at this time.    There are no other complaints or concerns noted at this time.    Review of Systems:    Constitutional: (-) fever, chills  Skin: (-) rashes, lesions  HENT: (-) sore throat, ear pain, difficulty swallowing  Eyes: (-) vision changes, redness, discharge  Cardio: (-) chest pain, (-) palpitations   Respiratory: (-) cough, wheezing, SOB  GI:  (-) nausea, vomiting, diarrhea, constipation, abdominal pain  GU:   (+) Urinary hesitancy, no hematuria, polyuria  MSK: (+) L lower back pain, no joint pain, neck pain  Neuro: (+) LLE numbness, no tingling, weakness, headache  Psych: (-) SI, HI, anxiety, depression.  All other systems reviewed and are negative, unless commented on in the HPI.     Past Medical History     PMHx:    Medical History     Diagnosis Date Comment Source    Acid reflux       CPAP (continuous positive airway pressure) dependence       Enlarged prostate       Erectile dysfunction       Headache       Hemorrhoids       History of anesthesia complications  Rash--looked like stephen Johnson   syndrome--propofol     HTN (hypertension)       Hyperlipidemia       Ischemic colitis (CMS HCC)       Sleep apnea       Tinnitus          Allergies:    Allergies   Allergen Reactions    Propofol Rash    Lithium      Blurred vision     Social History  Social History     Tobacco  Use    Smoking status: Light Smoker     Types: Cigars    Smokeless tobacco: Never    Tobacco comments:     OCCASIONAL SMOKING-1 CIGAR EVERY 2 WEEKS   Vaping Use    Vaping Use: Never used   Substance Use Topics    Alcohol use: Yes     Alcohol/week: 21.0 standard drinks     Types: 21 Cans of beer per week     Comment: 2 drinks daily    Drug use: Never     Family History  Family Medical History:       Problem Relation (Age of Onset)    No Known Problems Mother, Father, Sister, Brother, Half-Sister, Half-Brother, Maternal Aunt, Maternal Uncle, Paternal 66, Paternal Uncle, Maternal Grandmother, Maternal Grandfather, Paternal Grandmother, Paternal 29, Daughter, Son, Other           Home Meds:   No current facility-administered medications for this encounter.    Current Outpatient Medications:     atorvastatin (LIPITOR) 40 mg Oral Tablet, Take 1 Tablet (40 mg total) by mouth Every evening, Disp: , Rfl:     azelastine-fluticasone 137-50 mcg/spray Nasal Spray, Non-Aerosol, Administer 2 Sprays into affected nostril(s) Every night, Disp: , Rfl:      bisacodyL (DULCOLAX) 5 mg Oral Tablet, Delayed Release (E.C.), Take 2 Tablets (10 mg total) by mouth Every 24 hours as needed for Constipation for up to 1 dose To be taken in the evening, Disp: 2 Tablet, Rfl: 0    buPROPion (WELLBUTRIN XL) 300 mg extended release 24 hr tablet, Take 1 Tablet (300 mg total) by mouth Once a day, Disp: , Rfl:     cetirizine (ZYRTEC) 10 mg Oral Tablet, Take 1 Tablet (10 mg total) by mouth Once a day, Disp: , Rfl:     cyanocobalamin (VITAMIN B12) 1,000 mcg/mL Injection Solution, 1 mL (1,000 mcg total) Every 30 days, Disp: , Rfl:     cyclobenzaprine (FLEXERIL) 10 mg Oral Tablet, Take 1 Tablet (10 mg total) by mouth Three times a day as needed, Disp: , Rfl:     cyclobenzaprine (FLEXERIL) 10 mg Oral Tablet, Take 1 Tablet (10 mg total) by mouth Three times a day for 7 days, Disp: 21 Tablet, Rfl: 0    ferrous sulfate (FERATAB) 324 mg (65 mg iron) Oral Tablet, Delayed Release (E.C.), Take 1 Tablet (324 mg total) by mouth Every other day On Monday, Wednesday, friday (Patient not taking: Reported on 03/10/2022), Disp: , Rfl:     finasteride (PROSCAR) 5 mg Oral Tablet, Take 1 Tablet (5 mg total) by mouth Once a day, Disp: , Rfl:     gabapentin (NEURONTIN) 300 mg Oral Capsule, Take 1 Capsule (300 mg total) by mouth Three times a day, Disp: , Rfl:     HERBAL DRUGS ORAL, Take 700 mg by mouth Three times a day PEPPERMINT LEAF, Disp: , Rfl:     HYDROcodone-acetaminophen (NORCO) 5-325 mg Oral Tablet, Take 1 Tablet by mouth Every 8 hours as needed for Pain, Disp: , Rfl:     HYDROcodone-acetaminophen (NORCO) 5-325 mg Oral Tablet, Take 1-2 Tablets by mouth Every 6 hours as needed for Pain, Disp: 20 Tablet, Rfl: 0    Ibuprofen (MOTRIN) 600 mg Oral Tablet, Take 1 Tablet (600 mg total) by mouth Four times a day as needed for Pain, Disp: , Rfl:     lactobacillus comb no.10 (PROBIOTIC) 20 billion cell Oral Capsule, Take 1 Capsule by mouth Once a day, Disp: ,  Rfl:     melatonin 3 mg Oral Capsule, Take 2 Capsules  (6 mg total) by mouth Every night, Disp: , Rfl:     metoprolol tartrate (LOPRESSOR) 25 mg Oral Tablet, Take 1 Tablet (25 mg total) by mouth Once a day, Disp: , Rfl:     pantoprazole (PROTONIX) 20 mg Oral Tablet, Delayed Release (E.C.), Take 1 Tablet (20 mg total) by mouth Every morning before breakfast, Disp: , Rfl:     predniSONE (DELTASONE) 10 mg Oral Tablet, 4 TABLETS QAM X 3 DAYS, THEN 3 TABLETS QAM X 3 DAYS, THEN 2 TABLETS QAM X 3 DAYS, THEN 1 TABLET QAM X 3 DAYS, Disp: 30 Tablet, Rfl: 0    QUEtiapine (SEROQUEL) 50 mg Oral Tablet, Take 1 Tablet (50 mg total) by mouth Every night, Disp: , Rfl:     simethicone (MYLICON) 80 mg Oral Tablet, Chewable, Chew 1 Tablet (80 mg total) Every 6 hours as needed (Patient not taking: Reported on 03/10/2022), Disp: , Rfl:     sumatriptan succinate (IMITREX) 100 mg Oral Tablet, Take 1 Tablet (100 mg total) by mouth Once, as needed for Migraine May repeat in 2 hours in needed, Disp: , Rfl:     tadalafil (CIALIS) 10 mg Oral Tablet, Take 1 Tablet (10 mg total) by mouth Every 24 hours as needed, Disp: 30 Tablet, Rfl: 11    tadalafil (CIALIS) 20 mg Oral Tablet, Take 1 Tablet (20 mg total) by mouth Every 24 hours as needed (Patient not taking: Reported on 03/15/2022), Disp: 30 Tablet, Rfl: 4    tamsulosin (FLOMAX) 0.4 mg Oral Capsule, Take 1 Capsule (0.4 mg total) by mouth Every evening after dinner, Disp: , Rfl:     Testosterone 1 % (25 mg/2.5gram) Transdermal Gel in Packet, Place 2 Each on the skin Once a day 2 pumps each shoulder daily am (Patient not taking: Reported on 03/15/2022), Disp: , Rfl:           Exam and Objective Findings     Nursing notes reviewed. Old records reviewed.    Filed Vitals:    03/28/22 0803 03/28/22 0804 03/28/22 0915 03/28/22 1015   BP: (!) 125/102  (!) 111/58 115/82   Pulse: 62  85 62   Resp: '20  13 13   '$ Temp:  36.9 C (98.5 F)  37.1 C (98.7 F)   SpO2: 96%  97% 98%       Physical Exam  Nursing note and vitals reviewed.  Vital signs reviewed as above.      Constitutional: Pt is well-developed and well-nourished.   Head: Normocephalic and atraumatic.   Eyes/ Ears/ Throat: Conjunctivae are normal. Pupils are equal, round, and reactive to light. EOM are intact. TMs normal. No pharyngeal erythema.  Neck: Soft, supple, full range of motion.  Cardiovascular: RRR. No Murmurs/rubs/gallops. Distal pulses present and equal bilaterally.  Pulmonary/Chest: Normal BS BL with no distress. No audible wheezes or crackles are noted.  GI/ Abdominal: Soft, nontender, nondistended. No rebound, guarding, or masses.  Musculoskeletal: + FROM. 5/5 muscle strength noted in all extremities with intact sensation. Normal range of motion. No deformities. Exhibits no edema and no tenderness.   Neurological: CNs 2-12 grossly intact. No focal deficits noted.  Skin: Capillary refill < 3 seconds. Warm and dry. No rash or lesions  Psychiatric: Patient has a normal mood and affect.     Work-up:  Orders Placed This Encounter    CT LUMBAR SPINE WO IV CONTRAST    CBC/DIFF  COMPREHENSIVE METABOLIC PANEL, NON-FASTING    URINALYSIS, MACROSCOPIC    CBC WITH DIFF    dexamethasone 4 mg/mL injection    fentaNYL (SUBLIMAZE) 50 mcg/mL injection    LORazepam (ATIVAN) 2 mg/mL injection    predniSONE (DELTASONE) 10 mg Oral Tablet    HYDROcodone-acetaminophen (NORCO) 5-325 mg Oral Tablet    cyclobenzaprine (FLEXERIL) 10 mg Oral Tablet        Labs:  Results for orders placed or performed during the hospital encounter of 03/28/22 (from the past 24 hour(s))   CBC/DIFF    Narrative    The following orders were created for panel order CBC/DIFF.  Procedure                               Abnormality         Status                     ---------                               -----------         ------                     CBC WITH DIFF[[id number]]                Abnormal            Final result                 Please view results for these tests on the individual orders.   COMPREHENSIVE METABOLIC PANEL, NON-FASTING   Result  Value Ref Range    SODIUM 139 136 - 145 mmol/L    POTASSIUM 4.4 3.5 - 5.1 mmol/L    CHLORIDE 104 96 - 111 mmol/L    CO2 TOTAL 28 23 - 31 mmol/L    ANION GAP 7 4 - 13 mmol/L    BUN 23 8 - 25 mg/dL    CREATININE 0.98 0.75 - 1.35 mg/dL    BUN/CREA RATIO 23 (H) 6 - 22    ESTIMATED GFR 84 >=60 mL/min/BSA    ALBUMIN 4.1 3.4 - 4.8 g/dL     CALCIUM 9.0 8.8 - 10.2 mg/dL    GLUCOSE 103 65 - 125 mg/dL    ALKALINE PHOSPHATASE 67 45 - 115 U/L    ALT (SGPT) 27 10 - 55 U/L    AST (SGOT)  18 8 - 45 U/L    BILIRUBIN TOTAL 0.5 0.3 - 1.3 mg/dL    PROTEIN TOTAL 6.9 6.0 - 8.0 g/dL   URINALYSIS, MACROSCOPIC   Result Value Ref Range    COLOR Yellow Colorless, Straw, Yellow    APPEARANCE Clear Clear    SPECIFIC GRAVITY 1.023 >1.005 - <1.030    PH 5.0 (L) >5.0 - <8.0    PROTEIN Not Detected Not Detected mg/dL    GLUCOSE Not Detected Not Detected mg/dL    KETONES Not Detected Not Detected mg/dL    UROBILINOGEN Not Detected Not Detected mg/dL    BILIRUBIN Not Detected Not Detected mg/dL    BLOOD Not Detected Not Detected mg/dL    NITRITE Not Detected Not Detected    LEUKOCYTES Not Detected Not Detected WBCs/uL   CBC WITH DIFF   Result Value Ref Range    WBC 6.1 3.7 - 11.0 x10^3/uL  RBC 5.76 4.50 - 6.10 x10^6/uL    HGB 17.6 (H) 13.4 - 17.5 g/dL    HCT 52.8 (H) 38.9 - 52.0 %    MCV 91.7 78.0 - 100.0 fL    MCH 30.6 26.0 - 32.0 pg    MCHC 33.3 31.0 - 35.5 g/dL    RDW-CV 12.6 11.5 - 15.5 %    PLATELETS 222 150 - 400 x10^3/uL    MPV 9.8 8.7 - 12.5 fL    NEUTROPHIL % 62 %    LYMPHOCYTE % 25 %    MONOCYTE % 9 %    EOSINOPHIL % 4 %    BASOPHIL % 0 %    NEUTROPHIL # 3.72 1.50 - 7.70 x10^3/uL    LYMPHOCYTE # 1.50 1.00 - 4.80 x10^3/uL    MONOCYTE # 0.57 0.20 - 1.10 x10^3/uL    EOSINOPHIL # 0.24 <=0.50 x10^3/uL    BASOPHIL # <0.10 <=0.20 x10^3/uL    IMMATURE GRANULOCYTE % 0 0 - 1 %    IMMATURE GRANULOCYTE # <0.10 <0.10 x10^3/uL       Imaging:   Results for orders placed or performed during the hospital encounter of 03/28/22 (from the past 72 hour(s))    CT LUMBAR SPINE WO IV CONTRAST     Status: None    Narrative    EXAM: CT LUMBAR SPINE WO IV CONTRAST    HISTORY: low back pain radiating to left leg    CT Dose:  605 DLP (mGy*cm)  This CT scanner is equipped with dose reducing technology. The mAs is automatically adjusted to patient's body size in order to deliver the lowest dose possible.     FINDINGS: Axial CT imaging of the lumbar spine was performed along with sagittal and coronal reformats. The patient is mildly osteopenic. Vertebral body heights are preserved. There is straightening of the normal lordosis. Patient does have postoperative change of fusion at the L3-5 levels including bilateral transpedicular screws, rods and interposition disc implants. There is resection of the L3 spinous process along with laminectomy at L4 and hemilaminectomy at L5. Bony sclerosis is noted at the operative bed. There is no evidence to suggest hardware failure. There is lucency surrounding the L5 screws consistent with minor loosening. There is also loss of disc height and sclerotic endplate change at the H0-3 disc level and there is slight retropositioning of L2 on L3. No evidence of displaced fracture or acute bony process is seen. There is some spurring at the L1-2 and L2-3 levels posteriorly. Some very mild central narrowing. The neural foramen also show bilateral narrowing at the L2-3 level due to positioning of L2 on 3 and bony spurring. There is also moderate right-sided neural foraminal narrowing at the L4-5 level.      Impression    1. Osteopenia, degenerative and postoperative changes are noted without an acute bony process. There is some minor loosening of the L5 transpedicular screws.  2. Patient also has some bony spurring creating mild central narrowing at L1-2 and L2-3. Patient also has neural foraminal narrowing which is most pronounced at the L2-3 level and on the right at L4-5.  3. No acute bony process.      Radiologist location ID: [id number]          Abnormal Lab results:  Labs Ordered/Reviewed   COMPREHENSIVE METABOLIC PANEL, NON-FASTING - Abnormal; Notable for the following components:       Result Value    BUN/CREA RATIO 23 (*)     All  other components within normal limits   URINALYSIS, MACROSCOPIC - Abnormal; Notable for the following components:    PH 5.0 (*)     All other components within normal limits   CBC WITH DIFF - Abnormal; Notable for the following components:    HGB 17.6 (*)     HCT 52.8 (*)     All other components within normal limits   CBC/DIFF    Narrative:     The following orders were created for panel order CBC/DIFF.                  Procedure                               Abnormality         Status                                     ---------                               -----------         ------                                     CBC WITH DIFF[[id number]]                Abnormal            Final result                                                 Please view results for these tests on the individual orders.       Assessment & Plan     Plan: Appropriate labs and imaging ordered. Medical Records reviewed.    MDM:   During the patient's stay in the emergency department, the above listed information was used to assist with medical decision making and were reviewed by myself when available for review.  ED Course    Pt remained stable throughout the emergency department course.      Medical Decision Making  See HPI, PMHx, PE, and ROS additional information.     Amount and/or Complexity of Data Reviewed  Labs: ordered.  Radiology: ordered.    Risk  Prescription drug management.  Parenteral controlled substances.        Disposition   Impression:   Diagnosis       Diagnosis Comment Added By Time Added    Sciatica  Karie Kirks, MD 03/28/2022 10:07 AM          Disposition:  Discharged    Medication instructions were discussed with the patient  It was advised that the patient return to the ED with any new, concerning or worsening  symptoms and follow up as directed.   The patient verbalized understanding of all instructions and had no further questions or concerns.     Follow up:   Doristine Johns, [address]  [place]  [place] [address]  [phone]    In 1 week  Prescriptions:   Discharge Medication List as of 03/28/2022 10:08 AM        START taking these medications    Details   !! cyclobenzaprine (FLEXERIL) 10 mg Oral Tablet Take 1 Tablet (10 mg total) by mouth Three times a day for 7 days, Disp-21 Tablet, R-0, Print      !! HYDROcodone-acetaminophen (NORCO) 5-325 mg Oral Tablet Take 1-2 Tablets by mouth Every 6 hours as needed for Pain, Disp-20 Tablet, R-0, Print      predniSONE (DELTASONE) 10 mg Oral Tablet 4 TABLETS QAM X 3 DAYS, THEN 3 TABLETS QAM X 3 DAYS, THEN 2 TABLETS QAM X 3 DAYS, THEN 1 TABLET QAM X 3 DAYS, Disp-30 Tablet, R-0, Print       !! - Potential duplicate medications found. Please discuss with provider.        Narcotics Discussion:    A comprehensive pain management plan for the patient was developed and discussed with the patient. The patient's previous experience with non-opioid and opioid medications was reviewed. The patient's experience with non-pharmacological/alternative therapies for pain management was reviewed. The patient's history in regard to substance abuse was reviewed.  Information from the Controlled Substance Monitoring Program was accessed.   Alternative treatments, non-pharmacological pain management options, were prescribed as a part of the comprehensive the pain management treatment plan.  The risks, benefits, and availability of these alternative treatment options were reviewed.  Opioid pain management options were discussed, including the risks and benefits of these options.  Specifically, the patient was advised that opioids are highly addictive, even when taken as prescribed, that there is a risk of developing a physical or psychological dependence on the controlled substance,  and that the risks of taking more opioids than prescribed, or mixing sedatives, benzodiazepines, or alcohol with opioids, can result in fatal respiratory depression.  It was determined that an opioid prescription medication is a necessary and appropriate part of the patient's comprehensive pain management plan.    The patient was advised they would be receiving a prescription for  !! HYDROcodone-acetaminophen (NORCO) 5-325 mg Oral Tablet doses of 20 at discharge.     The patient was advised they could fill the prescription for a lesser amount if they chose.  They were advised if they did choose to partially fill the prescription they might not be able to get an addition opioid prescription for several days if they ran out of medications.  The patient and/or their guardian were given an opportunity to ask questions about the comprehensive pain management plan and their questions were answered.      I am scribing for, and in the presence of, Dr. Maida Sale for services provided on 03/28/2022.  Floy Sabina, [place], [place]  03/28/2022, 08:10    {*** dont forget .tgscribe AND .sign***}    This note was partially generated using MModal Fluency Direct system, and there may be some incorrect words, spellings, and punctuation that were not noted in checking the note before saving.

## 2022-03-28 NOTE — ED Triage Notes (Signed)
Pt reports a hx of a back surgery approx 5 years ago and states for the past 5 days he has been having increasing back pain that radiates into the left leg.

## 2022-04-06 ENCOUNTER — Encounter (HOSPITAL_COMMUNITY): Admission: RE | Payer: Self-pay | Source: Ambulatory Visit

## 2022-04-06 ENCOUNTER — Inpatient Hospital Stay (HOSPITAL_COMMUNITY): Admission: RE | Admit: 2022-04-06 | Payer: Medicare Other | Source: Ambulatory Visit | Admitting: Pain Medicine

## 2022-04-06 SURGERY — ~~LOC~~ OR MINOR EPIDURAL STEROID INJECTION
Anesthesia: Local (Nurse-Monitored)

## 2022-04-20 ENCOUNTER — Ambulatory Visit: Payer: 59 | Attending: Neurological Surgery | Admitting: Neurological Surgery

## 2022-04-20 ENCOUNTER — Inpatient Hospital Stay (HOSPITAL_BASED_OUTPATIENT_CLINIC_OR_DEPARTMENT_OTHER)
Admission: RE | Admit: 2022-04-20 | Discharge: 2022-04-20 | Disposition: A | Discharge status: Home / Self Care | Payer: 59 | Source: Ambulatory Visit

## 2022-04-20 ENCOUNTER — Other Ambulatory Visit: Payer: Self-pay

## 2022-04-20 ENCOUNTER — Encounter (HOSPITAL_BASED_OUTPATIENT_CLINIC_OR_DEPARTMENT_OTHER): Payer: Self-pay | Admitting: Neurological Surgery

## 2022-04-20 DIAGNOSIS — M5459 Other low back pain: Secondary | ICD-10-CM

## 2022-04-20 DIAGNOSIS — M4802 Spinal stenosis, cervical region: Secondary | ICD-10-CM

## 2022-04-20 DIAGNOSIS — Z981 Arthrodesis status: Secondary | ICD-10-CM | POA: Insufficient documentation

## 2022-04-20 DIAGNOSIS — M545 Low back pain, unspecified: Secondary | ICD-10-CM

## 2022-04-20 DIAGNOSIS — M47812 Spondylosis without myelopathy or radiculopathy, cervical region: Secondary | ICD-10-CM | POA: Insufficient documentation

## 2022-04-20 DIAGNOSIS — M5416 Radiculopathy, lumbar region: Secondary | ICD-10-CM

## 2022-04-20 DIAGNOSIS — M5412 Radiculopathy, cervical region: Secondary | ICD-10-CM | POA: Insufficient documentation

## 2022-04-20 DIAGNOSIS — M542 Cervicalgia: Secondary | ICD-10-CM | POA: Insufficient documentation

## 2022-04-20 NOTE — Progress Notes (Signed)
Department of Neurosurgery      Assessment/Plan:     ICD-10-CM    1. 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    The lumbar CT shows an L3-5 fusion with haloing around the left L5 screw.  The HU are consistently > 200.    Clinically he does have a right C6 radiculopathy.  He will undergo a cervical ESI and follow up.  If the arm pain persists, I will consider a C5-6 ACDF.    He also has a left L3 radiculopathy.  He will undergo dynamic x-rays of his lumbar spine and an MRI to determine if there is any instability or adjacent level disease.            Ronni Rumble, MD  Assistant Professor  [facility]   04/20/2022 9:10 AM     This patient was seen independently, under my direct supervision by Martinique Blevins NP.  I have reviewed the case and reviewed pertinent testing with them.  Please see their note for details of the visit.  I agree with their assessment/plan with the addition of the following summary.    _______________________________________________________

## 2022-04-20 NOTE — H&P (Signed)
Department of Neurosurgery  Consultation    Name:  Sean Wall     MR#:    [id number]   DOB:  Jun 24, 1956     Date/Time: 04/20/2022  10:34  ______________________________________________________________________    Assessment:   ENCOUNTER DIAGNOSES     ICD-10-CM   1. Cervical spondylosis  M47.812   2. History of lumbar fusion  Z98.1   3. Low back pain  M54.50   4. Lumbar radiculopathy  M54.16   5. Cervical radiculopathy  M54.12   6. Neck pain  M54.2     MRI cervical spine performed 03/02/2022.  At the level of C5-6 there is a broad-based disc protrusion and facet hypertrophy that results bilateral foraminal narrowing to a moderate degree.  There is otherwise no significant central or foraminal stenosis throughout    CT of the lumbar spine performed 03/29/2022.  There does appear to be some mild haloing around the left L5 screw, however overall the hardware appears intact     Plan: Imaging reviewed and physical examination completed.  MRI of the cervical spine does reveal bilateral foraminal stenosis at C5-6.  We will set the patient up for a CESI with our pain clinic.  Today we will obtain flexion and extension x-rays of the lumbar spine to rule out any hypermobility.  We will also obtain an MRI of the lumbar spine with and without contrast to evaluate for any neural element compromise given the patient's complaints of an L3 radiculopathy in the left lower extremity and history of fusion.  The patient will follow up around 2-4 weeks after the injection is completed    The patient was evaluated independently in clinic    [place] Blevins, APRN,NP-C  Department of Neurosurgery   04/20/2022 10:34 AM   _______________________________________________________________________    Chief Complaint:  Neck pain, Back pain, Arm pain, Leg pain, and Numbness    History of Present Illness:  Sean Wall  is a 66 y.o. male who has been referred to the [facility] with a 2 month history of low back pain that radiates into the  left anterior thigh in an L3 distribution.  The patient states he has pain, numbness, and tingling in this distribution.  The patient also has complaints of neck pain that radiates down the right arm and diffusely into the right hand.  He also describes having numbness, tingling, and pain in this distribution.  He states that his neck pain and right arm radiculopathy have been ongoing since December of 2022.  Currently the patient rates his pain as a 7/10 on the pain scale.  He describes his pain as aching, stabbing, burning in nature.  He states that sitting, bending, standing for prolonged periods of time make his symptoms worse and that lying down to rest and utilizing hydrocodone and cyclobenzaprine seem to give him the most relief.  He states that his symptoms at its worse is a 10/10 on the pain scale.  The patient does note that he had a lumbar fusion from L3-L5 and 2018 in [place].  Over recent time the patient denies any chiropractic manipulation, physical therapy, or injections.  The patient does utilize ibuprofen, Flexeril, Norco, Neurontin, and a steroid Dosepak that he received during an ER visit.  Currently the patient denies sensory changes to bowel or bladder and denies saddle anesthesias.  The patient does not describe symptoms consistent with cervical myelopathy.    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    HTN (hypertension)     Hyperlipidemia     Ischemic colitis (CMS HCC)     Sleep apnea     Tinnitus           Past Surgical History:   Procedure Laterality Date    COLONOSCOPY  04/09/2021    ELBOW SURGERY Left     HX APPENDECTOMY       HX CARPAL TUNNEL RELEASE Bilateral     HX LUMBAR FUSION      hardware in place    HX SHOULDER SURGERY Bilateral     5 surgeries on right and 3 on the left    HX TURP      HX VASECTOMY      INGUINAL HERNIA REPAIR Right     LASIK Bilateral             Family History:  Family Medical History:       Problem Relation (Age of Onset)    No Known Problems Mother, Father, Sister, Brother, Half-Sister, Half-Brother, Maternal Aunt, Maternal Uncle, Paternal 18, Paternal Uncle, Maternal Grandmother, Maternal Grandfather, Paternal 61, Paternal 4, Daughter, Son, Other              Allergies:  Allergies   Allergen Reactions    Propofol Rash    Lithium      Blurred vision       Home Medications:  Prior to Admission medications    Medication Sig Start Date End Date Taking? Authorizing Provider   atorvastatin (LIPITOR) 40 mg Oral Tablet Take 1 Tablet (40 mg total) by mouth Every evening    Provider, Historical   azelastine-fluticasone 137-50 mcg/spray Nasal Spray, Non-Aerosol Administer 2 Sprays into affected nostril(s) Every night    Provider, Historical   bisacodyL (DULCOLAX) 5 mg Oral Tablet, Delayed Release (E.C.) Take 2 Tablets (10 mg total) by mouth Every 24 hours as needed for Constipation for up to 1 dose To be taken in the evening 02/07/22   Brooks Sailors, MD   buPROPion (WELLBUTRIN XL) 300 mg extended release 24 hr tablet Take 1 Tablet (300 mg total) by mouth Once a day    Provider, Historical   cetirizine (ZYRTEC) 10 mg Oral Tablet Take 1 Tablet (10 mg total) by mouth Once a day    Provider, Historical   cyanocobalamin (VITAMIN B12) 1,000 mcg/mL Injection Solution 1 mL (1,000 mcg total) Every 30 days    Provider, Historical   cyclobenzaprine (FLEXERIL) 10 mg Oral Tablet Take 1 Tablet (10 mg total) by mouth Three times a day as needed    Provider, Historical   cyclobenzaprine (FLEXERIL) 10 mg Oral Tablet Take 1 Tablet (10 mg total) by mouth Three times a day for 7 days 03/28/22 04/04/22  Karie Kirks, MD   ferrous sulfate (FERATAB) 324 mg (65 mg iron) Oral Tablet, Delayed Release (E.C.) Take 1 Tablet (324 mg total) by mouth Every other day On Monday, Wednesday, friday  Patient not taking: Reported on 03/10/2022    Provider, Historical   finasteride (PROSCAR) 5 mg Oral Tablet Take 1  Tablet (5 mg total) by mouth Once a day    Provider, Historical   gabapentin (NEURONTIN) 300 mg Oral Capsule Take 1 Capsule (300 mg total) by mouth Three times a day    Provider, Historical   HERBAL DRUGS ORAL Take 700 mg by mouth Three times a day PEPPERMINT LEAF    Provider, Historical   HYDROcodone-acetaminophen (NORCO) 5-325 mg Oral Tablet Take 1 Tablet by mouth Every 8 hours as needed for Pain    Provider, Historical   HYDROcodone-acetaminophen (NORCO) 5-325 mg Oral Tablet Take 1-2 Tablets by mouth Every 6 hours as needed for Pain 03/28/22   Karie Kirks, MD   Ibuprofen (MOTRIN) 600 mg Oral Tablet Take 1 Tablet (600 mg total) by mouth Four times a day as needed for Pain    Provider, Historical   lactobacillus comb no.10 (PROBIOTIC) 20 billion cell Oral Capsule Take 1 Capsule by mouth Once a day    Provider, Historical   melatonin 3 mg Oral Capsule Take 2 Capsules (6 mg total) by mouth Every night    Provider, Historical   metoprolol tartrate (LOPRESSOR) 25 mg Oral Tablet Take 1 Tablet (25 mg total) by mouth Once a day    Provider, Historical   pantoprazole (PROTONIX) 20 mg Oral Tablet, Delayed Release (E.C.) Take 1 Tablet (20 mg total) by mouth Every morning before breakfast    Provider, Historical   predniSONE (DELTASONE) 10 mg Oral Tablet 4 TABLETS QAM X 3 DAYS, THEN 3 TABLETS QAM X 3 DAYS, THEN 2 TABLETS QAM X 3 DAYS, THEN 1 TABLET QAM X 3 DAYS 03/28/22   Karie Kirks, MD   QUEtiapine (SEROQUEL) 50 mg Oral Tablet Take 1 Tablet (50 mg total) by mouth Every night    Provider, Historical   simethicone (MYLICON) 80 mg Oral Tablet, Chewable Chew 1 Tablet (80 mg total) Every 6 hours as needed  Patient not taking:  Reported on 03/10/2022    Provider, Historical   sumatriptan succinate (IMITREX) 100 mg Oral Tablet Take 1 Tablet (100 mg total) by mouth Once, as needed for Migraine May repeat in 2 hours in needed    Provider, Historical   tadalafil (CIALIS) 10 mg Oral Tablet Take 1 Tablet (10 mg total) by mouth Every 24 hours as needed 02/09/22 02/09/23  Dice, Lurena Joiner, PA-C   tadalafil (CIALIS) 20 mg Oral Tablet Take 1 Tablet (20 mg total) by mouth Every 24 hours as needed  Patient not taking: Reported on 03/15/2022 03/10/22   Williemae Natter, MD   tamsulosin [facility]) 0.4 mg Oral Capsule Take 1 Capsule (0.4 mg total) by mouth Every evening after dinner    Provider, Historical   Testosterone 1 % (25 mg/2.5gram) Transdermal Gel in Packet Place 2 Each on the skin Once a day 2 pumps each shoulder daily am  Patient not taking: Reported on 03/15/2022    Provider, Historical       Social History:   Social History     Tobacco Use    Smoking status: Light Smoker     Types: Cigars    Smokeless tobacco: Never    Tobacco comments:     OCCASIONAL SMOKING-1 CIGAR EVERY 2 WEEKS   Vaping Use    Vaping Use: Never used   Substance Use Topics    Alcohol use: Yes     Alcohol/week: 21.0 standard drinks     Types: 21 Cans of beer per week     Comment: 2 drinks daily  Drug use: Never        Review of Systems:  Constitutional: no weight loss, fever, night sweats  GI: No change in bowel habits, No Nausea, vomitting, diarrhea, or constipation  GU: Negative for dysuria, frequency and incontinence  Neuro: positive for paresthesia    Physical Exam:  BP 118/76   Pulse 64   Temp 36.5 C (97.7 F)   Resp 18   Ht 1.778 m ('5\' 10"'$ )   Wt 94.3 kg (207 lb 14.3 oz)   SpO2 99%   BMI 29.83 kg/m         Lumbar Spine:   Palpation:  Tenderness along the paraspinal musculature  ROM:  Range of motion is limited by pain  Straight leg raise: Negative    Cervical:   Palpation:  Tenderness along the paraspinal musculature, Tenderness along the trapezius muscle  ROM:  Range of  motion is limited by pain  Spurling's: Positive right    Shoulder:  Palpation:  +Mild TTP right shoulder  ROM: Full active range of motion  Jobe's (Empty Can): Negative  Cross Arm Test: Negative     Neurologic Exam:     Alert and oriented x3 with normal speech.    Sensation:  Intact to light touch, Intact to proprioception, Intact to pain in upper extremities  Intact to light touch, Intact to proprioception, Intact to pain in lower extremities.    Gait: Normal    Motor examination:   Arm Right Left Leg Right Left   Deltoid (C5) 5/5 5/5 Iliopsoas (L2) 5/5 5/5   Bicep 5/5 5/5      Wrist extension (C6) 5/5 5/5 Quadricep (L3/4) 5/5 5/5   Tricep (C7) 5/5 5/5 Anterior Tibialis (L5) 5/5 5/5   Finger flexion (C8) 5/5 5/5 Gastroc-soleus (S1) 5/5 5/5       Reflexes:    Bicep BR  Patella Achilles Babinski Ankle Clonus Hoffman's   Right 2+ 2+  2+ 2+ Downgoing Not Present Not Present   Left 2+ 2+  2+ 2+ Downgoing Not Present Not Present        Imaging:  I reviewed the study(ies) myself and made my own interpretation.  My interpretation can be found in the HPI and/or Assessment.      All portions of the chart was reviewed, including those that are automatically propagated. Any history that could not be obtained by the patient was done so through chart review. This note was partially generated using MModal Fluency Direct system, and there may be some incorrect words, spellings, and punctuation that were not noted in checking the note before saving.

## 2022-04-21 ENCOUNTER — Telehealth (HOSPITAL_BASED_OUTPATIENT_CLINIC_OR_DEPARTMENT_OTHER): Payer: Self-pay | Admitting: Neurological Surgery

## 2022-04-21 ENCOUNTER — Other Ambulatory Visit: Payer: Self-pay

## 2022-04-21 NOTE — Telephone Encounter (Signed)
Called patient let know that MRI is scheduled for 05/04/22 @ 1230PM @ [facility] will need to arrive 15 mins prior to appointment. Patient agreed with the above information.  Alejandro Mulling, MA'

## 2022-04-26 ENCOUNTER — Ambulatory Visit (HOSPITAL_BASED_OUTPATIENT_CLINIC_OR_DEPARTMENT_OTHER): Payer: Self-pay

## 2022-04-28 ENCOUNTER — Encounter (HOSPITAL_BASED_OUTPATIENT_CLINIC_OR_DEPARTMENT_OTHER): Payer: Self-pay

## 2022-04-28 ENCOUNTER — Telehealth (HOSPITAL_BASED_OUTPATIENT_CLINIC_OR_DEPARTMENT_OTHER): Payer: Self-pay | Admitting: Neurological Surgery

## 2022-04-28 NOTE — Telephone Encounter (Addendum)
Sent message to patient via MyChart to let him know the information below.  Adele Dan Dotson    ----- Message from Martinique Blevins, [place] sent at 04/28/2022  3:32 PM EDT -----  He just had a 15 day supply of steroids prescribed 03/28/22.  He should wait until he sees the pain clinic and get the ok from them for oral steroids since he is supposed to have an ESI. Thanks    Martinique  ----- Message -----  From: Laverna Peace  Sent: 04/28/2022   3:21 PM EDT  To: Martinique Blevins, APRN,NP-C    Martinique,    This patient called in and stated that he is in severe pain and wants to know if he can be put on steroids until he gets his testing done.    Thanks,  [employer] Dotson

## 2022-04-28 NOTE — Telephone Encounter (Signed)
Patient called in and left message on my phone stating that he is in severe pain and wants to know if he can be put on steroids until his testing. I sent message to Martinique Blevins NP.  Adele Dan [employer]

## 2022-04-29 ENCOUNTER — Ambulatory Visit (HOSPITAL_BASED_OUTPATIENT_CLINIC_OR_DEPARTMENT_OTHER): Payer: Medicare (Managed Care) | Admitting: Student in an Organized Health Care Education/Training Program

## 2022-05-03 ENCOUNTER — Other Ambulatory Visit (HOSPITAL_BASED_OUTPATIENT_CLINIC_OR_DEPARTMENT_OTHER): Payer: Self-pay | Admitting: PHYSICIAN ASSISTANT

## 2022-05-03 ENCOUNTER — Telehealth (HOSPITAL_BASED_OUTPATIENT_CLINIC_OR_DEPARTMENT_OTHER): Payer: Self-pay | Admitting: Neurological Surgery

## 2022-05-03 DIAGNOSIS — Z01818 Encounter for other preprocedural examination: Secondary | ICD-10-CM

## 2022-05-03 NOTE — Telephone Encounter (Signed)
Amanda from [facility] called in and said patient is on her schedule for tomorrow, and she is needing a BUN/Creatinine order put in. Message sent to Martinique Blevins NP.   Orion Modest, MA

## 2022-05-04 ENCOUNTER — Inpatient Hospital Stay
Admission: RE | Admit: 2022-05-04 | Discharge: 2022-05-04 | Disposition: A | Discharge status: Home / Self Care | Payer: 59 | Source: Ambulatory Visit | Attending: PHYSICIAN ASSISTANT | Admitting: PHYSICIAN ASSISTANT

## 2022-05-04 ENCOUNTER — Other Ambulatory Visit (HOSPITAL_BASED_OUTPATIENT_CLINIC_OR_DEPARTMENT_OTHER): Payer: 59

## 2022-05-04 ENCOUNTER — Other Ambulatory Visit: Payer: Self-pay

## 2022-05-04 DIAGNOSIS — Z01818 Encounter for other preprocedural examination: Secondary | ICD-10-CM | POA: Insufficient documentation

## 2022-05-04 DIAGNOSIS — M5416 Radiculopathy, lumbar region: Secondary | ICD-10-CM | POA: Insufficient documentation

## 2022-05-04 DIAGNOSIS — Z981 Arthrodesis status: Secondary | ICD-10-CM | POA: Insufficient documentation

## 2022-05-04 DIAGNOSIS — M545 Low back pain, unspecified: Secondary | ICD-10-CM | POA: Insufficient documentation

## 2022-05-04 LAB — BASIC METABOLIC PANEL
ANION GAP: 10 mmol/L (ref 4–13)
BUN/CREA RATIO: 17 (ref 6–22)
BUN: 22 mg/dL (ref 8–25)
CALCIUM: 9.6 mg/dL (ref 8.6–10.3)
CHLORIDE: 104 mmol/L (ref 96–111)
CO2 TOTAL: 27 mmol/L (ref 23–31)
CREATININE: 1.26 mg/dL (ref 0.75–1.35)
ESTIMATED GFR: 62 mL/min/BSA (ref >=60)
GLUCOSE: 112 mg/dL (ref 65–125)
POTASSIUM: 4.4 mmol/L (ref 3.5–5.1)
SODIUM: 141 mmol/L (ref 136–145)

## 2022-05-04 MED ORDER — GADOBENATE DIMEGLUMINE 529 MG/ML(0.1 MMOL/0.2 ML) INTRAVENOUS SOLUTION
15.0000 mL | INTRAVENOUS | Status: AC
Start: 2022-05-04 — End: 2022-05-04
  Administered 2022-05-04: 15 mL via INTRAVENOUS

## 2022-05-05 ENCOUNTER — Other Ambulatory Visit: Payer: Self-pay

## 2022-05-05 ENCOUNTER — Ambulatory Visit: Payer: 59 | Attending: Pain Medicine | Admitting: Pain Medicine

## 2022-05-05 ENCOUNTER — Encounter (HOSPITAL_BASED_OUTPATIENT_CLINIC_OR_DEPARTMENT_OTHER): Payer: Self-pay | Admitting: Pain Medicine

## 2022-05-05 VITALS — BP 124/82 | HR 57 | Temp 97.6°F | Resp 16 | Ht 70.0 in | Wt 202.8 lb

## 2022-05-05 DIAGNOSIS — M542 Cervicalgia: Secondary | ICD-10-CM | POA: Insufficient documentation

## 2022-05-05 DIAGNOSIS — M4802 Spinal stenosis, cervical region: Secondary | ICD-10-CM | POA: Insufficient documentation

## 2022-05-05 DIAGNOSIS — M5020 Other cervical disc displacement, unspecified cervical region: Secondary | ICD-10-CM | POA: Insufficient documentation

## 2022-05-05 DIAGNOSIS — M50221 Other cervical disc displacement at C4-C5 level: Secondary | ICD-10-CM

## 2022-05-05 DIAGNOSIS — M5412 Radiculopathy, cervical region: Secondary | ICD-10-CM | POA: Insufficient documentation

## 2022-05-05 DIAGNOSIS — M4722 Other spondylosis with radiculopathy, cervical region: Secondary | ICD-10-CM

## 2022-05-05 DIAGNOSIS — M47812 Spondylosis without myelopathy or radiculopathy, cervical region: Secondary | ICD-10-CM | POA: Insufficient documentation

## 2022-05-05 DIAGNOSIS — G8929 Other chronic pain: Secondary | ICD-10-CM | POA: Insufficient documentation

## 2022-05-05 NOTE — Nursing Note (Signed)
Pain and Function:     [place] Pain Rating Scale     On a scale of 0-10, during the past 24 hours, pain has interfered with you usual activity: 7     On a scale of 0-10, during the past 24 hours, pain has interfered with your sleep: 7    On a scale of 0-10, during the past 24 hours, pain has affected your mood: 7     On a scale of 0-10, during the past 24 hours, pain has contributed to your stress: 7     On a scale of 0-10, what is your overall pain Rating: 9

## 2022-05-05 NOTE — H&P (Signed)
[facility]  Pain Management, [facility]  [place] [address]  [phone]    PATIENT NAME:  Sean Wall                                             MEDICAL RECORD NUMBER:  [id number]  DATE OF BIRTH:  Sep 22, 1955      PRIMARY CARE PHYSICIAN:  [name], MD  REFERRING PHYSICIAN:  Martinique Blevins, [place]*  DATE OF VISIT:  May 05, 2022                                                                                          HISTORY AND PHYSICAL EXAMINATION    REASON FOR VISIT/CHIEF COMPLAINT:  Referral for: Neck Pain and Low back Pain     HPI:  Patient is a 66 y.o.  male who is referred to Pain clinic by Neurosurgery for evaluation of Neck Pain. Patient also reports Low back pain, would like to focus on Neck pain first.     Pain onset was January 2023, it has been bothersome ongoing since.  Today, the pain is rated at a 9/10, and is located in the Neck with radiation of pain to the Right arm which appears to be right C6 radiculopathy.   - It is described as Aching, Shooting, Stabbing, Numb, and Stinging.  - It is exacerbated with sitting and standing and alleviated with sitting and rest.   -           Frequency of the pain is Daily with Fluctuations  - There is associated upper extremity weakness, numbness, and tingling  - The patient denies ongoing fever/chills, night sweats, unintended weight loss and bowel or bladder incontinence.  -           Medications tried include Gabapenin, Hydrocodone, oral steroids, and Flexeril  The pain does impact the quality of life and daily activities especially when it is severe.  Of note, patient comes to clinic today accompanied by his wife.     Treatments tried:  Therapy: PT/ Aquatherapy/Home exercises: Yes, 2018  Modalities:    Heat/Ice - Yes   TENS - No   Massage / Acupuncture - No              Chiropracter- No    Injections:     2018 L3-L5 lumbar fusion in [place] by Dr. Vertell Limber     Blood thinners:  None  Prescribed by: N/A     Have you had therapy for the condition you are being seen for today?: Yes  If yes from * to * (dates): 5 years ago  Are your symptoms worsening?: Yes  Are your symptoms worsening?: Yes  Do you have weakness in your arms/legs? : Yes  Do you have numbness/tingling in your arms/legs? 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    HTN (hypertension)     Hyperlipidemia     Ischemic colitis (CMS HCC)     Sleep apnea     Tinnitus            Patient Active Problem List   Diagnosis    Colitis    Hematochezia    Hypertension    GERD (gastroesophageal reflux disease)    BPH (benign prostatic hyperplasia)    Family history of migraine headaches    Depression    Tobacco use disorder    Ischemic colitis (CMS HCC)    Irritable bowel syndrome with diarrhea    History of colon polyps    Abdominal pain, unspecified abdominal location       Family Medical History:       Problem Relation (Age of Onset)    No Known Problems Mother, Father, Sister, Brother, Half-Sister, Half-Brother, Maternal Aunt, Maternal Uncle, Paternal 66, Paternal Uncle, Maternal Grandmother, Maternal Grandfather, Paternal Grandmother, Paternal Grandfather, Daughter, Son, Other              Medications:  atorvastatin (LIPITOR) 40 mg Oral Tablet, Take 1 Tablet (40 mg total) by mouth Every evening  azelastine-fluticasone 137-50 mcg/spray Nasal Spray, Non-Aerosol, Administer 2 Sprays into affected nostril(s) Every night  bisacodyL (DULCOLAX) 5 mg Oral Tablet, Delayed Release (E.C.),  Take 2 Tablets (10 mg total) by mouth Every 24 hours as needed for Constipation for up to 1 dose To be taken in the evening  buPROPion (WELLBUTRIN XL) 300 mg extended release 24 hr tablet, Take 1 Tablet (300 mg total) by mouth Once a day  cetirizine (ZYRTEC) 10 mg Oral Tablet, Take 1 Tablet (10 mg total) by mouth Once a day  cyanocobalamin (VITAMIN B12) 1,000 mcg/mL Injection Solution, 1 mL (1,000 mcg total) Every 30 days  cyclobenzaprine (FLEXERIL) 10 mg Oral Tablet, Take 1 Tablet (10 mg total) by mouth Three times a day as needed  dextroamphetamine-amphetamine (ADDERALL) 10 mg Oral Tablet, Take 1 Tablet (10 mg total) by mouth Once a day  ferrous sulfate (FERATAB) 324 mg (65 mg iron) Oral Tablet, Delayed Release (E.C.), Take 1 Tablet (324 mg total) by mouth Every other day On Monday, Wednesday, friday  finasteride (PROSCAR) 5 mg Oral Tablet, Take 1 Tablet (5 mg total) by mouth Once a day  gabapentin (NEURONTIN) 300 mg Oral Capsule, Take 1 Capsule (300 mg total) by mouth Three times a day  HERBAL DRUGS ORAL, Take 700 mg by mouth Three times a day PEPPERMINT LEAF  HYDROcodone-acetaminophen (NORCO) 5-325 mg Oral Tablet, Take 1 Tablet by mouth Every 8 hours as needed for Pain  HYDROcodone-acetaminophen (NORCO) 5-325 mg Oral Tablet, Take 1-2 Tablets by mouth Every 6 hours as needed for Pain  Ibuprofen (MOTRIN) 600 mg Oral Tablet, Take 1 Tablet (600 mg total) by mouth Four times a day as needed for Pain  lactobacillus comb no.10 (PROBIOTIC) 20 billion cell Oral Capsule, Take 1 Capsule by mouth Once a day  melatonin 3 mg Oral Capsule, Take 2 Capsules (6 mg total) by mouth Every  night  metoprolol tartrate (LOPRESSOR) 25 mg Oral Tablet, Take 1 Tablet (25 mg total) by mouth Once a day  pantoprazole (PROTONIX) 20 mg Oral Tablet, Delayed Release (E.C.), Take 1 Tablet (20 mg total) by mouth Every morning before breakfast  predniSONE (DELTASONE) 10 mg Oral Tablet, 4 TABLETS QAM X 3 DAYS, THEN 3 TABLETS QAM X 3 DAYS, THEN 2  TABLETS QAM X 3 DAYS, THEN 1 TABLET QAM X 3 DAYS (Patient not taking: Reported on 05/05/2022)  QUEtiapine (SEROQUEL) 50 mg Oral Tablet, Take 1 Tablet (50 mg total) by mouth Every night  simethicone (MYLICON) 80 mg Oral Tablet, Chewable, Chew 1 Tablet (80 mg total) Every 6 hours as needed  sumatriptan succinate (IMITREX) 100 mg Oral Tablet, Take 1 Tablet (100 mg total) by mouth Once, as needed for Migraine May repeat in 2 hours in needed  tadalafil (CIALIS) 10 mg Oral Tablet, Take 1 Tablet (10 mg total) by mouth Every 24 hours as needed (Patient taking differently: Take 0.5 Tablets (5 mg total) by mouth Every 24 hours as needed)  tadalafil (CIALIS) 20 mg Oral Tablet, Take 1 Tablet (20 mg total) by mouth Every 24 hours as needed  tamsulosin (FLOMAX) 0.4 mg Oral Capsule, Take 1 Capsule (0.4 mg total) by mouth Every evening after dinner  Testosterone 1 % (25 mg/2.5gram) Transdermal Gel in Packet, Place 2 Each on the skin Once a day 2 pumps each shoulder daily am    No facility-administered medications prior to visit.      Medication Allergies:  Propofol and Lithium      Social History     Socioeconomic History    Marital status: Married     Spouse name: Not on file    Number of children: Not on file    Years of education: Not on file    Highest education level: Not on file   Occupational History    Not on file   Tobacco Use    Smoking status: Light Smoker     Types: Cigars    Smokeless tobacco: Never    Tobacco comments:     OCCASIONAL SMOKING-1 CIGAR EVERY 2 WEEKS   Vaping Use    Vaping Use: Never used   Substance and Sexual Activity    Alcohol use: Yes     Alcohol/week: 21.0 standard drinks     Types: 21 Cans of beer per week     Comment: 2 drinks daily    Drug use: Never    Sexual activity: Not on file   Other Topics Concern    Ability to Walk 1 Flight of Steps without SOB/CP Yes    Routine Exercise Not Asked    Ability to Walk 2 Flight of Steps without SOB/CP Not Asked    Unable to Ambulate Not Asked    Total Care  Not Asked    Ability To Do Own ADL's Yes    Uses Walker Not Asked    Other Activity Level Not Asked    Uses Cane Not Asked   Social History Narrative    Not on file     Social Determinants of Health     Financial Resource Strain: Not on file   Transportation Needs: Not on file   Social Connections: Not on file   Intimate Partner Violence: Not on file   Housing Stability: Not on file         REVIEW OF SYSTEMS:   Review of Systems -   Comprehensive 10 system  review was completed, positive for eyeglasses, diverticulosis, arthritis, problems with sexual intercourse, scoliosis, depression, neck pain, and low back pain.     EXAM:    Vitals:    05/05/22 1039   BP: 124/82   Pulse: 57   Resp: 16   Temp: 36.4 C (97.6 F)   SpO2: 96%   Weight: 92 kg (202 lb 13.2 oz)   Height: 1.778 m ('5\' 10"'$ )   BMI: 29.16            Physical Exam     GENERAL:  The patient is a 66 y.o. male resting comfortably in a seated position in no apparent distress.  The patient demonstrates no pain behavior, symptom magnification or drug seeking behavior.  PSYCHIATRIC:  Speech is fluent.  Affect is appropriate.  The patient demonstrates no signs of hypersomnolence, sedation or confusion.  HEENT:  Normocephalic.  Atraumatic.  Pupils are appropriate size for room lighting.  Extraocular muscles intact.  Neck supple.  CARDIOVASCULAR:  Regular rate and rhythm.  Bilateral radial pulses intact.  RESPIRATORY:  Unlabored respirations.  No accessory muscle use noted.   ABDOMEN:  Soft.  Non-tender.  Non-distended.  UPPER EXTREMITY NEUROLOGIC:  Muscle strength is 5/5 on the right and 5/5 on the left in all major proximal and distal muscle groups of the upper extremities.  Biceps, triceps and brachioradialis reflexes are grossly intact bilaterally.  Sensation is grossly intact in all dermatomes of the upper extremities.  LOWER EXTREMITY NEUROLOGIC:  Muscle strength is 5/5 on the right and 5/5 on the left in all major proximal and distal muscle groups of the lower  extremities.  Achilles and patellar reflexes are grossly intact bilaterally.  Sensation is grossly intact in all dermatomes of the lower extremities.  MUSCULOSKELETAL:  The patient is able to rise from a seated to a standing position without significant difficulty and delay.  Gait is antalgic.      Provocative Maneuvers   Cervical Spurling Test: positive on  right  Cervical ROM: Intact   Cervical Tenderness: Negative   Facet Loading: Cervical negative     Lab A1C Results:  No results found for: HA1C    POCT A1C Results:       No data to display                No results found for: POCTHA1C  No results found for: Huetter       Imaging:  Recent Results (from the past [id number] hour(s))   MRI SPINE LUMBOSACRAL W/WO CONTRAST    Collection Time: 05/04/22  1:07 PM    Narrative    History: Low back pain.    TECHNIQUE: Multiplanar multi sequence MR imaging lumbar spine is performed with and without the use of intravenous contrast.    There are postoperative changes of L3 and L4 laminectomies with posterior stabilization from L3 through L5 causing transpedicular screws, spinal rods, and intervertebral disc prosthesis. Spinal hardware does result in magnetic sensibility artifact limiting the exam. Alignment is near-anatomic with slight straightening of the lumbar lordosis. Type I and type II degenerative endplate changes are seen with mild endplate invagination at L3-4 and L4-5. There is no fracture.    At the L1-2 level, there is narrowing and desiccation of intervertebral disc. There is generalized disc bulging and early facet joint arthrosis. This results in moderate right and mild left neural foraminal encroachment. There is effacement of the thecal sac with no significant spinal stenosis.    At the  L2-3 level, there is narrowing and desiccation of intervertebral disc. There is a large generalized disc bulge with prominent posterior osteophyte formation. This combines with facet joint arthrosis and prominent ligamentum  flavum to cause moderate right greater than left neural foraminal encroachment and moderate spinal canal stenosis. There is suspected impingement on the left L2 nerve root laterally.    At the L3-4 and L4-5 levels, the neural foramen appear patent bilaterally. There is good decompression of the thecal sac with no significant spinal stenosis.    At the L5-S1 level, there is mild disc bulging and posterior osteophyte formation. There is prominent facet joint arthrosis. This results in a moderate left and mild right neural foraminal encroachment. Of note, the thecal sac is extremely small at this level      Impression    1. Postoperative changes of posterior stabilization from L3 through L5 as described above. Alignment is near-anatomic with slight straightening of the lumbar lordosis.  2. 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  3. The findings are most pronounced at L2-3 where there is moderate spinal canal stenosis and bilateral neural foraminal encroachment secondary to disc bulging, osteophyte formation, facet joint arthrosis, and prominent ligamentum flavum. There is suspected impingement on the left L2 nerve root laterally at this level.      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      FINDINGS: There is normal alignment of cervical spine. No fracture or dislocation is seen, with vertebral body heights preserved. Bone marrow signal is within normal limits. Focal fat density of the C2 vertebral body is likely a small  hemangioma.    At C2-C3 there is no spinal canal or neural foraminal stenosis.    At C3-C4 facet arthropathy results in mild bilateral neural foraminal narrowing. No spinal canal narrowing is seen.    At C4-C5 uncovertebral and facet arthropathy results in moderate left and mild right neural foraminal narrowing. A central left paracentral disc herniation results in mild spinal canal narrowing.    At C5-C6 there is central and paracentral zone disc herniation resulting in mild to moderate spinal canal narrowing. There is moderate bilateral neural foraminal narrowing due to uncovertebral and facet osteoarthropathy.    At C6-C7 there is moderate bilateral neural foraminal narrowing. No spinal canal narrowing is seen.    At C7-T1 there is no spinal canal narrowing. Mild bilateral neural foraminal narrowing is seen.    No abnormal signal is seen in the spinal cord. The soft tissues of the neck are unremarkable.      Impression    Mild to moderate degenerative changes of the cervical spine, with mild to moderate spinal canal narrowing at C5-C6 as well as moderate neural foraminal narrowing on the left at C4-C5 and bilaterally at C5-C6 and C6-C7.            Radiologist location ID: [id number]         IMPRESSION:    Rumaldo was seen today for new patient and neck pain.  Diagnoses and all orders for this visit:    Cervical spondylosis  -     Refer to [facility]    Cervical radiculopathy  -     Refer to [facility]    Patient is a 66 y.o.  male who was seen  today for evaluation of Neck pain with radiation into arm/s - right.     Given the patient's symptoms, examination findings, and imaging results noted above, I discussed the utility of proceeding with a  Right C7-T1 interlaminar epidural steroid injection under fluoroscopic guidance.  Using an anatomical model, the procedure, as well as its potential risks, benefits, and reasonable alternatives were discussed in detail.   Discussed risks of the procedure included but are not limited to bleeding, infection, allergic reaction, nerve damage, hematoma fomation, abscess formation, failure of the pain to improve and potential worsening of the pain.      Since Mr. Sawatzky has failed at least 6 weeks of conservative measures including over-the-counter medications, prescription medications, physical therapy, and a home exercise program it is reasonable to proceed with the above injection.  The patient verbalized understanding to the potential risks, benefits, and reasonable alternatives to the above injection and wishes to proceed.  His response will help to further determine a treatment plan.    Mr. Winski was recommended to continue conservative treatment including home exercises and stretching and current medications.  Patient understood and verbalized understanding.    Mr. Jent will follow-up in 2-4 weeks after the injections or sooner if warranted.    I am scribing for, and in the presence of, Dr. Marygrace Drought for services provided on 05/05/2022.  Amelia Angotti, SCRIBE    I personally performed the services described in this documentation, as scribed in my presence, and is both accurate and complete.     Marygrace Drought, DO  Interventional Pain Medicine

## 2022-05-07 ENCOUNTER — Encounter (HOSPITAL_BASED_OUTPATIENT_CLINIC_OR_DEPARTMENT_OTHER): Payer: Self-pay | Admitting: Pain Medicine

## 2022-05-27 ENCOUNTER — Telehealth (HOSPITAL_BASED_OUTPATIENT_CLINIC_OR_DEPARTMENT_OTHER): Payer: Self-pay | Admitting: Neurological Surgery

## 2022-05-27 ENCOUNTER — Ambulatory Visit (HOSPITAL_BASED_OUTPATIENT_CLINIC_OR_DEPARTMENT_OTHER): Payer: Self-pay | Admitting: Pain Medicine

## 2022-05-27 NOTE — Telephone Encounter (Signed)
SPOKE TO PATIENT TODAY and we scheduled 06/15/22 for [facility] @ 330PM. Patient is to be there @ 230 PM.  I confirmed patients insurance and verbally went over all instructions with the patient as well as printed them and gave them/mailed them to the patient. We also went over the medications that need held which are MOTRIN for 4 days.  All questions were answered. The patient voiced understanding.   Eustaquio Maize

## 2022-05-27 NOTE — Telephone Encounter (Addendum)
Called patient to discuss the message below, I let patient know that his follow up will have to be 2-4 weeks after his injection with the pain clinic.  Patient asked if there is anyway to have a telephone visit instead of an in person visit. I let patient know I would send message to [facility] and once I get an answer I will give him a call back. Patient agreed to the information. Message sent to Kathrine Cords NP.  Adele Dan Dotson    ----- Message from [place] sent at 05/27/2022  2:04 PM EDT -----  Regarding: Dr. Raymon Mutton: [phone]  Pt called to schedule a follow up for MRI results    Thanks!

## 2022-05-30 ENCOUNTER — Telehealth (HOSPITAL_BASED_OUTPATIENT_CLINIC_OR_DEPARTMENT_OTHER): Payer: Self-pay | Admitting: Neurological Surgery

## 2022-05-30 NOTE — Telephone Encounter (Addendum)
Awaiting an answer from the [place].  Sean Wall    ----- Message from Jamesetta Geralds sent at 05/30/2022  8:41 AM EDT -----  I will have to call the [facility] and ask -  that has never happened before -  I will let you know.    Kim  ----- Message -----  From: Laverna Peace  Sent: 05/30/2022   8:33 AM EDT  To: Jamesetta Geralds    Hey,    I have a question, if we have a [facility] patient are they allowed to do a telephone visit or does it have to be an in person visit?  Tried to schedule this patient for telephone visit and it says that this patient is needing a referral from the [place]?    Thanks,  [employer] Wall

## 2022-05-30 NOTE — Telephone Encounter (Addendum)
Called patient about the message below, I scheduled patient for his telephone visit for 11/14 @230pm$  with Dr.Fergus. Let patient know that it may not be right at 230pm they will get done with the clinic patients first. Patient agreed to the information.  [name]    ----- Message from Jamesetta Geralds sent at 05/30/2022  9:20 AM EDT -----  Per Jinny Blossom at [facility]   - Phone Visit  IS  COVERED   - Sean Wall  ----- Message -----  From: Laverna Peace  Sent: 05/30/2022   8:51 AM EDT  To: Jamesetta Geralds    Its never happened to me either, but thank you.    Sean Wall    ----- Message -----  From: Nilda Calamity D  Sent: 05/30/2022   8:42 AM EDT  To: [name]    I will have to call the [place] [facility] and ask -  that has never happened before -  I will let you know.    Kim  ----- Message -----  From: Laverna Peace  Sent: 05/30/2022   8:33 AM EDT  To: Jamesetta Geralds    Hey,    I have a question, if we have a [facility] patient are they allowed to do a telephone visit or does it have to be an in person visit?  Tried to schedule this patient for telephone visit and it says that this patient is needing a referral from the [place]?    Thanks,  [employer] Wall

## 2022-06-15 ENCOUNTER — Other Ambulatory Visit (HOSPITAL_BASED_OUTPATIENT_CLINIC_OR_DEPARTMENT_OTHER): Payer: Self-pay | Admitting: Pain Medicine

## 2022-06-15 ENCOUNTER — Encounter (HOSPITAL_COMMUNITY): Payer: 59 | Admitting: Pain Medicine

## 2022-06-15 ENCOUNTER — Other Ambulatory Visit: Payer: Self-pay

## 2022-06-15 ENCOUNTER — Encounter (HOSPITAL_COMMUNITY): Payer: Self-pay | Admitting: Pain Medicine

## 2022-06-15 ENCOUNTER — Inpatient Hospital Stay
Admission: RE | Admit: 2022-06-15 | Discharge: 2022-06-15 | Disposition: A | Discharge status: Home / Self Care | Payer: 59 | Source: Ambulatory Visit | Attending: Pain Medicine | Admitting: Pain Medicine

## 2022-06-15 ENCOUNTER — Inpatient Hospital Stay (HOSPITAL_COMMUNITY)
Admission: RE | Admit: 2022-06-15 | Discharge: 2022-06-15 | Disposition: A | Discharge status: Home / Self Care | Payer: 59 | Source: Ambulatory Visit

## 2022-06-15 ENCOUNTER — Encounter (HOSPITAL_COMMUNITY)
Admission: RE | Disposition: A | Discharge status: Home / Self Care | Payer: Self-pay | Source: Ambulatory Visit | Attending: Pain Medicine

## 2022-06-15 DIAGNOSIS — M549 Dorsalgia, unspecified: Secondary | ICD-10-CM

## 2022-06-15 DIAGNOSIS — M4722 Other spondylosis with radiculopathy, cervical region: Secondary | ICD-10-CM

## 2022-06-15 DIAGNOSIS — G8929 Other chronic pain: Secondary | ICD-10-CM

## 2022-06-15 SURGERY — ~~LOC~~ OR MINOR EPIDURAL STEROID INJECTION
Anesthesia: Local (Nurse-Monitored) | Wound class: Clean Wound: Uninfected operative wounds in which no inflammation occurred

## 2022-06-15 MED ORDER — DEXAMETHASONE SODIUM PHOSPHATE 10 MG/ML INJECTION SOLUTION
Freq: Once | INTRAMUSCULAR | Status: DC | PRN
Start: 2022-06-15 — End: 2022-06-15
  Administered 2022-06-15: 20 mg via INTRAMUSCULAR

## 2022-06-15 MED ORDER — IOPAMIDOL 200 MG IODINE/ML (41 %) INTRATHECAL SOLUTION
Freq: Once | INTRATHECAL | Status: DC | PRN
Start: 2022-06-15 — End: 2022-06-15
  Administered 2022-06-15: 3 mL via INTRAMUSCULAR

## 2022-06-15 MED ORDER — SODIUM CHLORIDE 0.9 % INJECTION SOLUTION
Freq: Once | INTRAMUSCULAR | Status: DC | PRN
Start: 2022-06-15 — End: 2022-06-15
  Administered 2022-06-15: 10 mL via INTRAMUSCULAR

## 2022-06-15 MED ORDER — LIDOCAINE HCL 10 MG/ML (1 %) INJECTION SOLUTION
Freq: Once | INTRAMUSCULAR | Status: DC | PRN
Start: 2022-06-15 — End: 2022-06-15
  Administered 2022-06-15: 5 mL via INTRAMUSCULAR

## 2022-06-15 MED ORDER — IOPAMIDOL 200 MG IODINE/ML (41 %) INTRATHECAL SOLUTION
3.0000 mL | INTRATHECAL | Status: AC
Start: 2022-06-15 — End: 2022-06-15
  Administered 2022-06-15: 3 mL via EPIDURAL

## 2022-06-15 SURGICAL SUPPLY — 18 items
APPL 70% ISPRP 2% CHG 10.5ML_CHLRPRP HI-LT ORNG SCRUB TEAL (MED SURG SUPPLIES) ×1
APPL ISPRP CHG 10.5ML CHLRPRP HI-LT ORNG PREP STRL LF (MED SURG SUPPLIES) ×1 IMPLANT
BAG 36X36IN BAND EQP (DRAPE/PACKS/SHEETS/OR TOWEL) IMPLANT
CONV USE 163322 - SYRINGE 20ML LF  STRL LL MED (MED SURG SUPPLIES) IMPLANT
CONV USE ITEM 321827 - GLOVE SURG 7.5 LF PF CLS STRL_BRN TINT 12IN PROTEXIS (GLOVES AND ACCESSORIES) ×1 IMPLANT
DISC NO SUB - TRAY EPIDRL 28X22IN 18GA 27GA LL TUOHY 1.25IN ASST VOL OVAL 10% PVP IOD PLASTIC 1 SHOT NEEDLE STICK (MED SURG SUPPLIES) ×1 IMPLANT
DRAPE BND BG 36X36IN SNAP KOVE_R EQP (DRAPE/PACKS/SHEETS/OR TOWEL)
NEEDLE EPIDRL YW 4.5IN 20GA TUOHY METAL PLASTIC BVL STY REM WNG SLIDE DEPTH INDICATOR STRL LF  DISP (MED SURG SUPPLIES) IMPLANT
NEEDLE EPIDRL YW 4.5IN 20GA TU_OHY METAL PLASTIC BVL STY REM (MED SURG SUPPLIES)
NEEDLE SPINAL 3.5IN 22GA SPNCN QUINCKE BPA PVC FREE DEHP-FR STRL LF (MED SURG SUPPLIES) IMPLANT
NEEDLE SPINAL 3.5IN 22GA SPNCN_QUINCKE BPA PVC FREE DEHP-FR (MED SURG SUPPLIES)
NEEDLE SPINAL BLK 5IN 22GA QUINCKE LONG LGTH REG WL POLYPROP STRL LF  DISP (MED SURG SUPPLIES) IMPLANT
NEEDLE SPINAL BLK 5IN 22GA QUI_NCKE LONG LGTH REG WL POLYPROP (MED SURG SUPPLIES)
NERVE BLOCK TRAY UNIV STRL LF  DISP (MED SURG SUPPLIES)
SYRINGE 20ML LF STRL LL MED (MED SURG SUPPLIES)
TRAY EPIDURAL SNGL SHOT_182289 10EA/CS (MED SURG SUPPLIES) ×1
TRAY UNIV NERVE BLOCK DISP SNG USE LF STRL (MED SURG SUPPLIES) IMPLANT
UNIVERSAL BLOCK TRAY QTY 20 (MED SURG SUPPLIES)

## 2022-06-15 NOTE — Discharge Instructions (Signed)
[facility]  AT  - , [place] [address] - Phone: ([phone])      Complications (Reasons to call):  . Temperature greater than 101 degrees  . Unusual redness or swelling at the injection site  . Persistent nausea or vomiting or headache  . Persistent weakness, numbness or bleeding  . Loss of bowel or bladder control  . If you are unable to reach your physician and your symptoms are severe, have yourself brought to the nearest Emergency Department or call 911    You may experience:  . You may also experience a temporary increase in the level of your pain  . You may experience weakness, tingling or heavy feelings in your legs the first few hours after your procedure, requiring you to be cautious when ambulating (walking).   . Do NOT drive a car or operate heavy machinery for 24 hours after your procedure    Medications:  Most medications held for your procedure may be resumed one day after the procedure. Please ask your physician if you have questions about specific medications or the procedure itself.    Comfort measures:  . You may use ice over the injection site  . AVOID HEAT for the first 24 hours  . After that ice or heat may be used as needed. DO NOT apply LONGER than 20 minutes and wait 20 minutes before reapplication  . Avoid sitting in a bathtub, hot tub, pool, etc. for 3 days after your procedure    Activity:  . Rest at home for the next 24 hours  . Then increase activity as tolerated  . Typically it is ok to return to work one day after the procedure      It is recommended that you do not receive any vaccinations 7 days prior to a pain clinic procedure, or 7 days after a pain clinic procedure. Side effects from the vaccine may be similar to those that you might experience as an adverse reaction to a pain clinic procedure.

## 2022-06-15 NOTE — Nurses Notes (Signed)
[place] Pain Rating Scale     On a scale of 0-10, during the past 24 hours, pain has interfered with you usual activity: 5     On a scale of 0-10, during the past 24 hours, pain has interfered with your sleep: 8    On a scale of 0-10, during the past 24 hours, pain has affected your mood: 8     On a scale of 0-10, during the past 24 hours, pain has contributed to your stress: 8     On a scale of 0-10, what is your overall pain Rating: 6        COVID-19 Admission Screen    Low Risk:  None    Moderate/High Risk: {None    Test Result:Not Indicated

## 2022-06-15 NOTE — Nurses Notes (Signed)
Meds on field, see MD notes for amounts used.

## 2022-06-15 NOTE — OR Surgeon (Signed)
PATIENT NAME:  Sean Wall    MR#:  [id number]    DATE OF BIRTH:  04/11/1956    DATE OF PROCEDURE:  06/15/22    PROCEDURE:  Cervical epidural steroid injection under fluoroscopic guidance at the C7-T1 level with right-sided preference.    PERFORMING PHYSICIAN:  Marygrace Drought, DO    PRE-PROCEDURE DIAGNOSIS:  Cervical radiculopathy, cervical spondylosis, chronic neck pain    POST-PROCEDURE DIAGNOSIS:  Diagnosis unchanged    ANESTHESIA:  Local    ESTIMATED BLOOD LOSS:  Minimal    COMPLICATIONS:  None    CONSENT:  Today's procedure, its potential benefits as well as its risks and potential side effects were reviewed.  Discussed risks of the procedure include bleeding, infection, nerve irritation or damage, reactions medications administered, headache, failure of the pain to improve, and exacerbation of the pain.  These risks were explained to the patient, who verbalized understanding and who wished to proceed.  Informed consent was signed.    DESCRIPTION OF PROCEDURE:  After written informed consent was obtained, the patient was taken to the fluoroscopy suite and placed in the prone position.  Anatomical landmarks were identified by way of fluoroscopy in multiple views.      The skin of the cervical region was prepped using antiseptic and draped in the usual sterile fashion.  Strict aseptic technique was utilized.  The skin and subcutaneous tissues at the needle entry site were infiltrated with 3 mL of 1% preservative-free lidocaine using 25-gauge 1-1/2-inch needle.      A Tuohy needle was then incrementally advanced under fluoroscopy using a loss-of-resistance technique.  Upon entering into the epidural space, a positive loss of resistance to air was noted and a characteristic pop was felt.  Proper placement into the epidural space was confirmed with a hanging column technique where preservative -free normal saline was noted to flow freely to gravity into the epidural space.  There were no parasthesias reported.  After  negative aspiration for CSF or heme, Isovue 200 M was used to delineate epidural spread.  After negative aspiration was reconfirmed, an injectate consisting of 1.6 mL of dexamethasone 10 mg/mL mixed with 2.4 mL of preservative-free normal saline was slowly administered.      The patient tolerated the procedure well and all needles were removed intact.  Hemostasis was maintained.  There were no apparent paresthesias or complications.  The skin was wiped clean, and a Band-Aid was placed as appropriate.  The patient was monitored for an appropriate period of time following the procedure and remained hemodynamically stable and neurovascularly intact.  The patient was ultimately discharged to home with supervision in good condition and instructed to follow up in the office in approximately 14 days or sooner as warranted.     Marygrace Drought, DO  06/15/2022, 22:38

## 2022-06-17 ENCOUNTER — Telehealth (HOSPITAL_BASED_OUTPATIENT_CLINIC_OR_DEPARTMENT_OTHER): Payer: Self-pay | Admitting: Pain Medicine

## 2022-06-17 NOTE — Telephone Encounter (Signed)
Called to see how patient was doing after their Cervical Epidural Steroid injection on 06/15/22  with Dr. Rip Harbour, and what percent of relief they had. No answer, left message to return our call    Murvin Donning, MA

## 2022-06-28 ENCOUNTER — Ambulatory Visit: Payer: 59 | Admitting: Neurological Surgery

## 2022-06-28 ENCOUNTER — Encounter (HOSPITAL_BASED_OUTPATIENT_CLINIC_OR_DEPARTMENT_OTHER): Payer: Self-pay | Admitting: Neurological Surgery

## 2022-06-28 DIAGNOSIS — M5412 Radiculopathy, cervical region: Secondary | ICD-10-CM

## 2022-06-28 DIAGNOSIS — M4726 Other spondylosis with radiculopathy, lumbar region: Secondary | ICD-10-CM | POA: Insufficient documentation

## 2022-06-28 DIAGNOSIS — M4722 Other spondylosis with radiculopathy, cervical region: Secondary | ICD-10-CM | POA: Insufficient documentation

## 2022-06-28 DIAGNOSIS — M47812 Spondylosis without myelopathy or radiculopathy, cervical region: Secondary | ICD-10-CM

## 2022-06-28 DIAGNOSIS — M5116 Intervertebral disc disorders with radiculopathy, lumbar region: Secondary | ICD-10-CM | POA: Insufficient documentation

## 2022-06-28 DIAGNOSIS — M5416 Radiculopathy, lumbar region: Secondary | ICD-10-CM

## 2022-06-28 DIAGNOSIS — Z981 Arthrodesis status: Secondary | ICD-10-CM | POA: Insufficient documentation

## 2022-06-28 NOTE — Progress Notes (Addendum)
Department of Neurosurgery  Outpatient Follow up    Name:  Sean Wall      MR#:    [id number]   DOB:  November 20, 1955     Date/Time: 06/28/2022, 16:04  ______________________________________________________________________    Assessment/Plan:     ICD-10-CM    1. Cervical spondylosis  M47.812       2. History of lumbar fusion  Z98.1       3. Lumbar radiculopathy  M54.16       4. Cervical radiculopathy  M54.12            Last Visit: maging reviewed and physical examination completed.  MRI of the cervical spine does reveal bilateral foraminal stenosis at C5-6.  We will set the patient up for a CESI with our pain clinic.  Today we will obtain flexion and extension x-rays of the lumbar spine to rule out any hypermobility.  We will also obtain an MRI of the lumbar spine with and without contrast to evaluate for any neural element compromise given the patient's complaints of an L3 radiculopathy in the left lower extremity and history of fusion.  The patient will follow up around 2-4 weeks after the injection is completed     Current Visit (telephone):     Patient being evaluated via telehealth to reassess symptoms after undergoing C5-6 CESI and review lumbar MRI. Patient reports mild-moderate relief of symptoms after undergoing CESI. He states he will occasionally experience symptoms extending down his right arm/hand. He states his main concern in his lower back and left anterior leg pain. 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 He has not undergone injectio therapy for lumbar spine. Pertinent negatives include bowel/bladder incontinence, saddle paresthesia, and gait instability. He denies any other questions or concerns at this time.     -- L2-3 LESI ordered. (Addendum: Patient is established with [facility] and insurance requires injections to be scheduled with their office. Cancelled injection from our office and will sent message to  appropriate provider).    -- Patient will RTC 2-3 weeks after injection with Dr. Michiel Sites' to reassess lumbar and cervical symptoms, sooner if needed.     -- Patient understands and agrees to current treatment plan. All questions were answered to the satisfaction of the patient.     Patient evaluated independently via telehealth (audio only).     Bryson Ha, PA-C  [facility] Department of Neurosurgery   06/28/2022 4:04 PM   _______________________________________________________________________    Chief Complaint:  Neck pain, Back pain, Arm pain, and Leg pain    History of Present Illness:  Sean Wall  is a 66 y.o. male who returns to the Neurosurgery Clinc for a follow up appointment.    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    HTN (hypertension)     Hyperlipidemia     Ischemic colitis (CMS HCC)     Sleep apnea     Tinnitus  Past Surgical History:   Procedure Laterality Date    COLONOSCOPY  04/09/2021    ELBOW SURGERY Left     HX APPENDECTOMY      HX CARPAL TUNNEL RELEASE Bilateral     HX LUMBAR FUSION      hardware in place    HX SHOULDER SURGERY Bilateral     5 surgeries on right and 3 on the left    HX TURP      HX VASECTOMY      INGUINAL HERNIA REPAIR Right     LASIK Bilateral             Family History:  Family Medical History:       Problem Relation (Age of Onset)    No Known Problems Mother, Father, Sister, Brother, Half-Sister, Half-Brother, Maternal Aunt, Maternal Uncle, Paternal 54, Paternal Uncle, Maternal Grandmother, Maternal Grandfather, Paternal 76, Paternal 60, Daughter, Son, Other               Allergies:  Allergies   Allergen Reactions    Propofol Rash    Lithium      Blurred vision       Home Medications:  Prior to Admission medications    Medication Sig Start Date End Date Taking? Authorizing Provider   atorvastatin (LIPITOR) 40 mg Oral Tablet Take 1 Tablet (40 mg total) by mouth Every evening   Yes Provider, Historical   azelastine-fluticasone 137-50 mcg/spray Nasal Spray, Non-Aerosol Administer 2 Sprays into affected nostril(s) Every night    Provider, Historical   bisacodyL (DULCOLAX) 5 mg Oral Tablet, Delayed Release (E.C.) Take 2 Tablets (10 mg total) by mouth Every 24 hours as needed for Constipation for up to 1 dose To be taken in the evening 02/07/22  Yes Brooks Sailors, MD   buPROPion (WELLBUTRIN XL) 300 mg extended release 24 hr tablet Take 1 Tablet (300 mg total) by mouth Once a day   Yes Provider, Historical   cetirizine (ZYRTEC) 10 mg Oral Tablet Take 1 Tablet (10 mg total) by mouth Once a day   Yes Provider, Historical   cyanocobalamin (VITAMIN B12) 1,000 mcg/mL Injection Solution 1 mL (1,000 mcg total) Every 30 days   Yes Provider, Historical   cyclobenzaprine (FLEXERIL) 10 mg Oral Tablet Take 1 Tablet (10 mg total) by mouth Three times a day as needed   Yes Provider, Historical   dextroamphetamine-amphetamine (ADDERALL) 10 mg Oral Tablet Take 1 Tablet (10 mg total) by mouth Once a day   Yes Provider, Historical   ferrous sulfate (FERATAB) 324 mg (65 mg iron) Oral Tablet, Delayed Release (E.C.) Take 1 Tablet (324 mg total) by mouth Every other day On Monday, Wednesday, friday  Patient not taking: Reported on 06/15/2022    Provider, Historical   finasteride (PROSCAR) 5 mg Oral Tablet Take 1 Tablet (5 mg total) by mouth Once a day   Yes Provider, Historical   gabapentin (NEURONTIN) 300 mg Oral Capsule Take 1 Capsule (300 mg total) by mouth Three times a day   Yes Provider, Historical   HERBAL DRUGS ORAL Take 700 mg by mouth Three times a day PEPPERMINT LEAF   Yes Provider,  Historical   HYDROcodone-acetaminophen (NORCO) 5-325 mg Oral Tablet Take 1 Tablet by mouth Every 8 hours as needed for Pain   Yes Provider, Historical   HYDROcodone-acetaminophen (NORCO) 5-325 mg Oral Tablet Take 1-2 Tablets by mouth Every 6 hours as needed for Pain 03/28/22  Yes Karie Kirks, MD   Ibuprofen (  MOTRIN) 600 mg Oral Tablet Take 1 Tablet (600 mg total) by mouth Four times a day as needed for Pain  Patient not taking: Reported on 06/15/2022    Provider, Historical   lactobacillus comb no.10 (PROBIOTIC) 20 billion cell Oral Capsule Take 1 Capsule by mouth Once a day   Yes Provider, Historical   melatonin 3 mg Oral Capsule Take 2 Capsules (6 mg total) by mouth Every night   Yes Provider, Historical   metoprolol tartrate (LOPRESSOR) 25 mg Oral Tablet Take 1 Tablet (25 mg total) by mouth Once a day   Yes Provider, Historical   pantoprazole (PROTONIX) 20 mg Oral Tablet, Delayed Release (E.C.) Take 1 Tablet (20 mg total) by mouth Every morning before breakfast   Yes Provider, Historical   predniSONE (DELTASONE) 10 mg Oral Tablet 4 TABLETS QAM X 3 DAYS, THEN 3 TABLETS QAM X 3 DAYS, THEN 2 TABLETS QAM X 3 DAYS, THEN 1 TABLET QAM X 3 DAYS  Patient not taking: Reported on 05/05/2022 03/28/22   Karie Kirks, MD   QUEtiapine (SEROQUEL) 50 mg Oral Tablet Take 1 Tablet (50 mg total) by mouth Every night   Yes Provider, Historical   simethicone (MYLICON) 80 mg Oral Tablet, Chewable Chew 1 Tablet (80 mg total) Every 6 hours as needed  Patient not taking: Reported on 06/28/2022    Provider, Historical   sumatriptan succinate (IMITREX) 100 mg Oral Tablet Take 1 Tablet (100 mg total) by mouth Once, as needed for Migraine May repeat in 2 hours in needed   Yes Provider, Historical   tadalafil (CIALIS) 10 mg Oral Tablet Take 1 Tablet (10 mg total) by mouth Every 24 hours as needed  Patient taking differently: Take 0.5 Tablets (5 mg total) by mouth Every 24 hours as needed 02/09/22 02/09/23 Yes Dice, Lurena Joiner, PA-C    tadalafil (CIALIS) 20 mg Oral Tablet Take 1 Tablet (20 mg total) by mouth Every 24 hours as needed 03/10/22  Yes Williemae Natter, MD   tamsulosin (FLOMAX) 0.4 mg Oral Capsule Take 1 Capsule (0.4 mg total) by mouth Every evening after dinner   Yes Provider, Historical   Testosterone 1 % (25 mg/2.5gram) Transdermal Gel in Packet Place 2 Each on the skin Once a day 2 pumps each shoulder daily am   Yes Provider, Historical       Social History:   Social History     Tobacco Use    Smoking status: Light Smoker     Types: Cigars    Smokeless tobacco: Never    Tobacco comments:     OCCASIONAL SMOKING-1 CIGAR EVERY 2 WEEKS   Vaping Use    Vaping Use: Never used   Substance Use Topics    Alcohol use: Yes     Alcohol/week: 21.0 standard drinks of alcohol     Types: 21 Cans of beer per week     Comment: 2 drinks daily    Drug use: Never        Review of Systems:  Constitutional: no weight loss, fever, night sweats  GI: No change in bowel habits, No Nausea, vomitting, diarrhea, or constipation  GU: Negative for dysuria, frequency and incontinence  Neuro: negative    Physical Exam:  There were no vitals taken for this visit.    PE omitted due to Telehealth visit  Imaging:  I reviewed the study(ies) myself and made my own interpretation.  My interpretation can be found in the HPI and/or Assessment.  The  following is the radiology report:    MRI SPINE LUMBOSACRAL W/WO CONTRAST     ECHNIQUE: Multiplanar multi sequence MR imaging lumbar spine is performed with and without the use of intravenous contrast.     There are postoperative changes of L3 and L4 laminectomies with posterior stabilization from L3 through L5 causing transpedicular screws, spinal rods, and intervertebral disc prosthesis. Spinal hardware does result in magnetic sensibility artifact limiting the exam. Alignment is near-anatomic with slight straightening of the lumbar lordosis. Type I and type II degenerative endplate changes are seen with mild endplate  invagination at L3-4 and L4-5. There is no fracture.     At the L1-2 level, there is narrowing and desiccation of intervertebral disc. There is generalized disc bulging and early facet joint arthrosis. This results in moderate right and mild left neural foraminal encroachment. There is effacement of the thecal sac with no significant spinal stenosis.     At the L2-3 level, there is narrowing and desiccation of intervertebral disc. There is a large generalized disc bulge with prominent posterior osteophyte formation. This combines with facet joint arthrosis and prominent ligamentum flavum to cause moderate right greater than left neural foraminal encroachment and moderate spinal canal stenosis. There is suspected impingement on the left L2 nerve root laterally.     At the L3-4 and L4-5 levels, the neural foramen appear patent bilaterally. There is good decompression of the thecal sac with no significant spinal stenosis.     At the L5-S1 level, there is mild disc bulging and posterior osteophyte formation. There is prominent facet joint arthrosis. This results in a moderate left and mild right neural foraminal encroachment. Of note, the thecal sac is extremely small at this level     IMPRESSION:  1. Postoperative changes of posterior stabilization from L3 through L5 as described above. Alignment is near-anatomic with slight straightening of the lumbar lordosis.  2. 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  3. The findings are most pronounced at L2-3 where there is moderate spinal canal stenosis and bilateral neural foraminal encroachment secondary to disc bulging, osteophyte formation, facet joint arthrosis, and prominent ligamentum flavum. There is suspected impingement on the left L2 nerve root laterally at this level.      The patient/family initiated a request for telephone service.  Verbal consent for this service was obtained from the patient/family.    Last office  visit in this department: 04/20/2022      Reason for call: MRI and injection follow up.   Call notes:    Moderate relief from injection.     MRI reviewed with patient - will proceed with LESI.    Patient will RTC 2-3 weeks after injection is completed.       ICD-10-CM    1. Cervical spondylosis  M47.812       2. History of lumbar fusion  Z98.1       3. Lumbar radiculopathy  M54.16       4. Cervical radiculopathy  M54.12           Total provider time spent with the patient on the phone: 10 minutes.    Bryson Ha, PA-C

## 2022-06-29 NOTE — Addendum Note (Signed)
Addended by: Elisha Ponder on: 06/29/2022 09:20 AM     Modules accepted: Orders

## 2022-07-19 ENCOUNTER — Ambulatory Visit (HOSPITAL_BASED_OUTPATIENT_CLINIC_OR_DEPARTMENT_OTHER): Payer: Self-pay | Admitting: Family

## 2022-08-16 ENCOUNTER — Telehealth (HOSPITAL_BASED_OUTPATIENT_CLINIC_OR_DEPARTMENT_OTHER): Payer: Self-pay | Admitting: Family

## 2022-08-16 NOTE — Telephone Encounter (Signed)
Called pt, pt is inquiring about an injection he thought was to be placed 06/28/22 by neurosurgery. I let him we were not informed and that we would need to see him in clinic for a follow up before Jinny Blossom can place another injection order. Got him scheduled, pt agreed to the above.  Blenda Mounts

## 2022-08-16 NOTE — Telephone Encounter (Signed)
-----   Message from Gaynell Face sent at 08/16/2022  3:30 PM EST -----  Regarding: Illene Regulus  Contact: [phone]  Pt. Wants to reschedule missed appt. That was on Dec. 5. Please call [phone]    Thank you,    Marliss Coots

## 2022-08-17 ENCOUNTER — Encounter (HOSPITAL_BASED_OUTPATIENT_CLINIC_OR_DEPARTMENT_OTHER): Payer: Self-pay

## 2022-08-18 ENCOUNTER — Ambulatory Visit: Payer: Commercial Managed Care - PPO | Attending: Pain Medicine | Admitting: Pain Medicine

## 2022-08-18 ENCOUNTER — Encounter (HOSPITAL_BASED_OUTPATIENT_CLINIC_OR_DEPARTMENT_OTHER): Payer: Self-pay | Admitting: Pain Medicine

## 2022-08-18 ENCOUNTER — Other Ambulatory Visit: Payer: Self-pay

## 2022-08-18 VITALS — BP 124/78 | HR 76 | Temp 97.6°F | Resp 16 | Ht 68.0 in | Wt 219.1 lb

## 2022-08-18 DIAGNOSIS — M47816 Spondylosis without myelopathy or radiculopathy, lumbar region: Secondary | ICD-10-CM | POA: Insufficient documentation

## 2022-08-18 DIAGNOSIS — M5020 Other cervical disc displacement, unspecified cervical region: Secondary | ICD-10-CM | POA: Insufficient documentation

## 2022-08-18 DIAGNOSIS — M47812 Spondylosis without myelopathy or radiculopathy, cervical region: Secondary | ICD-10-CM | POA: Insufficient documentation

## 2022-08-18 DIAGNOSIS — M542 Cervicalgia: Secondary | ICD-10-CM | POA: Insufficient documentation

## 2022-08-18 DIAGNOSIS — G8929 Other chronic pain: Secondary | ICD-10-CM | POA: Insufficient documentation

## 2022-08-18 DIAGNOSIS — M4802 Spinal stenosis, cervical region: Secondary | ICD-10-CM | POA: Insufficient documentation

## 2022-08-18 DIAGNOSIS — M4726 Other spondylosis with radiculopathy, lumbar region: Secondary | ICD-10-CM

## 2022-08-18 DIAGNOSIS — M5010 Cervical disc disorder with radiculopathy, unspecified cervical region: Secondary | ICD-10-CM

## 2022-08-18 DIAGNOSIS — M4722 Other spondylosis with radiculopathy, cervical region: Secondary | ICD-10-CM

## 2022-08-18 DIAGNOSIS — Z981 Arthrodesis status: Secondary | ICD-10-CM | POA: Insufficient documentation

## 2022-08-18 DIAGNOSIS — M5412 Radiculopathy, cervical region: Secondary | ICD-10-CM | POA: Insufficient documentation

## 2022-08-18 DIAGNOSIS — M5416 Radiculopathy, lumbar region: Secondary | ICD-10-CM | POA: Insufficient documentation

## 2022-08-18 NOTE — Progress Notes (Signed)
[facility]  Pain Management, [facility]  [place] [address]  [phone]    PATIENT NAME:  Sean Wall                                             MEDICAL RECORD NUMBER:  [id number]  DATE OF BIRTH:  09/17/1955      PRIMARY CARE PHYSICIAN:  [name], MD  REFERRING PHYSICIAN:  No ref. provider found  DATE OF VISIT:  August 18, 2022                                                                                          FOLLOW UP OFFICE VISIT NOTE:    REASON FOR VISIT  Follow up: After injection therapy     HPI:  Patient is a 67 y.o.  male who presents for follow up visit today after undergoing  C7/T1 CESI with right sided preference on 06/15/2022. Patient reports 50% pain relief for 2 months. Overall he is pleased with the results of injection therapy. Patient reports his low back pain is more bothersome and he would like to focus on that first.    Today, the pain is rated at a 9/10, and is located in the Low Back with radiation of pain to the Bilateral legs (L>R). He also reports neck pain with radiation into the right arm.   - It is described as Numb and Tingling.  - It is exacerbated with walking and alleviated with rest.   -           Frequency of the pain is Daily with Fluctuations  - There is associated lower extremity numbness and tingling  - The patient denies ongoing fever/chills, night sweats, unintended weight loss and bowel or bladder incontinence.  -           Medications tried include  Gabapenin, Hydrocodone, oral steroids, and Flexeril     Of note, patient comes to clinic today alone.     Treatments tried:  As indicated in the past documentation, patient has tried conservative measures including medications, physical therapy/home exercises, and heat/ice     Injections:   2018 L3-L5 lumbar fusion in [place] by Dr. Vertell Limber.   06/15/22 - C7/T1 CESI with right sided preference with 50% pain relief x 2 months.     Blood thinners:  none  Prescribed by:  N/A    Have you had therapy for the condition you are being seen for today?: No  Have you tried an anti-inflammatory for at least 3 weeks and failed? : No  Are your symptoms worsening?: Yes  Are your symptoms worsening?: Yes  Do you have weakness in your arms/legs? : Yes  Do you have numbness/tingling in your arms/legs? 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    HTN (hypertension)     Hyperlipidemia     Ischemic colitis (CMS HCC)     Sleep apnea     Tinnitus            Patient Active Problem List   Diagnosis    Colitis    Hematochezia    Hypertension    GERD (gastroesophageal reflux disease)    BPH (benign prostatic hyperplasia)    Family history of migraine headaches    Depression    Tobacco use disorder    Ischemic colitis (CMS HCC)    Irritable bowel syndrome with diarrhea    History of colon polyps    Abdominal pain, unspecified abdominal location       Family Medical History:       Problem Relation (Age of Onset)    No Known Problems Mother, Father, Sister, Brother, Half-Sister, Half-Brother, Maternal Aunt, Maternal Uncle, Paternal 37, Paternal Uncle, Maternal Grandmother, Maternal Grandfather, Paternal Grandmother, Paternal Grandfather, Daughter, Son, Other              Medications:  atorvastatin (LIPITOR) 40 mg Oral Tablet, Take 1 Tablet (40 mg total) by mouth Every evening  azelastine-fluticasone 137-50 mcg/spray Nasal Spray, Non-Aerosol, Administer 2 Sprays into affected nostril(s) Every night  bisacodyL (DULCOLAX) 5 mg Oral Tablet, Delayed Release  (E.C.), Take 2 Tablets (10 mg total) by mouth Every 24 hours as needed for Constipation for up to 1 dose To be taken in the evening  buPROPion (WELLBUTRIN XL) 300 mg extended release 24 hr tablet, Take 1 Tablet (300 mg total) by mouth Once a day  cetirizine (ZYRTEC) 10 mg Oral Tablet, Take 1 Tablet (10 mg total) by mouth Once a day  cyanocobalamin (VITAMIN B12) 1,000 mcg/mL Injection Solution, 1 mL (1,000 mcg total) Every 30 days  cyclobenzaprine (FLEXERIL) 10 mg Oral Tablet, Take 1 Tablet (10 mg total) by mouth Three times a day as needed  dextroamphetamine-amphetamine (ADDERALL) 10 mg Oral Tablet, Take 1 Tablet (10 mg total) by mouth Once a day  ferrous sulfate (FERATAB) 324 mg (65 mg iron) Oral Tablet, Delayed Release (E.C.), Take 1 Tablet (324 mg total) by mouth Every other day On Monday, Wednesday, friday  finasteride (PROSCAR) 5 mg Oral Tablet, Take 1 Tablet (5 mg total) by mouth Once a day  gabapentin (NEURONTIN) 300 mg Oral Capsule, Take 1 Capsule (300 mg total) by mouth Three times a day  HERBAL DRUGS ORAL, Take 700 mg by mouth Three times a day PEPPERMINT LEAF  HYDROcodone-acetaminophen (NORCO) 5-325 mg Oral Tablet, Take 1 Tablet by mouth Every 8 hours as needed for Pain  HYDROcodone-acetaminophen (NORCO) 5-325 mg Oral Tablet, Take 1-2 Tablets by mouth Every 6 hours as needed for Pain  Ibuprofen (MOTRIN) 600 mg Oral Tablet, Take 1 Tablet (600 mg total) by mouth Four times a day as needed for Pain  lactobacillus comb no.10 (PROBIOTIC) 20 billion cell Oral Capsule, Take 1 Capsule by mouth Once a day  melatonin 3 mg Oral Capsule, Take 2 Capsules (6 mg total) by mouth Every night  metoprolol tartrate (LOPRESSOR) 25 mg Oral Tablet, Take 1 Tablet (25 mg  total) by mouth Once a day  pantoprazole (PROTONIX) 20 mg Oral Tablet, Delayed Release (E.C.), Take 1 Tablet (20 mg total) by mouth Every morning before breakfast  predniSONE (DELTASONE) 10 mg Oral Tablet, 4 TABLETS QAM X 3 DAYS, THEN 3 TABLETS QAM X 3 DAYS, THEN  2 TABLETS QAM X 3 DAYS, THEN 1 TABLET QAM X 3 DAYS  QUEtiapine (SEROQUEL) 50 mg Oral Tablet, Take 1 Tablet (50 mg total) by mouth Every night  simethicone (MYLICON) 80 mg Oral Tablet, Chewable, Chew 1 Tablet (80 mg total) Every 6 hours as needed  sumatriptan succinate (IMITREX) 100 mg Oral Tablet, Take 1 Tablet (100 mg total) by mouth Once, as needed for Migraine May repeat in 2 hours in needed  tadalafil (CIALIS) 10 mg Oral Tablet, Take 1 Tablet (10 mg total) by mouth Every 24 hours as needed (Patient taking differently: Take 0.5 Tablets (5 mg total) by mouth Every 24 hours as needed)  tadalafil (CIALIS) 20 mg Oral Tablet, Take 1 Tablet (20 mg total) by mouth Every 24 hours as needed  tamsulosin (FLOMAX) 0.4 mg Oral Capsule, Take 1 Capsule (0.4 mg total) by mouth Every evening after dinner  Testosterone 1 % (25 mg/2.5gram) Transdermal Gel in Packet, Place 2 Each on the skin Once a day 2 pumps each shoulder daily am    No facility-administered medications prior to visit.      Medication Allergies:  Propofol and Lithium      Social History     Socioeconomic History    Marital status: Married     Spouse name: Not on file    Number of children: Not on file    Years of education: Not on file    Highest education level: Not on file   Occupational History    Not on file   Tobacco Use    Smoking status: Light Smoker     Types: Cigars    Smokeless tobacco: Never    Tobacco comments:     OCCASIONAL SMOKING-1 CIGAR EVERY 2 WEEKS   Vaping Use    Vaping Use: Never used   Substance and Sexual Activity    Alcohol use: Yes     Alcohol/week: 21.0 standard drinks of alcohol     Types: 21 Cans of beer per week     Comment: 2 drinks daily    Drug use: Never    Sexual activity: Not on file   Other Topics Concern    Ability to Walk 1 Flight of Steps without SOB/CP Yes    Routine Exercise Not Asked    Ability to Walk 2 Flight of Steps without SOB/CP Not Asked    Unable to Ambulate Not Asked    Total Care Not Asked    Ability To Do Own  ADL's Yes    Uses Walker Not Asked    Other Activity Level Not Asked    Uses Cane Not Asked   Social History Narrative    Not on file     Social Determinants of Health     Financial Resource Strain: Not on file   Transportation Needs: Not on file   Social Connections: Not on file   Intimate Partner Violence: Not on file   Housing Stability: Not on file         REVIEW OF SYSTEMS:   Review of Systems -   Positive for eyeglasses, diverticulosis, arthritis, problems with sexual intercourse, scoliosis, depression, neck pain, and low back pain.  EXAM:    Vitals:    08/18/22 0815   BP: 124/78   Pulse: 76   Resp: 16   Temp: 36.4 C (97.6 F)   SpO2: 98%   Weight: 99.4 kg (219 lb 2.2 oz)   Height: 1.727 m ('5\' 8"'$ )   BMI: 33.39            Physical Exam     GENERAL:  The patient is a 67 y.o. male resting comfortably in a seated position in no apparent distress.  The patient demonstrates no pain behavior, symptom magnification or drug seeking behavior.  PSYCHIATRIC:  Speech is fluent.  Affect is appropriate.  The patient demonstrates no signs of hypersomnolence, sedation or confusion.  HEENT:  Normocephalic.  Atraumatic.  Pupils are appropriate size for room lighting.  Extraocular muscles intact.  Neck supple.  CARDIOVASCULAR:  Regular rate and rhythm.  Bilateral radial pulses intact.  RESPIRATORY:  Unlabored respirations.  No accessory muscle use noted.   ABDOMEN:  Soft.  Non-tender.  Non-distended.  UPPER EXTREMITY NEUROLOGIC:  Muscle strength is 5/5 on the right and 5/5 on the left in all major proximal and distal muscle groups of the upper extremities.  Biceps, triceps and brachioradialis reflexes are grossly intact bilaterally.  Sensation is grossly intact in all dermatomes of the upper extremities.  LOWER EXTREMITY NEUROLOGIC:  Muscle strength is 5/5 on the right and 5/5 on the left in all major proximal and distal muscle groups of the lower extremities.  Achilles and patellar reflexes are grossly intact bilaterally.   Sensation is grossly intact in all dermatomes of the lower extremities.  MUSCULOSKELETAL:  The patient is able to rise from a seated to a standing position without significant difficulty and delay.  Gait is antalgic.     Provocative Maneuvers   Lumbar Tenderness: negative  Cervical Spurling Test: positive on right   SLR: negative on  both  Facet Loading: Lumbar positive and Cervical negative   SI joint-  SI joint compression: Pain: no.    SI joint distraction:no.    GT Bursa palpation: no.    Lab A1C Results:  No results found for: "HA1C"    POCT A1C Results:       No data to display                No results found for: "POCTHA1C"  No results found for: "HA1CPOC"       Imaging:  Recent Results (from the past [id number] hour(s))   MRI SPINE LUMBOSACRAL W/WO CONTRAST    Collection Time: 05/04/22  1:07 PM    Narrative    History: Low back pain.    TECHNIQUE: Multiplanar multi sequence MR imaging lumbar spine is performed with and without the use of intravenous contrast.    There are postoperative changes of L3 and L4 laminectomies with posterior stabilization from L3 through L5 causing transpedicular screws, spinal rods, and intervertebral disc prosthesis. Spinal hardware does result in magnetic sensibility artifact limiting the exam. Alignment is near-anatomic with slight straightening of the lumbar lordosis. Type I and type II degenerative endplate changes are seen with mild endplate invagination at L3-4 and L4-5. There is no fracture.    At the L1-2 level, there is narrowing and desiccation of intervertebral disc. There is generalized disc bulging and early facet joint arthrosis. This results in moderate right and mild left neural foraminal encroachment. There is effacement of the thecal sac with no significant spinal stenosis.    At the  L2-3 level, there is narrowing and desiccation of intervertebral disc. There is a large generalized disc bulge with prominent posterior osteophyte formation. This combines with facet  joint arthrosis and prominent ligamentum flavum to cause moderate right greater than left neural foraminal encroachment and moderate spinal canal stenosis. There is suspected impingement on the left L2 nerve root laterally.    At the L3-4 and L4-5 levels, the neural foramen appear patent bilaterally. There is good decompression of the thecal sac with no significant spinal stenosis.    At the L5-S1 level, there is mild disc bulging and posterior osteophyte formation. There is prominent facet joint arthrosis. This results in a moderate left and mild right neural foraminal encroachment. Of note, the thecal sac is extremely small at this level      Impression    1. Postoperative changes of posterior stabilization from L3 through L5 as described above. Alignment is near-anatomic with slight straightening of the lumbar lordosis.  2. 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  3. The findings are most pronounced at L2-3 where there is moderate spinal canal stenosis and bilateral neural foraminal encroachment secondary to disc bulging, osteophyte formation, facet joint arthrosis, and prominent ligamentum flavum. There is suspected impingement on the left L2 nerve root laterally at this level.      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      FINDINGS: There is normal alignment of cervical spine. No fracture or dislocation is seen, with vertebral body heights preserved. Bone marrow signal is within normal limits. Focal fat density of the C2  vertebral body is likely a small hemangioma.    At C2-C3 there is no spinal canal or neural foraminal stenosis.    At C3-C4 facet arthropathy results in mild bilateral neural foraminal narrowing. No spinal canal narrowing is seen.    At C4-C5 uncovertebral and facet arthropathy results in moderate left and mild right neural foraminal narrowing. A central left paracentral disc herniation results in mild spinal canal narrowing.    At C5-C6 there is central and paracentral zone disc herniation resulting in mild to moderate spinal canal narrowing. There is moderate bilateral neural foraminal narrowing due to uncovertebral and facet osteoarthropathy.    At C6-C7 there is moderate bilateral neural foraminal narrowing. No spinal canal narrowing is seen.    At C7-T1 there is no spinal canal narrowing. Mild bilateral neural foraminal narrowing is seen.    No abnormal signal is seen in the spinal cord. The soft tissues of the neck are unremarkable.      Impression    Mild to moderate degenerative changes of the cervical spine, with mild to moderate spinal canal narrowing at C5-C6 as well as moderate neural foraminal narrowing on the left at C4-C5 and bilaterally at C5-C6 and C6-C7.            Radiologist location ID: [id number]           IMPRESSION:    Om was seen today for follow up after  injection  and low back pain.    Diagnoses and all orders for this visit:    Lumbar radiculopathy  -     EPIDURAL STEROID INJECTION - LUMBAR; Future    Lumbar spondylosis  -     EPIDURAL STEROID INJECTION - LUMBAR; Future    History of lumbar fusion  -     EPIDURAL STEROID INJECTION - LUMBAR; Future    Cervical radiculopathy  -     EPIDURAL STEROID INJECTION - CERVICAL; Future    Cervical spondylosis  -     EPIDURAL STEROID INJECTION - CERVICAL; Future    Cervical disc herniation  -     EPIDURAL STEROID INJECTION - CERVICAL; Future    Cervical spinal stenosis  -     EPIDURAL STEROID INJECTION - CERVICAL; Future    Chronic neck pain  -      EPIDURAL STEROID INJECTION - CERVICAL; Future          MEDICAL DECISION MAKING AND PLAN    Patient is a 67 y.o.  male who was seen  today for follow up after undergoing  C7/T1 CESI with right sided preference with good pain relief lasting 2 months . Today the pain is located in low back with radiation into bilateral legs (L>R). He also reports neck pain with radiation into the right arm.     Given the patient's symptoms, examination findings, and imaging results noted above, I discussed the utility of proceeding with a  C7/T1 interlaminar epidural steroid injection under fluoroscopic guidance with right sided preference and left L2/L3 interlaminar epidural steroid injection under fluoroscopic guidance. Using an anatomical model, the procedure, as well as its potential risks, benefits, and reasonable alternatives were discussed in detail.  Discussed risks of the procedure included but are not limited to bleeding, infection, allergic reaction, nerve damage, hematoma fomation, abscess formation, failure of the pain to improve and potential worsening of the pain.  CESI is to be scheduled 1 month after the LESI.     Since Mr. Hornaday has failed at least 6 weeks of conservative measures including over-the-counter medications, prescription medications, physical therapy, and a home exercise program it is reasonable to proceed with the above injection.  The patient verbalized understanding to the potential risks, benefits, and reasonable alternatives to the above injection and wishes to proceed.  His response will help to further determine a treatment plan.    Mr. Kuechle was recommended to continue conservative treatment including home exercises and stretching and current medications.  Patient understood and verbalized understanding.    Mr. Herbers will follow-up in 2-4 weeks after the injections or sooner if warranted.    I am scribing for, and in the presence of, Dr. Marygrace Drought for services provided on 08/18/2022.  Amelia  Angotti, SCRIBE    I personally performed the services described in this documentation, as scribed in my presence, and is both accurate and complete.     Marygrace Drought, DO  Interventional Pain Medicine

## 2022-08-18 NOTE — Nursing Note (Signed)
Pain and Function:     [place] Pain Rating Scale     On a scale of 0-10, during the past 24 hours, pain has interfered with you usual activity:   8    On a scale of 0-10, during the past 24 hours, pain has interfered with your sleep:   7    On a scale of 0-10, during the past 24 hours, pain has affected your mood:   10    On a scale of 0-10, during the past 24 hours, pain has contributed to your stress:   10    On a scale of 0-10, what is your overall pain Rating:   9         Pain Same  Last seen on 06/15/22 for procedure RIGHT CESI   40 % of relief for 2 MONTHS   Lowest level of pain  4/10  Highest level of pain 8 /10  Daisey Must, MA

## 2022-08-19 ENCOUNTER — Ambulatory Visit (HOSPITAL_BASED_OUTPATIENT_CLINIC_OR_DEPARTMENT_OTHER): Payer: Self-pay | Admitting: Student in an Organized Health Care Education/Training Program

## 2022-08-19 NOTE — Telephone Encounter (Signed)
LEFT MESSAGE TO CALL AND SCHEDULE INJECTIONS Sean Wall

## 2022-08-26 ENCOUNTER — Ambulatory Visit (HOSPITAL_BASED_OUTPATIENT_CLINIC_OR_DEPARTMENT_OTHER): Payer: Self-pay | Admitting: Pain Medicine

## 2022-08-26 NOTE — Telephone Encounter (Signed)
LEFT MESSAGE TO CALL AND SCHEDULE INJECTIONS Victoria G Sipes

## 2022-08-29 ENCOUNTER — Ambulatory Visit (HOSPITAL_BASED_OUTPATIENT_CLINIC_OR_DEPARTMENT_OTHER): Payer: Self-pay | Admitting: Pain Medicine

## 2022-08-29 ENCOUNTER — Encounter (HOSPITAL_BASED_OUTPATIENT_CLINIC_OR_DEPARTMENT_OTHER): Payer: Self-pay | Admitting: Pain Medicine

## 2022-08-29 ENCOUNTER — Ambulatory Visit (INDEPENDENT_AMBULATORY_CARE_PROVIDER_SITE_OTHER): Payer: 59 | Admitting: PHYSICIAN ASSISTANT, SURGICAL

## 2022-08-29 ENCOUNTER — Encounter (INDEPENDENT_AMBULATORY_CARE_PROVIDER_SITE_OTHER): Payer: Self-pay | Admitting: PHYSICIAN ASSISTANT, SURGICAL

## 2022-08-29 ENCOUNTER — Other Ambulatory Visit: Payer: Self-pay

## 2022-08-29 VITALS — BP 124/78 | Ht 68.0 in | Wt 219.0 lb

## 2022-08-29 DIAGNOSIS — M160 Bilateral primary osteoarthritis of hip: Secondary | ICD-10-CM

## 2022-08-29 NOTE — Progress Notes (Signed)
[facility]    Progress Note    Name: Sean Wall MRN:  [id number]   Date: 08/29/2022 Age: 67 y.o.       Chief Complaint: Hip Pain (Bilateral hip pain)    History of Present Illness   Babacar Haycraft Flythe is a 67 y.o. male who presents with bilateral hip pain, right worse than left.  Patient unable to quantify duration of pain.  Pain is localized to the anterior groin.  He reports minimal pain to his left side.  Pain worsening with ambulation and weight-bearing.  He has noticed his leg has started to externally rotate.  No history of therapy or injections for the hips. Patient does follow with pain management.  History of L3-L5 lumbar fusion in [place] in 2018.  History of C7/T1 CESI on 06/15/2022.  Pain management has plans on lumbar corticosteroid injections to L2/L3.  He also complains of radicular pain to bilateral lower extremities.  Taking OTC ibuprofen Tylenol for pain relief.  He had x-rays of the bilateral hips done by the [facility] showing arthritis of bilateral hips and was sent to Orthopedics for possible corticosteroid injection.  Ambulates without assistive device.    Medical History     Current Outpatient Medications   Medication Sig    atorvastatin (LIPITOR) 40 mg Oral Tablet Take 1 Tablet (40 mg total) by mouth Every evening    azelastine-fluticasone 137-50 mcg/spray Nasal Spray, Non-Aerosol Administer 2 Sprays into affected nostril(s) Every night    bisacodyL (DULCOLAX) 5 mg Oral Tablet, Delayed Release (E.C.) Take 2 Tablets (10 mg total) by mouth Every 24 hours as needed for Constipation for up to 1 dose To be taken in the evening    buPROPion (WELLBUTRIN XL) 300 mg extended release 24 hr tablet Take 1 Tablet (300 mg total) by mouth Once a day    cetirizine (ZYRTEC) 10 mg Oral Tablet Take 1 Tablet (10 mg total) by mouth Once a day    cyanocobalamin (VITAMIN B12) 1,000 mcg/mL Injection Solution 1 mL (1,000 mcg total) Every  30 days    cyclobenzaprine (FLEXERIL) 10 mg Oral Tablet Take 1 Tablet (10 mg total) by mouth Three times a day as needed    dextroamphetamine-amphetamine (ADDERALL) 10 mg Oral Tablet Take 1 Tablet (10 mg total) by mouth Once a day    ferrous sulfate (FERATAB) 324 mg (65 mg iron) Oral Tablet, Delayed Release (E.C.) Take 1 Tablet (324 mg total) by mouth Every other day On Monday, Wednesday, friday    finasteride (PROSCAR) 5 mg Oral Tablet Take 1 Tablet (5 mg total) by mouth Once a day    gabapentin (NEURONTIN) 300 mg Oral Capsule Take 1 Capsule (300 mg total) by mouth Three times a day    HERBAL DRUGS ORAL Take 700 mg by mouth Three times a day PEPPERMINT LEAF    HYDROcodone-acetaminophen (NORCO) 5-325 mg Oral Tablet Take 1 Tablet by mouth Every 8 hours as needed for Pain    HYDROcodone-acetaminophen (NORCO) 5-325 mg Oral Tablet Take 1-2 Tablets by mouth Every 6 hours as needed for Pain    Ibuprofen (MOTRIN) 600 mg Oral Tablet Take 1 Tablet (600 mg total) by mouth Four times a day as needed for Pain    lactobacillus comb no.10 (PROBIOTIC) 20 billion cell Oral Capsule Take 1 Capsule by mouth Once a day    melatonin 3 mg Oral Capsule Take 2 Capsules (6 mg total) by mouth Every night  metoprolol tartrate (LOPRESSOR) 25 mg Oral Tablet Take 1 Tablet (25 mg total) by mouth Once a day    pantoprazole (PROTONIX) 20 mg Oral Tablet, Delayed Release (E.C.) Take 1 Tablet (20 mg total) by mouth Every morning before breakfast    predniSONE (DELTASONE) 10 mg Oral Tablet 4 TABLETS QAM X 3 DAYS, THEN 3 TABLETS QAM X 3 DAYS, THEN 2 TABLETS QAM X 3 DAYS, THEN 1 TABLET QAM X 3 DAYS    QUEtiapine (SEROQUEL) 50 mg Oral Tablet Take 1 Tablet (50 mg total) by mouth Every night    simethicone (MYLICON) 80 mg Oral Tablet, Chewable Chew 1 Tablet (80 mg total) Every 6 hours as needed    sumatriptan succinate (IMITREX) 100 mg Oral Tablet Take 1 Tablet (100 mg total) by mouth Once, as needed for Migraine May repeat in 2 hours in needed     tadalafil (CIALIS) 10 mg Oral Tablet Take 1 Tablet (10 mg total) by mouth Every 24 hours as needed (Patient taking differently: Take 0.5 Tablets (5 mg total) by mouth Every 24 hours as needed)    tadalafil (CIALIS) 20 mg Oral Tablet Take 1 Tablet (20 mg total) by mouth Every 24 hours as needed    tamsulosin (FLOMAX) 0.4 mg Oral Capsule Take 1 Capsule (0.4 mg total) by mouth Every evening after dinner    Testosterone 1 % (25 mg/2.5gram) Transdermal Gel in Packet Place 2 Each on the skin Once a day 2 pumps each shoulder daily am     Allergies   Allergen Reactions    Propofol Rash    Lithium      Blurred vision     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 Dr. Wilburn Cornelia    CYSTOSCOPY WITH CYSTOMETROGRAM AND URODYNAMICS N/A 12/24/2021    Performed by Morley, Mali Edwin, MD at [place] Left     HX [place] Bilateral     Left 2017 Dr. Vertell Limber. Right 2018 Dr Tommas Olp CYST REMOVAL Right 1990    right wrist and right ankle. [employer]    HX LUMBAR FUSION  2018    hardware in place--Dr. Tommas Olp SHOULDER SURGERY Bilateral     5 surgeries on right and 3 on the left    HX TURP      HX  VASECTOMY       Dr. Steffanie Dunn HERNIA REPAIR Right 1991    [employer]    LASIK Bilateral 2004     Family Medical History:       Problem Relation (Age of Onset)    No Known Problems Mother, Father, Sister, Brother, Half-Sister, Half-Brother, Maternal 65, Maternal Uncle, Paternal 71, Paternal Uncle, Maternal Grandmother, Maternal Grandfather, Paternal Grandmother, Paternal Grandfather, Daughter, Son, Other            Social History     Tobacco Use    Smoking status: Light Smoker     Types: Cigars    Smokeless tobacco: Never    Tobacco comments:     OCCASIONAL SMOKING-1 CIGAR EVERY 2 WEEKS   Vaping Use    Vaping Use: Never used   Substance Use Topics    Alcohol use: Yes     Alcohol/week: 21.0 standard drinks of alcohol     Types: 21 Cans of beer per week     Comment: 2 drinks daily    Drug use: Never        Objective     Vitals:    08/29/22 1402   BP: 124/78   Weight: 99.3 kg (219 lb)   Height: 1.727 m ('5\' 8"'$ )   BMI: 33.37        Review of Systems:  As per HPI, otherwise all other ROS negative  Negative for fever or chills, SOB, chest pain or changes in bowel or bladder habits    Physical Exam   General: NAD   HEENT: NCAT   Resp: non labored   CVS: Regular rate   Abd: non distended   Skin: warm and dry   Psych: mood/affect appropriate for clinical situation     Ext:     Bilateral lower Extremity:  Neurovascularly intact distally.  Mild TTP to the anterior groin of the right hip minimal to the left hip.  No pain to lateral hips.  He is pain on palpation to the lumbar spine and SI joints.  Pain is exacerbated with internal external rotation of the right hip.  No significant pain with passive internal external rotation of the left hip.  No significant external rotation of the right lower extremity noted on gait.    Laboratory Studies/Data Reviewed   I have reviewed all available imaging and laboratory studies as appropriate.  COMPLETE BLOOD COUNT   Lab Results   Component Value Date    WBC 6.1 03/28/2022    HGB  17.6 (H) 03/28/2022    HCT 52.8 (H) 03/28/2022    PLTCNT 222 03/28/2022       DIFFERENTIAL  Lab Results   Component Value Date    PMNS 62 03/28/2022    MONOCYTES 9 03/28/2022    BASOPHILS 0 03/28/2022    BASOPHILS <0.10 03/28/2022    PMNABS 3.72 03/28/2022    LYMPHSABS 1.50 03/28/2022    EOSABS 0.24 03/28/2022    MONOSABS 0.57 03/28/2022        COMPREHENSIVE METABOLIC PANEL   Lab Results   Component Value Date    SODIUM 141 05/04/2022    POTASSIUM 4.4 05/04/2022    CHLORIDE 104 05/04/2022    CO2 27 05/04/2022    ANIONGAP 10 05/04/2022    BUN 22 05/04/2022    CREATININE 1.26 05/04/2022    CALCIUM 9.6 05/04/2022    ALBUMIN 4.1 03/28/2022    TOTALPROTEIN 6.9 03/28/2022    ALKPHOS 67  03/28/2022    AST 18 03/28/2022    ALT 27 03/28/2022            BASIC METABOLIC PANEL  Lab Results   Component Value Date    SODIUM 141 05/04/2022    POTASSIUM 4.4 05/04/2022    CHLORIDE 104 05/04/2022    CO2 27 05/04/2022    ANIONGAP 10 05/04/2022    BUN 22 05/04/2022    CREATININE 1.26 05/04/2022    BUNCRRATIO 17 05/04/2022    GFR 62 05/04/2022    CALCIUM 9.6 05/04/2022           Imaging:  X-rays of the bilateral hips from outside facility reviewed showing no acute fractures or dislocations.  There is degenerative change with osteophyte formation and sclerosing the bilateral hips, right worse than left.    Assessment/Plan   Assessment:    ICD-10-CM    1. 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  At this time patient like to proceed with corticosteroid injection to the right hip joint.  He has not interested in injection to left hip at this time.  Has plans with pain management to have steroid injections to his lumbar spine and then subsequent injections to cervical spine 4-6 weeks later, he will call our office to let us know when he would like his right hip injected with  steroid.  Discussed with patient this will be done at [facility] with surgeon.  When patient calls back we can have him scheduled with surgeon (no preference) for procedure H&P.    Follow up:  No follow-ups on file.    Waunita Schooner, PA-C    I am seeing this patient independently with supervising physician present in clinic.     This note was partially generated using MModal Fluency Direct system, and there may be some incorrect words, spellings, and punctuation that were not noted in checking the note before saving

## 2022-08-29 NOTE — Telephone Encounter (Signed)
X3 LEFT MESSAGE TO CALL AND SCHEDULE INJECTION MAILED PAPERS. Sean Wall

## 2022-09-01 ENCOUNTER — Telehealth (HOSPITAL_BASED_OUTPATIENT_CLINIC_OR_DEPARTMENT_OTHER): Payer: Self-pay | Admitting: Pain Medicine

## 2022-09-01 NOTE — Telephone Encounter (Signed)
Called patient to schedule LESI / CESI . Scheduled for 09-12-22 at 9:45 arrive at 8:45. CESI 10-12-22 at 10:15 arrive at 9:15. Scheduled follow up for 11-11-22 at 8:30.  Went over instructions and mailed instructions and appt reminders to patient.     Jeanne Ivan

## 2022-09-12 ENCOUNTER — Other Ambulatory Visit: Payer: Self-pay

## 2022-09-12 ENCOUNTER — Encounter (HOSPITAL_COMMUNITY): Payer: Self-pay | Admitting: Pain Medicine

## 2022-09-12 ENCOUNTER — Inpatient Hospital Stay (HOSPITAL_COMMUNITY)
Admission: RE | Admit: 2022-09-12 | Discharge: 2022-09-12 | Disposition: A | Discharge status: Home / Self Care | Payer: 59 | Source: Ambulatory Visit

## 2022-09-12 ENCOUNTER — Encounter (HOSPITAL_COMMUNITY)
Admission: RE | Disposition: A | Discharge status: Home / Self Care | Payer: Self-pay | Source: Ambulatory Visit | Attending: Pain Medicine

## 2022-09-12 ENCOUNTER — Other Ambulatory Visit (HOSPITAL_BASED_OUTPATIENT_CLINIC_OR_DEPARTMENT_OTHER): Payer: Self-pay | Admitting: Pain Medicine

## 2022-09-12 ENCOUNTER — Encounter (HOSPITAL_COMMUNITY): Payer: 59 | Admitting: Pain Medicine

## 2022-09-12 ENCOUNTER — Inpatient Hospital Stay
Admission: RE | Admit: 2022-09-12 | Discharge: 2022-09-12 | Disposition: A | Discharge status: Home / Self Care | Payer: 59 | Source: Ambulatory Visit | Attending: Pain Medicine | Admitting: Pain Medicine

## 2022-09-12 DIAGNOSIS — M549 Dorsalgia, unspecified: Secondary | ICD-10-CM

## 2022-09-12 DIAGNOSIS — M4726 Other spondylosis with radiculopathy, lumbar region: Secondary | ICD-10-CM | POA: Insufficient documentation

## 2022-09-12 SURGERY — ~~LOC~~ OR MINOR EPIDURAL STEROID INJECTION
Anesthesia: Local (Nurse-Monitored) | Laterality: Left | Wound class: Clean Wound: Uninfected operative wounds in which no inflammation occurred

## 2022-09-12 MED ORDER — IOPAMIDOL 200 MG IODINE/ML (41 %) INTRATHECAL SOLUTION
Freq: Once | INTRATHECAL | Status: DC | PRN
Start: 2022-09-12 — End: 2022-09-12
  Administered 2022-09-12: 3 mL via INTRAMUSCULAR

## 2022-09-12 MED ORDER — LIDOCAINE HCL 10 MG/ML (1 %) INJECTION SOLUTION
Freq: Once | INTRAMUSCULAR | Status: DC | PRN
Start: 2022-09-12 — End: 2022-09-12
  Administered 2022-09-12: 5 mL via INTRAMUSCULAR

## 2022-09-12 MED ORDER — TRIAMCINOLONE ACETONIDE 80 MG/ML SUSPENSION FOR INJECTION
Freq: Once | INTRAMUSCULAR | Status: DC | PRN
Start: 2022-09-12 — End: 2022-09-12
  Administered 2022-09-12: 80 mg via INTRAMUSCULAR

## 2022-09-12 MED ORDER — SODIUM CHLORIDE 0.9 % INJECTION SOLUTION
Freq: Once | INTRAMUSCULAR | Status: DC | PRN
Start: 2022-09-12 — End: 2022-09-12
  Administered 2022-09-12: 10 mL via INTRAMUSCULAR

## 2022-09-12 MED ORDER — IOPAMIDOL 200 MG IODINE/ML (41 %) INTRATHECAL SOLUTION
3.0000 mL | INTRATHECAL | Status: AC
Start: 2022-09-12 — End: 2022-09-12
  Administered 2022-09-12: 3 mL via EPIDURAL

## 2022-09-12 SURGICAL SUPPLY — 12 items
APPL ISPRP CHG 10.5ML CHLRPRP HI-LT ORNG PREP STRL LF (MED SURG SUPPLIES) ×1 IMPLANT
BAG 36X36IN BAND EQP (DRAPE/PACKS/SHEETS/OR TOWEL) IMPLANT
CONV USE 163322 - SYRINGE 20ML LF  STRL LL MED (MED SURG SUPPLIES) IMPLANT
CONV USE ITEM 321827 - GLOVE SURG 7.5 LF PF CLS STRL_BRN TINT 12IN PROTEXIS (GLOVES AND ACCESSORIES) ×1 IMPLANT
COVER RIGID STRL LF  CNVRT LIGHT HNDL PLASTIC DISP GRN (MED SURG SUPPLIES) ×1 IMPLANT
DISC NO SUB - TRAY EPIDRL 28X22IN 18GA 27GA LL TUOHY 1.25IN ASST VOL OVAL 10% PVP IOD PLASTIC 1 SHOT NEEDLE STICK (MED SURG SUPPLIES) ×1 IMPLANT
NEEDLE EPIDRL YW 4.5IN 20GA TUOHY METAL PLASTIC BVL STY REM WNG SLIDE DEPTH INDICATOR STRL LF  DISP (MED SURG SUPPLIES) IMPLANT
NEEDLE SPINAL 3.5IN 22GA SPNCN QUINCKE BPA PVC FREE DEHP-FR STRL LF (MED SURG SUPPLIES) IMPLANT
NEEDLE SPINAL BLK 5IN 22GA QUINCKE LONG LGTH REG WL POLYPROP STRL LF  DISP (MED SURG SUPPLIES) IMPLANT
NERVE BLOCK TRAY UNIV STRL LF  DISP (MED SURG SUPPLIES)
SYRINGE 20ML LF  STRL LL MED (MED SURG SUPPLIES)
TRAY UNIV NERVE BLOCK DISP SNG USE LF STRL (MED SURG SUPPLIES) IMPLANT

## 2022-09-12 NOTE — Discharge Instructions (Signed)
[place], [place] [address] - Phone: ([phone])      Complications (Reasons to call):  Temperature greater than 101 degrees  Unusual redness or swelling at the injection site  Persistent nausea or vomiting or headache  Persistent weakness, numbness or bleeding  Loss of bowel or bladder control  If you are unable to reach your physician and your symptoms are severe, have yourself brought to the nearest Emergency Department or call 911    You may experience:  You may also experience a temporary increase in the level of your pain  You may experience weakness, tingling or heavy feelings in your legs the first few hours after your procedure, requiring you to be cautious when ambulating (walking).   Do NOT drive a car or operate heavy machinery for 24 hours after your procedure    Medications:  Most medications held for your procedure may be resumed one day after the procedure. Please ask your physician if you have questions about specific medications or the procedure itself.    Comfort measures:  You may use ice over the injection site  AVOID HEAT for the first 24 hours  After that ice or heat may be used as needed. DO NOT apply LONGER than 20 minutes and wait 20 minutes before reapplication  Avoid sitting in a bathtub, hot tub, pool, etc. for 3 days after your procedure    Activity:  Rest at home for the next 24 hours  Then increase activity as tolerated  Typically it is ok to return to work one day after the procedure      It is recommended that you do not receive any vaccinations 7 days prior to a pain clinic procedure, or 7 days after a pain clinic procedure. Side effects from the vaccine may be similar to those that you might experience as an adverse reaction to a pain clinic procedure.

## 2022-09-12 NOTE — Nurses Notes (Signed)
[place] Pain Rating Scale     On a scale of 0-10, during the past 24 hours, pain has interfered with you usual activity: 7     On a scale of 0-10, during the past 24 hours, pain has interfered with your sleep: 7    On a scale of 0-10, during the past 24 hours, pain has affected your mood: 7     On a scale of 0-10, during the past 24 hours, pain has contributed to your stress: 7     On a scale of 0-10, what is your overall pain Rating: 7

## 2022-09-12 NOTE — OR Surgeon (Signed)
PATIENT NAME: Sean Wall  MEDICAL RECORD NUMBER: [id number]  DATE OF BIRTH: 1956-05-19  DATE OF PROCEDURE: 09/12/22  PROCEDURE: Lumbar interlaminar left parasagittal epidural steroid injection under fluoroscopic guidance at the L2-L3 level.  PERFORMING PHYSICIAN: Marygrace Drought, DO  PRE-PROCEDURE DIAGNOSIS: Lumbar spondylosis, Lumbar radiculopathy, Low back pain  POST-PROCEDURE DIAGNOSIS: Diagnosis unchanged  ANESTHESIA: Local  ESTIMATED BLOOD LOSS: Minimal  COMPLICATIONS: None  FINDINGS: None  CONSENT: Today's procedure, its potential benefits as well as its risks and potential side effects were reviewed. Discussed risks of the procedure include bleeding, infection, nerve irritation or damage, reactions medications administered, headache, failure of the pain to improve, and exacerbation of the pain. These risks were explained to the patient, who verbalized understanding and who wished to proceed. Informed consent was signed.  DESCRIPTION OF THE PROCEDURE: After written informed consent was obtained, the patient was taken to the fluoroscopy suite and placed in the prone position. Anatomical landmarks were identified by way of fluoroscopy in multiple views. The skin of the lumbar region was prepped using antiseptic applied solution and draped in the usual sterile fashion. Strict aseptic technique was utilized.   The skin and subcutaneous tissues at the needle entry site were infiltrated with 2 mL of 1% preservative-free lidocaine using a 25-gauge 1-1/2-inch needle. A Tuohy needle was then incrementally advanced under fluoroscopy using a loss of resistance technique. Upon entering into the epidural space, a positive loss of resistance to air was noted and a characteristic pop was felt. Proper placement into the epidural space was confirmed with fluoroscopy in multiple views and by continued loss of resistance after injection of 1 mL of sterile preservative-free normal saline. There were no paresthesias reported. After  negative aspiration for CSF or heme, Isovue-M 200 was used to delineate epidural spread.  After negative aspiration again, an injectate consisting of 1 ml of Kenalog 80 mg/mL mixed with 5 mL of preservative-free normal saline was slowly administered.   The patient tolerated the procedure well and all needles were removed intact. Hemostasis was maintained. There were no apparent paresthesias or complications. The skin was wiped clean and a Band-Aid was placed as appropriate. The patient was monitored for an appropriate period of time following the procedure and remained hemodynamically stable and neurovascularly intact.   The patient was ultimately discharged to home with supervision in good condition and instructed to follow up in office in approximately 14 days or sooner as warranted.     Marygrace Drought, DO  09/12/2022, 21:54

## 2022-09-12 NOTE — Nurses Notes (Signed)
Meds on field, see MD notes for amounts used.

## 2022-09-14 ENCOUNTER — Telehealth (HOSPITAL_BASED_OUTPATIENT_CLINIC_OR_DEPARTMENT_OTHER): Payer: Self-pay | Admitting: Pain Medicine

## 2022-09-14 NOTE — Telephone Encounter (Signed)
Called to see how patient was doing after their Lumbar Epidural Steroid injection on 09/12/22 with Dr. Rip Harbour, and what percent of relief they had. No answer, left message to return our call    Murvin Donning, MA

## 2022-09-15 ENCOUNTER — Telehealth (INDEPENDENT_AMBULATORY_CARE_PROVIDER_SITE_OTHER): Payer: Self-pay | Admitting: PHYSICIAN ASSISTANT, SURGICAL

## 2022-09-15 NOTE — Telephone Encounter (Signed)
Patient called in, he saw Ebony Hail 08-29-22  he was supposed to call back with dates of his other injections that he gets.  He has back injection 09-11-22  Neck injection 10-12-22  And a follow up visit 11-11-22  He said he can get injections 7 days before or 7 days after.   Any questions please call him.

## 2022-09-15 NOTE — Telephone Encounter (Signed)
Please see message. Thank you     Gracy Bruins, CMA

## 2022-09-19 NOTE — Telephone Encounter (Signed)
If patient would like to proceed with right hip corticosteroid injection under fluoroscopy we can get him scheduled for H&P visit with Dr. Cornelius Moras or Dr. Thurmond Butts at patient's convenience within his injection schedule with pain management.

## 2022-09-22 NOTE — Telephone Encounter (Signed)
LM for patient to call back.     Gracy Bruins, CMA

## 2022-09-26 NOTE — Telephone Encounter (Signed)
Patient notified and would like to continue with plans for the CS injection under fluoroscopy. Patient is awaiting call to be scheduled. Thank you    Gracy Bruins, CMA

## 2022-09-27 ENCOUNTER — Other Ambulatory Visit: Payer: Self-pay

## 2022-10-03 NOTE — Telephone Encounter (Signed)
Spoke with patient on 09/26/2022 and scheduled him for procedure and H&P appointment. He verbalized understanding and was agreeable. Jola Babinski, MA

## 2022-10-12 ENCOUNTER — Encounter (HOSPITAL_COMMUNITY)
Admission: RE | Disposition: A | Discharge status: Home / Self Care | Payer: Self-pay | Source: Ambulatory Visit | Attending: Pain Medicine

## 2022-10-12 ENCOUNTER — Other Ambulatory Visit (HOSPITAL_BASED_OUTPATIENT_CLINIC_OR_DEPARTMENT_OTHER): Payer: Self-pay | Admitting: Pain Medicine

## 2022-10-12 ENCOUNTER — Inpatient Hospital Stay (HOSPITAL_COMMUNITY)
Admission: RE | Admit: 2022-10-12 | Discharge: 2022-10-12 | Disposition: A | Discharge status: Home / Self Care | Payer: 59 | Source: Ambulatory Visit

## 2022-10-12 ENCOUNTER — Encounter (HOSPITAL_COMMUNITY): Payer: 59 | Admitting: Pain Medicine

## 2022-10-12 ENCOUNTER — Other Ambulatory Visit: Payer: Self-pay

## 2022-10-12 ENCOUNTER — Inpatient Hospital Stay
Admission: RE | Admit: 2022-10-12 | Discharge: 2022-10-12 | Disposition: A | Discharge status: Home / Self Care | Payer: 59 | Source: Ambulatory Visit | Attending: Pain Medicine | Admitting: Pain Medicine

## 2022-10-12 DIAGNOSIS — M549 Dorsalgia, unspecified: Secondary | ICD-10-CM

## 2022-10-12 DIAGNOSIS — G8929 Other chronic pain: Secondary | ICD-10-CM | POA: Insufficient documentation

## 2022-10-12 DIAGNOSIS — M4722 Other spondylosis with radiculopathy, cervical region: Secondary | ICD-10-CM | POA: Insufficient documentation

## 2022-10-12 SURGERY — ~~LOC~~ OR MINOR EPIDURAL STEROID INJECTION
Anesthesia: Local (Nurse-Monitored) | Wound class: Clean Wound: Uninfected operative wounds in which no inflammation occurred

## 2022-10-12 MED ORDER — IOPAMIDOL 200 MG IODINE/ML (41 %) INTRATHECAL SOLUTION
3.0000 mL | INTRATHECAL | Status: AC
Start: 2022-10-12 — End: 2022-10-12
  Administered 2022-10-12: 3 mL via EPIDURAL

## 2022-10-12 MED ORDER — SODIUM CHLORIDE 0.9 % INJECTION SOLUTION
Freq: Once | INTRAMUSCULAR | Status: DC | PRN
Start: 2022-10-12 — End: 2022-10-12
  Administered 2022-10-12: 20 mL via INTRAMUSCULAR

## 2022-10-12 MED ORDER — IOPAMIDOL 200 MG IODINE/ML (41 %) INTRATHECAL SOLUTION
Freq: Once | INTRATHECAL | Status: DC | PRN
Start: 2022-10-12 — End: 2022-10-12
  Administered 2022-10-12: 3 mL via INTRAMUSCULAR

## 2022-10-12 MED ORDER — LIDOCAINE HCL 10 MG/ML (1 %) INJECTION SOLUTION
Freq: Once | INTRAMUSCULAR | Status: DC | PRN
Start: 2022-10-12 — End: 2022-10-12
  Administered 2022-10-12: 5 mL via INTRAMUSCULAR

## 2022-10-12 MED ORDER — DEXAMETHASONE SODIUM PHOSPHATE 10 MG/ML INJECTION SOLUTION
Freq: Once | INTRAMUSCULAR | Status: DC | PRN
Start: 2022-10-12 — End: 2022-10-12
  Administered 2022-10-12: 20 mg via INTRAMUSCULAR

## 2022-10-12 SURGICAL SUPPLY — 12 items
APPL ISPRP CHG 10.5ML CHLRPRP HI-LT ORNG PREP STRL LF (MED SURG SUPPLIES) ×1 IMPLANT
BAG 36X36IN BAND EQP (DRAPE/PACKS/SHEETS/OR TOWEL) IMPLANT
CONV USE 163322 - SYRINGE 20ML LF  STRL LL MED (MED SURG SUPPLIES) IMPLANT
CONV USE ITEM 321827 - GLOVE SURG 7.5 LF PF CLS STRL_BRN TINT 12IN PROTEXIS (GLOVES AND ACCESSORIES) ×1 IMPLANT
COVER RIGID STRL LF  CNVRT LIGHT HNDL PLASTIC DISP GRN (MED SURG SUPPLIES) ×1 IMPLANT
DISC NO SUB - TRAY EPIDRL 28X22IN 18GA 27GA LL TUOHY 1.25IN ASST VOL OVAL 10% PVP IOD PLASTIC 1 SHOT NEEDLE STICK (MED SURG SUPPLIES) ×1 IMPLANT
NEEDLE EPIDRL YW 4.5IN 20GA TUOHY METAL PLASTIC BVL STY REM WNG SLIDE DEPTH INDICATOR STRL LF  DISP (MED SURG SUPPLIES) IMPLANT
NEEDLE SPINAL 3.5IN 22GA SPNCN QUINCKE BPA PVC FREE DEHP-FR STRL LF (MED SURG SUPPLIES) IMPLANT
NEEDLE SPINAL BLK 5IN 22GA QUINCKE LONG LGTH REG WL POLYPROP STRL LF  DISP (MED SURG SUPPLIES) IMPLANT
NERVE BLOCK TRAY UNIV STRL LF  DISP (MED SURG SUPPLIES)
SYRINGE 20ML LF  STRL LL MED (MED SURG SUPPLIES)
TRAY UNIV NERVE BLOCK DISP SNG USE LF STRL (MED SURG SUPPLIES) IMPLANT

## 2022-10-12 NOTE — Nurses Notes (Signed)
Meds on field, see MD notes for amounts used.

## 2022-10-12 NOTE — OR Surgeon (Signed)
PATIENT NAME:  Sean Wall    MR#:  [id number]    DATE OF BIRTH:  1955-09-22    DATE OF PROCEDURE:  10/12/22    PROCEDURE:  Cervical epidural steroid injection under fluoroscopic guidance at the C7-T1 level.    PERFORMING PHYSICIAN:  Marygrace Drought, DO    PRE-PROCEDURE DIAGNOSIS:  Cervical radiculopathy, cervical spondylosis, chronic neck pain    POST-PROCEDURE DIAGNOSIS:  Diagnosis unchanged    ANESTHESIA:  Local    ESTIMATED BLOOD LOSS:  Minimal    COMPLICATIONS:  None    CONSENT:  Today's procedure, its potential benefits as well as its risks and potential side effects were reviewed.  Discussed risks of the procedure include bleeding, infection, nerve irritation or damage, reactions medications administered, headache, failure of the pain to improve, and exacerbation of the pain.  These risks were explained to the patient, who verbalized understanding and who wished to proceed.  Informed consent was signed.    DESCRIPTION OF PROCEDURE:  After written informed consent was obtained, the patient was taken to the fluoroscopy suite and placed in the prone position.  Anatomical landmarks were identified by way of fluoroscopy in multiple views.      The skin of the cervical region was prepped using antiseptic and draped in the usual sterile fashion.  Strict aseptic technique was utilized.  The skin and subcutaneous tissues at the needle entry site were infiltrated with 3 mL of 1% preservative-free lidocaine using 25-gauge 1-1/2-inch needle.      A Tuohy needle was then incrementally advanced under fluoroscopy using a loss-of-resistance technique.  Upon entering into the epidural space, a positive loss of resistance to air was noted and a characteristic pop was felt.  Proper placement into the epidural space was confirmed with a hanging column technique where preservative -free normal saline was noted to flow freely to gravity into the epidural space.  There were no parasthesias reported.  After negative aspiration for CSF or  heme, Isovue 200 M was used to delineate epidural spread.  After negative aspiration was reconfirmed, an injectate consisting of 1.6 mL of dexamethasone 10 mg/mL mixed with 2.4 mL of preservative-free normal saline was slowly administered.      The patient tolerated the procedure well and all needles were removed intact.  Hemostasis was maintained.  There were no apparent paresthesias or complications.  The skin was wiped clean, and a Band-Aid was placed as appropriate.  The patient was monitored for an appropriate period of time following the procedure and remained hemodynamically stable and neurovascularly intact.  The patient was ultimately discharged to home with supervision in good condition and instructed to follow up in the office in approximately 14 days or sooner as warranted.     Marygrace Drought, DO  10/12/2022, 16:04

## 2022-10-12 NOTE — Discharge Instructions (Signed)
[facility]  AT [place] - , [place] [address] - Phone: ([phone])      Complications (Reasons to call):  . Temperature greater than 101 degrees  . Unusual redness or swelling at the injection site  . Persistent nausea or vomiting or headache  . Persistent weakness, numbness or bleeding  . Loss of bowel or bladder control  . If you are unable to reach your physician and your symptoms are severe, have yourself brought to the nearest Emergency Department or call 911    You may experience:  . You may also experience a temporary increase in the level of your pain  . You may experience weakness, tingling or heavy feelings in your legs the first few hours after your procedure, requiring you to be cautious when ambulating (walking).   . Do NOT drive a car or operate heavy machinery for 24 hours after your procedure    Medications:  Most medications held for your procedure may be resumed one day after the procedure. Please ask your physician if you have questions about specific medications or the procedure itself.    Comfort measures:  . You may use ice over the injection site  . AVOID HEAT for the first 24 hours  . After that ice or heat may be used as needed. DO NOT apply LONGER than 20 minutes and wait 20 minutes before reapplication  . Avoid sitting in a bathtub, hot tub, pool, etc. for 3 days after your procedure    Activity:  . Rest at home for the next 24 hours  . Then increase activity as tolerated  . Typically it is ok to return to work one day after the procedure      It is recommended that you do not receive any vaccinations 7 days prior to a pain clinic procedure, or 7 days after a pain clinic procedure. Side effects from the vaccine may be similar to those that you might experience as an adverse reaction to a pain clinic procedure.

## 2022-10-12 NOTE — Nurses Notes (Signed)
[place] Pain Rating Scale     On a scale of 0-10, during the past 24 hours, pain has interfered with you usual activity: 3     On a scale of 0-10, during the past 24 hours, pain has interfered with your sleep: 3    On a scale of 0-10, during the past 24 hours, pain has affected your mood: 4     On a scale of 0-10, during the past 24 hours, pain has contributed to your stress: 4     On a scale of 0-10, what is your overall pain Rating: 6

## 2022-10-13 ENCOUNTER — Telehealth (HOSPITAL_BASED_OUTPATIENT_CLINIC_OR_DEPARTMENT_OTHER): Payer: Self-pay | Admitting: Pain Medicine

## 2022-10-13 NOTE — Telephone Encounter (Signed)
Spoke with pt regarding Cervical Epiudral Steroid Injection on 10/12/22. Pt reports 65% relief. Informed pt to contact us with any questions/concerns.  Murvin Donning, MA

## 2022-10-17 ENCOUNTER — Other Ambulatory Visit: Payer: Self-pay

## 2022-10-17 ENCOUNTER — Encounter (INDEPENDENT_AMBULATORY_CARE_PROVIDER_SITE_OTHER): Payer: Self-pay | Admitting: Orthopaedic Surgery

## 2022-10-17 ENCOUNTER — Ambulatory Visit (INDEPENDENT_AMBULATORY_CARE_PROVIDER_SITE_OTHER): Payer: Commercial Managed Care - PPO | Admitting: Orthopaedic Surgery

## 2022-10-17 VITALS — BP 116/72 | HR 74 | Ht 67.99 in | Wt 209.4 lb

## 2022-10-17 DIAGNOSIS — M160 Bilateral primary osteoarthritis of hip: Secondary | ICD-10-CM

## 2022-10-17 DIAGNOSIS — Z01818 Encounter for other preprocedural examination: Secondary | ICD-10-CM

## 2022-10-17 NOTE — Progress Notes (Signed)
[facility]    Progress Note    Name: Sean Wall MRN:  [id number]   Date: 10/17/2022 Age: 67 y.o.       Chief Complaint: Pre-OP H & P (Fluoro guided Right Hip Corticosteroid Injection- 10/25/2022)    History of Present Illness   Sean Wall is a 67 y.o. male who presents with bilateral hip pain, right worse than left.  Patient unable to quantify duration of pain.  Pain is localized to the anterior groin.  He reports minimal pain to his left side.  Pain worsening with ambulation and weight-bearing.  He has noticed his leg has started to externally rotate.  No history of therapy or injections for the hips. Patient does follow with pain management.  History of L3-L5 lumbar fusion in [place] in 2018.  History of C7/T1 CESI on 06/15/2022.  Pain management has plans on lumbar corticosteroid injections to L2/L3.  He also complains of radicular pain to bilateral lower extremities.  Taking OTC ibuprofen Tylenol for pain relief.  He had x-rays of the bilateral hips done by the [facility] showing arthritis of bilateral hips and was sent to Orthopedics for possible corticosteroid injection.  Ambulates without assistive device.    10/17/22 - patient comes in the clinic today to talk about a steroid injection to his right hip.  He has had longstanding right groin pain.  He also has a lot issues in his back.  He has had multiple injections into his knees neck and lumbar spine.  He states he does have groin pain on the right.  He also notes that his right leg is starting to externally rotate more.  He does have some issues getting in and out of a car.    Medical History     Current Outpatient Medications   Medication Sig    atorvastatin (LIPITOR) 40 mg Oral Tablet Take 1 Tablet (40 mg total) by mouth Every evening    azelastine-fluticasone 137-50 mcg/spray Nasal Spray, Non-Aerosol Administer 2 Sprays into affected nostril(s) Every night    bisacodyL  (DULCOLAX) 5 mg Oral Tablet, Delayed Release (E.C.) Take 2 Tablets (10 mg total) by mouth Every 24 hours as needed for Constipation for up to 1 dose To be taken in the evening    buPROPion (WELLBUTRIN XL) 300 mg extended release 24 hr tablet Take 1 Tablet (300 mg total) by mouth Once a day    cetirizine (ZYRTEC) 10 mg Oral Tablet Take 1 Tablet (10 mg total) by mouth Once a day    cyanocobalamin (VITAMIN B12) 1,000 mcg/mL Injection Solution 1 mL (1,000 mcg total) Every 30 days    cyclobenzaprine (FLEXERIL) 10 mg Oral Tablet Take 1 Tablet (10 mg total) by mouth Three times a day as needed    dextroamphetamine-amphetamine (ADDERALL) 10 mg Oral Tablet Take 1 Tablet (10 mg total) by mouth Once a day    ferrous sulfate (FERATAB) 324 mg (65 mg iron) Oral Tablet, Delayed Release (E.C.) Take 1 Tablet (324 mg total) by mouth Every other day On Monday, Wednesday, friday    finasteride (PROSCAR) 5 mg Oral Tablet Take 1 Tablet (5 mg total) by mouth Once a day    gabapentin (NEURONTIN) 300 mg Oral Capsule Take 1 Capsule (300 mg total) by mouth Three times a day    HERBAL DRUGS ORAL Take 700 mg by mouth Three times a day PEPPERMINT LEAF    HYDROcodone-acetaminophen (NORCO) 5-325 mg Oral Tablet  Take 1 Tablet by mouth Every 8 hours as needed for Pain    HYDROcodone-acetaminophen (NORCO) 5-325 mg Oral Tablet Take 1-2 Tablets by mouth Every 6 hours as needed for Pain    Ibuprofen (MOTRIN) 600 mg Oral Tablet Take 1 Tablet (600 mg total) by mouth Four times a day as needed for Pain (Patient not taking: Reported on 09/12/2022)    lactobacillus comb no.10 (PROBIOTIC) 20 billion cell Oral Capsule Take 1 Capsule by mouth Once a day    melatonin 3 mg Oral Capsule Take 2 Capsules (6 mg total) by mouth Every night    metoprolol tartrate (LOPRESSOR) 25 mg Oral Tablet Take 1 Tablet (25 mg total) by mouth Once a day    pantoprazole (PROTONIX) 20 mg Oral Tablet, Delayed Release (E.C.) Take 1 Tablet (20 mg total) by mouth Every morning before  breakfast    predniSONE (DELTASONE) 10 mg Oral Tablet 4 TABLETS QAM X 3 DAYS, THEN 3 TABLETS QAM X 3 DAYS, THEN 2 TABLETS QAM X 3 DAYS, THEN 1 TABLET QAM X 3 DAYS    QUEtiapine (SEROQUEL) 50 mg Oral Tablet Take 1 Tablet (50 mg total) by mouth Every night    simethicone (MYLICON) 80 mg Oral Tablet, Chewable Chew 1 Tablet (80 mg total) Every 6 hours as needed    sumatriptan succinate (IMITREX) 100 mg Oral Tablet Take 1 Tablet (100 mg total) by mouth Once, as needed for Migraine May repeat in 2 hours in needed    tadalafil (CIALIS) 10 mg Oral Tablet Take 1 Tablet (10 mg total) by mouth Every 24 hours as needed (Patient taking differently: Take 0.5 Tablets (5 mg total) by mouth Every 24 hours as needed)    tadalafil (CIALIS) 20 mg Oral Tablet Take 1 Tablet (20 mg total) by mouth Every 24 hours as needed    tamsulosin (FLOMAX) 0.4 mg Oral Capsule Take 1 Capsule (0.4 mg total) by mouth Every evening after dinner    Testosterone 1 % (25 mg/2.5gram) Transdermal Gel in Packet Place 2 Each on the skin Once a day 2 pumps each shoulder daily am     Allergies   Allergen Reactions    Propofol Rash    Lithium      Blurred vision     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    HTN (hypertension)     Hyperlipidemia     Ischemic colitis (CMS [place])     Sleep apnea     Tinnitus          Past Surgical History:   Procedure Laterality Date    C7-T1 CERVICAL EPIDURAL STEROID INJECTION N/A 10/12/2022    Performed by Marygrace Drought, DO at [facility]    C7-T1 FAVOR RIGHT CERVICAL EPIDURAL STEROID INJECTION N/A 06/15/2022    Performed by Marygrace Drought, DO at [facility]    COLONOSCOPY  04/09/2021    COLONOSCOPY WITH BIOPSY N/A 04/09/2021    Performed by Brooks Sailors, MD at [facility]  POLYPECTOMY N/A 03/15/2022    Performed by Brooks Sailors, MD at [facility]    CPAP  2005  deviated septum repair. Dr. Wilburn Cornelia    CYSTOSCOPY WITH CYSTOMETROGRAM AND URODYNAMICS N/A 12/24/2021    Performed by Morley, Mali Edwin, MD at [facility] Left     HX [place] Bilateral     Left 2017 Dr. Vertell Limber. Right 2018 Dr Tommas Olp CYST REMOVAL Right 1990    right wrist and right ankle. [employer]    HX LUMBAR FUSION  2018    hardware in place--Dr. Tommas Olp SHOULDER SURGERY Bilateral     5 surgeries on right and 3 on the left    HX TURP      HX VASECTOMY      Dr. Steffanie Dunn HERNIA REPAIR Right 1991    [employer]    LASIK Bilateral 2004    LEFT L2-3 LUMBAR EPIDURAL STEROID INJECTION Left 09/12/2022    Performed by Marygrace Drought, DO at [facility]     Family Medical History:       Problem Relation (Age of Onset)    No Known Problems Mother, Father, Sister, Brother, Half-Sister, Half-Brother, Maternal 19, Maternal Uncle, Paternal 49, Paternal Uncle, Maternal Grandmother, Maternal Grandfather, Paternal Grandmother, Paternal Grandfather, Daughter, Son, Other            Social History     Tobacco Use    Smoking status: Light Smoker     Types: Cigars    Smokeless tobacco: Never    Tobacco comments:     OCCASIONAL SMOKING-1 CIGAR EVERY 2 WEEKS   Vaping Use    Vaping status: Never Used   Substance Use Topics    Alcohol use: Yes     Alcohol/week: 21.0 standard drinks of alcohol     Types: 21 Cans of beer per week     Comment: 2 drinks daily    Drug use: Never        Objective     Vitals:    10/17/22 0822   BP: 116/72   Pulse: 74   SpO2: 95%   Weight: 95 kg (209 lb 7 oz)   Height: 1.727 m (5' 7.99")   BMI: 31.92        Review of Systems:  As per HPI, otherwise all other ROS negative  Negative for fever or chills, SOB, chest pain or changes in bowel or bladder habits    Physical Exam   General: NAD   HEENT: NCAT   Resp: non labored   CVS: Regular  rate   Abd: non distended   Skin: warm and dry   Psych: mood/affect appropriate for clinical situation     Ext:     Bilateral lower Extremity:  Neurovascularly intact distally.  Patient does have pain with internal rotation of the hip.  This localizes to the groin.    Laboratory Studies/Data Reviewed   I have reviewed all available imaging and laboratory studies as appropriate.  COMPLETE BLOOD COUNT   Lab Results   Component Value Date    WBC 6.1 03/28/2022    HGB 17.6 (H) 03/28/2022    HCT 52.8 (H) 03/28/2022    PLTCNT 222 03/28/2022       DIFFERENTIAL  Lab Results   Component Value Date    PMNS 62 03/28/2022    MONOCYTES 9 03/28/2022    BASOPHILS 0 03/28/2022    BASOPHILS <0.10 03/28/2022    PMNABS  3.72 03/28/2022    LYMPHSABS 1.50 03/28/2022    EOSABS 0.24 03/28/2022    MONOSABS 0.57 03/28/2022        COMPREHENSIVE METABOLIC PANEL   Lab Results   Component Value Date    SODIUM 141 05/04/2022    POTASSIUM 4.4 05/04/2022    CHLORIDE 104 05/04/2022    CO2 27 05/04/2022    ANIONGAP 10 05/04/2022    BUN 22 05/04/2022    CREATININE 1.26 05/04/2022    CALCIUM 9.6 05/04/2022    ALBUMIN 4.1 03/28/2022    TOTALPROTEIN 6.9 03/28/2022    ALKPHOS 67 03/28/2022    AST 18 03/28/2022    ALT 27 03/28/2022            BASIC METABOLIC PANEL  Lab Results   Component Value Date    SODIUM 141 05/04/2022    POTASSIUM 4.4 05/04/2022    CHLORIDE 104 05/04/2022    CO2 27 05/04/2022    ANIONGAP 10 05/04/2022    BUN 22 05/04/2022    CREATININE 1.26 05/04/2022    BUNCRRATIO 17 05/04/2022    GFR 62 05/04/2022    CALCIUM 9.6 05/04/2022           Imaging:  X-rays of the bilateral hips from outside facility reviewed showing no acute fractures or dislocations.  There is degenerative change with osteophyte formation and sclerosing the bilateral hips, right worse than left.    Assessment/Plan   Assessment:    ICD-10-CM    1. Preop testing  Z01.818 CBC/DIFF     BASIC METABOLIC PANEL     ECG 12 LEAD          Plan:  Bilateral hip osteoarthritis      Risks benefits and alternatives to the procedure were explained the patient.  Risks include were not limited to progression of arthritis, pain, damage to local structures, anesthesia risk, bleeding, thrombosis, etc..  Patient accepts these risks and wants to move forward with the procedure.  Procedure will be a right corticosteroid injection to the hip under fluoroscopic guidance.  Consent was signed.  Patient agreeable plan.    Blane Ohara, MD  Department of Orthopaedics

## 2022-10-18 ENCOUNTER — Other Ambulatory Visit: Payer: Self-pay

## 2022-10-19 ENCOUNTER — Encounter (HOSPITAL_COMMUNITY): Payer: Self-pay

## 2022-10-19 ENCOUNTER — Inpatient Hospital Stay
Admission: RE | Admit: 2022-10-19 | Discharge: 2022-10-19 | Disposition: A | Discharge status: Home / Self Care | Payer: 59 | Source: Ambulatory Visit | Attending: Orthopaedic Surgery | Admitting: Orthopaedic Surgery

## 2022-10-19 HISTORY — DX: Personal history of other diseases of the nervous system and sense organs: Z86.69

## 2022-10-19 HISTORY — DX: Presence of spectacles and contact lenses: Z97.3

## 2022-10-25 ENCOUNTER — Inpatient Hospital Stay (HOSPITAL_COMMUNITY)
Admission: RE | Admit: 2022-10-25 | Discharge: 2022-10-25 | Disposition: A | Discharge status: Home / Self Care | Payer: 59 | Source: Ambulatory Visit

## 2022-10-25 ENCOUNTER — Encounter (HOSPITAL_COMMUNITY)
Admission: RE | Disposition: A | Discharge status: Home / Self Care | Payer: Self-pay | Source: Ambulatory Visit | Attending: Orthopaedic Surgery

## 2022-10-25 ENCOUNTER — Ambulatory Visit (HOSPITAL_COMMUNITY): Payer: 59

## 2022-10-25 ENCOUNTER — Inpatient Hospital Stay
Admission: RE | Admit: 2022-10-25 | Discharge: 2022-10-25 | Disposition: A | Discharge status: Home / Self Care | Payer: 59 | Source: Ambulatory Visit | Attending: Orthopaedic Surgery | Admitting: Orthopaedic Surgery

## 2022-10-25 DIAGNOSIS — Z720 Tobacco use: Secondary | ICD-10-CM | POA: Insufficient documentation

## 2022-10-25 DIAGNOSIS — K219 Gastro-esophageal reflux disease without esophagitis: Secondary | ICD-10-CM | POA: Insufficient documentation

## 2022-10-25 DIAGNOSIS — M1611 Unilateral primary osteoarthritis, right hip: Secondary | ICD-10-CM | POA: Insufficient documentation

## 2022-10-25 DIAGNOSIS — I10 Essential (primary) hypertension: Secondary | ICD-10-CM | POA: Insufficient documentation

## 2022-10-25 DIAGNOSIS — F32A Depression, unspecified: Secondary | ICD-10-CM | POA: Insufficient documentation

## 2022-10-25 DIAGNOSIS — R519 Headache, unspecified: Secondary | ICD-10-CM | POA: Insufficient documentation

## 2022-10-25 LAB — CBC WITH DIFF
BASOPHIL #: <0.1 10*3/uL (ref <=0.20)
BASOPHIL %: 0 %
EOSINOPHIL #: 0.1 10*3/uL (ref <=0.50)
EOSINOPHIL %: 1 %
HCT: 50.9 % (ref 38.9–52.0)
HGB: 16.6 g/dL (ref 13.4–17.5)
IMMATURE GRANULOCYTE #: <0.1 10*3/uL (ref <0.10)
IMMATURE GRANULOCYTE %: 1 % (ref 0.0–1.0)
LYMPHOCYTE #: 2.19 10*3/uL (ref 1.00–4.80)
LYMPHOCYTE %: 27 %
MCH: 30.9 pg (ref 26.0–32.0)
MCHC: 32.6 g/dL (ref 31.0–35.5)
MCV: 94.6 fL (ref 78.0–100.0)
MONOCYTE #: 0.57 10*3/uL (ref 0.20–1.10)
MONOCYTE %: 7 %
MPV: 9.2 fL (ref 8.7–12.5)
NEUTROPHIL #: 5.24 10*3/uL (ref 1.50–7.70)
NEUTROPHIL %: 64 %
PLATELETS: 226 10*3/uL (ref 150–400)
RBC: 5.38 10*6/uL (ref 4.50–6.10)
RDW-CV: 13.6 % (ref 11.5–15.5)
WBC: 8.2 10*3/uL (ref 3.7–11.0)

## 2022-10-25 LAB — BASIC METABOLIC PANEL
ANION GAP: 5 mmol/L (ref 4–13)
BUN/CREA RATIO: 16 (ref 6–22)
BUN: 17 mg/dL (ref 8–25)
CALCIUM: 9.1 mg/dL (ref 8.6–10.3)
CHLORIDE: 106 mmol/L (ref 96–111)
CO2 TOTAL: 29 mmol/L (ref 23–31)
CREATININE: 1.07 mg/dL (ref 0.75–1.35)
ESTIMATED GFR - MALE: 76 mL/min/BSA (ref >=60)
GLUCOSE: 104 mg/dL (ref 65–125)
POTASSIUM: 5 mmol/L (ref 3.5–5.1)
SODIUM: 140 mmol/L (ref 136–145)

## 2022-10-25 LAB — POC BLOOD GLUCOSE (RESULTS): GLUCOSE, POC: 96 mg/dl (ref 80–130)

## 2022-10-25 SURGERY — INJECTION STEROID
Anesthesia: Monitor Anesthesia Care | Site: Hip | Laterality: Right | Wound class: Clean Wound: Uninfected operative wounds in which no inflammation occurred

## 2022-10-25 MED ORDER — METHYLPREDNISOLONE ACETATE 40 MG/ML SUSPENSION FOR INJECTION
INTRAMUSCULAR | Status: AC
Start: 2022-10-25 — End: 2022-10-25
  Filled 2022-10-25: qty 1

## 2022-10-25 MED ORDER — METHYLPREDNISOLONE SOD SUCCINATE 40 MG/ML SOLUTION FOR INJ. WRAPPER
INTRAMUSCULAR | Status: AC
Start: 2022-10-25 — End: 2022-10-25
  Filled 2022-10-25: qty 1

## 2022-10-25 MED ORDER — MIDAZOLAM 1 MG/ML INJECTION WRAPPER
INTRAMUSCULAR | Status: AC
Start: 2022-10-25 — End: 2022-10-25
  Filled 2022-10-25: qty 2

## 2022-10-25 MED ORDER — LACTATED RINGERS INTRAVENOUS SOLUTION
INTRAVENOUS | Status: DC
Start: 2022-10-25 — End: 2022-10-25

## 2022-10-25 MED ORDER — SODIUM CHLORIDE 0.9 % (FLUSH) INJECTION SYRINGE
3.0000 mL | INJECTION | INTRAMUSCULAR | Status: DC | PRN
Start: 2022-10-25 — End: 2022-10-25

## 2022-10-25 MED ORDER — LIDOCAINE HCL 20 MG/ML (2 %) INJECTION SOLUTION
Freq: Once | INTRAMUSCULAR | Status: DC | PRN
Start: 2022-10-25 — End: 2022-10-25
  Administered 2022-10-25: 60 mg via INTRAVENOUS

## 2022-10-25 MED ORDER — KETAMINE 10 MG/ML INJECTION WRAPPER
Freq: Once | INTRAMUSCULAR | Status: DC | PRN
Start: 2022-10-25 — End: 2022-10-25
  Administered 2022-10-25: 15 mg via INTRAVENOUS
  Administered 2022-10-25: 10 mg via INTRAVENOUS

## 2022-10-25 MED ORDER — GLYCOPYRROLATE 0.2 MG/ML INJECTION SOLUTION
INTRAMUSCULAR | Status: AC
Start: 2022-10-25 — End: 2022-10-25
  Filled 2022-10-25: qty 1

## 2022-10-25 MED ORDER — DEXTROSE 50 % IN WATER (D50W) INTRAVENOUS SYRINGE
25.0000 g | INJECTION | INTRAVENOUS | Status: DC | PRN
Start: 2022-10-25 — End: 2022-10-25

## 2022-10-25 MED ORDER — DEXMEDETOMIDINE 4 MCG/ML IV DILUTION
Freq: Once | INTRAMUSCULAR | Status: DC | PRN
Start: 2022-10-25 — End: 2022-10-25
  Administered 2022-10-25: 12 ug via INTRAVENOUS
  Administered 2022-10-25: 8 ug via INTRAVENOUS

## 2022-10-25 MED ORDER — FENTANYL (PF) 50 MCG/ML INJECTION SOLUTION
Freq: Once | INTRAMUSCULAR | Status: DC | PRN
Start: 2022-10-25 — End: 2022-10-25
  Administered 2022-10-25: 100 ug via INTRAVENOUS

## 2022-10-25 MED ORDER — SODIUM CHLORIDE 0.9 % (FLUSH) INJECTION SYRINGE
3.0000 mL | INJECTION | Freq: Three times a day (TID) | INTRAMUSCULAR | Status: DC
Start: 2022-10-25 — End: 2022-10-25

## 2022-10-25 MED ORDER — NALOXONE 1 MG/ML INJECTION SYRINGE
1.0000 mg | INJECTION | INTRAMUSCULAR | Status: DC | PRN
Start: 2022-10-25 — End: 2022-10-25

## 2022-10-25 MED ORDER — INSULIN REGULAR 100 UNIT/ML SUB-Q SSIP VIAL
0.0000 [IU] | INJECTION | Freq: Once | SUBCUTANEOUS | Status: DC | PRN
Start: 2022-10-25 — End: 2022-10-25

## 2022-10-25 MED ORDER — MIDAZOLAM 1 MG/ML INJECTION SOLUTION
Freq: Once | INTRAMUSCULAR | Status: DC | PRN
Start: 2022-10-25 — End: 2022-10-25
  Administered 2022-10-25: 2 mg via INTRAVENOUS

## 2022-10-25 MED ORDER — LACTATED RINGERS INTRAVENOUS SOLUTION
INTRAVENOUS | Status: DC | PRN
Start: 2022-10-25 — End: 2022-10-25

## 2022-10-25 MED ORDER — LACTATED RINGERS INTRAVENOUS SOLUTION
1000.0000 mL | INTRAVENOUS | Status: DC
Start: 2022-10-25 — End: 2022-10-25

## 2022-10-25 MED ORDER — FENTANYL (PF) 50 MCG/ML INJECTION SOLUTION
INTRAMUSCULAR | Status: AC
Start: 2022-10-25 — End: 2022-10-25
  Filled 2022-10-25: qty 2

## 2022-10-25 MED ORDER — METHYLPREDNISOLONE ACETATE 40 MG/ML SUSPENSION FOR INJECTION
Freq: Once | INTRAMUSCULAR | Status: DC | PRN
Start: 2022-10-25 — End: 2022-10-25
  Administered 2022-10-25: 40 mg via INTRAMUSCULAR

## 2022-10-25 MED ORDER — BUPIVACAINE (PF) 0.5 % (5 MG/ML) INJECTION SOLUTION
INTRAMUSCULAR | Status: AC
Start: 2022-10-25 — End: 2022-10-25
  Filled 2022-10-25: qty 10

## 2022-10-25 MED ORDER — BUPIVACAINE (PF) 0.5 % (5 MG/ML) INJECTION SOLUTION
Freq: Once | INTRAMUSCULAR | Status: DC | PRN
Start: 2022-10-25 — End: 2022-10-25
  Administered 2022-10-25: 2 mL

## 2022-10-25 MED ORDER — CEFAZOLIN 2 GRAM/100 ML IN DEXTROSE(ISO-OSMOTIC) INTRAVENOUS PIGGYBACK
2.0000 g | INJECTION | Freq: Once | INTRAVENOUS | Status: DC
Start: 2022-10-25 — End: 2022-10-25

## 2022-10-25 MED ORDER — IPRATROPIUM 0.5 MG-ALBUTEROL 3 MG (2.5 MG BASE)/3 ML NEBULIZATION SOLN
3.0000 mL | INHALATION_SOLUTION | Freq: Once | RESPIRATORY_TRACT | Status: DC | PRN
Start: 2022-10-25 — End: 2022-10-25

## 2022-10-25 MED ORDER — METOCLOPRAMIDE 5 MG/ML INJECTION SOLUTION
10.0000 mg | Freq: Once | INTRAMUSCULAR | Status: DC | PRN
Start: 2022-10-25 — End: 2022-10-25

## 2022-10-25 MED ORDER — RACEPINEPHRINE 2.25 % SOLUTION FOR NEBULIZATION
0.5000 mL | INHALATION_SOLUTION | Freq: Once | RESPIRATORY_TRACT | Status: DC | PRN
Start: 2022-10-25 — End: 2022-10-25

## 2022-10-25 MED ORDER — ONDANSETRON HCL (PF) 4 MG/2 ML INJECTION SOLUTION
4.0000 mg | Freq: Once | INTRAMUSCULAR | Status: DC | PRN
Start: 2022-10-25 — End: 2022-10-25

## 2022-10-25 MED ORDER — KETAMINE 10 MG/ML INJECTION WRAPPER
INTRAMUSCULAR | Status: AC
Start: 2022-10-25 — End: 2022-10-25
  Filled 2022-10-25: qty 5

## 2022-10-25 SURGICAL SUPPLY — 13 items
APPL ISPRP CHG 10.5ML CHLRPRP HI-LT ORNG PREP STRL LF (MED SURG SUPPLIES) ×1 IMPLANT
BANDAGE 3X.75IN SHR NADH PAD PLASTIC ADH STRL LF (WOUND CARE SUPPLY) ×1 IMPLANT
CANNULA NASAL 7FT DIV ETCO2 SAMPLE O2 DEL MALE LL ADULT SLTR EYE 7FT LF (RESPIRATORY/AIRWAY MGMT) ×1 IMPLANT
CONV USE 117716 - SYRINGE 10ML LF  STRL LL MED (MED SURG SUPPLIES) ×1 IMPLANT
CONV USE 131090 - NEEDLE HYPO  21GA 1.5IN MONOJECT MAGELLAN SS BVL ORT SLF LEVEL SHEATH SFSHLD STD LL SYRG GRN STRL LF (MED SURG SUPPLIES) IMPLANT
CONV USE 135507 - NEEDLE HYPO  18GA 1.5IN MAGELLAN SS BVL ORT SLF LEVEL SHEATH SFSHLD STD LL SYRG PNK STRL LF  DISP (MED SURG SUPPLIES) ×1 IMPLANT
GLOVE SURG 7.5 LF  PF BEAD CUF STRL CRM 12IN PROTEXIS PLISPRN THK11.2 MIL (GLOVES AND ACCESSORIES) ×1 IMPLANT
NEEDLE HYPO  18GA 1.5IN MAGELLAN SS BVL ORT SLF LEVEL SHEATH SFSHLD STD LL SYRG PNK STRL LF  DISP (MED SURG SUPPLIES) ×1
NEEDLE HYPO  21GA 1.5IN MONOJECT MAGELLAN SS BVL ORT SLF LEVEL SHEATH SFSHLD STD LL SYRG GRN STRL LF (MED SURG SUPPLIES)
NEEDLE SPINAL PNK 3.5IN 18GA QUINCKE REG WL POLYPROP QUINCKE TIP STRL LF  DISP (MED SURG SUPPLIES) ×1 IMPLANT
NEEDLE SPINAL PNK 6IN 18GA QUINCKE LONG LGTH REG WL POLYPROP QUINCKE TIP STRL LF  DISP (MED SURG SUPPLIES) IMPLANT
SWBSTK MED (MED SURG SUPPLIES) IMPLANT
SYRINGE 10ML LF  STRL LL MED (MED SURG SUPPLIES) ×1

## 2022-10-25 NOTE — Anesthesia Transfer of Care (Signed)
ANESTHESIA TRANSFER OF CARE   Sean Wall is a 67 y.o. 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    HTN (hypertension)     Hyperlipidemia     Ischemic colitis (CMS [place])     Sleep apnea     Tinnitus     Wears glasses       Allergy History as of 10/25/22       PROPOFOL         Noted Status Severity Type Reaction    04/08/21 0848 Gaetano Hawthorne, RN 04/08/21 Active Medium  Rash              LITHIUM         Noted Status Severity Type Reaction    11/12/21 0754 Maurene Capes, MA 11/12/21 Active       Comments: Blurred vision                   I completed my transfer of care / handoff to the receiving personnel during which we discussed:  Access, Airway, All key/critical aspects of case discussed, Analgesia, Antibiotics, Expectation of post procedure, Fluids/Product, Gave opportunity for questions and acknowledgement of understanding, Labs and PMHx      Post Location: PACU                                                             Last OR Temp: Temperature: 36.1 C (97 F)  ABG:  POTASSIUM   Date Value Ref Range Status   10/25/2022 5.0 3.5 - 5.1 mmol/L Final     KETONES   Date Value Ref Range Status   03/28/2022 Not Detected Not Detected mg/dL Final     CALCIUM   Date Value Ref Range Status   10/25/2022 9.1 8.6 - 10.3 mg/dL Final     Comment:     Gadolinium-containing contrast can interfere with calcium measurement.     Calculated P Axis   Date Value Ref Range Status   04/08/2021 80 degrees Final     Calculated R Axis   Date Value Ref Range Status   04/08/2021 55 degrees Final     Calculated T Axis    Date Value Ref Range Status   04/08/2021 28 degrees Final     Airway:* No LDAs found *  Blood pressure (!) 153/96, pulse 88, temperature 36.1 C (97 F), resp. rate 16, height 1.778 m ('5\' 10"'$ ), weight 93 kg (205 lb 1.6 oz), SpO2 95%.

## 2022-10-25 NOTE — H&P (Signed)
[facility]                                                       H&P Update Form    Sean Wall, Sean Wall Anan Hoard, 67 y.o. male  Date of Admission:  10/25/2022  Date of Birth:  [birth year]    10/25/2022    Pre-Surgical H & P updated the day of the procedure.    H&P completed within 30 days of surgical procedure was performed by Dr. Thurmond Butts on 10/17/22 and has been reviewed.    Gwen Pounds, PA-C  Department of Orthopaedic Surgery

## 2022-10-25 NOTE — Nurses Notes (Signed)
Patient has ate and drank. Denies pain or nausea. Resp even and unlabored. Band-Aid remains to right hip. Patient given discharge instructions and he voiced understanding. Patient ambulated to the car to go home with wife.

## 2022-10-25 NOTE — Anesthesia Postprocedure Evaluation (Signed)
Anesthesia Post Op Evaluation    Patient: Sean Wall  Procedure(s):  FLUOROGUIDED CORTICOSTEROID INJECTION RIGHT HIP    Last Vitals:Temperature: 36.4 C (97.5 F) (10/25/22 1124)  Heart Rate: 59 (10/25/22 1124)  BP (Non-Invasive): 113/80 (10/25/22 1124)  Respiratory Rate: 18 (10/25/22 1124)  SpO2: 98 % (10/25/22 1124)    No notable events documented.    Patient is sufficiently recovered from the effects of anesthesia to participate in the evaluation and has returned to their pre-procedure level.  Patient location during evaluation: PACU       Patient participation: complete - patient participated  Level of consciousness: awake and alert and responsive to verbal stimuli    Pain management: adequate  Airway patency: patent    Anesthetic complications: no  Cardiovascular status: acceptable  Respiratory status: acceptable  Hydration status: acceptable  Patient post-procedure temperature: Pt Normothermic   PONV Status: Absent

## 2022-10-25 NOTE — Nurses Notes (Signed)
Received patient to sdc. Patient alert and oriented. Resp even and unlabored. Denies pain or nausea. Band-Aid to right hip dry and intact. Wife present at bedside. Call light within reach.

## 2022-10-25 NOTE — OR Surgeon (Signed)
[facility]  Orthopaedic Operative Report    Patient Name: Sean Wall  MRN: [id number]  Date: 10/25/2022  Birth: 21-May-1956    All elements must be documented.    Pre-Operative Diagnosis:  Right hip osteoarthritis  Post-Operative Diagnosis: same  Procedure(s)/Description:  Procedure(s):    Right hip fluoroscopic guided corticosteroid injection    Surgeon: Blane Ohara, MD  Assistant(s): Gwen Pounds PA-C - please note the assistance was necessary as there are no available qualified residents for positioning opening closure retraction    Implants:  None    Specimens:  None  Anesthesia Type: General     Estimated Blood Loss:  None      Complications (not routinely expected or not inherent to difficulty/nature of procedure): None     Findings:  None    Indications for procedure:  This is a 67 year old gentleman with chronic back and hip pain.  He has some early changes of osteoarthritis in his right hip.  He was not ready for hip replacement.  He was interested in a corticosteroid injection to his hip.  Risks benefits and alternatives of surgery were explained the patient.  Risks include were not limited to damage to local structures, anesthesia risk, pain, etc..  Patient accepts these risks and wants to move forward with the procedure.     Description of the Procedure:  Patient was met in the preoperative holding area.  Right lower extremity was marked as the operative site.  Consent was reviewed.  Patient was taken back to the OR and underwent sedation monitored by anesthesia.  Standard time-out was performed indicating correct patient procedure and side.  Skin was cleansed with ChloraPrep.  Using 18 gauge spinal needle this was placed in the anterior lateral aspect of the hip.  Fluoroscopic guidance was utilized to confirm needle placement.  Once I was into the hip joint 40 of Depo-Medrol and 2 cc of 0.5% bupivacaine without epinephrine was injected into the hip joint.  Contents were evacuated freely.   Hemostasis was obtained with a Band-Aid.  Patient tolerated procedure well without acute complications.    Post operative plan:  Patient will be discharged home.  He will be weightbear as tolerated.  We will follow up in clinic in a couple weeks for a clinic check.  Ambulatory for DVT prophylaxis    Blane Ohara, MD  Department of Orthopaedics

## 2022-10-25 NOTE — Progress Notes (Signed)
Ortho Post-op/Discharge Note  10/25/2022    Ciel Arrendondo Ringel  1956-08-05  [id number]    S/p right hip corticosteroid injection under fluoroscopic guidance    SUBJECTIVE: Patient stable in PACU, pain controlled    OBJECTIVE:   Filed Vitals:    10/25/22 0729   BP: 123/84   Pulse: 56   Resp: 16   Temp: 36.1 C (97 F)   SpO2: 97%     Gen: NAD  Lungs: non-labored breathing  CV: pulses regular rate  Abd: non-distended    Right lower extremity  +EHL/FHL/ADF/APF  SILT all peripheral nerve distributions  BCR foot, DP and PT pulses palpable    ASSESSMENT: 67 y.o. yo male s/p right hip corticosteroid injection    PLAN:   Patient be discharged home.  He was ambulatory for DVT prophylaxis.  He is weightbear as tolerated.  He will follow up in clinic in 2 weeks for a clinic check    Blane Ohara, MD  Department of Orthopaedics

## 2022-10-25 NOTE — Anesthesia Preprocedure Evaluation (Signed)
ANESTHESIA PRE-OP EVALUATION  Planned Procedure: FLUOROGUIDED CORTICOSTEROID INJECTION RIGHT HIP (Right: Hip)  Review of Systems     anesthesia history negative     patient summary reviewed          Pulmonary   current smoker (Occ. cigar),  no COPD, no asthma and no shortness of breath   Cardiovascular    Hypertension ,No past MI and no angina,  Exercise Tolerance: > or = 4 METS        GI/Hepatic/Renal    GERD and well controlled        Endo/Other   neg endo/other ROS,       Neuro/Psych/MS    headaches, depression     Cancer                        Physical Assessment      Airway       Mallampati: I    TM distance: >3 FB    Neck ROM: full  Mouth Opening: good.            Dental       Dentition intact             Pulmonary    Breath sounds clear to auscultation       Cardiovascular    Rhythm: regular  Rate: Normal       Other findings              Plan  ASA 2     Planned anesthesia type: MAC           PONV Plan:  I plan to administer pharmcologic prophalaxis antiemetics              Intravenous induction     Anesthesia issues/risks discussed are: PONV, Dental Injuries, Post-op Pain Management, Cardiac Events/MI, Aspiration, Intraoperative Awareness/ Recall, Stroke and Blood Loss.  Anesthetic plan and risks discussed with patient  signed consent obtained          Patient's NPO status is appropriate for Anesthesia.           Plan discussed with CRNA.

## 2022-10-25 NOTE — OR PostOp (Signed)
Sean Wall Arrived in PACU at 1011  Rousable and responding on arrival  Breaths regular and unlabored, lips and nailbeds pink  Skin warm and dry to touch  Connected to monitors  Infusing LR in right arm, site normal  Wound dressing dry and intact  1030 - fully awake and talking, moves toes, denies pain

## 2022-11-04 ENCOUNTER — Encounter (INDEPENDENT_AMBULATORY_CARE_PROVIDER_SITE_OTHER): Payer: Self-pay

## 2022-11-04 ENCOUNTER — Ambulatory Visit (INDEPENDENT_AMBULATORY_CARE_PROVIDER_SITE_OTHER): Payer: 59

## 2022-11-04 ENCOUNTER — Other Ambulatory Visit: Payer: Self-pay

## 2022-11-04 VITALS — BP 126/86 | Ht 70.0 in | Wt 205.0 lb

## 2022-11-04 DIAGNOSIS — M160 Bilateral primary osteoarthritis of hip: Secondary | ICD-10-CM

## 2022-11-04 NOTE — Progress Notes (Signed)
[facility]  [address]  [place] [address]   Progress Note    Patient: Sean Wall   MRN: [id number]  Date: 11/04/2022    Date of Injury/Surgery:  10/25/2022 right hip corticosteroid injection under fluoro      Subjective:   [name] is a 67 y.o. male who presents for follow up of the above stated procedure.  He is 10 days out.  He did not get significant relief from the injection.  He says he does not have significant groin pain but rather locates most of his pain to his lateral hip.  He also states that the pain and irritation is not his primary complaint but rather his externally rotated foot.  He says that so far it does not cause a tripping hazard but that it is getting worse through time in his hoping to returned to an anatomic alignment.      Review of Systems: Outside of those noted above all other systems were reviewed and are negative      Physical Exam:  There were no vitals taken for this visit.        This is a well appearing 67 y.o. male in no acute distress in the clinic today    Extremity exam:  Right lower extremity shows a slightly externally rotated foot.  No tenderness to palpation throughout groin or lateral hip.  Negative FABER and FADIR test, passive flexion of hip does not elicit pain.  Neurovascularly intact distally.      Imaging:  Not applicable     Assessment:   ENCOUNTER DIAGNOSES     ICD-10-CM   1. Osteoarthritis, hip, bilateral  M16.0       Plan:  Radiographically the patient has bilateral hip arthritis but I think the failure of the injection to relieve his discomfort rules out osteoarthritis as his primary pain generator.  I referred to [facility] to attempt to return his anatomic gait with the hope of relieving some of his hip pain.  He is agreeable to plan.  Follow up p.r.n.      Gwen Pounds, PA-C  11/04/2022, 08:12    I saw this patient independently with Dr. Thurmond Butts available by  phone.

## 2022-11-11 ENCOUNTER — Encounter (HOSPITAL_BASED_OUTPATIENT_CLINIC_OR_DEPARTMENT_OTHER): Payer: Self-pay | Admitting: Family

## 2022-11-11 ENCOUNTER — Other Ambulatory Visit: Payer: Self-pay

## 2022-11-11 ENCOUNTER — Ambulatory Visit: Payer: 59 | Attending: Family | Admitting: Family

## 2022-11-11 VITALS — BP 118/78 | HR 59 | Temp 97.7°F | Resp 16 | Ht 70.0 in | Wt 215.4 lb

## 2022-11-11 DIAGNOSIS — M4726 Other spondylosis with radiculopathy, lumbar region: Secondary | ICD-10-CM

## 2022-11-11 DIAGNOSIS — M47816 Spondylosis without myelopathy or radiculopathy, lumbar region: Secondary | ICD-10-CM | POA: Insufficient documentation

## 2022-11-11 DIAGNOSIS — M5416 Radiculopathy, lumbar region: Secondary | ICD-10-CM | POA: Insufficient documentation

## 2022-11-11 DIAGNOSIS — M545 Low back pain, unspecified: Secondary | ICD-10-CM | POA: Insufficient documentation

## 2022-11-11 DIAGNOSIS — Z981 Arthrodesis status: Secondary | ICD-10-CM

## 2022-11-11 NOTE — Nursing Note (Signed)
Pain Same  Last seen on 10/12/22 (09/12/22) for procedure Cervical (Lumbar) Epidural Steroid Injection   65 (0) % of relief    Lowest level of pain 7 /10  Highest level of pain 8 /10  Murvin Donning, MA        Pain and Function:     [place] Pain Rating Scale     On a scale of 0-10, during the past 24 hours, pain has interfered with you usual activity:   7    On a scale of 0-10, during the past 24 hours, pain has interfered with your sleep:  2    On a scale of 0-10, during the past 24 hours, pain has affected your mood:   0    On a scale of 0-10, during the past 24 hours, pain has contributed to your stress:   2    On a scale of 0-10, what is your overall pain Rating:  7

## 2022-11-11 NOTE — Progress Notes (Signed)
[facility]                              Pain Management, [facility]  [place] [address]  [phone]         Progress Note      Date of Service: 11/11/2022  Name: Sean Wall  Date of Birth: [birth year]    Obtained History From: Patient     CHIEF COMPLAINT:  Follow-up after injection therapy.    HPI: Sean Wall is a 67 y.o. male who presents to the clinic today for follow-up after a left LESI L2-L3 on 09/12/2022 and a CESI C7-T1 on 10/12/2022 with Dr. Rip Harbour.  He reports no pain relief following the LESI.  He reports 65% ongoing pain relief following the CESI.  He again is reporting low back pain that radiates to the bilateral lower extremities as well as tingling.  He again reports pain in his neck but again reports that his low back symptoms are the worst at this time.  He does have a history of lumbar fusion.  He is in agreement to see Neurosurgery for further evaluation of surgical intervention due to his lack of pain relief with injection therapy.  He does describe his symptoms as moderate to severe and interfering with daily activities and is overall quality of life.    As stated in previous documentation the patient has participated in conservative therapies including physical therapy/home exercises.  He has used heat and ice p.r.n. for pain relief.  He has also taken gabapentin, hydrocodone, oral steroids and Flexeril for pain.  Despite his participation in conservative therapies his pain has not been adequately relieved and he has not had an improvement in functioning.    Words:  Aching, throbbing, stabbing, stinging and tingling  Intensity (current): 8/10  Intensity (past 24 hours averaged) 8/10  Location:  Low back, lower extremities, neck  Duration:  Constant  Aggravated by:  Walking, lifting, bending /Alleviated by:  Rest, lying, medication-hydrocodone  Associated Symptoms:  The patient denies any new onset numbness.  He does  report new onset weakness.  Patient Pain Control Goal:  0/10    Procedures:   2018-L3-5 lumbar fusion in [place] by Dr. Vertell Limber  06/15/22-right CESI C7-T1-50% pain relief for 2 months  09/12/22-LESI L2-L3-no pain relief  10/12/22-CESI C7-T1-65% ongoing pain relief    HOME MEDICATION REGIMEN: atorvastatin (LIPITOR) 40 mg Oral Tablet, Take 1 Tablet (40 mg total) by mouth Every evening  azelastine-fluticasone 137-50 mcg/spray Nasal Spray, Non-Aerosol, Administer 2 Sprays into affected nostril(s) Every night as needed Only uses if having a issue  bisacodyL (DULCOLAX) 5 mg Oral Tablet, Delayed Release (E.C.), Take 2 Tablets (10 mg total) by mouth Every 24 hours as needed for Constipation for up to 1 dose To be taken in the evening  buPROPion (WELLBUTRIN XL) 300 mg extended release 24 hr tablet, Take 1 Tablet (300 mg total) by mouth Once a day  cetirizine (ZYRTEC) 10 mg Oral Tablet, Take 1 Tablet (10 mg total) by mouth Every night as needed for Allergies  cyanocobalamin (VITAMIN B12) 1,000 mcg/mL Injection Solution, 1 mL (1,000 mcg total) Every 30 days Takes on the 21st  cyclobenzaprine (FLEXERIL) 10 mg Oral Tablet, Take 1 Tablet (10 mg total) by mouth Three times a day as needed Usually take at one efery  HS  dextroamphetamine-amphetamine (ADDERALL) 10 mg Oral  Tablet, Take 1 Tablet (10 mg total) by mouth Once per day as needed for Other  ferrous sulfate (FERATAB) 324 mg (65 mg iron) Oral Tablet, Delayed Release (E.C.), Take 1 Tablet (324 mg total) by mouth Every other day On Monday, Wednesday, friday (Patient not taking: Reported on 10/19/2022)  finasteride (PROSCAR) 5 mg Oral Tablet, Take 1 Tablet (5 mg total) by mouth Once a day  gabapentin (NEURONTIN) 300 mg Oral Capsule, Take 1 Capsule (300 mg total) by mouth Three times a day Takes bid  HERBAL DRUGS ORAL, Take 700 mg by mouth Once a day PEPPERMINT LEAF  HYDROcodone-acetaminophen (NORCO) 5-325 mg Oral Tablet, Take 1 Tablet by mouth Every 8 hours as needed for  Pain  HYDROcodone-acetaminophen (NORCO) 5-325 mg Oral Tablet, Take 1-2 Tablets by mouth Every 6 hours as needed for Pain  Ibuprofen (MOTRIN) 600 mg Oral Tablet, Take 1 Tablet (600 mg total) by mouth Four times a day as needed for Pain  lactobacillus comb no.10 (PROBIOTIC) 20 billion cell Oral Capsule, Take 1 Capsule by mouth Once a day  melatonin 3 mg Oral Capsule, Take 2 Capsules (6 mg total) by mouth Every night  metoprolol tartrate (LOPRESSOR) 25 mg Oral Tablet, Take 1 Tablet (25 mg total) by mouth Every night  pantoprazole (PROTONIX) 20 mg Oral Tablet, Delayed Release (E.C.), Take 1 Tablet (20 mg total) by mouth Twice daily  predniSONE (DELTASONE) 10 mg Oral Tablet, 4 TABLETS QAM X 3 DAYS, THEN 3 TABLETS QAM X 3 DAYS, THEN 2 TABLETS QAM X 3 DAYS, THEN 1 TABLET QAM X 3 DAYS (Patient not taking: Reported on 10/25/2022)  QUEtiapine (SEROQUEL) 50 mg Oral Tablet, Take 1 Tablet (50 mg total) by mouth Every night  simethicone (MYLICON) 80 mg Oral Tablet, Chewable, Chew 1 Tablet (80 mg total) Every 6 hours as needed  sumatriptan succinate (IMITREX) 100 mg Oral Tablet, Take 1 Tablet (100 mg total) by mouth Once, as needed for Migraine May repeat in 2 hours in needed  tadalafil (CIALIS) 10 mg Oral Tablet, Take 1 Tablet (10 mg total) by mouth Every 24 hours as needed  tadalafil (CIALIS) 20 mg Oral Tablet, Take 1 Tablet (20 mg total) by mouth Every 24 hours as needed  tamsulosin (FLOMAX) 0.4 mg Oral Capsule, Take 1 Capsule (0.4 mg total) by mouth Every night  Testosterone 1 % (25 mg/2.5gram) Transdermal Gel in Packet, Place 2 Each on the skin Once a day 2 pumps each shoulder daily am    No facility-administered medications prior to visit.      ROS:   Reviewed/ Summarized records and/or obtained History from patient  Other than ROS in the HPI, all other systems were negative.    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 Vertell Limber. Right 2018 Dr Tommas Olp CYST REMOVAL Right 1990    right wrist and right ankle. [employer]    HX LUMBAR FUSION  2018    hardware in place--Dr. Tommas Olp NOSE/SINUS SURGERY  2005    deviated septum repair. Dr. Venida Jarvis SHOULDER SURGERY Bilateral     5 surgeries on right and 3 on the left    HX TURP      HX VASECTOMY      Dr. Steffanie Dunn HERNIA REPAIR Right 1991    [employer]    LASIK Bilateral 2004         Social History     Tobacco Use    Smoking status: Light Smoker     Types: Cigars    Smokeless tobacco: Never    Tobacco comments:     OCCASIONAL SMOKING-1 CIGAR EVERY 2 WEEKS   Vaping Use    Vaping status: Never Used   Substance Use Topics    Alcohol use: Yes     Alcohol/week: 21.0 standard drinks of alcohol     Types: 21 Cans of beer per week     Comment: 2 drinks daily    Drug use: Never     Family Medical History:       Problem Relation (Age of Onset)    No Known Problems Mother, Father, Sister, Brother, Half-Sister, Half-Brother, Maternal Aunt, Maternal Uncle, Paternal 102, Paternal Uncle, Maternal Grandmother, Maternal Grandfather, Paternal Grandmother, Paternal Grandfather, Daughter, Son, Other                Family History:    I have reviewed and updated as appropriate the past medical, family and social history.         VITALS:     Temperature: 36.5 C (97.7 F) Heart Rate: 59 BP (Non-Invasive): 118/78   Respiratory Rate: 16 SpO2: 96 %         PHYSICAL EXAMINATION: (MUST comment on all "Abnormal" findings)  General: appears in good health, comfortable and no distress  Eyes: Conjunctiva clear.  HENT:Head atraumatic and normocephalic  Neck: supple, symmetrical, trachea midline  Musculoskeletal:   Gait and Station:Normal  Sensation: Abnormal:  Sensory deficit  Coordination: Normal  Musculoskeletal Tenderness: positive-tenderness to the cervical and lumbar paraspinal muscles  Extremities: No cyanosis or deformity  Skin: Skin warm and dry  Neurologic: Alert and oriented x3  Psychiatric: Normal affect, behavior, memory, thought content, judgement, and speech.    Radiology Tests Ordered/ Reviewed:   Reviewed:   Recent Results (from the past [id number] hour(s))   MRI SPINE LUMBOSACRAL W/WO CONTRAST    Collection Time: 05/04/22  1:07 PM    Narrative    History: Low back pain.    TECHNIQUE: Multiplanar multi sequence MR imaging lumbar spine is performed with and without the use of intravenous contrast.    There are postoperative changes of L3 and L4 laminectomies with posterior stabilization from L3 through L5 causing transpedicular screws, spinal rods, and intervertebral disc prosthesis. Spinal hardware does result in magnetic sensibility artifact limiting the exam. Alignment  is near-anatomic with slight straightening of the lumbar lordosis. Type I and type II degenerative endplate changes are seen with mild endplate invagination at L3-4 and L4-5. There is no fracture.    At the L1-2 level, there is narrowing and desiccation of intervertebral disc. There is generalized disc bulging and early facet joint arthrosis. This results in moderate right and mild left neural foraminal encroachment. There is effacement of the thecal sac with no significant spinal stenosis.    At the L2-3 level, there is narrowing and desiccation of intervertebral  disc. There is a large generalized disc bulge with prominent posterior osteophyte formation. This combines with facet joint arthrosis and prominent ligamentum flavum to cause moderate right greater than left neural foraminal encroachment and moderate spinal canal stenosis. There is suspected impingement on the left L2 nerve root laterally.    At the L3-4 and L4-5 levels, the neural foramen appear patent bilaterally. There is good decompression of the thecal sac with no significant spinal stenosis.    At the L5-S1 level, there is mild disc bulging and posterior osteophyte formation. There is prominent facet joint arthrosis. This results in a moderate left and mild right neural foraminal encroachment. Of note, the thecal sac is extremely small at this level      Impression    1. Postoperative changes of posterior stabilization from L3 through L5 as described above. Alignment is near-anatomic with slight straightening of the lumbar lordosis.  2. 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  3. The findings are most pronounced at L2-3 where there is moderate spinal canal stenosis and bilateral neural foraminal encroachment secondary to disc bulging, osteophyte formation, facet joint arthrosis, and prominent ligamentum flavum. There is suspected impingement on the left L2 nerve root laterally at this level.      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      FINDINGS: There is normal alignment of cervical spine. No fracture or dislocation is seen,  with vertebral body heights preserved. Bone marrow signal is within normal limits. Focal fat density of the C2 vertebral body is likely a small hemangioma.    At C2-C3 there is no spinal canal or neural foraminal stenosis.    At C3-C4 facet arthropathy results in mild bilateral neural foraminal narrowing. No spinal canal narrowing is seen.    At C4-C5 uncovertebral and facet arthropathy results in moderate left and mild right neural foraminal narrowing. A central left paracentral disc herniation results in mild spinal canal narrowing.    At C5-C6 there is central and paracentral zone disc herniation resulting in mild to moderate spinal canal narrowing. There is moderate bilateral neural foraminal narrowing due to uncovertebral and facet osteoarthropathy.    At C6-C7 there is moderate bilateral neural foraminal narrowing. No spinal canal narrowing is seen.    At C7-T1 there is no spinal canal narrowing. Mild bilateral neural foraminal narrowing is seen.    No abnormal signal is seen in the spinal cord. The soft tissues of the neck  are unremarkable.      Impression    Mild to moderate degenerative changes of the cervical spine, with mild to moderate spinal canal narrowing at C5-C6 as well as moderate neural foraminal narrowing on the left at C4-C5 and bilaterally at C5-C6 and C6-C7.            Radiologist location ID: [id number]         ASSESSMENT:   Lumbar radiculopathy  Lumbar spondylosis  History of lumbar fusion  Lumbar radiculopathy  Lumbar spondylosis  Lumbar disc herniation  Chronic neck pain  Chronic low back pain    PLAN/RECOMMENDATIONS:   Ordered new MRI of the lumbosacral spine and placed a referral to Neurosurgery per patient request.  The patient is advised to call with any further questions, concerns or worsening pain.    Future considerations:  May consider repeat CESI    Our impression, recommendation, and plan from today's visit was reviewed, explained and discussed in detail with the patient in the  office. All of the patients questions were answered. Patient verbalized and in agreement with our plan.    Patient is seen independently.    Illene Regulus, APRN, FNP-C  11/11/2022, 08:43

## 2022-11-24 ENCOUNTER — Other Ambulatory Visit: Payer: Self-pay

## 2022-11-24 ENCOUNTER — Inpatient Hospital Stay (HOSPITAL_BASED_OUTPATIENT_CLINIC_OR_DEPARTMENT_OTHER): Admission: RE | Admit: 2022-11-24 | Payer: 59 | Source: Ambulatory Visit

## 2022-11-26 ENCOUNTER — Other Ambulatory Visit: Payer: Self-pay

## 2022-11-26 ENCOUNTER — Inpatient Hospital Stay
Admission: RE | Admit: 2022-11-26 | Discharge: 2022-11-26 | Disposition: A | Discharge status: Home / Self Care | Payer: 59 | Source: Ambulatory Visit | Attending: Family | Admitting: Family

## 2022-11-26 DIAGNOSIS — M47816 Spondylosis without myelopathy or radiculopathy, lumbar region: Secondary | ICD-10-CM | POA: Insufficient documentation

## 2022-11-26 DIAGNOSIS — Z981 Arthrodesis status: Secondary | ICD-10-CM | POA: Insufficient documentation

## 2022-11-26 DIAGNOSIS — M5416 Radiculopathy, lumbar region: Secondary | ICD-10-CM | POA: Insufficient documentation

## 2022-11-26 DIAGNOSIS — M545 Low back pain, unspecified: Secondary | ICD-10-CM | POA: Insufficient documentation

## 2022-11-28 ENCOUNTER — Ambulatory Visit (INDEPENDENT_AMBULATORY_CARE_PROVIDER_SITE_OTHER): Payer: 59 | Admitting: Rehabilitative and Restorative Service Providers"

## 2022-11-28 ENCOUNTER — Encounter (INDEPENDENT_AMBULATORY_CARE_PROVIDER_SITE_OTHER): Payer: Self-pay

## 2022-11-28 ENCOUNTER — Other Ambulatory Visit: Payer: Self-pay

## 2022-11-28 DIAGNOSIS — M160 Bilateral primary osteoarthritis of hip: Secondary | ICD-10-CM

## 2022-11-28 NOTE — Progress Notes (Signed)
Physical Therapy, [facility]  [address]  [place] [address]  [phone]  Physical Therapy Progress Note       Patient Name: Sean Wall  Date of Birth: 08-23-1955  Height:     Weight:         Subjective & Objective:   Subjective Evaluation    History of Present Illness  Mechanism of injury: 10/25/2022 right hip corticosteroid injection under fluoro. He did not get significant relief from the injection.  Pt reports burning, stinging pain in Left lateral thigh and has extended to R thigh. Pt reports pain into posterior L knee that was stinging pain. Pt has paresthesia in lateral thigh and has extended to R thigh.  He also states that the pain and irritation is not his primary complaint but rather his externally rotated foot.  He says that so far it does not cause a tripping hazard but that it is getting worse through time in his hoping to returned to an anatomic alignment.   Pt has history of lumbar surgery in 2018 and has reported L5 pedicle screws slightly loosened. Reports slight weakness in LLE greater than RLE.   Pt has pain w/ lumbar flexion.   Pt's primary complaint is lumbar pain and radiculopathy.   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     PLOF: retired from [employer], retired as Stage manager, independent w/ all activities.     MRI SPINE LUMBOSACRAL WO CONTRAST performed on 11/26/2022 10:20 AM.     REASON FOR EXAM:  M54.16: Lumbar radiculopathy  Z98.1: History of lumbar fusion  M47.816: Lumbar spondylosis  M54.50: Low back pain     TECHNIQUE: Magnetic resonance imaging of the lumbar spine performed in the sagittal and transaxial planes. STIR sequence performed sagittally.     COMPARISON: MRI lumbar spine 05/04/2022     FINDINGS:  Postsurgical changes are noted at the L3-4 and L4-5 intervertebral disc space levels. There is been placement of 2 screws at the L3, L4 and L5 levels. The central canal patent at these levels.     There is thinning  of the L2-3 intervertebral disc space with posterior disc protrusion and facet hypertrophy. These findings result in a moderate degree of central canal stenosis and bilateral neural foraminal stenosis.     There is narrowing of the L1-2 intervertebral disc space with posterior disc protrusion and facet hypertrophy which results in very mild degree of central canal stenosis and bilateral neural foraminal stenosis.     The conus is at the T12 level.     At the L5-S1 level, there is moderate to severe facet hypertrophy. 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 There does appear to be mild to moderate neural foraminal stenosis on the left.     IMPRESSION:  1. Postsurgical changes are noted at L3-4 and L4-5 levels. The central canal patent.  2. 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 The findings are similar prior study and or slightly progressed.  3. At the L5-S1 level, there is moderate to severe facet hypertrophy and hypertrophy ligamentum flavum. There is mild to moderate neural foraminal stenosis on the left associated facet hypertrophy. There does appear to be lipomatosis in extrinsic compression upon the thecal sac at the L5-S1 level. Question clinical significance.        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   HTN (hypertension)     Hyperlipidemia     Ischemic colitis (CMS HCC)     Sleep apnea     Tinnitus             Past Surgical History:  Procedure Laterality Date   C7-T1 CERVICAL EPIDURAL STEROID INJECTION N/A 10/12/2022    Performed by Lisbeth Renshaw, DO at [facility] OR OPS   C7-T1  FAVOR RIGHT CERVICAL EPIDURAL STEROID INJECTION N/A 06/15/2022    Performed by Lisbeth Renshaw, DO at [facility] OR OPS   COLONOSCOPY   04/09/2021   COLONOSCOPY WITH BIOPSY N/A 04/09/2021    Performed by Brayton Mars, MD at [facility] OR ENDO   COLONOSCOPY WITH POLYPECTOMY N/A 03/15/2022    Performed by Brayton Mars, MD at [facility] OR ENDO   CPAP   2005    deviated septum repair. Dr. Annalee Genta   CYSTOSCOPY WITH CYSTOMETROGRAM AND URODYNAMICS N/A 12/24/2021    Performed by Morley, [place] Edwin, MD at [facility] OR 2 WEST   ELBOW SURGERY Left     HX APPENDECTOMY   1978   HX CARPAL TUNNEL RELEASE Bilateral      Left 2017 Dr. Venetia Maxon. Right 2018 Dr Aileen Pilot CYST REMOVAL Right 1990    right wrist and right ankle. [employer]   HX LUMBAR FUSION   2018    hardware in place--Dr. Aileen Pilot SHOULDER SURGERY Bilateral      5 surgeries on right and 3 on the left   HX TURP       HX VASECTOMY        Dr. Sharyn Blitz HERNIA REPAIR Right 1991    [employer]   LASIK Bilateral 2004   LEFT L2-3 LUMBAR EPIDURAL STEROID INJECTION Left 09/12/2022    Performed by Lisbeth Renshaw, DO at [facility] OR OPS          Quality of life: good    Pain  Location: B lateral thighs  Quality: needle-like, sharp and burning  Relieving factors: relaxation, rest and change in position  Aggravating factors: movement  Progression: worsening    Treatments  Current treatment: physical therapy  Patient Goals  Patient goals for therapy: decreased pain, improved balance, increased motion, increased strength, independence with ADLs/IADLs and return to sport/leisure activities            Assessment:   Objective     Observations     Additional Observation Details  Left hip log roll negative  L hip circumduction negative  L knee crepitus noted.   History R knee pain w/ meniscus tear treated w/ injections.   R hip circumduction negative  Right hip log roll negative.   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         Strength/Myotome Testing     Additional Strength Details  STRENGTH RIGHT, LEFT  Hip flexion 4/5, 4+/5  abduction 5/5, 5/5  adduction 5/5,5/5  knee extension 5/5, 5/5  knee flexion 5/5, 5/5  ankle DF 5/5, 5/5  great toe extension 5/5, 5/5    Standing posture w/ flattened lumbar lordosis and slight hip flexion and knee flexion in standing. R LE ER in standing.     Ambulation     Ambulation: Level Surfaces     Additional Level Surfaces Ambulation Details  R LE ER during gait and decreased stance time on RLE       Plan:   Assessment & Plan     Assessment  Impairments: abnormal coordination, abnormal gait, abnormal muscle firing, abnormal muscle tone, abnormal or restricted ROM, activity intolerance, impaired balance, impaired physical strength, lacks appropriate home exercise program and pain with function  Assessment details: Patient referred to physical therapy for bilateral hip pain.  Patient recently had a right hip fluoro guided injection.  Patient denies significant improvement in symptoms.  Patient also had recent MRI with results reviewed this date.  Patient has not been contacted by referring physician regarding results.  PT will wait for recommendations from physician.  Patient feels the symptoms are coming from his back primarily.  Patient does not demonstrate any significant pain or catching bilateral hips during evaluation this date.  Discussed POC and goals with patient.  PT will focus on lumbosacral and hip stabilization.  Patient verbalized understanding.  Prognosis: good    Goals  LTG's 12 weeks: 1. Pt will report pain 0-3/10 in B hips and lumbar spine. 2. Pt will improve BLE strength to 5/5 for transfers. 3. Pt will improve activity tolerance to 45 minutes. 4. Pt will demo independence w/ HEP. 5. Pt will ambulate w/ normal gait pattern. 6. Pt will ascend/descend 12 stairs w/ B handrails w/o pain    Plan  Therapy options: will be seen for skilled physical therapy  services  Planned modality interventions: cryotherapy, electrical stimulation/Russian stimulation, TENS, thermotherapy (hydrocollator packs) and ultrasound  Planned therapy interventions: ADL retraining, balance/weight-bearing training, bed mobility training, body mechanics training, fine motor coordination training, flexibility, functional ROM exercises, gait training, home exercise program, joint mobilization, manual therapy, motor coordination training, muscle pump exercises, neuromuscular re-education, postural training, soft tissue mobilization, spinal/joint mobilization, strengthening, stretching and transfer training  Frequency: 2x week  Duration in weeks: 12  Treatment plan discussed with: patient        Continue to follow patient according to established plan of care.  The risks/benefits of therapy have been discussed with the patient/caregiver and he/she is in agreement with the established plan of care.       Therapist:   Glade Nurse, PT, DPT  11/28/2022, 09:35     Start Time: 6962  End Time: 1013  Total Treatment Time: PT evaluation 38 minutes  Physical Therapy Evaluation

## 2022-11-29 ENCOUNTER — Encounter (HOSPITAL_BASED_OUTPATIENT_CLINIC_OR_DEPARTMENT_OTHER): Payer: Self-pay | Admitting: Family

## 2022-12-05 ENCOUNTER — Other Ambulatory Visit: Payer: Self-pay

## 2022-12-05 ENCOUNTER — Ambulatory Visit (INDEPENDENT_AMBULATORY_CARE_PROVIDER_SITE_OTHER): Payer: 59 | Admitting: Rehabilitative and Restorative Service Providers"

## 2022-12-05 DIAGNOSIS — M160 Bilateral primary osteoarthritis of hip: Secondary | ICD-10-CM

## 2022-12-05 NOTE — Progress Notes (Signed)
Physical Therapy, [facility]  [address]  [place] [address]  [phone]  Physical Therapy Progress Note       Patient Name: Sean Wall  Date of Birth: 25-Mar-1956  Height:     Weight:     Room/Bed: Room/bed info not found  [facility]or: [facility] / Plan: [place] [employer] / Product Type: Managed Care /       Subjective & Objective:   Pt reports pain in L posterior hip on entrance. Reports feeling okay for a couple days, but has had increased pain today.     See flowsheet for details. PPT and lumbar rotations to promote gentle ROM and mobility. Manual therapy w/ Graston Technique utilizing GT5 and GT3 to left paraspinals extending into posterolateral L hip w/ sweeping and brushing. Sidelying clamshells to promote ROM and mobility in L hip. Supine and seated exercises to promote hip and lumbar ROM and strength.     Assessment:    Pt performs all exercises w/ minimal cues. Pt reports pain and tightness in L posterolateral hip during stretching into hip flexion and adduction. Pt reports previously performing neural flossing during PT. Pt does not have any increased symptoms elicited during neural flossing. Pt does not have any significant redness during manual techniques. Pt will return to PT at scheduled time for continued PT intervention and assess symptoms following today's treatment. Discussed using TENs unit on left lumbar spine and hip. Pt verbalizes understanding of electrode placement.     Plan:     Continue to follow patient according to established plan of care.  The risks/benefits of therapy have been discussed with the patient/caregiver and he/she is in agreement with the established plan of care.       Therapist:   Glade Nurse, PT, DPT  12/05/2022, 08:01    Start Time: 6578  End Time: 4696  Total Treatment Time: 44 minutes, therapeutic exercise 29 minutes, 2 units, manual therapy 15 minutes, 1 unit   Physical Therapy Visit

## 2022-12-07 ENCOUNTER — Other Ambulatory Visit: Payer: Self-pay

## 2022-12-07 ENCOUNTER — Ambulatory Visit (INDEPENDENT_AMBULATORY_CARE_PROVIDER_SITE_OTHER): Payer: 59 | Admitting: Rehabilitative and Restorative Service Providers"

## 2022-12-07 DIAGNOSIS — M160 Bilateral primary osteoarthritis of hip: Secondary | ICD-10-CM

## 2022-12-07 NOTE — Progress Notes (Signed)
Physical Therapy, [facility]  [address]  [place] [address]  [phone]  Physical Therapy Progress Note       Patient Name: Sean Wall  Date of Birth: 30-May-1956  Height:     Weight:     Room/Bed: Room/bed info not found  [facility]or: [facility] / Plan: [place] [employer] / Product Type: Managed Care /       Subjective & Objective:   Pt reports left hip felt better after last treatment, but then had some abdominal issues and had limited activity due to illness.     See flowsheet for details. PPT and lumbar rotations to promote gentle ROM and mobility. Manual therapy w/ Graston Technique utilizing GT5 and GT3 to left paraspinals extending into posterolateral L hip w/ sweeping and brushing. Sidelying clamshells to promote ROM and mobility in L hip. Supine and seated exercises to promote hip and lumbar ROM and strength.     Assessment:    Pt has palpable tenderness in L lateral hip along abductors, TFL, and gluteals during soft tissue mobilization. Pt has improved soft tissue mobility and moderate redness at end of manual treatments. Pt denies significant change in symptoms at end of treatment. Pt will benefit from continued PT intervention to promote return to prior level of function.     Plan:     Continue to follow patient according to established plan of care.  The risks/benefits of therapy have been discussed with the patient/caregiver and he/she is in agreement with the established plan of care.       Therapist:   Glade Nurse, PT, DPT  12/07/2022, 08:11    Start Time: 8295  End Time: 6213  Total Treatment Time: 43 minutes, manual therapy 15 minutes, 1 unit, therapeutic exercise 28 minutes, 2 units   Physical Therapy Visit

## 2022-12-13 ENCOUNTER — Other Ambulatory Visit: Payer: Self-pay

## 2022-12-13 ENCOUNTER — Ambulatory Visit (HOSPITAL_COMMUNITY): Payer: Self-pay

## 2022-12-13 ENCOUNTER — Ambulatory Visit (INDEPENDENT_AMBULATORY_CARE_PROVIDER_SITE_OTHER): Payer: 59 | Admitting: Rehabilitative and Restorative Service Providers"

## 2022-12-13 DIAGNOSIS — M160 Bilateral primary osteoarthritis of hip: Secondary | ICD-10-CM

## 2022-12-13 NOTE — Progress Notes (Signed)
Physical Therapy, [facility]  [address]  [place] [address]  [phone]  Physical Therapy Progress Note       Patient Name: Sean Wall  Date of Birth: 13-Jul-1956  Height:     Weight:     Room/Bed: Room/bed info not found  [facility]or: [facility] / Plan: [place] [employer] / Product Type: Managed Care /       Subjective & Objective:   Pt reports pain in posterior L hip and into posterior inferior hip. Reports pain w/ activity at home including standing and walking.     See flowsheet for details. PPT and lumbar rotations to promote gentle ROM and mobility. Manual therapy w/ Graston Technique utilizing GT5 and GT3 to left paraspinals extending into posterolateral L hip w/ sweeping and brushing. Sidelying clamshells to promote ROM and mobility in L hip. Supine and seated exercises to promote hip and lumbar ROM and strength.     Assessment:    Pt has palpable tenderness and pain in L posterior hip and quadratus lumborum. Pt has tightness in quadratus lumborum and focused on QL on mild stretch. Pt has tenderness and pain in left inferior posterolateral hip as well. Pt continues to have pain at end of treatment. Pt will benefit from continued PT Intervention to promote functional mobility and ROM.     Plan:     Continue to follow patient according to established plan of care.  The risks/benefits of therapy have been discussed with the patient/caregiver and he/she is in agreement with the established plan of care.       Therapist:   Glade Nurse, PT, DPT  12/13/2022, 13:52    Start Time: 1345  End Time: 1430  Total Treatment Time: 45 minutes, therapeutic exercise  30 minutes, 2 units, manual therapy 15 minutes, 1 unit  Physical Therapy Visit

## 2022-12-29 ENCOUNTER — Emergency Department (HOSPITAL_COMMUNITY): Payer: 59

## 2022-12-29 ENCOUNTER — Observation Stay
Admission: EM | Admit: 2022-12-29 | Discharge: 2022-12-31 | Disposition: A | Discharge status: Home / Self Care | Payer: 59 | Attending: Student in an Organized Health Care Education/Training Program | Admitting: Student in an Organized Health Care Education/Training Program

## 2022-12-29 ENCOUNTER — Observation Stay (HOSPITAL_COMMUNITY): Payer: Non-veteran care | Admitting: Internal Medicine

## 2022-12-29 ENCOUNTER — Other Ambulatory Visit: Payer: Self-pay

## 2022-12-29 ENCOUNTER — Encounter (HOSPITAL_COMMUNITY): Payer: Self-pay

## 2022-12-29 DIAGNOSIS — Z136 Encounter for screening for cardiovascular disorders: Secondary | ICD-10-CM

## 2022-12-29 DIAGNOSIS — K219 Gastro-esophageal reflux disease without esophagitis: Secondary | ICD-10-CM | POA: Insufficient documentation

## 2022-12-29 DIAGNOSIS — Z7189 Other specified counseling: Secondary | ICD-10-CM | POA: Insufficient documentation

## 2022-12-29 DIAGNOSIS — M545 Low back pain, unspecified: Secondary | ICD-10-CM | POA: Insufficient documentation

## 2022-12-29 DIAGNOSIS — R1013 Epigastric pain: Secondary | ICD-10-CM

## 2022-12-29 DIAGNOSIS — G894 Chronic pain syndrome: Secondary | ICD-10-CM | POA: Insufficient documentation

## 2022-12-29 DIAGNOSIS — F39 Unspecified mood [affective] disorder: Secondary | ICD-10-CM | POA: Insufficient documentation

## 2022-12-29 DIAGNOSIS — I10 Essential (primary) hypertension: Secondary | ICD-10-CM | POA: Insufficient documentation

## 2022-12-29 DIAGNOSIS — G43909 Migraine, unspecified, not intractable, without status migrainosus: Secondary | ICD-10-CM | POA: Insufficient documentation

## 2022-12-29 DIAGNOSIS — Z719 Counseling, unspecified: Secondary | ICD-10-CM | POA: Diagnosis not present

## 2022-12-29 DIAGNOSIS — N400 Enlarged prostate without lower urinary tract symptoms: Secondary | ICD-10-CM | POA: Insufficient documentation

## 2022-12-29 DIAGNOSIS — E785 Hyperlipidemia, unspecified: Secondary | ICD-10-CM | POA: Insufficient documentation

## 2022-12-29 DIAGNOSIS — R410 Disorientation, unspecified: Secondary | ICD-10-CM | POA: Insufficient documentation

## 2022-12-29 DIAGNOSIS — R4781 Slurred speech: Secondary | ICD-10-CM | POA: Insufficient documentation

## 2022-12-29 DIAGNOSIS — K589 Irritable bowel syndrome without diarrhea: Secondary | ICD-10-CM

## 2022-12-29 DIAGNOSIS — Z8669 Personal history of other diseases of the nervous system and sense organs: Secondary | ICD-10-CM | POA: Insufficient documentation

## 2022-12-29 DIAGNOSIS — G4733 Obstructive sleep apnea (adult) (pediatric): Secondary | ICD-10-CM | POA: Insufficient documentation

## 2022-12-29 DIAGNOSIS — R2990 Unspecified symptoms and signs involving the nervous system: Secondary | ICD-10-CM

## 2022-12-29 DIAGNOSIS — Z720 Tobacco use: Secondary | ICD-10-CM | POA: Insufficient documentation

## 2022-12-29 DIAGNOSIS — Z8719 Personal history of other diseases of the digestive system: Secondary | ICD-10-CM | POA: Insufficient documentation

## 2022-12-29 DIAGNOSIS — R55 Syncope and collapse: Principal | ICD-10-CM

## 2022-12-29 DIAGNOSIS — I959 Hypotension, unspecified: Secondary | ICD-10-CM | POA: Diagnosis present

## 2022-12-29 DIAGNOSIS — Z79899 Other long term (current) drug therapy: Secondary | ICD-10-CM | POA: Insufficient documentation

## 2022-12-29 DIAGNOSIS — R109 Unspecified abdominal pain: Secondary | ICD-10-CM

## 2022-12-29 HISTORY — DX: Unspecified symptoms and signs involving the nervous system: R29.90

## 2022-12-29 HISTORY — DX: Syncope and collapse: R55

## 2022-12-29 HISTORY — DX: Epigastric pain: R10.13

## 2022-12-29 LAB — ECG 12 LEAD
Atrial Rate: 69 {beats}/min
Atrial Rate: 64 {beats}/min
Atrial Rate: 62 {beats}/min
Calculated P Axis: 65 degrees
Calculated P Axis: 63 degrees
Calculated P Axis: 74 degrees
Calculated R Axis: 8 degrees
Calculated R Axis: 7 degrees
Calculated R Axis: 18 degrees
Calculated T Axis: -7 degrees
Calculated T Axis: -8 degrees
Calculated T Axis: 6 degrees
PR Interval: 158 ms
PR Interval: 136 ms
PR Interval: 166 ms
QRS Duration: 90 ms
QRS Duration: 82 ms
QRS Duration: 100 ms
QT Interval: 376 ms
QT Interval: 388 ms
QT Interval: 404 ms
QTC Calculation: 402 ms
QTC Calculation: 400 ms
QTC Calculation: 410 ms
Ventricular rate: 69 {beats}/min
Ventricular rate: 64 {beats}/min
Ventricular rate: 62 {beats}/min

## 2022-12-29 LAB — HEPATIC FUNCTION PANEL
ALBUMIN: 3.8 g/dL (ref 3.4–4.8)
ALKALINE PHOSPHATASE: 66 U/L (ref 45–115)
ALT (SGPT): 28 U/L (ref 10–55)
AST (SGOT): 17 U/L (ref 8–45)
BILIRUBIN DIRECT: 0.2 mg/dL (ref 0.1–0.4)
BILIRUBIN TOTAL: 0.6 mg/dL (ref 0.3–1.3)
PROTEIN TOTAL: 6.9 g/dL (ref 6.0–8.0)

## 2022-12-29 LAB — URINALYSIS, MACROSCOPIC
BILIRUBIN: NOT DETECTED mg/dL
BLOOD: NOT DETECTED mg/dL
GLUCOSE: NOT DETECTED mg/dL
KETONES: NOT DETECTED mg/dL
LEUKOCYTES: NOT DETECTED WBCs/uL
NITRITE: NOT DETECTED
PH: 6.5 (ref 5.0–?)
PROTEIN: NOT DETECTED mg/dL
SPECIFIC GRAVITY: 1.022 (ref 1.005–?)
UROBILINOGEN: NOT DETECTED mg/dL

## 2022-12-29 LAB — BASIC METABOLIC PANEL
ANION GAP: 10 mmol/L (ref 4–13)
BUN/CREA RATIO: 19 (ref 6–22)
BUN: 21 mg/dL (ref 8–25)
CALCIUM: 9.5 mg/dL (ref 8.6–10.3)
CHLORIDE: 106 mmol/L (ref 96–111)
CO2 TOTAL: 26 mmol/L (ref 23–31)
CREATININE: 1.09 mg/dL (ref 0.75–1.35)
ESTIMATED GFR - MALE: 73 mL/min/BSA (ref >=60)
GLUCOSE: 117 mg/dL (ref 65–125)
POTASSIUM: 4 mmol/L (ref 3.5–5.1)
SODIUM: 142 mmol/L (ref 136–145)

## 2022-12-29 LAB — CBC WITH DIFF
BASOPHIL #: <0.1 10*3/uL (ref <=0.20)
BASOPHIL %: 0 %
EOSINOPHIL #: 0.17 10*3/uL (ref <=0.50)
EOSINOPHIL %: 3 %
HCT: 45.4 % (ref 38.9–52.0)
HGB: 15.3 g/dL (ref 13.4–17.5)
IMMATURE GRANULOCYTE #: <0.1 10*3/uL (ref <0.10)
IMMATURE GRANULOCYTE %: 0 % (ref 0.0–1.0)
LYMPHOCYTE #: 1.37 10*3/uL (ref 1.00–4.80)
LYMPHOCYTE %: 25 %
MCH: 31.4 pg (ref 26.0–32.0)
MCHC: 33.7 g/dL (ref 31.0–35.5)
MCV: 93 fL (ref 78.0–100.0)
MONOCYTE #: 0.57 10*3/uL (ref 0.20–1.10)
MONOCYTE %: 11 %
MPV: 9.6 fL (ref 8.7–12.5)
NEUTROPHIL #: 3.24 10*3/uL (ref 1.50–7.70)
NEUTROPHIL %: 61 %
PLATELETS: 217 10*3/uL (ref 150–400)
RBC: 4.88 10*6/uL (ref 4.50–6.10)
RDW-CV: 13.2 % (ref 11.5–15.5)
WBC: 5.4 10*3/uL (ref 3.7–11.0)

## 2022-12-29 LAB — TROPONIN-I
TROPONIN-I HS: <2.7 ng/L (ref <=35.0)
TROPONIN-I HS: <2.7 ng/L (ref <=35.0)
TROPONIN-I HS: <2.7 ng/L (ref <=35.0)

## 2022-12-29 LAB — LIPASE: LIPASE: 31 U/L (ref 10–60)

## 2022-12-29 LAB — PTT (PARTIAL THROMBOPLASTIN TIME): APTT: 31.7 seconds (ref 26.0–39.0)

## 2022-12-29 LAB — PT/INR
INR: 1.06 (ref <=5.00)
PROTHROMBIN TIME: 12.5 seconds (ref 9.7–13.6)

## 2022-12-29 MED ORDER — SUMATRIPTAN 50 MG TABLET
100.0000 mg | ORAL_TABLET | Freq: Once | ORAL | Status: DC | PRN
Start: 2022-12-29 — End: 2022-12-31
  Administered 2022-12-30: 100 mg via ORAL
  Filled 2022-12-29: qty 2

## 2022-12-29 MED ORDER — QUETIAPINE 25 MG TABLET
50.0000 mg | ORAL_TABLET | Freq: Every evening | ORAL | Status: DC
Start: 2022-12-29 — End: 2022-12-31
  Administered 2022-12-29 – 2022-12-30 (×2): 50 mg via ORAL
  Filled 2022-12-29 (×2): qty 2

## 2022-12-29 MED ORDER — ATORVASTATIN 40 MG TABLET
40.0000 mg | ORAL_TABLET | Freq: Every evening | ORAL | Status: DC
Start: 2022-12-29 — End: 2022-12-31
  Administered 2022-12-29 – 2022-12-30 (×2): 40 mg via ORAL
  Filled 2022-12-29 (×2): qty 1

## 2022-12-29 MED ORDER — PANTOPRAZOLE 40 MG TABLET,DELAYED RELEASE
40.0000 mg | DELAYED_RELEASE_TABLET | Freq: Every day | ORAL | Status: DC
Start: 2022-12-30 — End: 2022-12-31
  Administered 2022-12-30 – 2022-12-31 (×2): 40 mg via ORAL
  Filled 2022-12-29 (×2): qty 1

## 2022-12-29 MED ORDER — TAMSULOSIN 0.4 MG CAPSULE
0.4000 mg | ORAL_CAPSULE | Freq: Every evening | ORAL | Status: DC
Start: 2022-12-29 — End: 2022-12-31
  Administered 2022-12-29: 0.4 mg via ORAL
  Filled 2022-12-29: qty 1

## 2022-12-29 MED ORDER — ONDANSETRON HCL (PF) 4 MG/2 ML INJECTION SOLUTION
4.0000 mg | Freq: Four times a day (QID) | INTRAMUSCULAR | Status: DC | PRN
Start: 2022-12-29 — End: 2022-12-31

## 2022-12-29 MED ORDER — MAGNESIUM HYDROXIDE 400 MG/5 ML ORAL SUSPENSION
15.0000 mL | Freq: Every day | ORAL | Status: DC | PRN
Start: 2022-12-29 — End: 2022-12-31

## 2022-12-29 MED ORDER — FINASTERIDE 5 MG TABLET
5.0000 mg | ORAL_TABLET | Freq: Every day | ORAL | Status: DC
Start: 2022-12-30 — End: 2022-12-31
  Administered 2022-12-30 – 2022-12-31 (×2): 5 mg via ORAL
  Filled 2022-12-29 (×2): qty 1

## 2022-12-29 MED ORDER — LACTOBACILLUS ACIDOPH-L.BULGARICUS 1 MILLION CELL TABLET
1.0000 | ORAL_TABLET | Freq: Two times a day (BID) | ORAL | Status: DC
Start: 2022-12-30 — End: 2022-12-31
  Administered 2022-12-30 – 2022-12-31 (×3): 1 via ORAL
  Filled 2022-12-29 (×3): qty 1

## 2022-12-29 MED ORDER — SODIUM CHLORIDE 0.9 % (FLUSH) INJECTION SYRINGE
3.0000 mL | INJECTION | Freq: Three times a day (TID) | INTRAMUSCULAR | Status: DC
Start: 2022-12-29 — End: 2022-12-31
  Administered 2022-12-29 – 2022-12-30 (×3): 3 mL
  Administered 2022-12-30: 0 mL
  Administered 2022-12-31: 3 mL

## 2022-12-29 MED ORDER — METOPROLOL TARTRATE 25 MG TABLET
25.0000 mg | ORAL_TABLET | Freq: Every evening | ORAL | Status: DC
Start: 2022-12-29 — End: 2022-12-31

## 2022-12-29 MED ORDER — LORATADINE 10 MG TABLET
10.0000 mg | ORAL_TABLET | Freq: Every day | ORAL | Status: DC
Start: 2022-12-30 — End: 2022-12-31
  Administered 2022-12-30 – 2022-12-31 (×2): 10 mg via ORAL
  Filled 2022-12-29 (×2): qty 1

## 2022-12-29 MED ORDER — IOPAMIDOL 370 MG IODINE/ML (76 %) INTRAVENOUS SOLUTION
100.0000 mL | INTRAVENOUS | Status: AC
Start: 2022-12-29 — End: 2022-12-29
  Administered 2022-12-29: 100 mL via INTRAVENOUS

## 2022-12-29 MED ORDER — MELATONIN 3 MG TABLET
6.0000 mg | ORAL_TABLET | Freq: Every evening | ORAL | Status: DC
Start: 2022-12-29 — End: 2022-12-31
  Administered 2022-12-29 – 2022-12-30 (×2): 6 mg via ORAL
  Filled 2022-12-29 (×2): qty 2

## 2022-12-29 MED ORDER — ACETAMINOPHEN 500 MG TABLET
500.0000 mg | ORAL_TABLET | ORAL | Status: DC | PRN
Start: 2022-12-29 — End: 2022-12-31

## 2022-12-29 MED ORDER — BISACODYL 5 MG TABLET,DELAYED RELEASE
10.0000 mg | DELAYED_RELEASE_TABLET | ORAL | Status: DC | PRN
Start: 2022-12-29 — End: 2022-12-31

## 2022-12-29 MED ORDER — BUPROPION HCL XL 150 MG 24 HR TABLET, EXTENDED RELEASE
300.0000 mg | ORAL_TABLET | Freq: Every day | ORAL | Status: DC
Start: 2022-12-30 — End: 2022-12-31
  Administered 2022-12-30 – 2022-12-31 (×2): 300 mg via ORAL
  Filled 2022-12-29 (×2): qty 2

## 2022-12-29 MED ORDER — CYCLOBENZAPRINE 10 MG TABLET
10.0000 mg | ORAL_TABLET | Freq: Three times a day (TID) | ORAL | Status: DC | PRN
Start: 2022-12-29 — End: 2022-12-31
  Administered 2022-12-30: 10 mg via ORAL
  Filled 2022-12-29: qty 1

## 2022-12-29 MED ORDER — ENOXAPARIN 40 MG/0.4 ML SUBCUTANEOUS SYRINGE
40.0000 mg | INJECTION | SUBCUTANEOUS | Status: DC
Start: 2022-12-29 — End: 2022-12-31
  Administered 2022-12-29 – 2022-12-30 (×2): 40 mg via SUBCUTANEOUS
  Filled 2022-12-29 (×2): qty 0.4

## 2022-12-29 MED ORDER — HYDROCODONE 5 MG-ACETAMINOPHEN 325 MG TABLET
1.0000 | ORAL_TABLET | Freq: Three times a day (TID) | ORAL | Status: DC | PRN
Start: 2022-12-29 — End: 2022-12-31
  Administered 2022-12-30: 1 via ORAL
  Filled 2022-12-29: qty 1

## 2022-12-29 MED ORDER — SIMETHICONE 80 MG CHEWABLE TABLET
80.0000 mg | CHEWABLE_TABLET | Freq: Four times a day (QID) | ORAL | Status: DC | PRN
Start: 2022-12-29 — End: 2022-12-31

## 2022-12-29 MED ORDER — SODIUM CHLORIDE 0.9 % (FLUSH) INJECTION SYRINGE
3.0000 mL | INJECTION | INTRAMUSCULAR | Status: DC | PRN
Start: 2022-12-29 — End: 2022-12-31

## 2022-12-29 MED ORDER — CYANOCOBALAMIN (VIT B-12) 1,000 MCG/ML INJECTION SOLUTION
1000.0000 ug | INTRAMUSCULAR | Status: DC
Start: 2023-01-05 — End: 2022-12-31

## 2022-12-29 NOTE — Nurses Notes (Signed)
Pt arrived to room 520a from er via wheelchair. Alert and oriented. Respirations unlabored on room air. Here with syncopal episode. Denies any dizziness, numbness, tingling, or weakness currently. Oriented to room. Side rails up x2, call light in reach. Lillia Carmel, RN

## 2022-12-29 NOTE — ED Triage Notes (Signed)
Patient had a syncopal episode Monday morning lasting about 7 sec.  Unsure of what part of his body he hit when he landed denies bruises, abrasions, or areas of pain. Since the fall patient has felt tired and forgetfulness. Denies CP.

## 2022-12-29 NOTE — H&P (Signed)
History and Physical Exam     Sean Wall 67 y.o. male  Date of Birth: August 28, 1955  Date of Admit:  12/29/2022   Date of Service: 12/29/2022    Attending: Darrel Reach, MD Code Status:Prior   PCP: Marin Shutter, MD Room: C15/C15        History of Presenting Illness:   Sean Wall is a 67 y.o. male with PMH of HTN, GERD, ischemic colitis, IBS-D, BPH, OA, chronic pain syndrome, mood disorder, & tobacco dependence who presents after a syncopal episode (was using the restroom when his knee first gave out & then passed out for about 10 seconds) associated with lightheadedness and dizziness upon standing as well as generalized weakness. He also reports having intermittent confusion/forgetfulness & slurred speech for a few weeks. Thirdly, he reports having epigastric pain. Denies N/V/D, dysuria, flank pain, headache, head trauma, vision changes, fever, chills, heart palpitations, chest pain, shortness of breath.     ED Course & Medications:   Medications Administered in the ED   iopamidol (ISOVUE-370) 76% infusion (100 mL Intravenous Given 12/29/22 1732)       Review of Systems:   A 10 point organ system review was conducted with the patient and all was negative except for what was mentioned in the HPI.    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    HTN (hypertension)     Hyperlipidemia     Ischemic colitis (CMS HCC)     Sleep apnea     Tinnitus     Wears glasses       Allergies:    Allergies   Allergen Reactions    Propofol Rash    Lithium      Blurred vision     Social History  Social History     Tobacco Use    Smoking status: Light Smoker     Types: Cigars    Smokeless tobacco: Never    Tobacco comments:     OCCASIONAL SMOKING-1  CIGAR EVERY 2 WEEKS   Vaping Use    Vaping status: Never Used   Substance Use Topics    Alcohol use: Not Currently     Comment: none in the past month    Drug use: Never     Family History  Family Medical History:       Problem Relation (Age of Onset)    No Known Problems Mother, Father, Sister, Brother, Half-Sister, Half-Brother, Maternal Aunt, Maternal Uncle, Paternal Aunt, Paternal Uncle, Maternal Grandmother, Maternal Grandfather, Paternal Grandmother, Paternal Grandfather, Daughter, Son, Other           Home Meds:   HYDROcodone-acetaminophen, Ibuprofen, QUEtiapine, Testosterone, atorvastatin, azelastine-fluticasone, bisacodyL, buPROPion, cetirizine, cyanocobalamin, cyclobenzaprine, dextroamphetamine-amphetamine, finasteride, gabapentin, herbal drugs, lactobacillus comb no.10, melatonin, metoprolol tartrate, pantoprazole, simethicone, sumatriptan succinate, tadalafil, and tamsulosin         Objective   Objective Findings   Physical Exam:  BP (!) 152/80   Pulse 63   Temp 36.6 C (97.9 F)   Resp (!) 21   Ht 1.778 m (5\' 10" )   Wt  97.5 kg (215 lb)   SpO2 96%   BMI 30.85 kg/m   General: well appearing, no acute distress, vital signs reviewed  HEENT: Head NCAT, no scleral icterus, no conjunctival injection, MMM  Resp: normal WOB, CTAB, no r/r/w  CV: RRR, nlS1S2, no m/r/g  GI: +BS, soft, NT, ND, no rebound tenderness, no CVA tenderness    Ext: dry, warm, no c/c/e  Neuro:  Alert and oriented, no focal deficits appreciated   Skin: intact, no erythema, no rash/lesions noted    Summary of Lab Work and Diagnostic Studies:     Basic Metabolic Profile    Lab Results   Component Value Date/Time    SODIUM 142 12/29/2022 02:52 PM    POTASSIUM 4.0 12/29/2022 02:52 PM    CHLORIDE 106 12/29/2022 02:52 PM    CO2 26 12/29/2022 02:52 PM    ANIONGAP 10 12/29/2022 02:52 PM    Lab Results   Component Value Date/Time    BUN 21 12/29/2022 02:52 PM    CREATININE 1.09 12/29/2022 02:52 PM        Hepatic Function    Lab Results    Component Value Date/Time    ALBUMIN 3.8 12/29/2022 02:52 PM    TOTALPROTEIN 6.9 12/29/2022 02:52 PM    ALKPHOS 66 12/29/2022 02:52 PM    PROTHROMTME 12.5 12/29/2022 02:52 PM    INR 1.06 12/29/2022 02:52 PM    Lab Results   Component Value Date/Time    AST 17 12/29/2022 02:52 PM    ALT 28 12/29/2022 02:52 PM    BILIRUBINCON 0.2 12/29/2022 02:52 PM        Hemogram   Lab Results   Component Value Date/Time    WBC 5.4 12/29/2022 02:52 PM    HGB 15.3 12/29/2022 02:52 PM    HCT 45.4 12/29/2022 02:52 PM    PLTCNT 217 12/29/2022 02:52 PM    ESR 2 11/12/2021 08:42 AM    RBC 4.88 12/29/2022 02:52 PM    MCV 93.0 12/29/2022 02:52 PM    MCHC 33.7 12/29/2022 02:52 PM    MCH 31.4 12/29/2022 02:52 PM    MPV 9.6 12/29/2022 02:52 PM        Lab Results   Component Value Date    PROTHROMTME 12.5 12/29/2022    APTT 31.7 12/29/2022    INR 1.06 12/29/2022    HCT 45.4 12/29/2022    PLTCNT 217 12/29/2022     Results for orders placed or performed during the hospital encounter of 12/29/22 (from the past 24 hour(s))   CT BRAIN WO IV CONTRAST - POSSIBLE STROKE     Status: None    Narrative    History: Stroke. No clinical symptoms are provided.    Unenhanced CT imaging of the brain is performed. This CT scanner is equipped with dose reducing technology. The mAS is automatically adjusted according to patient size for optimal dose reduction.     There are no intra or extra-axial mass lesions, fluid collections, or hematomas. There is no intraparenchymal or subarachnoid hemorrhage. There is prominence of the subarachnoid spaces and ventricular system consistent with diffuse parenchymal volume loss compatible with the patient's age. There is some decreased attenuation within the periventricular white matter likely due to chronic ischemic changes. There is also subtle increased attenuation along the interhemispheric falx highly suggestive of early dural calcification. No focal hyperdensity is seen to suggest subdural hemorrhage. There is a tiny  soft tissue calcification near right frontal scalp. Mucosal thickening is noted  within the ethmoid air cells and frontal sinus.      Impression    1. Age-appropriate central and cortical atrophy.  2. No acute intracranial process.           Radiologist location ID: [id number]     CTA STROKE PROTOCOL (CTA BRAIN/CTA CAROTIDS W IV CONTRAST)     Status: None    Narrative    Male, 67 years old.    CTA STROKE PROTOCOL (CTA BRAIN/CTA CAROTIDS W IV CONTRAST) performed on 12/29/2022 5:34 PM.    REASON FOR EXAM:  Stroke  RADIATION DOSE: 1560 DLP  CONTRAST: 100 ml's of Isovue 370    TECHNIQUE: Intravenous contrast utilized for study. Volumetric acquisition of the intracranial and extracranial neck arteries. Volume rendered 3-D reconstructions of the intracranial and extracranial neck arteries also obtained. The Viz AI computer software was utilized on this exam to evaluate for large vessel occlusion. This CT scanner is equipped with dose reducing technology. The exposure is automatically adjusted according to patient body size in order to deliver the lowest dose possible.    COMPARISON: None available.    FINDINGS: Three-vessel type II aortic arch. Bilateral common carotid arteries, bilateral internal carotid arteries, and right dominant vertebral basilar system are patent. Bilateral ACAs, MCAs, and PCAs are patent. Included lungs are without acute abnormality. No acute fracture or traumatic malalignment.      Impression    No large vessel occlusion or flow-limiting stenosis.        Radiologist location ID: [id number]     CT ABDOMEN PELVIS W IV CONTRAST     Status: None    Narrative    History: Abdomen and low back pain status post syncope and fall.    CT imaging of the abdomen and pelvis is performed following IV contrast administration. This CT scanner is equipped with dose reducing technology. The mAS is automatically adjusted according to patient size for optimal dose reduction. Comparison is made to 05/12/2021 and  04/08/2021    Review of the lung bases shows mild emphysematous changes. No acute infiltrates are seen.     Within the abdomen, no acute abnormalities are seen involving the liver, spleen, pancreas, kidneys, and adrenal glands. The gallbladder is contracted.  There is a stable, tiny hepatic cyst or biliary hamartoma in the dome .    Review of the bowel shows no evidence for obstruction. There is no acute intra-abdominal inflammation. There is a very tiny periumbilical ventral hernia.    Within the pelvis, the bladder appears normal. No free fluid is seen. Postoperative changes are present within the lumbar spine. There is multilevel degenerative disc disease at. No acute fractures are seen. There is an old fracture of the right L3 transverse process.      Impression    1. No acute intra-abdominal process.      Radiologist location ID: [id number]              Assessment & Plan   Assessments/plan:  Hospital Problems (* Primary Problem)    Diagnosis Date Noted    *Syncope 12/29/2022    Epigastric pain 12/29/2022    History of migraine 12/29/2022    Chronic pain syndrome 12/29/2022    Hypertension 04/08/2021    Chronic GERD 04/08/2021    Mood disorder (CMS HCC) 04/08/2021      Resolved Hospital Problems   No resolved problems to display.      Syncope   EKG unremarkable   CTA H&N unremarkable  Orthostatic vitals ordered   Confusion, slurred speech   CT head & CTA stroke protocol unremarkable   MRI brain ordered   Neurology consulted   Epigastric pain   CT A/P unremarkable  Labs including BMP, HFP, CBC, UA, & troponin unremarkable   Pt has h/o IBS-D   Symptomatic management   Hx of migraines   Continue prn Imitrex   Hold Lopressor (currently bradycardic with orthostatic hypotension Sxs)  Chronic pain syndrome: continue Flexeril & Norco   GERD: Protonix   BPH: continue Flomax and Proscar   Mood disorder: continue Seroquel, bupropion       Nutrition: DIET REGULAR   DVT PPx:  Lovenox  Code Status: Full Code      Darrel Reach,  MD    This note may have been partially generated using MModal Fluency Direct system, and there may be some incorrect words, spellings, and punctuation that were not noted in checking the note before saving.

## 2022-12-29 NOTE — Care Management Notes (Signed)
[facility]. [facility] don't have any beds available.

## 2022-12-29 NOTE — ED Provider Notes (Signed)
Emergency Department  Provider Note  HPI - 12/29/2022      Name: Sean Wall  Age and Gender: 67 y.o. male  Attending: Dr. Federico Flake  Scribe: Donzetta Kohut, SCRIBE    PCP: Marin Shutter, MD    History provided by: Patient    HPI:  Sean Wall is a 67 y.o. male  who presents to the ED today for syncope. Pt states approximately 4 days ago while getting up in the middle of the night to use the restroom, he had a sudden onset of unsteady gait and lightheadedness, and experienced an episode of syncope. He states he "just fell back" onto the bathroom floor; pt is unsure of any injuries that may have occurred. Pt explains that since this incident, he has been experiencing recurring low BP, chest discomfort, abdominal pain, fatigue, and somewhat blurred vision, confusion, and slurred speech. Pt notes that he is "awaiting back surgery" but this fall exacerbated his lower back pain and the pain radiating down his L leg which he describes as "bee stings" into his L buttock and down his leg. Pt denies any bladder or bowel incontinence at this time. He rates his overall discomfort as moderate. No further associated symptoms or complaints are reported at this time.      Review of Systems:   Constitutional: + Lightheadedness. Fall. Fatigue. No fever, chills, weakness.  Skin: No rashes, lesions.  HENT: No sore throat, ear pain, difficulty swallowing.  Eyes: No vision changes, redness, discharge.  Cardio: + Recurring low BP. Chest discomfort. No palpitations.   Respiratory: No cough, wheezing, SOB.  GI: + Abdominal pain. No nausea/vomiting. No diarrhea, constipation. No bowel incontinence.   GU: No dysuria, hematuria, polyuria. No bladder incontinence.  MSK: + Low back pain radiating into L leg. "Bee stings" into L buttock and down leg. No neck pain.  Neuro: + Syncope. Unsteady gait. Blurred vision, confusion, and slurred speech.   All other systems reviewed and are negative, unless commented on in the HPI.      The  below information was reviewed with the patient:     Current Medications:  Current Outpatient Medications   Medication Sig    atorvastatin (LIPITOR) 40 mg Oral Tablet Take 1 Tablet (40 mg total) by mouth Every evening    azelastine-fluticasone 137-50 mcg/spray Nasal Spray, Non-Aerosol Administer 2 Sprays into affected nostril(s) Every night as needed Only uses if having a issue    bisacodyL (DULCOLAX) 5 mg Oral Tablet, Delayed Release (E.C.) Take 2 Tablets (10 mg total) by mouth Every 24 hours as needed for Constipation for up to 1 dose To be taken in the evening    buPROPion (WELLBUTRIN XL) 300 mg extended release 24 hr tablet Take 1 Tablet (300 mg total) by mouth Once a day    cetirizine (ZYRTEC) 10 mg Oral Tablet Take 1 Tablet (10 mg total) by mouth Every night as needed for Allergies    cyanocobalamin (VITAMIN B12) 1,000 mcg/mL Injection Solution 1 mL (1,000 mcg total) Every 30 days Takes on the 21st    cyclobenzaprine (FLEXERIL) 10 mg Oral Tablet Take 1 Tablet (10 mg total) by mouth Three times a day as needed Usually take at one efery  HS    dextroamphetamine-amphetamine (ADDERALL) 10 mg Oral Tablet Take 1 Tablet (10 mg total) by mouth Once per day as needed for Other    ferrous sulfate (FERATAB) 324 mg (65 mg iron) Oral Tablet, Delayed Release (E.C.) Take 1 Tablet (324 mg  total) by mouth Every other day On Monday, Wednesday, friday (Patient not taking: Reported on 10/19/2022)    finasteride (PROSCAR) 5 mg Oral Tablet Take 1 Tablet (5 mg total) by mouth Once a day    gabapentin (NEURONTIN) 300 mg Oral Capsule Take 1 Capsule (300 mg total) by mouth Three times a day Takes bid    HERBAL DRUGS ORAL Take 700 mg by mouth Once a day PEPPERMINT LEAF    HYDROcodone-acetaminophen (NORCO) 5-325 mg Oral Tablet Take 1 Tablet by mouth Every 8 hours as needed for Pain    HYDROcodone-acetaminophen (NORCO) 5-325 mg Oral Tablet Take 1-2 Tablets by mouth Every 6 hours as needed for Pain    Ibuprofen (MOTRIN) 600 mg Oral Tablet  Take 1 Tablet (600 mg total) by mouth Four times a day as needed for Pain    lactobacillus comb no.10 (PROBIOTIC) 20 billion cell Oral Capsule Take 1 Capsule by mouth Once a day    melatonin 3 mg Oral Capsule Take 2 Capsules (6 mg total) by mouth Every night    metoprolol tartrate (LOPRESSOR) 25 mg Oral Tablet Take 1 Tablet (25 mg total) by mouth Every night    pantoprazole (PROTONIX) 20 mg Oral Tablet, Delayed Release (E.C.) Take 1 Tablet (20 mg total) by mouth Twice daily    predniSONE (DELTASONE) 10 mg Oral Tablet 4 TABLETS QAM X 3 DAYS, THEN 3 TABLETS QAM X 3 DAYS, THEN 2 TABLETS QAM X 3 DAYS, THEN 1 TABLET QAM X 3 DAYS (Patient not taking: Reported on 10/25/2022)    QUEtiapine (SEROQUEL) 50 mg Oral Tablet Take 1 Tablet (50 mg total) by mouth Every night    simethicone (MYLICON) 80 mg Oral Tablet, Chewable Chew 1 Tablet (80 mg total) Every 6 hours as needed    sumatriptan succinate (IMITREX) 100 mg Oral Tablet Take 1 Tablet (100 mg total) by mouth Once, as needed for Migraine May repeat in 2 hours in needed    tadalafil (CIALIS) 10 mg Oral Tablet Take 1 Tablet (10 mg total) by mouth Every 24 hours as needed    tadalafil (CIALIS) 20 mg Oral Tablet Take 1 Tablet (20 mg total) by mouth Every 24 hours as needed    tamsulosin (FLOMAX) 0.4 mg Oral Capsule Take 1 Capsule (0.4 mg total) by mouth Every night    Testosterone 1 % (25 mg/2.5gram) Transdermal Gel in Packet Place 2 Each on the skin Once a day 2 pumps each shoulder daily am       Allergies:   Allergies   Allergen Reactions    Propofol Rash    Lithium      Blurred vision       Past Medical History:  Past Medical History:   Diagnosis Date    Acid reflux     CPAP (continuous positive airway pressure) dependence     Enlarged prostate     Erectile dysfunction     H/O hearing loss     Headache     Hemorrhoids     History of anesthesia complications     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    HTN (hypertension)     Hyperlipidemia     Ischemic colitis  (CMS HCC)     Sleep apnea     Tinnitus     Wears glasses        Past Surgical History:  Past Surgical History:   Procedure Laterality Date    Colonoscopy  04/09/2021    Elbow surgery Left  Hx appendectomy  1978    Hx carpal tunnel release Bilateral     Hx cyst removal Right 1990    Hx lumbar fusion  2018    Hx nose/sinus surgery  2005    Hx shoulder surgery Bilateral     Hx turp      Hx vasectomy      Inguinal hernia repair Right 1991    Lasik Bilateral 2004       Social History:  Social History     Tobacco Use    Smoking status: Light Smoker     Types: Cigars    Smokeless tobacco: Never    Tobacco comments:     OCCASIONAL SMOKING-1 CIGAR EVERY 2 WEEKS   Vaping Use    Vaping status: Never Used   Substance Use Topics    Alcohol use: Not Currently     Comment: none in the past month    Drug use: Never     Social History     Substance and Sexual Activity   Drug Use Never       Family History:  Family History   Problem Relation Age of Onset    No Known Problems Mother     No Known Problems Father     No Known Problems Sister     No Known Problems Brother     No Known Problems Half-Sister     No Known Problems Half-Brother     No Known Problems Maternal Aunt     No Known Problems Maternal Uncle     No Known Problems Paternal Aunt     No Known Problems Paternal Uncle     No Known Problems Maternal Grandmother     No Known Problems Maternal Grandfather     No Known Problems Paternal Grandmother     No Known Problems Paternal Grandfather     No Known Problems Daughter     No Known Problems Son     No Known Problems Other        Old records were reviewed.    Objective:  Nursing notes were reviewed.    Filed Vitals:    12/29/22 1715 12/29/22 1814 12/29/22 1816 12/29/22 1817   BP:  129/80 (!) 136/90 119/87   Pulse: 62 61 66 68   Resp: 14 19 17 18    SpO2: 97% 98% 98% 95%       Physical Exam:  Nursing note and vitals reviewed.  Vital signs reviewed as above.     Constitutional: Patient awake, alert in no acute  distress.  Skin: Skin is warm and dry. No rash, lesion, or abscess noted.  HEENT: Head is normocephalic and atraumatic. Mucous membranes are moist. EOM intact. Pupils are equal, round, and reactive to light and accomodation. Uvula midline.   Cardiovascular: Normal rate and regular rhythm. No murmur heard.   Pulmonary/Chest: Normal breath sounds auscultated BL. No wheezing. Pulmonary effort is normal. No respiratory distress.  GI/Abdomen: + Diffuse abdominal tenderness. Abdomen is soft, nondistended. No rebound or guarding.     Musculoskeletal: Normal range of motion. No tenderness and no deformity noted. Neck is soft and supple with full ROM.  Neurological: Patient is alert and oriented to person, place, and time with normal speech; spontaneously moving all extremities. Cranial nerves 2-12 intact. No focal deficits.  Psychiatric: Mood and affect appropriate for situation.    Plan:   Appropriate labs and/or imaging ordered. Medical Records reviewed.    Work-up:  Orders Placed This  Encounter    CTA STROKE PROTOCOL (CTA BRAIN/CTA CAROTIDS W IV CONTRAST)    CT ABDOMEN PELVIS W IV CONTRAST    CT BRAIN WO IV CONTRAST - POSSIBLE STROKE    TROPONIN-I - TO BE DRAWN NOW    TROPONIN-I - TO BE DRAWN IN ONE HOUR AFTER INITIAL DRAW    TROPONIN-I - TO BE DRAWN IN 3 HOURS AFTER INITIAL DRAW    CBC/DIFF    BASIC METABOLIC PANEL    HEPATIC FUNCTION PANEL    LIPASE    PTT (PARTIAL THROMBOPLASTIN TIME)    PT/INR    URINALYSIS, MACROSCOPIC    CBC WITH DIFF    ECG 12 LEAD ONE TIME    ECG 12 LEAD ONE TIME    ECG 12 LEAD ONE TIME    PATIENT CLASS/LEVEL OF CARE DESIGNATION - [facility]    iopamidol (ISOVUE-370) 76% infusion        Labs:  Results for orders placed or performed during the hospital encounter of 12/29/22 (from the past 24 hour(s))   TROPONIN-I - TO BE DRAWN NOW   Result Value Ref Range    TROPONIN-I HS <2.7 <=35.0 ng/L ng/L   TROPONIN-I - TO BE DRAWN IN ONE HOUR AFTER INITIAL DRAW   Result Value Ref Range    TROPONIN-I HS <2.7 <=35.0  ng/L ng/L   TROPONIN-I - TO BE DRAWN IN 3 HOURS AFTER INITIAL DRAW   Result Value Ref Range    TROPONIN-I HS <2.7 <=35.0 ng/L ng/L   CBC/DIFF    Narrative    The following orders were created for panel order CBC/DIFF.  Procedure                               Abnormality         Status                     ---------                               -----------         ------                     CBC WITH ZOXW[[id number]]                                    Final result                 Please view results for these tests on the individual orders.   BASIC METABOLIC PANEL   Result Value Ref Range    SODIUM 142 136 - 145 mmol/L    POTASSIUM 4.0 3.5 - 5.1 mmol/L    CHLORIDE 106 96 - 111 mmol/L    CO2 TOTAL 26 23 - 31 mmol/L    ANION GAP 10 4 - 13 mmol/L    CALCIUM 9.5 8.6 - 10.3 mg/dL    GLUCOSE 119 65 - 147 mg/dL    BUN 21 8 - 25 mg/dL    CREATININE 8.29 5.62 - 1.35 mg/dL    BUN/CREA RATIO 19 6 - 22    ESTIMATED GFR - MALE 73 >=60 mL/min/BSA   HEPATIC FUNCTION PANEL   Result Value Ref Range    ALBUMIN 3.8 3.4 - 4.8 g/dL  ALKALINE PHOSPHATASE 66 45 - 115 U/L    ALT (SGPT) 28 10 - 55 U/L    AST (SGOT)  17 8 - 45 U/L    BILIRUBIN TOTAL 0.6 0.3 - 1.3 mg/dL    BILIRUBIN DIRECT 0.2 0.1 - 0.4 mg/dL    PROTEIN TOTAL 6.9 6.0 - 8.0 g/dL   LIPASE   Result Value Ref Range    LIPASE 31 10 - 60 U/L   PTT (PARTIAL THROMBOPLASTIN TIME)   Result Value Ref Range    APTT 31.7 26.0 - 39.0 seconds    Narrative    Therapeutic range for unfractionated heparin is 60-100 seconds.   PT/INR   Result Value Ref Range    PROTHROMBIN TIME 12.5 9.7 - 13.6 seconds    INR 1.06 <=5.00   URINALYSIS, MACROSCOPIC   Result Value Ref Range    COLOR Light Yellow Colorless, Straw, Yellow, Light Yellow    APPEARANCE Clear Clear    SPECIFIC GRAVITY 1.022 >1.005 - <1.030    PH 6.5 >5.0 - <8.0    PROTEIN Not Detected Not Detected mg/dL    GLUCOSE Not Detected Not Detected mg/dL    KETONES Not Detected Not Detected mg/dL    UROBILINOGEN Not Detected Not Detected mg/dL     BILIRUBIN Not Detected Not Detected mg/dL    BLOOD Not Detected Not Detected mg/dL    NITRITE Not Detected Not Detected    LEUKOCYTES Not Detected Not Detected WBCs/uL   CBC WITH DIFF   Result Value Ref Range    WBC 5.4 3.7 - 11.0 x10^3/uL    RBC 4.88 4.50 - 6.10 x10^6/uL    HGB 15.3 13.4 - 17.5 g/dL    HCT 16.1 09.6 - 04.5 %    MCV 93.0 78.0 - 100.0 fL    MCH 31.4 26.0 - 32.0 pg    MCHC 33.7 31.0 - 35.5 g/dL    RDW-CV 40.9 81.1 - 91.4 %    PLATELETS 217 150 - 400 x10^3/uL    MPV 9.6 8.7 - 12.5 fL    NEUTROPHIL % 61.0 %    LYMPHOCYTE % 25.0 %    MONOCYTE % 11.0 %    EOSINOPHIL % 3.0 %    BASOPHIL % 0.0 %    NEUTROPHIL # 3.24 1.50 - 7.70 x10^3/uL    LYMPHOCYTE # 1.37 1.00 - 4.80 x10^3/uL    MONOCYTE # 0.57 0.20 - 1.10 x10^3/uL    EOSINOPHIL # 0.17 <=0.50 x10^3/uL    BASOPHIL # <0.10 <=0.20 x10^3/uL    IMMATURE GRANULOCYTE % 0.0 0.0 - 1.0 %    IMMATURE GRANULOCYTE # <0.10 <0.10 x10^3/uL       Abnormal Lab results:  Labs Ordered/Reviewed   TROPONIN-I - Normal   TROPONIN-I - Normal   TROPONIN-I - Normal   BASIC METABOLIC PANEL - Normal   HEPATIC FUNCTION PANEL - Normal   LIPASE - Normal   PTT (PARTIAL THROMBOPLASTIN TIME) - Normal    Narrative:     Therapeutic range for unfractionated heparin is 60-100 seconds.   PT/INR - Normal   URINALYSIS, MACROSCOPIC - Normal   CBC/DIFF    Narrative:     The following orders were created for panel order CBC/DIFF.                  Procedure  Abnormality         Status                                     ---------                               -----------         ------                                     CBC WITH ZOXW[[id number]]                                    Final result                                                 Please view results for these tests on the individual orders.   CBC WITH DIFF       Imaging:   Results for orders placed or performed during the hospital encounter of 12/29/22 (from the past 72 hour(s))   CT BRAIN WO IV CONTRAST -  POSSIBLE STROKE     Status: None    Narrative    History: Stroke. No clinical symptoms are provided.    Unenhanced CT imaging of the brain is performed. This CT scanner is equipped with dose reducing technology. The mAS is automatically adjusted according to patient size for optimal dose reduction.     There are no intra or extra-axial mass lesions, fluid collections, or hematomas. There is no intraparenchymal or subarachnoid hemorrhage. There is prominence of the subarachnoid spaces and ventricular system consistent with diffuse parenchymal volume loss compatible with the patient's age. There is some decreased attenuation within the periventricular white matter likely due to chronic ischemic changes. There is also subtle increased attenuation along the interhemispheric falx highly suggestive of early dural calcification. No focal hyperdensity is seen to suggest subdural hemorrhage. There is a tiny soft tissue calcification near right frontal scalp. Mucosal thickening is noted within the ethmoid air cells and frontal sinus.      Impression    1. Age-appropriate central and cortical atrophy.  2. No acute intracranial process.           Radiologist location ID: [id number]     CTA STROKE PROTOCOL (CTA BRAIN/CTA CAROTIDS W IV CONTRAST)     Status: None    Narrative    Male, 67 years old.    CTA STROKE PROTOCOL (CTA BRAIN/CTA CAROTIDS W IV CONTRAST) performed on 12/29/2022 5:34 PM.    REASON FOR EXAM:  Stroke  RADIATION DOSE: 1560 DLP  CONTRAST: 100 ml's of Isovue 370    TECHNIQUE: Intravenous contrast utilized for study. Volumetric acquisition of the intracranial and extracranial neck arteries. Volume rendered 3-D reconstructions of the intracranial and extracranial neck arteries also obtained. The Viz AI computer software was utilized on this exam to evaluate for large vessel occlusion. This CT scanner is equipped with dose reducing technology. The exposure is automatically adjusted according to patient body size in  order to deliver the lowest dose  possible.    COMPARISON: None available.    FINDINGS: Three-vessel type II aortic arch. Bilateral common carotid arteries, bilateral internal carotid arteries, and right dominant vertebral basilar system are patent. Bilateral ACAs, MCAs, and PCAs are patent. Included lungs are without acute abnormality. No acute fracture or traumatic malalignment.      Impression    No large vessel occlusion or flow-limiting stenosis.        Radiologist location ID: [id number]     CT ABDOMEN PELVIS W IV CONTRAST     Status: None    Narrative    History: Abdomen and low back pain status post syncope and fall.    CT imaging of the abdomen and pelvis is performed following IV contrast administration. This CT scanner is equipped with dose reducing technology. The mAS is automatically adjusted according to patient size for optimal dose reduction. Comparison is made to 05/12/2021 and 04/08/2021    Review of the lung bases shows mild emphysematous changes. No acute infiltrates are seen.     Within the abdomen, no acute abnormalities are seen involving the liver, spleen, pancreas, kidneys, and adrenal glands. The gallbladder is contracted.  There is a stable, tiny hepatic cyst or biliary hamartoma in the dome .    Review of the bowel shows no evidence for obstruction. There is no acute intra-abdominal inflammation. There is a very tiny periumbilical ventral hernia.    Within the pelvis, the bladder appears normal. No free fluid is seen. Postoperative changes are present within the lumbar spine. There is multilevel degenerative disc disease at. No acute fractures are seen. There is an old fracture of the right L3 transverse process.      Impression    1. No acute intra-abdominal process.      Radiologist location ID: [id number]         ECG:    Date/Time ECG Read: 05\16\ 2024 15:00   Most Recent EKG This Encounter   ECG 12 LEAD ONE TIME    Collection Time: 12/29/22  2:57 PM   Result Value    Ventricular rate 69     Atrial Rate 69    PR Interval 158    QRS Duration 90    QT Interval 376    QTC Calculation 402    Calculated P Axis 65    Calculated R Axis 8    Calculated T Axis -7    Narrative    Normal sinus rhythm  Low voltage QRS  Borderline ECG     NO STEMI  REVIEWED BY DR. Maisie Fus Bradley Bostelman @1500   Confirmed by Federico Flake ([id number]), editor Donzetta Kohut [phone]) on 12/29/2022 3:21:42 PM     Consults:   4696 - requesting pt's admission via secure chat.   2952 - Dr. Lake Bells accepted pt for admission.    MDM:   Medical Decision Making  Amount and/or Complexity of Data Reviewed  Labs: ordered.  Radiology: ordered.  ECG/medicine tests: ordered.    Risk  Decision regarding hospitalization.    Labs and imaging reviewed.  No obvious findings however patient's symptoms are concerning.  He is outside of the window for any stroke intervention however will need further workup for his syncope.  Discussed with the hospitalist team who agrees to admit further evaluation and treatment         Impression:   Diagnoses       Diagnosis Comment Added By Time Added    Syncope, unspecified syncope type  Lafayette Dragon, MD  12/29/2022  6:33 PM    Abdominal pain, unspecified abdominal location  Lafayette Dragon, MD 12/29/2022  6:33 PM          Disposition:   Admitted    Patient will be admitted to Dr. Lake Bells' service for further evaluation and management.        I am scribing for, and in the presence of, Federico Flake, MD for services provided on 12/29/2022.  Donzetta Kohut, SCRIBE   [place], SCRIBE  12/29/2022, 14:50    This note may have been partially generated using MModal Fluency Direct system, and there may be some incorrect words, spellings, and punctuation that were not noted in checking the note before saving.    I personally performed the services described in this documentation, as scribed  in my presence, and it is both accurate  and complete.    Lafayette Dragon, MD  Lafayette Dragon, MD

## 2022-12-29 NOTE — ED Nurses Note (Signed)
Report called to Holly, RN

## 2022-12-30 ENCOUNTER — Encounter (HOSPITAL_COMMUNITY): Payer: Self-pay | Admitting: Internal Medicine

## 2022-12-30 DIAGNOSIS — M5030 Other cervical disc degeneration, unspecified cervical region: Secondary | ICD-10-CM

## 2022-12-30 DIAGNOSIS — R55 Syncope and collapse: Secondary | ICD-10-CM

## 2022-12-30 DIAGNOSIS — G4733 Obstructive sleep apnea (adult) (pediatric): Secondary | ICD-10-CM

## 2022-12-30 DIAGNOSIS — E785 Hyperlipidemia, unspecified: Secondary | ICD-10-CM

## 2022-12-30 DIAGNOSIS — I959 Hypotension, unspecified: Secondary | ICD-10-CM

## 2022-12-30 DIAGNOSIS — Z9049 Acquired absence of other specified parts of digestive tract: Secondary | ICD-10-CM

## 2022-12-30 DIAGNOSIS — I10 Essential (primary) hypertension: Secondary | ICD-10-CM

## 2022-12-30 DIAGNOSIS — Z719 Counseling, unspecified: Secondary | ICD-10-CM

## 2022-12-30 HISTORY — DX: Hypotension, unspecified: I95.9

## 2022-12-30 HISTORY — DX: Counseling, unspecified: Z71.9

## 2022-12-30 LAB — BASIC METABOLIC PANEL
ANION GAP: 6 mmol/L (ref 4–13)
BUN/CREA RATIO: 22 (ref 6–22)
BUN: 20 mg/dL (ref 8–25)
CALCIUM: 8.9 mg/dL (ref 8.6–10.3)
CHLORIDE: 107 mmol/L (ref 96–111)
CO2 TOTAL: 24 mmol/L (ref 23–31)
CREATININE: 0.93 mg/dL (ref 0.75–1.35)
ESTIMATED GFR - MALE: 89 mL/min/BSA (ref >=60)
GLUCOSE: 117 mg/dL (ref 65–125)
POTASSIUM: 3.7 mmol/L (ref 3.5–5.1)
SODIUM: 137 mmol/L (ref 136–145)

## 2022-12-30 LAB — CBC WITH DIFF
BASOPHIL #: <0.1 10*3/uL (ref <=0.20)
BASOPHIL %: 0 %
EOSINOPHIL #: 0.18 10*3/uL (ref <=0.50)
EOSINOPHIL %: 3 %
HCT: 43.2 % (ref 38.9–52.0)
HGB: 14.5 g/dL (ref 13.4–17.5)
IMMATURE GRANULOCYTE #: <0.1 10*3/uL (ref <0.10)
IMMATURE GRANULOCYTE %: 0 % (ref 0.0–1.0)
LYMPHOCYTE #: 1.61 10*3/uL (ref 1.00–4.80)
LYMPHOCYTE %: 29 %
MCH: 31.4 pg (ref 26.0–32.0)
MCHC: 33.6 g/dL (ref 31.0–35.5)
MCV: 93.5 fL (ref 78.0–100.0)
MONOCYTE #: 0.48 10*3/uL (ref 0.20–1.10)
MONOCYTE %: 9 %
MPV: 9.6 fL (ref 8.7–12.5)
NEUTROPHIL #: 3.18 10*3/uL (ref 1.50–7.70)
NEUTROPHIL %: 59 %
PLATELETS: 190 10*3/uL (ref 150–400)
RBC: 4.62 10*6/uL (ref 4.50–6.10)
RDW-CV: 13.2 % (ref 11.5–15.5)
WBC: 5.5 10*3/uL (ref 3.7–11.0)

## 2022-12-30 LAB — PHOSPHORUS: PHOSPHORUS: 3.3 mg/dL (ref 2.3–4.0)

## 2022-12-30 LAB — MAGNESIUM: MAGNESIUM: 1.9 mg/dL (ref 1.8–2.6)

## 2022-12-30 MED ORDER — GABAPENTIN 300 MG CAPSULE
300.0000 mg | ORAL_CAPSULE | Freq: Two times a day (BID) | ORAL | Status: DC
Start: 2022-12-30 — End: 2022-12-30

## 2022-12-30 NOTE — Care Management Notes (Signed)
[facility] : [facility]  Care Management Initial Evaluation    Patient Name: Sean Wall  Date of Birth: 11-08-1955  Sex: male  Date/Time of Admission: 12/29/2022  2:35 PM  Room/Bed: 520/A  Payor: HUMANA MEDICARE / Plan: HUMANA CHOICE PPO / Product Type: PPO /   PCP: Marin Shutter, MD    Pharmacy Info:   Preferred Pharmacy       [facility] [phone] - [place], [place] - [address] AT [facility] [address]    [address] [place] [address]    Phone: [phone] Fax: [phone]    Hours: Not open 24 hours    [facility] - [place], [place] - [address]    [address] [place] [address]    Phone: [phone] Fax: [phone]    Hours: Not open 24 hours          Emergency Contact Info:   Extended Emergency Contact Information  Primary Emergency Contact: [facility] MARIE  Address: [address]           [place], [place] [address] [place]  Home Phone: [phone]  Work Phone: [phone]  Mobile Phone: [phone]  Relation: Wife  Preferred language: English  Interpreter needed? No    History:   Jamesmatthew Rappold Crotty is a 67 y.o., male, admitted obs.    Height/Weight: 177.8 cm (5\' 10" ) / 93 kg (205 lb)     LOS: 0 days   Admitting Diagnosis: Syncope [R55]    Assessment:      12/30/22 1357   Assessment Details   Assessment Type Admission   Living Environment   Select an age group to open "lives with" row.  Adult   Lives With spouse   Living Arrangements house   Able to Return to Prior Arrangements yes   Home Safety   Home Accessibility stairs to enter home   Care Management Plan   Discharge Planning Status initial meeting   Discharge plan discussed with: Patient   Discharge Needs Assessment   Equipment Currently Used at Home cpap   Equipment Needed After Discharge none   Discharge Facility/Level of Care Needs Home (Patient/Family Member/other)(code 1)   Referral Information   Admission Type observation   ADVANCE  DIRECTIVES   Does the Patient have an Advance Directive? No, Information Offered and Given   Mutuality/Individual Preferences    Patient-Specific Preferences Patient wants morning appointments, any day of the week.   Home Main Entrance   Number of Stairs, Main Entrance one         Discharge Plan:  Home (Patient/Family Member/other) (code 1)  Spoke with patient regarding discharge plan.  Patient lives in a house with his wife.  He has 1 step to enter his home.  Patient doesn't have home O2.  Patient drives to his appointments.  Patient has a CPAP machine.  Patient denies need for more DME.  Patient denies need for home health.  Patient has an advanced directive booklet.  He said he will fill it out.  Patient let me know [facility].  I sent an e-mail to [facility] reg.  Patient's discharge plan is home.    The patient will continue to be evaluated for developing discharge needs.     Case Manager: Delfin Gant, CASE MANAGER  Phone: [phone]

## 2022-12-30 NOTE — Care Plan (Signed)
Problem: Adult Inpatient Plan of Care  Goal: Plan of Care Review  Outcome: Ongoing (see interventions/notes)  Goal: Patient-Specific Goal (Individualized)  Outcome: Ongoing (see interventions/notes)  Flowsheets (Taken 12/30/2022 0847)  Patient would like to participate in bedside shift report: No  Individualized Care Needs: Monitoring BPs  Anxieties, Fears or Concerns: none verbalized  Patient-Specific Goals (Include Timeframe): discharge  Plan of Care Reviewed With: patient  Goal: Absence of Hospital-Acquired Illness or Injury  Outcome: Ongoing (see interventions/notes)  Intervention: Identify and Manage Fall Risk  Recent Flowsheet Documentation  Taken 12/30/2022 0847 by Joline Salt, RN  Safety Promotion/Fall Prevention: activity supervised  Intervention: Prevent and Manage VTE (Venous Thromboembolism) Risk  Recent Flowsheet Documentation  Taken 12/30/2022 0847 by Joline Salt, RN  VTE Prevention/Management: ambulation promoted  Goal: Optimal Comfort and Wellbeing  Outcome: Ongoing (see interventions/notes)  Intervention: Provide Person-Centered Care  Recent Flowsheet Documentation  Taken 12/30/2022 0847 by Joline Salt, RN  Trust Relationship/Rapport:   care explained   reassurance provided  Goal: Rounds/Family Conference  Outcome: Ongoing (see interventions/notes)     Problem: Fall Injury Risk  Goal: Absence of Fall and Fall-Related Injury  Outcome: Ongoing (see interventions/notes)  Intervention: Identify and Manage Contributors  Recent Flowsheet Documentation  Taken 12/30/2022 0847 by Joline Salt, RN  Medication Review/Management: medications reviewed  Intervention: Promote Injury-Free Environment  Recent Flowsheet Documentation  Taken 12/30/2022 0847 by Joline Salt, RN  Safety Promotion/Fall Prevention: activity supervised    Pt currently sitting up in bed watching TV.  No s/s of acute distress noted.  VS WNL.  No acute events today.  Pt's wife in to visit today.  Medcated x 1 this shift for lower back  pain.  No other issues this shift. Denies needs at this time.  POC ongoing.

## 2022-12-30 NOTE — Progress Notes (Signed)
[facility]  Progress Note    Sean Wall  Date of service: 12/30/2022  Date of Admission:  12/29/2022  Hospital Day:  LOS: 0 days     Assessment/ Plan:   Hospital Problems (* Primary Problem)    Diagnosis Date Noted    *Syncope 12/29/2022    Orthostatic hypotension 12/30/2022    Health education/counseling 12/30/2022    History of migraine 12/29/2022    Chronic pain syndrome 12/29/2022    Hypertension 04/08/2021    Chronic GERD 04/08/2021    Mood disorder (CMS HCC) 04/08/2021      Resolved Hospital Problems    Diagnosis Date Noted Date Resolved    Epigastric pain 12/29/2022 12/30/2022    Stroke-like symptom 12/29/2022 12/30/2022      Syncope, likely orthostatic  Hypotension in setting primary hypertension  Stroke-like symptoms-resolved  Neurology consulted/following.  No further neurological workup needed.  Currently, waiting orthostatic vital signs, however patient's vital signs show hypotension and therefore holding patient's home antihypertensive medications.  Will start patient IVF after completion of orthostatic vital signs.  Will continue to monitor patient clinically.    Other chronic comorbidities  Continue home medication and titrate accordingly      Subjective/interval history:  Patient seen and examined at bedside.  Patient states he was experiencing dizziness as home which has worsened and becomes exhausted with minimal exertion.  Patient states he only notices dizziness in standing position.      Objective     Vital Signs:  Vitals:    12/30/22 0721 12/30/22 0732 12/30/22 0800 12/30/22 1134   BP: (!) 86/63 (!) 80/48 102/74 105/71   Pulse: 56 57  61   Resp: 16 20  16    Temp: 36.6 C (97.9 F)   37 C (98.6 F)   SpO2: 95% 95%  96%   Weight:       Height:       BMI:                I/O:  I/O last 24 hours:    Intake/Output Summary (Last 24 hours) at 12/30/2022 1404  Last data filed at 12/30/2022 0600  Gross per 24 hour   Intake 190 ml   Output --   Net 190 ml         acetaminophen  (TYLENOL) tablet, 500 mg, Oral, Q4H PRN  atorvastatin (LIPITOR) tablet, 40 mg, Oral, QPM  bisacodyl (DULCOLAX) enteric coated tablet, 10 mg, Oral, Q24 H PRN  buPROPion (WELLBUTRIN XL) 24 hr extended release tablet, 300 mg, Oral, Daily  [START ON 01/05/2023] cyanocobalamin (VITAMIN B12) 1000 mcg/mL injection, 1,000 mcg, IntraMUSCULAR, Q30 Days  cyclobenzaprine (FLEXERIL) tablet, 10 mg, Oral, 3x/day PRN  enoxaparin PF (LOVENOX) 40 mg/0.4 mL SubQ injection, 40 mg, Subcutaneous, Q24H  finasteride (PROSCAR) tablet, 5 mg, Oral, Daily  HYDROcodone-acetaminophen (NORCO) 5-325 mg per tablet, 1 Tablet, Oral, Q8H PRN  lactobacillus acidoph-bulgar (FLORANEX) 1 million cell tablet, 1 Tablet, Oral, 2x/day-Food  loratadine (CLARITIN) tablet, 10 mg, Oral, Daily  magnesium hydroxide (MILK OF MAGNESIA) 400mg  per 5mL oral liquid, 15 mL, Oral, Daily PRN  melatonin tablet, 6 mg, Oral, NIGHTLY  [Held by provider] metoprolol tartrate (LOPRESSOR) tablet, 25 mg, Oral, NIGHTLY  NS flush syringe, 3 mL, Intracatheter, Q8HRS  NS flush syringe, 3 mL, Intracatheter, Q1H PRN  ondansetron (ZOFRAN) 2 mg/mL injection, 4 mg, Intravenous, Q6H PRN  pantoprazole (PROTONIX) delayed release tablet, 40 mg, Oral, Daily  QUEtiapine (SEROquel) tablet, 50 mg, Oral, NIGHTLY  simethicone (MYLICON) chewable tablet, 80 mg, Oral, Q6H PRN  SUMAtriptan (IMITREX) tablet, 100 mg, Oral, Once PRN  [Held by provider] tamsulosin (FLOMAX) capsule, 0.4 mg, Oral, NIGHTLY          MY ORDERS LAST 24 (24h ago, onward)       Start     Ordered    12/30/22 2100  gabapentin (NEURONTIN) capsule  2 TIMES DAILY,   Status:  Discontinued         12/30/22 1401    12/30/22 1415  ORTHOSTATIC BLOOD PRESSURE & PULSE  ONE TIME         12/30/22 1401                      Physical Exam  Vitals and nursing note reviewed.   Constitutional:       General: He is not in acute distress.     Appearance: He is not ill-appearing.   HENT:      Head: Normocephalic and atraumatic.      Nose: Nose normal.       Mouth/Throat:      Pharynx: Oropharynx is clear.   Eyes:      Extraocular Movements: Extraocular movements intact.      Conjunctiva/sclera: Conjunctivae normal.   Neck:      Vascular: No JVD.   Cardiovascular:      Rate and Rhythm: Normal rate and regular rhythm.   Pulmonary:      Effort: Pulmonary effort is normal. No respiratory distress.      Breath sounds: Normal breath sounds.   Abdominal:      General: Bowel sounds are normal.      Palpations: Abdomen is soft.   Genitourinary:     Comments: Defer  Musculoskeletal:         General: Normal range of motion.      Cervical back: Normal range of motion.   Neurological:      General: No focal deficit present.      Mental Status: He is alert and oriented to person, place, and time. Mental status is at baseline.   Psychiatric:         Mood and Affect: Mood and affect normal.         Behavior: Behavior normal.         Thought Content: Thought content normal.             Labs:  Results for orders placed or performed during the hospital encounter of 12/29/22 (from the past 24 hour(s))   TROPONIN-I - TO BE DRAWN NOW   Result Value Ref Range    TROPONIN-I HS <2.7 <=35.0 ng/L ng/L   TROPONIN-I - TO BE DRAWN IN ONE HOUR AFTER INITIAL DRAW   Result Value Ref Range    TROPONIN-I HS <2.7 <=35.0 ng/L ng/L   TROPONIN-I - TO BE DRAWN IN 3 HOURS AFTER INITIAL DRAW   Result Value Ref Range    TROPONIN-I HS <2.7 <=35.0 ng/L ng/L   CBC/DIFF    Narrative    The following orders were created for panel order CBC/DIFF.  Procedure                               Abnormality         Status                     ---------                               -----------         ------  CBC WITH GEXB[[id number]]                                    Final result                 Please view results for these tests on the individual orders.   BASIC METABOLIC PANEL   Result Value Ref Range    SODIUM 142 136 - 145 mmol/L    POTASSIUM 4.0 3.5 - 5.1 mmol/L    CHLORIDE 106 96 - 111 mmol/L    CO2  TOTAL 26 23 - 31 mmol/L    ANION GAP 10 4 - 13 mmol/L    CALCIUM 9.5 8.6 - 10.3 mg/dL    GLUCOSE 102 65 - 725 mg/dL    BUN 21 8 - 25 mg/dL    CREATININE 3.66 4.40 - 1.35 mg/dL    BUN/CREA RATIO 19 6 - 22    ESTIMATED GFR - MALE 73 >=60 mL/min/BSA   HEPATIC FUNCTION PANEL   Result Value Ref Range    ALBUMIN 3.8 3.4 - 4.8 g/dL     ALKALINE PHOSPHATASE 66 45 - 115 U/L    ALT (SGPT) 28 10 - 55 U/L    AST (SGOT)  17 8 - 45 U/L    BILIRUBIN TOTAL 0.6 0.3 - 1.3 mg/dL    BILIRUBIN DIRECT 0.2 0.1 - 0.4 mg/dL    PROTEIN TOTAL 6.9 6.0 - 8.0 g/dL   LIPASE   Result Value Ref Range    LIPASE 31 10 - 60 U/L   PTT (PARTIAL THROMBOPLASTIN TIME)   Result Value Ref Range    APTT 31.7 26.0 - 39.0 seconds    Narrative    Therapeutic range for unfractionated heparin is 60-100 seconds.   PT/INR   Result Value Ref Range    PROTHROMBIN TIME 12.5 9.7 - 13.6 seconds    INR 1.06 <=5.00   URINALYSIS, MACROSCOPIC   Result Value Ref Range    COLOR Light Yellow Colorless, Straw, Yellow, Light Yellow    APPEARANCE Clear Clear    SPECIFIC GRAVITY 1.022 >1.005 - <1.030    PH 6.5 >5.0 - <8.0    PROTEIN Not Detected Not Detected mg/dL    GLUCOSE Not Detected Not Detected mg/dL    KETONES Not Detected Not Detected mg/dL    UROBILINOGEN Not Detected Not Detected mg/dL    BILIRUBIN Not Detected Not Detected mg/dL    BLOOD Not Detected Not Detected mg/dL    NITRITE Not Detected Not Detected    LEUKOCYTES Not Detected Not Detected WBCs/uL   CBC WITH DIFF   Result Value Ref Range    WBC 5.4 3.7 - 11.0 x10^3/uL    RBC 4.88 4.50 - 6.10 x10^6/uL    HGB 15.3 13.4 - 17.5 g/dL    HCT 34.7 42.5 - 95.6 %    MCV 93.0 78.0 - 100.0 fL    MCH 31.4 26.0 - 32.0 pg    MCHC 33.7 31.0 - 35.5 g/dL    RDW-CV 38.7 56.4 - 33.2 %    PLATELETS 217 150 - 400 x10^3/uL    MPV 9.6 8.7 - 12.5 fL    NEUTROPHIL % 61.0 %    LYMPHOCYTE % 25.0 %    MONOCYTE % 11.0 %    EOSINOPHIL % 3.0 %    BASOPHIL % 0.0 %    NEUTROPHIL # 3.24 1.50 -  7.70 x10^3/uL    LYMPHOCYTE # 1.37 1.00 - 4.80 x10^3/uL     MONOCYTE # 0.57 0.20 - 1.10 x10^3/uL    EOSINOPHIL # 0.17 <=0.50 x10^3/uL    BASOPHIL # <0.10 <=0.20 x10^3/uL    IMMATURE GRANULOCYTE % 0.0 0.0 - 1.0 %    IMMATURE GRANULOCYTE # <0.10 <0.10 x10^3/uL   BASIC METABOLIC PANEL   Result Value Ref Range    SODIUM 137 136 - 145 mmol/L    POTASSIUM 3.7 3.5 - 5.1 mmol/L    CHLORIDE 107 96 - 111 mmol/L    CO2 TOTAL 24 23 - 31 mmol/L    ANION GAP 6 4 - 13 mmol/L    CALCIUM 8.9 8.6 - 10.3 mg/dL    GLUCOSE 469 65 - 629 mg/dL    BUN 20 8 - 25 mg/dL    CREATININE 5.28 4.13 - 1.35 mg/dL    BUN/CREA RATIO 22 6 - 22    ESTIMATED GFR - MALE 89 >=60 mL/min/BSA   CBC/DIFF    Narrative    The following orders were created for panel order CBC/DIFF.  Procedure                               Abnormality         Status                     ---------                               -----------         ------                     CBC WITH KGMW[[id number]]                                    Final result                 Please view results for these tests on the individual orders.   MAGNESIUM   Result Value Ref Range    MAGNESIUM 1.9 1.8 - 2.6 mg/dL   PHOSPHORUS   Result Value Ref Range    PHOSPHORUS 3.3 2.3 - 4.0 mg/dL   CBC WITH DIFF   Result Value Ref Range    WBC 5.5 3.7 - 11.0 x10^3/uL    RBC 4.62 4.50 - 6.10 x10^6/uL    HGB 14.5 13.4 - 17.5 g/dL    HCT 44.0 34.7 - 42.5 %    MCV 93.5 78.0 - 100.0 fL    MCH 31.4 26.0 - 32.0 pg    MCHC 33.6 31.0 - 35.5 g/dL    RDW-CV 95.6 38.7 - 56.4 %    PLATELETS 190 150 - 400 x10^3/uL    MPV 9.6 8.7 - 12.5 fL    NEUTROPHIL % 59.0 %    LYMPHOCYTE % 29.0 %    MONOCYTE % 9.0 %    EOSINOPHIL % 3.0 %    BASOPHIL % 0.0 %    NEUTROPHIL # 3.18 1.50 - 7.70 x10^3/uL    LYMPHOCYTE # 1.61 1.00 - 4.80 x10^3/uL    MONOCYTE # 0.48 0.20 - 1.10 x10^3/uL    EOSINOPHIL # 0.18 <=0.50 x10^3/uL    BASOPHIL # <0.10 <=0.20 x10^3/uL  IMMATURE GRANULOCYTE % 0.0 0.0 - 1.0 %    IMMATURE GRANULOCYTE # <0.10 <0.10 x10^3/uL            Imaging:    Results for orders placed or performed  during the hospital encounter of 12/29/22 (from the past 24 hour(s))   CT BRAIN WO IV CONTRAST - POSSIBLE STROKE     Status: None    Narrative    History: Stroke. No clinical symptoms are provided.    Unenhanced CT imaging of the brain is performed. This CT scanner is equipped with dose reducing technology. The mAS is automatically adjusted according to patient size for optimal dose reduction.     There are no intra or extra-axial mass lesions, fluid collections, or hematomas. There is no intraparenchymal or subarachnoid hemorrhage. There is prominence of the subarachnoid spaces and ventricular system consistent with diffuse parenchymal volume loss compatible with the patient's age. There is some decreased attenuation within the periventricular white matter likely due to chronic ischemic changes. There is also subtle increased attenuation along the interhemispheric falx highly suggestive of early dural calcification. No focal hyperdensity is seen to suggest subdural hemorrhage. There is a tiny soft tissue calcification near right frontal scalp. Mucosal thickening is noted within the ethmoid air cells and frontal sinus.      Impression    1. Age-appropriate central and cortical atrophy.  2. No acute intracranial process.           Radiologist location ID: [id number]     CTA STROKE PROTOCOL (CTA BRAIN/CTA CAROTIDS W IV CONTRAST)     Status: None    Narrative    Male, 67 years old.    CTA STROKE PROTOCOL (CTA BRAIN/CTA CAROTIDS W IV CONTRAST) performed on 12/29/2022 5:34 PM.    REASON FOR EXAM:  Stroke  RADIATION DOSE: 1560 DLP  CONTRAST: 100 ml's of Isovue 370    TECHNIQUE: Intravenous contrast utilized for study. Volumetric acquisition of the intracranial and extracranial neck arteries. Volume rendered 3-D reconstructions of the intracranial and extracranial neck arteries also obtained. The Viz AI computer software was utilized on this exam to evaluate for large vessel occlusion. This CT scanner is equipped with dose  reducing technology. The exposure is automatically adjusted according to patient body size in order to deliver the lowest dose possible.    COMPARISON: None available.    FINDINGS: Three-vessel type II aortic arch. Bilateral common carotid arteries, bilateral internal carotid arteries, and right dominant vertebral basilar system are patent. Bilateral ACAs, MCAs, and PCAs are patent. Included lungs are without acute abnormality. No acute fracture or traumatic malalignment.      Impression    No large vessel occlusion or flow-limiting stenosis.        Radiologist location ID: [id number]     CT ABDOMEN PELVIS W IV CONTRAST     Status: None    Narrative    History: Abdomen and low back pain status post syncope and fall.    CT imaging of the abdomen and pelvis is performed following IV contrast administration. This CT scanner is equipped with dose reducing technology. The mAS is automatically adjusted according to patient size for optimal dose reduction. Comparison is made to 05/12/2021 and 04/08/2021    Review of the lung bases shows mild emphysematous changes. No acute infiltrates are seen.     Within the abdomen, no acute abnormalities are seen involving the liver, spleen, pancreas, kidneys, and adrenal glands. The gallbladder is contracted.  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    Review of the bowel shows no evidence for obstruction. There is no acute intra-abdominal inflammation. There is a very tiny periumbilical ventral hernia.    Within the pelvis, the bladder appears normal. No free fluid is seen. Postoperative changes are present within the lumbar spine. There is multilevel degenerative disc disease at. No acute fractures are seen. There is an old fracture of the right L3 transverse process.      Impression    1. No acute intra-abdominal process.      Radiologist location ID: [id number]           Microbiology:  No results found for any visits on 12/29/22 (from the past 96  hour(s)).      Disposition Planning:   Home when medically stable    Ignacia Palma, MD  This note was partially generated using MModal Fluency Direct system, and there may be some incorrect words, spellings, and punctuation that were not noted in checking the note before saving.

## 2022-12-30 NOTE — Consults (Signed)
[facility]    NEUROLOGY CONSULT NOTE     Date of Service:  12/30/2022   Sean Wall [id number] y.o. male   Date of Admission:  12/29/2022   Date of Birth:  [birth year]   Chief complaint: Syncope       Consult:  Confusion and slurred speech.    HPI: Sean Wall is a 67 y.o., right-handed man with a known history of HTN, HLD, OSA on CPAP, lumbar disc disease(s/p corticosteroid injections) and cervical disc disease who presents to the ER for further evaluation after being referred [facility]  The patient states that he had got up in the middle of the night to go to the toilet and felt dizzy and lightheaded.  When he was in the toilet he felt that his knees buckled and he fell to the ground with his back on the wall and feels that he may have passed out for about 10 seconds .  He states that he was not sure as to how he was able to get himself up and back to the bed however he went to bed later that night and when he got up in the morning he was able to ambulate without any assistance.  He did check his blood pressure noted that it was running low for the next 2 days ranging in the systolic between 09W and 100s.  A few days prior to this episode he was complaining of feeling lightheaded while ambulating that were get better when he would sit down.  He called his [facility] doctor because of the spell of passing out and he was recommended to come to the ER for further evaluation.  He had not have any associated symptoms such as headache, vision complaints, slurred speech.  He does have chronic neck and low back pain with pain in his right upper extremity in his left lower extremity.  He follows up with Orthopedics and also has received steroid injections.  At his normal baseline he does not require any assistance to ambulate .    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 Venetia Maxon. Right 2018 Dr Aileen Pilot CYST REMOVAL Right 1990    right wrist and right ankle. [employer]    HX LUMBAR FUSION  2018    hardware in place--Dr. Aileen Pilot NOSE/SINUS SURGERY  2005    deviated septum repair. Dr. Bjorn Loser SHOULDER SURGERY Bilateral     5 surgeries on right and 3 on the left    HX TURP  HX VASECTOMY      Dr. Sharyn Blitz HERNIA REPAIR Right 1991    [employer]    LASIK Bilateral 2004      Family:    Family Medical History:       Problem Relation (Age of Onset)    No Known Problems Mother, Father, Sister, Brother, Half-Sister, Half-Brother, Maternal Aunt, Maternal Uncle, Paternal Aunt, Paternal Uncle, Maternal Grandmother, Maternal Grandfather, Paternal Grandmother, Paternal Grandfather, Daughter, Son, Other           Social:   reports that he has been smoking cigars. He has never used smokeless tobacco. He reports that he does not currently use alcohol. He reports that he does not use drugs.   Allergies   Allergen Reactions    Propofol Rash    Lithium      Blurred vision      Medications Prior to Admission       Prescriptions    atorvastatin (LIPITOR) 40 mg Oral Tablet    Take 1 Tablet (40 mg total) by mouth Every evening    azelastine-fluticasone 137-50 mcg/spray Nasal Spray, Non-Aerosol    Administer 2 Sprays into affected nostril(s) Every night as needed Only uses if having a issue    bisacodyL (DULCOLAX) 5 mg Oral Tablet, Delayed Release (E.C.)    Take 2 Tablets (10 mg total) by mouth Every 24 hours as  needed for Constipation for up to 1 dose To be taken in the evening    buPROPion (WELLBUTRIN XL) 300 mg extended release 24 hr tablet    Take 1 Tablet (300 mg total) by mouth Once a day    cetirizine (ZYRTEC) 10 mg Oral Tablet    Take 1 Tablet (10 mg total) by mouth Every night as needed for Allergies    cyanocobalamin (VITAMIN B12) 1,000 mcg/mL Injection Solution    1 mL (1,000 mcg total) Every 30 days Takes on the 21st    cyclobenzaprine (FLEXERIL) 10 mg Oral Tablet    Take 1 Tablet (10 mg total) by mouth Three times a day as needed Usually take at one efery  HS    dextroamphetamine-amphetamine (ADDERALL) 10 mg Oral Tablet    Take 1 Tablet (10 mg total) by mouth Once per day as needed for Other    finasteride (PROSCAR) 5 mg Oral Tablet    Take 1 Tablet (5 mg total) by mouth Once a day    gabapentin (NEURONTIN) 300 mg Oral Capsule    Take 1 Capsule (300 mg total) by mouth Twice daily Takes bid    HERBAL DRUGS ORAL    Take 700 mg by mouth Once a day PEPPERMINT LEAF    HYDROcodone-acetaminophen (NORCO) 5-325 mg Oral Tablet    Take 1 Tablet by mouth Every 8 hours as needed for Pain    HYDROcodone-acetaminophen (NORCO) 5-325 mg Oral Tablet    Take 1-2 Tablets by mouth Every 6 hours as needed for Pain    Ibuprofen (MOTRIN) 600 mg Oral Tablet    Take 1 Tablet (600 mg total) by mouth Four times a day as needed for Pain    lactobacillus comb no.10 (PROBIOTIC) 20 billion cell Oral Capsule    Take 1 Capsule by mouth Once a day    melatonin 3 mg Oral Capsule    Take 2 Capsules (6 mg total) by mouth Every night    metoprolol tartrate (LOPRESSOR) 25 mg Oral Tablet    Take 1 Tablet (25 mg total)  by mouth Every night    pantoprazole (PROTONIX) 20 mg Oral Tablet, Delayed Release (E.C.)    Take 1 Tablet (20 mg total) by mouth Twice daily    QUEtiapine (SEROQUEL) 50 mg Oral Tablet    Take 1 Tablet (50 mg total) by mouth Every night    simethicone (MYLICON) 80 mg Oral Tablet, Chewable    Chew 1 Tablet (80 mg total) Every 6 hours as  needed    sumatriptan succinate (IMITREX) 100 mg Oral Tablet    Take 1 Tablet (100 mg total) by mouth Once, as needed for Migraine May repeat in 2 hours in needed    tadalafil (CIALIS) 10 mg Oral Tablet    Take 1 Tablet (10 mg total) by mouth Every 24 hours as needed    tadalafil (CIALIS) 20 mg Oral Tablet    Take 1 Tablet (20 mg total) by mouth Every 24 hours as needed    tamsulosin (FLOMAX) 0.4 mg Oral Capsule    Take 1 Capsule (0.4 mg total) by mouth Every night    Testosterone 1 % (25 mg/2.5gram) Transdermal Gel in Packet    Place 2 Each on the skin Once a day 2 pumps each shoulder daily am           acetaminophen (TYLENOL) tablet, 500 mg, Oral, Q4H PRN  atorvastatin (LIPITOR) tablet, 40 mg, Oral, QPM  bisacodyl (DULCOLAX) enteric coated tablet, 10 mg, Oral, Q24 H PRN  buPROPion (WELLBUTRIN XL) 24 hr extended release tablet, 300 mg, Oral, Daily  [START ON 01/05/2023] cyanocobalamin (VITAMIN B12) 1000 mcg/mL injection, 1,000 mcg, IntraMUSCULAR, Q30 Days  cyclobenzaprine (FLEXERIL) tablet, 10 mg, Oral, 3x/day PRN  enoxaparin PF (LOVENOX) 40 mg/0.4 mL SubQ injection, 40 mg, Subcutaneous, Q24H  finasteride (PROSCAR) tablet, 5 mg, Oral, Daily  HYDROcodone-acetaminophen (NORCO) 5-325 mg per tablet, 1 Tablet, Oral, Q8H PRN  lactobacillus acidoph-bulgar (FLORANEX) 1 million cell tablet, 1 Tablet, Oral, 2x/day-Food  loratadine (CLARITIN) tablet, 10 mg, Oral, Daily  magnesium hydroxide (MILK OF MAGNESIA) 400mg  per 5mL oral liquid, 15 mL, Oral, Daily PRN  melatonin tablet, 6 mg, Oral, NIGHTLY  [Held by provider] metoprolol tartrate (LOPRESSOR) tablet, 25 mg, Oral, NIGHTLY  NS flush syringe, 3 mL, Intracatheter, Q8HRS  NS flush syringe, 3 mL, Intracatheter, Q1H PRN  ondansetron (ZOFRAN) 2 mg/mL injection, 4 mg, Intravenous, Q6H PRN  pantoprazole (PROTONIX) delayed release tablet, 40 mg, Oral, Daily  QUEtiapine (SEROquel) tablet, 50 mg, Oral, NIGHTLY  simethicone (MYLICON) chewable tablet, 80 mg, Oral, Q6H PRN  SUMAtriptan  (IMITREX) tablet, 100 mg, Oral, Once PRN  tamsulosin (FLOMAX) capsule, 0.4 mg, Oral, NIGHTLY         ROS:   Constitutional:   (x) negative     (  ) findings (describe):   Neuro:    (  ) negative     (x) findings (describe): as per HPI   ENT:   (x) negative     (  ) findings (describe):   Eyes:   (x) negative     (  ) findings (describe):   Cardiovascular:  (x) negative     (  ) findings (describe):   Respiratory:  (x) negative     (  ) findings (describe):   Heme/lymphatic:   (x) negative     (  ) findings (describe):   GI:    (x) negative     (  ) findings (describe):   GU:  (x) negative     (  )  findings (describe):   Musculoskeletal:  (x) negative     (  ) findings (describe):   Integument:   (x) negative     (  ) findings (describe):   Endocrine:  (x) negative     (  ) findings (describe):   Allergy/immunologic:  (x) negative     (  ) findings (describe):   Psych:    (x) negative     (  ) findings (describe):     Review of systems reviewed and negative except as noted in HPI     Physical Exam:   Vitals:    12/29/22 2311 12/30/22 0345 12/30/22 0721 12/30/22 0732   BP: 115/84 (!) 101/58 (!) 86/63 (!) 80/48   Pulse: 74 62 56 57   Resp: 20 20 16 20    Temp:  36.8 C (98.2 F) 36.6 C (97.9 F)    SpO2: 95% 97% 95% 95%   Weight:       Height:       BMI:                Physical Exam:   Appearance: Not in any distress    Heart: Regular rate and rhythm    Chest: Breathing is non-labored, with no accessory muscle use or costal retractions.       Abdomen: The abdomen is soft, non-tender, and non-distended with normative bowel sounds present.     Peripheral Vasculature: Radial pulses are 2+ and bilaterally symmetric.    Skin: No rashes on exposed skin.       Neurological Exam:  Mental Status:  Awake and follows commands  Orientation: alert and orientated to person, place, time, and situation    Attention: Attentive to examiner    Memory: Normal memory    Language:  Language was fluent with intact repetition, naming, and  comprehension   Fund of knowledge: intact     Cranial Nerves Visual Fields (II): Visual fields were normal.   Pupils (II): Pupils were equal and reactive to light. There was no afferent pupillary defect.    Eye Movements (III, IV, VI): Extra-ocular movements were full. Ptosis was absent. There was no nystagmus   Facial Sensation (V): normal.   Facial Strength (VII): symmetric with normal strength   Hearing (VIII): Hearing was intact bilaterally.    IX, X, XI, XII:  Palate movements were normal. Neck strength was normal. There was normal tongue bulk and speed of movement                                                         Muscle tone: WNL  Motor strength:  Motor strength is normal throughout.  Sensory: Sensory exam in the upper and lower extremities is normal vibration testing intact up to the ankles bilaterally  Coordination: Coordination is normal without tremor  Reflexes: Reflexes are 1+ upper extremities bilaterally, absent knee and ankle reflexes.  Plantar reflexes are downgoing bilaterally.  Gait:  able to ambulate independently      OTHER: No tremor or abnormal movements        Neuroimaging:      CT head 12/29/2022.  I independently reviewed the scans.  No signs of acute ischemia.  No acute ICH.    CTA head and neck 12/29/2022.  I independently reviewed the scans.  No signs of proximal intracranial LVO noted.  No significant stenosis/occlusion noted in the extracranial cervical vasculature.    Reviewed the patient's labs.  Creatinine improved from 1.0920.93  Assessment and Recommendations:   Mr Harker is a 67 y.o right-handed man with a known history of HTN, HLD, OSA on CPAP, lumbar disc disease(s/p corticosteroid injections) and cervical disc disease who who presents for further evaluation of a syncopal spell and was noted to have hypotensive blood pressure readings at home.  The patient's syncopal spells appear to be orthostatic in nature and would recommend an orthostatic blood pressure check.  Will  recommend IV fluid hydration.  Would not recommend any further neurological workup at this time.  Can continue to follow up orthopedic surgery after discharge.  I explained my assessment and recommendations in detail to the patient.  He agrees with the plan and all questions and concerns have been answered.  Case discussed with staff.    Please call the Neurology service with any questions  Chad Cordial, MD   This note was partially generated using MModal Fluency Direct system, and there may be some incorrect words, spellings, and punctuation.

## 2022-12-30 NOTE — Care Plan (Signed)
Pt rested well overnight. Respirations unlabored. No distress noted. For neurology consult today. Safety maintained. Lillia Carmel, RN

## 2022-12-31 DIAGNOSIS — G43909 Migraine, unspecified, not intractable, without status migrainosus: Secondary | ICD-10-CM

## 2022-12-31 DIAGNOSIS — Z79899 Other long term (current) drug therapy: Secondary | ICD-10-CM

## 2022-12-31 LAB — CBC WITH DIFF
BASOPHIL #: <0.1 10*3/uL (ref <=0.20)
BASOPHIL %: 0 %
EOSINOPHIL #: 0.19 10*3/uL (ref <=0.50)
EOSINOPHIL %: 3 %
HCT: 44.3 % (ref 38.9–52.0)
HGB: 14.3 g/dL (ref 13.4–17.5)
IMMATURE GRANULOCYTE #: <0.1 10*3/uL (ref <0.10)
IMMATURE GRANULOCYTE %: 0 % (ref 0.0–1.0)
LYMPHOCYTE #: 1.69 10*3/uL (ref 1.00–4.80)
LYMPHOCYTE %: 31 %
MCH: 30.6 pg (ref 26.0–32.0)
MCHC: 32.3 g/dL (ref 31.0–35.5)
MCV: 94.9 fL (ref 78.0–100.0)
MONOCYTE #: 0.55 10*3/uL (ref 0.20–1.10)
MONOCYTE %: 10 %
MPV: 9.7 fL (ref 8.7–12.5)
NEUTROPHIL #: 3.05 10*3/uL (ref 1.50–7.70)
NEUTROPHIL %: 56 %
PLATELETS: 183 10*3/uL (ref 150–400)
RBC: 4.67 10*6/uL (ref 4.50–6.10)
RDW-CV: 13.2 % (ref 11.5–15.5)
WBC: 5.5 10*3/uL (ref 3.7–11.0)

## 2022-12-31 LAB — BASIC METABOLIC PANEL
ANION GAP: 6 mmol/L (ref 4–13)
BUN/CREA RATIO: 15 (ref 6–22)
BUN: 16 mg/dL (ref 8–25)
CALCIUM: 8.9 mg/dL (ref 8.6–10.3)
CHLORIDE: 104 mmol/L (ref 96–111)
CO2 TOTAL: 29 mmol/L (ref 23–31)
CREATININE: 1.09 mg/dL (ref 0.75–1.35)
ESTIMATED GFR - MALE: 73 mL/min/BSA (ref >=60)
GLUCOSE: 85 mg/dL (ref 65–125)
POTASSIUM: 4.2 mmol/L (ref 3.5–5.1)
SODIUM: 139 mmol/L (ref 136–145)

## 2022-12-31 LAB — MAGNESIUM: MAGNESIUM: 1.9 mg/dL (ref 1.8–2.6)

## 2022-12-31 NOTE — Care Plan (Signed)
Problem: Adult Inpatient Plan of Care  Goal: Patient-Specific Goal (Individualized)  Outcome: Ongoing (see interventions/notes)  Flowsheets  Taken 12/31/2022 0432  Patient would like to participate in bedside shift report: No  Individualized Care Needs: BP monitoring  Anxieties, Fears or Concerns: None voiced  Patient-Specific Goals (Include Timeframe): Discharge soon.  Plan of Care Reviewed With: patient  Taken 12/30/2022 2040  Patient would like to participate in bedside shift report: No  Individualized Care Needs: BP monitoring  Anxieties, Fears or Concerns: none voiced  Patient-Specific Goals (Include Timeframe): Discharge soon.  Plan of Care Reviewed With: patient   Patient rested in bed overnight. No events noted. No complaints of pain or dizziness. Call light in reach. Safety measures in place.

## 2022-12-31 NOTE — Nurses Notes (Signed)
Pt and wife educated on medications, follow-up appts and when to seek further medical assistance.  IV d/c'd.  All belongings accounted for.  Opportunity to ask questions provided.  All questions/concerns addressed to pt satisfaction.

## 2022-12-31 NOTE — Care Plan (Signed)
Pt without complaints. Tolerating diet. No c/o dizziness/symptoms. Voiding well. Anxious to go home.      Problem: Adult Inpatient Plan of Care  Goal: Plan of Care Review  Outcome: Adequate for Discharge  Goal: Patient-Specific Goal (Individualized)  Outcome: Adequate for Discharge  Goal: Absence of Hospital-Acquired Illness or Injury  Outcome: Adequate for Discharge  Intervention: Prevent and Manage VTE (Venous Thromboembolism) Risk  Recent Flowsheet Documentation  Taken 12/31/2022 0900 by Kae Heller, RN  VTE Prevention/Management: ambulation promoted  Goal: Optimal Comfort and Wellbeing  Outcome: Adequate for Discharge  Goal: Rounds/Family Conference  Outcome: Adequate for Discharge     Problem: Fall Injury Risk  Goal: Absence of Fall and Fall-Related Injury  Outcome: Adequate for Discharge

## 2023-01-01 ENCOUNTER — Encounter (HOSPITAL_COMMUNITY): Payer: Self-pay | Admitting: Internal Medicine

## 2023-01-01 NOTE — Discharge Summary (Signed)
[facility]  DISCHARGE SUMMARY    PATIENT NAME:  Sean Wall, Sean Wall  MRN:  [id number]  DOB:  1956/05/26  ADMISSION DATE:  12/29/2022  DISCHARGE DATE:  12/31/2022  PRIMARY CARE PHYSICIAN: Marin Shutter, MD           Reason for Admission       Diagnosis    Syncope [106001]             DISCHARGE DIAGNOSES:  Primary hypertension  Hospital Problems    1Primary hypertension         Date Noted: 04/08/2021      History of migraine         Date Noted: 12/29/2022      Chronic pain syndrome         Date Noted: 12/29/2022      Chronic GERD         Date Noted: 04/08/2021      Mood disorder (CMS HCC)         Date Noted: 04/08/2021      Resolved Hospital Problems    Hypotension, unspecified hypotension type         Date Noted: 12/30/2022         Date Resolved: 01/01/2023      Health education/counseling         Date Noted: 12/30/2022         Date Resolved: 01/01/2023      Syncope         Date Noted: 12/29/2022         Date Resolved: 01/01/2023      Epigastric pain         Date Noted: 12/29/2022         Date Resolved: 12/30/2022      Stroke-like symptom         Date Noted: 12/29/2022         Date Resolved: 12/30/2022        HOSPITAL COURSE:    Syncope, likely due to hypotension-resolved  Hypotension in setting primary hypertension-resolved  Stroke-like symptoms, ruled out  Neurology consulted/following.  No further neurological workup needed.  Orthostatic vital signs: WNL.  Initially, patient vital signs show low blood pressure and home metoprolol/tamsulosin was held and patient was observed overnight.  Patient's blood pressure significantly improved: 120s over 80s.  Patient was advised to continue to hold metoprolol/tamsulosin post discharge and to follow up with PCP when or if to safely resume these medications.  Patient was subsequently discharged.     Other chronic comorbidities  Continue home medication as prescribed unless stated below.      Physical Exam  Vitals and nursing note reviewed.   Constitutional:        General: He is not in acute distress.     Appearance: He is not ill-appearing.   HENT:      Head: Normocephalic and atraumatic.      Nose: Nose normal.      Mouth/Throat:      Pharynx: Oropharynx is clear.   Eyes:      Extraocular Movements: Extraocular movements intact.      Conjunctiva/sclera: Conjunctivae normal.   Neck:      Vascular: No JVD.   Cardiovascular:      Rate and Rhythm: Normal rate and regular rhythm.   Pulmonary:      Effort: Pulmonary effort is normal. No respiratory distress.      Breath sounds: Normal breath sounds.   Abdominal:  General: Bowel sounds are normal.      Palpations: Abdomen is soft.   Genitourinary:     Comments: Defer  Musculoskeletal:         General: Normal range of motion.      Cervical back: Normal range of motion.   Neurological:      General: No focal deficit present.      Mental Status: He is alert and oriented to person, place, and time. Mental status is at baseline.   Psychiatric:         Mood and Affect: Mood and affect normal.         Behavior: Behavior normal.         Thought Content: Thought content normal.       DISCHARGE MEDICATIONS:     Current Discharge Medication List        CONTINUE these medications - NO CHANGES were made during your visit.        Details   atorvastatin 40 mg Tablet  Commonly known as: LIPITOR   40 mg, Oral, EVERY EVENING  Refills: 0     azelastine-fluticasone 137-50 mcg/spray Spray, Non-Aerosol   2 Sprays, Nasal, NIGHTLY PRN, Only uses if having a issue  Refills: 0     bisacodyL 5 mg Tablet, Delayed Release (E.C.)  Commonly known as: DULCOLAX   10 mg, Oral, EVERY 24 HOURS PRN, To be taken in the evening  Qty: 2 Tablet  Refills: 0     buPROPion 300 mg Tablet Extended Release 24 hr  Commonly known as: WELLBUTRIN XL   300 mg, Oral, DAILY  Refills: 0     cetirizine 10 mg Tablet  Commonly known as: zyrTEC   10 mg, Oral, NIGHTLY PRN  Refills: 0     cyanocobalamin 1,000 mcg/mL Solution  Commonly known as: VITAMIN B12   1,000 mcg, EVERY 30 DAYS,  Takes on the 21st  Refills: 0     cyclobenzaprine 10 mg Tablet  Commonly known as: FLEXERIL   10 mg, Oral, 3 TIMES DAILY PRN, Usually take at one efery  HS  Refills: 0     dextroamphetamine-amphetamine 10 mg Tablet  Commonly known as: ADDERALL   10 mg, Oral, DAILY PRN  Refills: 0     finasteride 5 mg Tablet  Commonly known as: PROSCAR   5 mg, Oral, DAILY  Refills: 0     gabapentin 300 mg Capsule  Commonly known as: NEURONTIN   300 mg, Oral, 2 TIMES DAILY, Takes bid  Refills: 0     HERBAL DRUGS ORAL   700 mg, Oral, DAILY, PEPPERMINT LEAF  Refills: 0     * HYDROcodone-acetaminophen 5-325 mg Tablet  Commonly known as: NORCO   1 Tablet, Oral, EVERY 8 HOURS PRN  Refills: 0     * HYDROcodone-acetaminophen 5-325 mg Tablet  Commonly known as: NORCO   1-2 Tablets, Oral, EVERY 6 HOURS PRN  Qty: 20 Tablet  Refills: 0     Ibuprofen 600 mg Tablet  Commonly known as: MOTRIN   600 mg, Oral, 4 TIMES DAILY PRN  Refills: 0     melatonin 3 mg Capsule   6 mg, Oral, NIGHTLY  Refills: 0     pantoprazole 20 mg Tablet, Delayed Release (E.C.)  Commonly known as: PROTONIX   20 mg, Oral, 2 TIMES DAILY  Refills: 0     Probiotic 20 billion cell Capsule  Generic drug: lactobacillus comb no.10   1 Capsule, Oral, DAILY  Refills:  0     QUEtiapine 50 mg Tablet  Commonly known as: SEROquel   50 mg, Oral, NIGHTLY  Refills: 0     simethicone 80 mg Tablet, Chewable  Commonly known as: MYLICON   80 mg, Oral, EVERY 6 HOURS PRN  Refills: 0     sumatriptan succinate 100 mg Tablet  Commonly known as: IMITREX   100 mg, Oral, ONCE PRN, May repeat in 2 hours in needed  Refills: 0     * tadalafil 10 mg Tablet  Commonly known as: CIALIS   10 mg, Oral, EVERY 24 HOURS PRN  Qty: 30 Tablet  Refills: 11     * tadalafil 20 mg Tablet  Commonly known as: CIALIS   20 mg, Oral, EVERY 24 HOURS PRN  Qty: 30 Tablet  Refills: 4     Testosterone 1 % (25 mg/2.5gram) Gel in Packet   2 Each, Transdermal, DAILY, 2 pumps each shoulder daily am  Refills: 0           * This list has 4  medication(s) that are the same as other medications prescribed for you. Read the directions carefully, and ask your doctor or other care provider to review them with you.                STOP taking these medications.      metoprolol tartrate 25 mg Tablet  Commonly known as: LOPRESSOR     tamsulosin 0.4 mg Capsule  Commonly known as: FLOMAX              DISCHARGE INSTRUCTIONS:  Activity:  As tolerated  Diet:  Low-fat/low-cholesterol  Follow-up with PCP  Total discharge time:  35 minutes    Ignacia Palma, MD    Referring providers can utilize [email or web address] to access their referred [facility] patient's information.

## 2023-01-10 ENCOUNTER — Other Ambulatory Visit: Payer: Self-pay

## 2023-01-10 ENCOUNTER — Encounter (HOSPITAL_BASED_OUTPATIENT_CLINIC_OR_DEPARTMENT_OTHER): Payer: Self-pay | Admitting: Neurological Surgery

## 2023-01-10 ENCOUNTER — Ambulatory Visit: Payer: 59 | Attending: Neurological Surgery | Admitting: PHYSICIAN ASSISTANT

## 2023-01-10 DIAGNOSIS — M5416 Radiculopathy, lumbar region: Secondary | ICD-10-CM | POA: Insufficient documentation

## 2023-01-10 DIAGNOSIS — Z981 Arthrodesis status: Secondary | ICD-10-CM | POA: Insufficient documentation

## 2023-01-10 DIAGNOSIS — M4726 Other spondylosis with radiculopathy, lumbar region: Secondary | ICD-10-CM

## 2023-01-10 DIAGNOSIS — M47816 Spondylosis without myelopathy or radiculopathy, lumbar region: Secondary | ICD-10-CM | POA: Insufficient documentation

## 2023-01-10 DIAGNOSIS — M545 Low back pain, unspecified: Secondary | ICD-10-CM | POA: Insufficient documentation

## 2023-01-10 NOTE — Progress Notes (Signed)
Department of Neurosurgery  Outpatient Follow up    Name:  Sean Wall      MR#:    [id number]   DOB:  September 23, 1955     Date/Time: 01/10/2023, 16:22  ______________________________________________________________________    Assessment/Plan:     ICD-10-CM    1. Lumbar radiculopathy  M54.16 [facility] INJECTIONS      2. History of lumbar fusion  Z98.1       3. Lumbar spondylosis  M47.816       4. Low back pain  M54.50            Last Visit 06/28/22: Patient being evaluated via telehealth to reassess symptoms after undergoing C5-6 CESI and review lumbar MRI. Patient reports mild-moderate relief of symptoms after undergoing CESI. He states he will occasionally experience symptoms extending down his right arm/hand. He states his main concern in his lower back and left anterior leg pain. 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 He has not undergone injectio therapy for lumbar spine. Pertinent negatives include bowel/bladder incontinence, saddle paresthesia, and gait instability. He denies any other questions or concerns at this time.     Current Visit:  Patient returns to clinic with updated MRI imaging of the lumbar spine.  No acute processes noted.  There was no evidence of high-grade central or foraminal stenosis.  The patient does report ongoing pain in the lateral left lower extremity that rotates anteriorly to the knee and slightly below the knee.  He also has ongoing low back discomfort.  The patient otherwise denies any new focal deficits.  He denies sensory changes to bowel or bladder.     Plan:  We will refer the patient back to pain clinic for left L3-4 TFESI.  If the patient does not have improvement with the injection patient will need to return to the pain clinic for consideration of a spinal cord stimulator trial.  For now the patient will follow up with [place] on an as-needed basis.  He will call if he has any issues or  concerns    The patient's exam and radiographic studies were reviewed and discussed with my attending and he is in agreement with the outlined plan.     [place] Arris Meyn, APRN,NP-C  Department of Neurosurgery   01/10/2023 4:22 PM   _______________________________________________________________________    Chief Complaint:  Back pain and Leg pain    History of Present Illness:  Sean Wall  is a 67 y.o. male who returns to the Neurosurgery Clinc for a follow up appointment.    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 Venetia Maxon. Right 2018 Dr Aileen Pilot CYST REMOVAL Right 1990    right wrist and right ankle. [employer]    HX LUMBAR FUSION  2018    hardware in place--Dr. Aileen Pilot NOSE/SINUS SURGERY  2005    deviated septum repair. Dr. Bjorn Loser SHOULDER SURGERY Bilateral     5 surgeries on right and 3 on the left    HX TURP      HX VASECTOMY      Dr. Sharyn Blitz HERNIA REPAIR Right 1991    [employer]    LASIK Bilateral 2004            Family History:  Family Medical History:       Problem Relation (Age of Onset)    No Known  Problems Mother, Father, Sister, Brother, Half-Sister, Half-Brother, Maternal Aunt, Maternal Uncle, Paternal Aunt, Paternal Uncle, Maternal Grandmother, Maternal Grandfather, Paternal Grandmother, Paternal Grandfather, Daughter, Son, Other              Allergies:  Allergies   Allergen Reactions    Propofol Rash    Lithium      Blurred vision       Home Medications:  Prior to Admission medications    Medication Sig Start Date End Date Taking? Authorizing Provider   atorvastatin (LIPITOR) 40 mg Oral Tablet Take 1 Tablet (40 mg total) by mouth Every evening   Yes Provider, Historical   azelastine-fluticasone 137-50 mcg/spray Nasal Spray, Non-Aerosol Administer 2 Sprays into affected nostril(s) Every night as needed Only uses if having a issue   Yes Provider, Historical   bisacodyL (DULCOLAX) 5 mg Oral Tablet, Delayed Release (E.C.) Take 2 Tablets (10 mg total) by mouth Every 24 hours as needed for Constipation for up to 1 dose To be taken in the evening 02/07/22  Yes Brayton Mars, MD   buPROPion (WELLBUTRIN XL) 300 mg extended release 24 hr tablet Take 1 Tablet (300 mg total) by mouth Once a day   Yes Provider, Historical   cetirizine (ZYRTEC) 10 mg Oral Tablet Take 1 Tablet (10 mg total) by mouth Every night as needed for Allergies    Provider, Historical   cyanocobalamin (VITAMIN B12) 1,000 mcg/mL Injection Solution 1 mL (1,000 mcg total) Every 30 days Takes on the 21st   Yes Provider, Historical   cyclobenzaprine (FLEXERIL) 10 mg Oral Tablet Take 1 Tablet (10 mg total) by mouth Three times a day as needed Usually take at one efery  HS   Yes Provider, Historical   dextroamphetamine-amphetamine (ADDERALL) 10 mg Oral Tablet Take 1 Tablet (10 mg total) by mouth Once per day as needed for Other   Yes Provider, Historical   finasteride (PROSCAR) 5 mg Oral Tablet Take 1 Tablet (5 mg total) by mouth Once a day   Yes Provider, Historical   gabapentin (NEURONTIN) 300 mg Oral Capsule Take 1 Capsule (300 mg total) by mouth  Twice daily Takes bid   Yes Provider, Historical   HERBAL DRUGS ORAL Take 700 mg by mouth Once a day PEPPERMINT LEAF  Yes Provider, Historical   HYDROcodone-acetaminophen (NORCO) 5-325 mg Oral Tablet Take 1 Tablet by mouth Every 8 hours as needed for Pain   Yes Provider, Historical   HYDROcodone-acetaminophen (NORCO) 5-325 mg Oral Tablet Take 1-2 Tablets by mouth Every 6 hours as needed for Pain  Patient not taking: Reported on 01/10/2023 03/28/22   Westley Hummer, MD   Ibuprofen (MOTRIN) 600 mg Oral Tablet Take 1 Tablet (600 mg total) by mouth Four times a day as needed for Pain  Patient not taking: Reported on 01/10/2023    Provider, Historical   lactobacillus comb no.10 (PROBIOTIC) 20 billion cell Oral Capsule Take 1 Capsule by mouth Once a day   Yes Provider, Historical   melatonin 3 mg Oral Capsule Take 2 Capsules (6 mg total) by mouth Every night   Yes Provider, Historical   pantoprazole (PROTONIX) 20 mg Oral Tablet, Delayed Release (E.C.) Take 1 Tablet (20 mg total) by mouth Twice daily   Yes Provider, Historical   QUEtiapine (SEROQUEL) 50 mg Oral Tablet Take 1 Tablet (50 mg total) by mouth Every night   Yes Provider, Historical   simethicone (MYLICON) 80 mg Oral Tablet, Chewable Chew 1 Tablet (80 mg total) Every 6 hours as needed   Yes Provider, Historical   sumatriptan succinate (IMITREX) 100 mg Oral Tablet Take 1 Tablet (100 mg total) by mouth Once, as needed for Migraine May repeat in 2 hours in needed    Provider, Historical   tadalafil (CIALIS) 10 mg Oral Tablet Take 1 Tablet (10 mg total) by mouth Every 24 hours as needed 02/09/22 02/09/23 Yes Dice, Franky Macho, PA-C   tadalafil (CIALIS) 20 mg Oral Tablet Take 1 Tablet (20 mg total) by mouth Every 24 hours as needed 03/10/22  Yes Andres Shad, MD   Testosterone 1 % (25 mg/2.5gram) Transdermal Gel in Packet Place 2 Each on the skin Once a day 2 pumps each shoulder daily am   Yes Provider, Historical   ferrous sulfate (FERATAB) 324 mg (65 mg iron) Oral  Tablet, Delayed Release (E.C.) Take 1 Tablet (324 mg total) by mouth Every other day On Monday, Wednesday, friday  Patient not taking: Reported on 10/19/2022  12/29/22  Provider, Historical   metoprolol tartrate (LOPRESSOR) 25 mg Oral Tablet Take 1 Tablet (25 mg total) by mouth Every night  12/31/22  Provider, Historical   predniSONE (DELTASONE) 10 mg Oral Tablet 4 TABLETS QAM X 3 DAYS, THEN 3 TABLETS QAM X 3 DAYS, THEN 2 TABLETS QAM X 3 DAYS, THEN 1 TABLET QAM X 3 DAYS  Patient not taking: Reported on 10/25/2022 03/28/22 12/29/22  Westley Hummer, MD   tamsulosin [facility]) 0.4 mg Oral Capsule Take 1 Capsule (0.4 mg total) by mouth Every night  12/31/22  Provider, Historical       Social History:   Social History     Tobacco Use    Smoking status: Light Smoker     Types: Cigars    Smokeless tobacco: Never    Tobacco comments:     OCCASIONAL SMOKING-1 CIGAR EVERY 2 WEEKS   Vaping Use    Vaping status: Never Used   Substance Use Topics    Alcohol use: Not Currently     Comment: none in the past month    Drug use: Never        Review of Systems:  Constitutional: no weight loss, fever, night sweats  GI: No change in bowel habits, No Nausea, vomitting, diarrhea, or constipation  GU: Negative for dysuria, frequency and incontinence  Neuro: negative    Physical Exam:  BP 131/74   Pulse 74   Temp 36.4 C (97.5 F)   Resp 16   Ht 1.778 m (5\' 10" )   Wt 95.1 kg (209 lb 10.5 oz)   SpO2 99%   BMI 30.08 kg/m       Lumbar Spine:   Palpation:  Tenderness along the paraspinal musculature  ROM:  Range of motion is limited by pain    Neurologic Exam:     Alert and oriented x3 with normal speech.    Sensation:  Intact to light touch, Intact to proprioception, Intact to pain in lower extremities.    Gait: Normal    Motor examination: MAE against gravity       Imaging:  I reviewed the study(ies) myself and made my own interpretation.  My interpretation can be found in the HPI and/or Assessment.      All portions of the chart was  reviewed, including those that are automatically propagated. Any history that could not be obtained by the patient was done so through chart review. This note was partially generated using MModal Fluency Direct system, and there may be some incorrect words, spellings, and punctuation that were not noted in checking the note before saving.

## 2023-01-18 ENCOUNTER — Ambulatory Visit (HOSPITAL_BASED_OUTPATIENT_CLINIC_OR_DEPARTMENT_OTHER): Payer: Self-pay | Admitting: Pain Medicine

## 2023-01-18 NOTE — Telephone Encounter (Signed)
SPOKE TO PATIENT TODAY and we scheduled TFESI for 01/23/23 @ 1030 AM. Patient is to be there @ 930 AM.  I confirmed patients insurance and verbally went over all instructions with the patient as well as printed them and gave them/mailed them to the patient. We also went over the medications that need held which are MOTRIN for 4 days.  All questions were answered. The patient voiced understanding.   Sean Wall

## 2023-01-25 ENCOUNTER — Other Ambulatory Visit (HOSPITAL_BASED_OUTPATIENT_CLINIC_OR_DEPARTMENT_OTHER): Payer: Self-pay | Admitting: Pain Medicine

## 2023-01-25 ENCOUNTER — Inpatient Hospital Stay (HOSPITAL_COMMUNITY)
Admission: RE | Admit: 2023-01-25 | Discharge: 2023-01-25 | Disposition: A | Discharge status: Home / Self Care | Payer: 59 | Source: Ambulatory Visit

## 2023-01-25 ENCOUNTER — Encounter (HOSPITAL_COMMUNITY): Payer: 59 | Admitting: Pain Medicine

## 2023-01-25 ENCOUNTER — Encounter (HOSPITAL_COMMUNITY)
Admission: RE | Disposition: A | Discharge status: Home / Self Care | Payer: Self-pay | Source: Ambulatory Visit | Attending: Pain Medicine

## 2023-01-25 ENCOUNTER — Other Ambulatory Visit: Payer: Self-pay

## 2023-01-25 ENCOUNTER — Encounter (HOSPITAL_COMMUNITY): Payer: Self-pay | Admitting: Pain Medicine

## 2023-01-25 ENCOUNTER — Inpatient Hospital Stay
Admission: RE | Admit: 2023-01-25 | Discharge: 2023-01-25 | Disposition: A | Discharge status: Home / Self Care | Payer: 59 | Source: Ambulatory Visit | Attending: Pain Medicine | Admitting: Pain Medicine

## 2023-01-25 DIAGNOSIS — M4726 Other spondylosis with radiculopathy, lumbar region: Secondary | ICD-10-CM | POA: Insufficient documentation

## 2023-01-25 DIAGNOSIS — M549 Dorsalgia, unspecified: Secondary | ICD-10-CM

## 2023-01-25 SURGERY — ~~LOC~~ OR MINOR EPIDURAL STEROID INJECTION
Anesthesia: Local (Nurse-Monitored) | Laterality: Left | Wound class: Clean Wound: Uninfected operative wounds in which no inflammation occurred

## 2023-01-25 MED ORDER — LIDOCAINE HCL 10 MG/ML (1 %) INJECTION SOLUTION
Freq: Once | INTRAMUSCULAR | Status: DC | PRN
Start: 2023-01-25 — End: 2023-01-25
  Administered 2023-01-25: 30 mL via INTRAMUSCULAR

## 2023-01-25 MED ORDER — IOPAMIDOL 200 MG IODINE/ML (41 %) INTRATHECAL SOLUTION
Freq: Once | INTRATHECAL | Status: DC | PRN
Start: 2023-01-25 — End: 2023-01-25
  Administered 2023-01-25 (×2): 3 mL via INTRAMUSCULAR

## 2023-01-25 MED ORDER — BUPIVACAINE HCL 0.5 % (5 MG/ML) INJECTION SOLUTION
Freq: Once | INTRAMUSCULAR | Status: DC | PRN
Start: 2023-01-25 — End: 2023-01-25
  Administered 2023-01-25: 30 mL via INTRAMUSCULAR

## 2023-01-25 MED ORDER — DEXAMETHASONE SODIUM PHOSPHATE 10 MG/ML INJECTION SOLUTION
Freq: Once | INTRAMUSCULAR | Status: DC | PRN
Start: 2023-01-25 — End: 2023-01-25
  Administered 2023-01-25: 20 mg via INTRAMUSCULAR

## 2023-01-25 MED ORDER — IOPAMIDOL 200 MG IODINE/ML (41 %) INTRATHECAL SOLUTION
3.0000 mL | INTRATHECAL | Status: AC
Start: 2023-01-25 — End: 2023-01-25
  Administered 2023-01-25: 3 mL via INTRA_ARTICULAR

## 2023-01-25 SURGICAL SUPPLY — 10 items
APPL ISPRP CHG 10.5ML CHLRPRP HI-LT ORNG PREP STRL LF (MED SURG SUPPLIES) ×1 IMPLANT
BAG 36X36IN BAND EQP (DRAPE/PACKS/SHEETS/OR TOWEL) IMPLANT
CONV USE 163322 - SYRINGE 20ML LF  STRL LL MED (MED SURG SUPPLIES) IMPLANT
CONV USE ITEM 321827 - GLOVE SURG 7.5 LF PF CLS STRL_BRN TINT 12IN PROTEXIS (GLOVES AND ACCESSORIES) ×1 IMPLANT
DISC NO SUB - TRAY EPIDRL 28X22IN 18GA 27GA LL TUOHY 1.25IN ASST VOL OVAL 10% PVP IOD PLASTIC 1 SHOT NEEDLE STICK (MED SURG SUPPLIES) IMPLANT
NEEDLE EPIDRL YW 4.5IN 20GA TUOHY METAL PLASTIC BVL STY REM WNG SLIDE DEPTH INDICATOR STRL LF  DISP (MED SURG SUPPLIES) IMPLANT
NEEDLE SPINAL 3.5IN 22GA SPNCN QUINCKE BPA PVC FREE DEHP-FR STRL LF (MED SURG SUPPLIES) IMPLANT
NEEDLE SPINAL BLK 5IN 22GA QUINCKE LONG LGTH REG WL POLYPROP STRL LF  DISP (MED SURG SUPPLIES) ×2 IMPLANT
NERVE BLOCK TRAY UNIV STRL LF  DISP (MED SURG SUPPLIES) ×1
TRAY UNIV NERVE BLOCK DISP SNG USE LF STRL (MED SURG SUPPLIES) ×1 IMPLANT

## 2023-01-25 NOTE — OR Surgeon (Signed)
PATIENT NAME:  Sean Wall    MR#:  [id number]    DATE OF BIRTH:  [birth year]    DATE OF PROCEDURE:  01/25/23    PROCEDURE:  Left L3-4 and L4-5 transforaminal epidural steroid injection under fluoroscopic guidance.    PERFORMING PHYSICIAN:  Lisbeth Renshaw, DO    PRE-PROCEDURE DIAGNOSIS:  Lumbar spondylosis, Lumbar radiculopathy, Low back pain    POST-PROCEDURE DIAGNOSIS:  Diagnosis unchanged    ANESTHESIA:  Local    ESTIMATED BLOOD LOSS:  Minimal    COMPLICATIONS:  None    CONSENT:  Today's procedure, its potential benefits as well as its risks and potential side effects were reviewed.  Discussed risks of the procedure include bleeding, infection, nerve irritation or damage, reactions medications administered, headache, failure of the pain to improve, and exacerbation of the pain.  These risks were explained to the patient, who verbalized understanding and who wished to proceed.  Informed consent was signed.    DESCRIPTION OF PROCEDURE:  After written informed consent was obtained, the patient was taken to the fluoroscopy suite and placed in the prone position.  Anatomical landmarks were identified by way of fluoroscopy in multiple views.  The skin of the lumbar region was prepped using antiseptic and draped in the usual sterile fashion.  Strict aseptic technique was utilized.  The skin and subcutaneous tissues at the needle entry site were infiltrated with a total of 5 mL of 1% preservative-free lidocaine using 25 gauge 1-1/2-inch needle.      A 22-gauge needle was then incrementally advanced under fluoroscopic guidance in the oblique view into the left L3-4 neural foramen.   Proper placement into the neural foramen was confirmed with fluoroscopy in both the lateral and AP views.  After negative aspiration for CSF or heme, Isovue 200 M contrast was injected with delineated the nerve root and the epidural space under fluoroscopy in the AP view.  There was only a transient pressure paresthesia that resolved immediately  upon injection.  After negative aspiration, a 2.7 mL injectate consisting of 2 mL of preservative-free 0.5% bupivacaine and 0.7 mL of dexamethasone 10 mg/ml  was slowly injected.       Attention was then directed to the left L4-5 neural foramen.  A 22-gauge needle was then incrementally advanced under fluoroscopic guidance in the oblique view into the neural foramen.   Proper placement into the neural foramen was confirmed with fluoroscopy in both the lateral and AP views.  After negative aspiration for CSF or heme, Isovue 200 M contrast was injected with delineated the nerve root and the epidural space under fluoroscopy in the AP view.  There was only a transient pressure paresthesia that resolved immediately upon injection.  After negative aspiration, a 2.7 mL injectate consisting of 2 mL of preservative-free 0.5% bupivacaine and 0.7 mL of dexamethasone 10 mg/ml  was slowly injected.       The patient tolerated the procedure well and all needles were removed intact.  Hemostasis was maintained.  There were no apparent paresthesias or complications.  The skin was wiped clean, and a Band-Aid was placed as appropriate.  The patient was monitored for an appropriate period of time following the procedure and remained hemodynamically stable and neurovascularly intact.  The patient was ultimately discharged to home with supervision in good condition and instructed to follow up in the office in 14 days or sooner as warranted.     Lisbeth Renshaw, DO  01/25/2023, 15:39

## 2023-01-25 NOTE — Nurses Notes (Signed)
[employer] Pain Rating Scale     On a scale of 0-10, during the past 24 hours, pain has interfered with you usual activity: 5     On a scale of 0-10, during the past 24 hours, pain has interfered with your sleep: 5    On a scale of 0-10, during the past 24 hours, pain has affected your mood: 0     On a scale of 0-10, during the past 24 hours, pain has contributed to your stress: 5     On a scale of 0-10, what is your overall pain Rating: 5

## 2023-01-25 NOTE — Nurses Notes (Signed)
Meds on field, see MD notes for amounts used.

## 2023-01-25 NOTE — Discharge Instructions (Signed)
[facility]  AT [address] - [place], [place] [address] - Phone: ([phone])      Complications (Reasons to call):  . Temperature greater than 101 degrees  . Unusual redness or swelling at the injection site  . Persistent nausea or vomiting or headache  . Persistent weakness, numbness or bleeding  . Loss of bowel or bladder control  . If you are unable to reach your physician and your symptoms are severe, have yourself brought to the nearest Emergency Department or call 911    You may experience:  . You may also experience a temporary increase in the level of your pain  . You may experience weakness, tingling or heavy feelings in your legs the first few hours after your procedure, requiring you to be cautious when ambulating (walking).   . Do NOT drive a car or operate heavy machinery for 24 hours after your procedure    Medications:  Most medications held for your procedure may be resumed one day after the procedure. Please ask your physician if you have questions about specific medications or the procedure itself.    Comfort measures:  . You may use ice over the injection site  . AVOID HEAT for the first 24 hours  . After that ice or heat may be used as needed. DO NOT apply LONGER than 20 minutes and wait 20 minutes before reapplication  . Avoid sitting in a bathtub, hot tub, pool, etc. for 3 days after your procedure    Activity:  . Rest at home for the next 24 hours  . Then increase activity as tolerated  . Typically it is ok to return to work one day after the procedure      It is recommended that you do not receive any vaccinations 7 days prior to a pain clinic procedure, or 7 days after a pain clinic procedure. Side effects from the vaccine may be similar to those that you might experience as an adverse reaction to a pain clinic procedure.

## 2023-01-27 ENCOUNTER — Telehealth (HOSPITAL_BASED_OUTPATIENT_CLINIC_OR_DEPARTMENT_OTHER): Payer: Self-pay | Admitting: Pain Medicine

## 2023-01-27 NOTE — Telephone Encounter (Signed)
Called to see how patient was doing after their Transframinal Epidural Steroid injection on 01/25/23 with Dr. Courtney Heys, and what percent of relief they had. No answer, left message to return our call  Kellie Simmering, MA

## 2023-03-01 ENCOUNTER — Ambulatory Visit: Payer: Commercial Managed Care - PPO | Attending: Urology | Admitting: Urology

## 2023-03-01 ENCOUNTER — Other Ambulatory Visit: Payer: Self-pay

## 2023-03-01 ENCOUNTER — Encounter (INDEPENDENT_AMBULATORY_CARE_PROVIDER_SITE_OTHER): Payer: Self-pay | Admitting: Urology

## 2023-03-01 VITALS — BP 129/91 | HR 66 | Temp 97.5°F | Ht 70.0 in | Wt 207.5 lb

## 2023-03-01 DIAGNOSIS — N401 Enlarged prostate with lower urinary tract symptoms: Secondary | ICD-10-CM

## 2023-03-01 DIAGNOSIS — R3915 Urgency of urination: Secondary | ICD-10-CM

## 2023-03-01 DIAGNOSIS — N138 Other obstructive and reflux uropathy: Secondary | ICD-10-CM

## 2023-03-01 DIAGNOSIS — N5314 Retrograde ejaculation: Secondary | ICD-10-CM

## 2023-03-01 DIAGNOSIS — N529 Male erectile dysfunction, unspecified: Secondary | ICD-10-CM | POA: Insufficient documentation

## 2023-03-01 MED ORDER — TADALAFIL 20 MG TABLET
20.0000 mg | ORAL_TABLET | ORAL | 3 refills | Status: DC | PRN
Start: 2023-03-01 — End: 2023-11-29

## 2023-03-01 NOTE — Progress Notes (Signed)
NAME :Sean Wall  AGE: 67 y.o.  DATE: 03/01/2023  SERVICE: Urology    Chief Complaint   Patient presents with    Erectile Dysfunction     climacturia       HPI    03/01/2023:  Patient returns to clinic today to discuss climbing sure and ED.  Patient states that he is using Cialis with adequate results.  Reports he had a hard time breaking these tablets so he has been taking the 20 mg dose as needed however he reports that he has not been sexually active frequently due to back pain.  Patient reports that his climacturia is not bothersome and that he usually pees prior to sex.    (03/10/22):  Patient is now 6 weeks status post cystoscopy cystometrogram.  At the time he was found to have normal capacity bladder with postvoid residual of 0 cc.  He was noted to have an enlarged median lobe however prostatic urethra was open status post transurethral resection.  He continues to have urinary urgency. He is still experiencing climacturia with retrograde ejaculation although this improves when he empties his bladder prior to sexual activity.      (12/08/21):  This is a 67 y.o. male here for evaluation of ejaculatory dysfunction.  He gives a history of TURP in 2005 or 2006.  He states that this was a formal resection but he continued to have normal ejaculation.  He then began having some difficulties with ejaculation around 2016.  He now has rare ejaculations or orgasm.  He does describe some recent leakage of urine with climax.  He describes good quality erections.  He does relate that he was started on finasteride and tamsulosin about 2 years ago primarily for nocturia.  He has stopped Flomax and anejaculation persists  He states he also suffers from urinary urgency, sometimes leaking urine prior to making it to the bathroom on time.  He denies hematuria or dysuria.            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 Venetia Maxon. Right 2018 Dr Aileen Pilot CYST REMOVAL Right 1990    right wrist and right ankle. [employer]    HX LUMBAR FUSION  2018    hardware in place--Dr. Aileen Pilot NOSE/SINUS SURGERY  2005    deviated septum repair. Dr. Annalee Genta  HX SHOULDER SURGERY Bilateral     5 surgeries on right and 3 on the left    HX TURP      HX VASECTOMY      Dr. Sharyn Blitz HERNIA REPAIR Right 1991    [employer]    LASIK Bilateral 2004             Allergies  Allergies   Allergen Reactions    Propofol Rash    Lithium      Blurred vision         Medications  atorvastatin (LIPITOR) 40 mg Oral Tablet, Take 1 Tablet (40 mg total) by mouth Every evening  azelastine-fluticasone 137-50 mcg/spray Nasal Spray, Non-Aerosol, Administer 2 Sprays into affected nostril(s) Every night as needed Only uses if having a issue  bisacodyL (DULCOLAX) 5 mg Oral Tablet, Delayed Release (E.C.), Take 2 Tablets (10 mg total) by mouth Every 24 hours as needed for Constipation for up to 1 dose To be taken in the evening  buPROPion (WELLBUTRIN XL) 300 mg extended release 24 hr tablet, Take 1 Tablet (300 mg total) by mouth Once a day  cetirizine (ZYRTEC) 10 mg Oral Tablet, Take 1 Tablet (10 mg total) by mouth Every night as needed for Allergies  cyanocobalamin  (VITAMIN B12) 1,000 mcg/mL Injection Solution, 1 mL (1,000 mcg total) Every 30 days Takes on the 21st  cyclobenzaprine (FLEXERIL) 10 mg Oral Tablet, Take 1 Tablet (10 mg total) by mouth Three times a day as needed Usually take at one efery  HS  dextroamphetamine-amphetamine (ADDERALL) 10 mg Oral Tablet, Take 1 Tablet (10 mg total) by mouth Once per day as needed for Other  finasteride (PROSCAR) 5 mg Oral Tablet, Take 1 Tablet (5 mg total) by mouth Once a day  gabapentin (NEURONTIN) 300 mg Oral Capsule, Take 1 Capsule (300 mg total) by mouth Twice daily Takes bid  HERBAL DRUGS ORAL, Take 700 mg by mouth Once a day PEPPERMINT LEAF  HYDROcodone-acetaminophen (NORCO) 5-325 mg Oral Tablet, Take 1 Tablet by mouth Every 8 hours as needed for Pain  HYDROcodone-acetaminophen (NORCO) 5-325 mg Oral Tablet, Take 1-2 Tablets by mouth Every 6 hours as needed for Pain (Patient not taking: Reported on 01/10/2023)  Ibuprofen (MOTRIN) 600 mg Oral Tablet, Take 1 Tablet (600 mg total) by mouth Four times a day as needed for Pain (Patient not taking: Reported on 01/10/2023)  lactobacillus comb no.10 (PROBIOTIC) 20 billion cell Oral Capsule, Take 1 Capsule by mouth Once a day  melatonin 3 mg Oral Capsule, Take 2 Capsules (6 mg total) by mouth Every night  pantoprazole (PROTONIX) 20 mg Oral Tablet, Delayed Release (E.C.), Take 1 Tablet (20 mg total) by mouth Twice daily  QUEtiapine (SEROQUEL) 50 mg Oral Tablet, Take 1 Tablet (50 mg total) by mouth Every night  simethicone (MYLICON) 80 mg Oral Tablet, Chewable, Chew 1 Tablet (80 mg total) Every 6 hours as needed  sumatriptan succinate (IMITREX) 100 mg Oral Tablet, Take 1 Tablet (100 mg total) by mouth Once, as needed for Migraine May repeat in 2 hours in needed  tadalafil (CIALIS) 20 mg Oral Tablet, Take 1 Tablet (20 mg total) by mouth Every 24 hours as needed  Testosterone 1 % (25 mg/2.5gram) Transdermal Gel in Packet, Place 2 Each on the skin Once a day 2 pumps each shoulder daily am    No  facility-administered medications prior to visit.      Past Social History  He is a Cytogeneticist.  He smokes cigars.  No illicit drug use.    Family History   Neg for GU malignancy      ROS:  Please see HPI.  All other systems were reviewed by me and are negative.                OBJECTIVE:  PHYSICAL EXAM:    BP (!) 129/91   Pulse 66   Temp 36.4 C (97.5 F)   Ht 1.778 m (5\' 10" )   Wt 94.1 kg (207 lb 7.3 oz)   SpO2 98%   BMI 29.77 kg/m       General:  NAD.  Vital signs reviewed.        LABS:  He reports that his PSAs are checked through the [facility].  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 Retrograde ejaculation with climacturia  3. Erectile dysfunction, mild  SHIM 17  4. Lower urinary tract symptoms (moderate) with urinary urgency  AUA symptom score 12    Patient seen as a shared visit with cosigned physician Dr. Felicita Gage present in clinic.    Lyndel Pleasure, PA-C    I personally saw and evaluated the patient. See mid-level's note for additional details. My findings/participation including MDM are:    PLAN:    -We would a long discussion regarding treatment options for climacturia.  At this point, in the absence of severe erectile dysfunction and/or severe stress urinary incontinence, surgical treatment options involving sling, mini jupette or artificial urethral sphincter would be far too aggressive. He isn't interested in any invasive intervention at this time.  -Patient is to continue emptying his bladder prior to any sexual activity  -He has been performing PFPT and was encouraged to continue to do so  - Refilled #30 Cialis 20 mg PRN, with refills   -RTC in one year    Earnie Larsson, MD 03/03/2023, 17:37  Genitourinary Reconstructive Surgery  Assistant Professor - Department of Urology

## 2023-03-01 NOTE — Addendum Note (Signed)
Addended by: Colletta Maryland on: 03/01/2023 05:36 PM     Modules accepted: Orders

## 2023-03-09 ENCOUNTER — Ambulatory Visit: Payer: 59 | Attending: Family | Admitting: Family

## 2023-03-09 ENCOUNTER — Encounter (HOSPITAL_BASED_OUTPATIENT_CLINIC_OR_DEPARTMENT_OTHER): Payer: Self-pay

## 2023-03-09 ENCOUNTER — Other Ambulatory Visit: Payer: Self-pay

## 2023-03-09 VITALS — BP 127/82 | HR 66 | Temp 98.4°F | Resp 16 | Ht 70.0 in | Wt 212.1 lb

## 2023-03-09 DIAGNOSIS — M5116 Intervertebral disc disorders with radiculopathy, lumbar region: Secondary | ICD-10-CM

## 2023-03-09 DIAGNOSIS — M47812 Spondylosis without myelopathy or radiculopathy, cervical region: Secondary | ICD-10-CM | POA: Insufficient documentation

## 2023-03-09 DIAGNOSIS — M4802 Spinal stenosis, cervical region: Secondary | ICD-10-CM | POA: Insufficient documentation

## 2023-03-09 DIAGNOSIS — M48061 Spinal stenosis, lumbar region without neurogenic claudication: Secondary | ICD-10-CM | POA: Insufficient documentation

## 2023-03-09 DIAGNOSIS — M5136 Other intervertebral disc degeneration, lumbar region: Secondary | ICD-10-CM | POA: Insufficient documentation

## 2023-03-09 DIAGNOSIS — Z981 Arthrodesis status: Secondary | ICD-10-CM | POA: Insufficient documentation

## 2023-03-09 DIAGNOSIS — M542 Cervicalgia: Secondary | ICD-10-CM | POA: Insufficient documentation

## 2023-03-09 DIAGNOSIS — M5416 Radiculopathy, lumbar region: Secondary | ICD-10-CM | POA: Insufficient documentation

## 2023-03-09 NOTE — Progress Notes (Signed)
Department of Neurosurgery  Outpatient Follow up    Name:  Sean Wall      MR#:    [id number]   DOB:  Dec 07, 1955     Date/Time: 03/09/2023, 13:21  ______________________________________________________________________    Assessment/Plan:     ICD-10-CM    1. Lumbar radiculopathy  M54.16 Referral to EXTERNAL PT Ortho Spine      2. History of lumbar fusion  Z98.1 Referral to EXTERNAL PT Ortho Spine      3. 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 Cervical spondylosis  M47.812 Referral to EXTERNAL PT Ortho Spine     [facility] INJE[facility]      5. Cervicalgia  M54.2 [place] [facility] INJECTIONS      6. Foraminal stenosis of cervical region  M48.02            Last Visit: 01/10/23 Patient returns to clinic with updated MRI imaging of the lumbar spine.  No acute processes noted.  There was no evidence of high-grade central or foraminal stenosis.  The patient does report ongoing pain in the lateral left lower extremity that rotates anteriorly to the knee and slightly below the knee.  He also has ongoing low back discomfort.  The patient otherwise denies any new focal deficits.  He denies sensory changes to bowel or bladder.  Further with hx of chronic cervicalgia with associated radicular symptoms RUE. MRI cervical spine 03/02/22 noting bilateral NFS C5-C6.  Patient undergoing CESI 06/15/22 and 10/12/22  noting improvement in radicular pain.     Plan:  We will refer the patient back to pain clinic for left L3-4 TFESI.  If the patient does not have improvement with the injection patient will need to return to the pain clinic for consideration of a spinal cord stimulator trial.  For now the patient will follow up with [place] on an as-needed basis.  He will call if he has any issues or concerns       Current Visit:  Patient returns after undergoing LESI Left L3-L4 and L4-L5 by PM on 01/25/23.  Patient reports  significant improvement in radicular pain involving left lower extremity.  Has some continual pain and discomfort involving the lateral aspect of the left thigh however anterior thigh pain significantly improve.  Patient wishes to maintain conservative treatment obtain avoid surgical intervention.  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  In addition aforementioned symptoms patient also complains of cervicalgia with referral into the left shoulder and proximal upper extremity.  Patient denies C6 radiculopathy.  Again MRI cervical spine last performed in 03/02/22 demonstrating neural foraminal stenosis C5-C6.         Plan:  1. Recommend course of physical therapy cervical and lumbar spine patient provided with prescription today.    2.  Send for CESI with interventional pain management to address patient's cervicalgia.    3. Will pend order for left L3-4 and L4-5 LESI by interventional pain management.    4. Patient may follow up with NSGY APP weeks post injection to assess treatment response.  5. Patient's questions were answered to satisfaction agreed with the plan of care.        Patient was evaluated independently.     Carmina Miller  Mayford Knife  [facility]   Department of Neurosurgery   03/09/2023 1:21 PM     This note was partially generated using MModal Fluency Direct system, and there may be some incorrect words, spellings, and punctuation that were not noted in checking the note before saving.     On the day of the encounter, a total of  25 minutes was spent by me on this patient encounter including review of historical information, examination, documentation.   _______________________________________________________________________    Chief Complaint:  Neck pain, Back pain, Leg pain, and right shoulder pain     History of Present Illness:  Sean Wall  is a 67 y.o. male who returns to the  Neurosurgery Clinc for a follow up appointment.    Conservative Treatment to date:    Time, Rest, Physical Therapy, Interventional Pain Management (injections)  He has used heat and ice p.r.n. for pain relief.  He has also taken gabapentin, hydrocodone, oral steroids and Flexeril for pain.       Procedures:   2018-L3-5 lumbar fusion in [place] by Dr. 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 Venetia Maxon. Right 2018 Dr Aileen Pilot CYST REMOVAL Right 1990    right wrist and right ankle. [employer]    HX LUMBAR FUSION  2018    hardware in place--Dr. Aileen Pilot NOSE/SINUS SURGERY  2005    deviated septum repair. Dr. Bjorn Loser SHOULDER SURGERY Bilateral     5 surgeries on right and 3 on the left    HX TURP      HX VASECTOMY      Dr. Sharyn Blitz HERNIA REPAIR Right 1991    [employer]    LASIK Bilateral 2004            Family History:  Family Medical History:       Problem Relation (Age of Onset)    No Known Problems Mother, Father, Sister, Brother, Half-Sister, Half-Brother,  Maternal Aunt, Maternal Uncle, Paternal Aunt, Paternal Uncle, Maternal Grandmother, Maternal Grandfather, Paternal Grandmother, Paternal Grandfather, Daughter, Son, Other              Allergies:  Allergies   Allergen Reactions  Propofol Rash    Lithium      Blurred vision       Home Medications:  Prior to Admission medications    Medication Sig Start Date End Date Taking? Authorizing Provider   atorvastatin (LIPITOR) 40 mg Oral Tablet Take 1 Tablet (40 mg total) by mouth Every evening   Yes Provider, Historical   azelastine-fluticasone 137-50 mcg/spray Nasal Spray, Non-Aerosol Administer 2 Sprays into affected nostril(s) Every night as needed Only uses if having a issue   Yes Provider, Historical   bisacodyL (DULCOLAX) 5 mg Oral Tablet, Delayed Release (E.C.) Take 2 Tablets (10 mg total) by mouth Every 24 hours as needed for Constipation for up to 1 dose To be taken in the evening 02/07/22  Yes Brayton Mars, MD   buPROPion (WELLBUTRIN XL) 300 mg extended release 24 hr tablet Take 1 Tablet (300 mg total) by mouth Once a day   Yes Provider, Historical   cetirizine (ZYRTEC) 10 mg Oral Tablet Take 1 Tablet (10 mg total) by mouth Every night as needed for Allergies   Yes Provider, Historical   cyanocobalamin (VITAMIN B12) 1,000 mcg/mL Injection Solution 1 mL (1,000 mcg total) Every 30 days Takes on the 21st   Yes Provider, Historical   cyclobenzaprine (FLEXERIL) 10 mg Oral Tablet Take 1 Tablet (10 mg total) by mouth Three times a day as needed Usually take at one efery  HS   Yes Provider, Historical   dextroamphetamine-amphetamine (ADDERALL) 10 mg Oral Tablet Take 1 Tablet (10 mg total) by mouth Once per day as needed for Other   Yes Provider, Historical   finasteride (PROSCAR) 5 mg Oral Tablet Take 1 Tablet (5 mg total) by mouth Once a day   Yes Provider, Historical   gabapentin (NEURONTIN) 300 mg Oral Capsule Take 1 Capsule (300 mg total) by mouth Twice daily Takes bid   Yes Provider, Historical   HERBAL DRUGS  ORAL Take 700 mg by mouth Once a day PEPPERMINT LEAF   Yes Provider, Historical   HYDROcodone-acetaminophen (NORCO) 5-325 mg Oral Tablet Take 1 Tablet by mouth Every 8 hours as needed for Pain    Provider, Historical   HYDROcodone-acetaminophen (NORCO) 5-325 mg Oral Tablet Take 1-2 Tablets by mouth Every 6 hours as needed for Pain  Patient not taking: Reported on 01/10/2023 03/28/22   Westley Hummer, MD   Ibuprofen (MOTRIN) 600 mg Oral Tablet Take 1 Tablet (600 mg total) by mouth Four times a day as needed for Pain  Patient not taking: Reported on 01/10/2023    Provider, Historical   lactobacillus comb no.10 (PROBIOTIC) 20 billion cell Oral Capsule Take 1 Capsule by mouth Once a day   Yes Provider, Historical   melatonin 3 mg Oral Capsule Take 2 Capsules (6 mg total) by mouth Every night   Yes Provider, Historical   metoprolol succinate (TOPROL-XL) 25 mg Oral Tablet Sustained Release 24 hr Take 1 Tablet (25 mg total) by mouth Once a day 01/05/23  Yes Provider, Historical   pantoprazole (PROTONIX) 20 mg Oral Tablet, Delayed Release (E.C.) Take 1 Tablet (20 mg total) by mouth Twice daily   Yes Provider, Historical   QUEtiapine (SEROQUEL) 50 mg Oral Tablet Take 1 Tablet (50 mg total) by mouth Every night   Yes Provider, Historical   simethicone (MYLICON) 80 mg Oral Tablet, Chewable Chew 1 Tablet (80 mg total) Every 6 hours as needed   Yes Provider, Historical   sumatriptan succinate (IMITREX) 100 mg Oral Tablet Take  1 Tablet (100 mg total) by mouth Once, as needed for Migraine May repeat in 2 hours in needed    Provider, Historical   tadalafil (CIALIS) 10 mg Oral Tablet Take 1 Tablet (10 mg total) by mouth Every 24 hours as needed 02/09/22 02/09/23  Dice, Franky Macho, PA-C   tadalafil (CIALIS) 20 mg Oral Tablet Take 1 Tablet (20 mg total) by mouth Every 24 hours as needed 03/10/22  Yes Andres Shad, MD   tadalafil (CIALIS) 20 mg Oral Tablet Take 1 Tablet (20 mg total) by mouth Every 24 hours as needed  Patient not taking:  Reported on 03/09/2023 03/01/23   Andres Shad, MD   Testosterone 1 % (25 mg/2.5gram) Transdermal Gel in Packet Place 2 Each on the skin Once a day 2 pumps each shoulder daily am   Yes Provider, Historical       Social History:   Social History     Tobacco Use    Smoking status: Light Smoker     Types: Cigars    Smokeless tobacco: Never    Tobacco comments:     OCCASIONAL SMOKING-1 CIGAR EVERY 2 WEEKS   Vaping Use    Vaping status: Never Used   Substance Use Topics    Alcohol use: Not Currently     Comment: none in the past month    Drug use: Never        Review of Systems:  Constitutional: no weight loss, fever, night sweats  GI: No change in bowel habits, No Nausea, vomitting, diarrhea, or constipation  GU: Negative for dysuria, frequency and incontinence  Neuro:  Pertinent positives as noted above otherwise negative.     Physical Exam:  BP 127/82   Pulse 66   Temp 36.9 C (98.4 F)   Resp 16   Ht 1.778 m (5\' 10" )   Wt 96.2 kg (212 lb 1.3 oz)   SpO2 98%   BMI 30.43 kg/m     General: patient resting in seated position appears in NAD.   Neck: trachea is midline, negative step off deformity.   Chest: normal chest excursion w/o use of accessory muscles.   Extremities: negative cyanosis.   MSK: gait appears non spastic. Negative step off deformity thoracic or lumbar spine. Negative TTP noted of thoracic spine. No focal muscle atrophy noted.   Neuro: AO x3, cooperative w/ exam. Moving all extremities antigravity w/o focal weakness.          Imaging:  I reviewed the study(ies) myself and made my own interpretation.  My interpretation can be found in the HPI and/or Assessment.  The following is the radiology report:    Narrative & Impression   Male, 67 years old.     MRI SPINE LUMBOSACRAL WO CONTRAST performed on 11/26/2022 10:20 AM.     REASON FOR EXAM:  M54.16: Lumbar radiculopathy  Z98.1: History of lumbar fusion  M47.816: Lumbar spondylosis  M54.50: Low back pain     TECHNIQUE: Magnetic resonance imaging of  the lumbar spine performed in the sagittal and transaxial planes. STIR sequence performed sagittally.     COMPARISON: MRI lumbar spine 05/04/2022     FINDINGS:  Postsurgical changes are noted at the L3-4 and L4-5 intervertebral disc space levels. There is been placement of 2 screws at the L3, L4 and L5 levels. The central canal patent at these levels.     There is thinning of the L2-3 intervertebral disc space with posterior disc protrusion and facet  hypertrophy. These findings result in a moderate degree of central canal stenosis and bilateral neural foraminal stenosis.     There is narrowing of the L1-2 intervertebral disc space with posterior disc protrusion and facet hypertrophy which results in very mild degree of central canal stenosis and bilateral neural foraminal stenosis.     The conus is at the T12 level.     At the L5-S1 level, there is moderate to severe facet hypertrophy. 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 There does appear to be mild to moderate neural foraminal stenosis on the left.     IMPRESSION:  1. Postsurgical changes are noted at L3-4 and L4-5 levels. The central canal patent.  2. 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 The findings are similar prior study and or slightly progressed.  3. At the L5-S1 level, there is moderate to severe facet hypertrophy and hypertrophy ligamentum flavum. There is mild to moderate neural foraminal stenosis on the left associated facet hypertrophy. There does appear to be lipomatosis in extrinsic compression upon the thecal sac at the L5-S1 level. Question clinical significance.        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        FINDINGS: There is normal alignment of cervical spine. No fracture or dislocation is seen, with vertebral body heights preserved. Bone marrow signal is within normal limits. Focal fat density of the C2 vertebral body is likely a small hemangioma.     At C2-C3 there is no spinal canal or neural foraminal stenosis.     At C3-C4 facet arthropathy results in mild bilateral neural foraminal narrowing. No spinal canal narrowing is seen.     At C4-C5 uncovertebral and facet arthropathy results in moderate left and mild right neural foraminal narrowing. A central left paracentral disc herniation results in mild spinal canal narrowing.     At C5-C6 there is central and paracentral zone disc herniation resulting in mild to moderate spinal canal narrowing. There is moderate bilateral neural foraminal narrowing due to uncovertebral and facet osteoarthropathy.     At C6-C7 there is moderate bilateral neural foraminal narrowing. No spinal canal narrowing is seen.     At C7-T1 there is no spinal canal narrowing. Mild bilateral neural foraminal narrowing is seen.     No abnormal signal is seen in the spinal cord. The soft tissues of the neck are unremarkable.     IMPRESSION:  Mild to moderate degenerative changes of the cervical spine, with mild to moderate spinal canal narrowing at C5-C6 as well as moderate neural foraminal narrowing on the left at C4-C5 and bilaterally at C5-C6 and C6-C7.                 Radiologist location ID: [id number]  This study was interpreted by: Hyman Bible, MD   This report has been reviewed and released by: Signed by: Hyman Bible, MD on 03/02/2022  7:53 AM

## 2023-03-14 ENCOUNTER — Ambulatory Visit (HOSPITAL_BASED_OUTPATIENT_CLINIC_OR_DEPARTMENT_OTHER): Payer: 59 | Admitting: Student in an Organized Health Care Education/Training Program

## 2023-03-15 ENCOUNTER — Other Ambulatory Visit (HOSPITAL_COMMUNITY): Payer: Self-pay | Admitting: FAMILY PRACTICE

## 2023-03-15 ENCOUNTER — Ambulatory Visit (HOSPITAL_BASED_OUTPATIENT_CLINIC_OR_DEPARTMENT_OTHER): Payer: 59 | Admitting: Student in an Organized Health Care Education/Training Program

## 2023-03-15 DIAGNOSIS — R413 Other amnesia: Secondary | ICD-10-CM

## 2023-04-06 ENCOUNTER — Telehealth (HOSPITAL_BASED_OUTPATIENT_CLINIC_OR_DEPARTMENT_OTHER): Payer: Self-pay | Admitting: Pain Medicine

## 2023-04-06 NOTE — Telephone Encounter (Signed)
Called patient to schedule CESI . Scheduled for 04-07-23 at 12 arrive at 11. Scheduled follow up for 05-05-23 at 10:15.  Went over instructions. Informed of med holds. Med holds : NO HOLDS    Sean Wall

## 2023-04-07 ENCOUNTER — Inpatient Hospital Stay (HOSPITAL_COMMUNITY)
Admission: RE | Admit: 2023-04-07 | Discharge: 2023-04-07 | Disposition: A | Discharge status: Home / Self Care | Payer: 59 | Source: Ambulatory Visit

## 2023-04-07 ENCOUNTER — Encounter (HOSPITAL_COMMUNITY): Payer: Self-pay | Admitting: Pain Medicine

## 2023-04-07 ENCOUNTER — Other Ambulatory Visit (HOSPITAL_BASED_OUTPATIENT_CLINIC_OR_DEPARTMENT_OTHER): Payer: Self-pay | Admitting: Pain Medicine

## 2023-04-07 ENCOUNTER — Other Ambulatory Visit: Payer: Self-pay

## 2023-04-07 ENCOUNTER — Encounter (HOSPITAL_COMMUNITY)
Admission: RE | Disposition: A | Discharge status: Home / Self Care | Payer: Self-pay | Source: Ambulatory Visit | Attending: Pain Medicine

## 2023-04-07 ENCOUNTER — Ambulatory Visit
Admission: RE | Admit: 2023-04-07 | Discharge: 2023-04-07 | Disposition: A | Discharge status: Home / Self Care | Payer: 59 | Source: Ambulatory Visit | Attending: Pain Medicine | Admitting: Pain Medicine

## 2023-04-07 ENCOUNTER — Encounter (HOSPITAL_COMMUNITY): Payer: 59 | Admitting: Pain Medicine

## 2023-04-07 DIAGNOSIS — M4722 Other spondylosis with radiculopathy, cervical region: Secondary | ICD-10-CM | POA: Insufficient documentation

## 2023-04-07 DIAGNOSIS — G8929 Other chronic pain: Secondary | ICD-10-CM | POA: Insufficient documentation

## 2023-04-07 DIAGNOSIS — M549 Dorsalgia, unspecified: Secondary | ICD-10-CM

## 2023-04-07 SURGERY — ~~LOC~~ OR MINOR EPIDURAL STEROID INJECTION
Anesthesia: Local (Nurse-Monitored) | Wound class: Clean Wound: Uninfected operative wounds in which no inflammation occurred

## 2023-04-07 MED ORDER — LIDOCAINE HCL 10 MG/ML (1 %) INJECTION SOLUTION
Freq: Once | INTRAMUSCULAR | Status: DC | PRN
Start: 2023-04-07 — End: 2023-04-07
  Administered 2023-04-07: 5 mL via INTRAMUSCULAR

## 2023-04-07 MED ORDER — TRIAMCINOLONE ACETONIDE 80 MG/ML SUSPENSION FOR INJECTION
Freq: Once | INTRAMUSCULAR | Status: DC | PRN
Start: 2023-04-07 — End: 2023-04-07
  Administered 2023-04-07: 80 mg via INTRAMUSCULAR

## 2023-04-07 MED ORDER — IOPAMIDOL 200 MG IODINE/ML (41 %) INTRATHECAL SOLUTION
3.0000 mL | INTRATHECAL | Status: AC
Start: 2023-04-07 — End: 2023-04-07
  Administered 2023-04-07: 3 mL via EPIDURAL

## 2023-04-07 MED ORDER — SODIUM CHLORIDE 0.9 % INJECTION SOLUTION
Freq: Once | INTRAMUSCULAR | Status: DC | PRN
Start: 2023-04-07 — End: 2023-04-07
  Administered 2023-04-07: 20 mL via INTRAMUSCULAR

## 2023-04-07 MED ORDER — IOPAMIDOL 200 MG IODINE/ML (41 %) INTRATHECAL SOLUTION
Freq: Once | INTRATHECAL | Status: DC | PRN
Start: 2023-04-07 — End: 2023-04-07
  Administered 2023-04-07 (×2): 3 mL via INTRAMUSCULAR

## 2023-04-07 SURGICAL SUPPLY — 10 items
APPL ISPRP CHG 10.5ML CHLRPRP HI-LT ORNG PREP STRL LF (MED SURG SUPPLIES) ×1 IMPLANT
BAG 36X36IN BAND EQP (DRAPE/PACKS/SHEETS/OR TOWEL) IMPLANT
CONV USE ITEM 321827 - GLOVE SURG 7.5 LF PF CLS STRL_BRN TINT 12IN PROTEXIS (GLOVES AND ACCESSORIES) ×1 IMPLANT
DISC NO SUB - TRAY EPIDRL 28X22IN 18GA 27GA LL TUOHY 1.25IN ASST VOL OVAL 10% PVP IOD PLASTIC 1 SHOT NEEDLE STICK (MED SURG SUPPLIES) ×1 IMPLANT
NEEDLE EPIDRL YW 4.5IN 20GA TUOHY METAL PLASTIC BVL STY REM WNG SLIDE DEPTH INDICATOR STRL LF  DISP (MED SURG SUPPLIES) IMPLANT
NEEDLE SPINAL 3.5IN 22GA SPNCN QUINCKE BPA PVC FREE DEHP-FR STRL LF (MED SURG SUPPLIES) IMPLANT
NEEDLE SPINAL BLK 5IN 22GA QUINCKE LONG LGTH REG WL POLYPROP STRL LF  DISP (MED SURG SUPPLIES) IMPLANT
NERVE BLOCK TRAY UNIV STRL LF  DISP (MED SURG SUPPLIES)
SYRINGE LL 20ML STRL GRAD MED DISP (MED SURG SUPPLIES) IMPLANT
TRAY UNIV NERVE BLOCK DISP SNG USE LF STRL (MED SURG SUPPLIES) IMPLANT

## 2023-04-07 NOTE — Nurses Notes (Signed)
[place] Pain Rating Scale     On a scale of 0-10, during the past 24 hours, pain has interfered with you usual activity: 5     On a scale of 0-10, during the past 24 hours, pain has interfered with your sleep: 5    On a scale of 0-10, during the past 24 hours, pain has affected your mood: 5     On a scale of 0-10, during the past 24 hours, pain has contributed to your stress: 5     On a scale of 0-10, what is your overall pain Rating: 5

## 2023-04-07 NOTE — OR Surgeon (Signed)
PATIENT NAME:  Sean Wall    MR#:  [id number]    DATE OF BIRTH:  August 27, 1955    DATE OF PROCEDURE:  04/07/23    PROCEDURE:  Cervical epidural steroid injection under fluoroscopic guidance at the C7-T1 level.    PERFORMING PHYSICIAN:  Lisbeth Renshaw, DO    PRE-PROCEDURE DIAGNOSIS:  Cervical radiculopathy, cervical spondylosis, chronic neck pain    POST-PROCEDURE DIAGNOSIS:  Diagnosis unchanged    ANESTHESIA:  Local    ESTIMATED BLOOD LOSS:  Minimal    COMPLICATIONS:  None    CONSENT:  Today's procedure, its potential benefits as well as its risks and potential side effects were reviewed.  Discussed risks of the procedure include bleeding, infection, nerve irritation or damage, reactions medications administered, headache, failure of the pain to improve, and exacerbation of the pain.  These risks were explained to the patient, who verbalized understanding and who wished to proceed.  Informed consent was signed.    DESCRIPTION OF PROCEDURE:  After written informed consent was obtained, the patient was taken to the fluoroscopy suite and placed in the prone position.  Anatomical landmarks were identified by way of fluoroscopy in multiple views.      The skin of the cervical region was prepped using antiseptic and draped in the usual sterile fashion.  Strict aseptic technique was utilized.  The skin and subcutaneous tissues at the needle entry site were infiltrated with 3 mL of 1% preservative-free lidocaine using 25-gauge 1-1/2-inch needle.      A Tuohy needle was then incrementally advanced under fluoroscopy using a loss-of-resistance technique.  Upon entering into the epidural space, a positive loss of resistance to air was noted and a characteristic pop was felt.  Proper placement into the epidural space was confirmed with a hanging column technique where preservative -free normal saline was noted to flow freely to gravity into the epidural space.  There were no parasthesias reported.  After negative aspiration for CSF or  heme, Isovue 200 M was used to delineate epidural spread.  After negative aspiration was reconfirmed, an injectate consisting of 1 mL of Kenalog 80 mg/mL mixed with 2.5 mL of preservative-free normal saline was slowly administered.      The patient tolerated the procedure well and all needles were removed intact.  Hemostasis was maintained.  There were no apparent paresthesias or complications.  The skin was wiped clean, and a Band-Aid was placed as appropriate.  The patient was monitored for an appropriate period of time following the procedure and remained hemodynamically stable and neurovascularly intact.  The patient was ultimately discharged to home with supervision in good condition and instructed to follow up in the office in approximately 14 days or sooner as warranted.     Lisbeth Renshaw, DO  04/07/2023, 13:25

## 2023-04-07 NOTE — Discharge Instructions (Signed)
[facility]  AT [place] - [place], [place] [address] - Phone: ([phone])      Complications (Reasons to call):  . Temperature greater than 101 degrees  . Unusual redness or swelling at the injection site  . Persistent nausea or vomiting or headache  . Persistent weakness, numbness or bleeding  . Loss of bowel or bladder control  . If you are unable to reach your physician and your symptoms are severe, have yourself brought to the nearest Emergency Department or call 911    You may experience:  . You may also experience a temporary increase in the level of your pain  . You may experience weakness, tingling or heavy feelings in your legs the first few hours after your procedure, requiring you to be cautious when ambulating (walking).   . Do NOT drive a car or operate heavy machinery for 24 hours after your procedure    Medications:  Most medications held for your procedure may be resumed one day after the procedure. Please ask your physician if you have questions about specific medications or the procedure itself.    Comfort measures:  . You may use ice over the injection site  . AVOID HEAT for the first 24 hours  . After that ice or heat may be used as needed. DO NOT apply LONGER than 20 minutes and wait 20 minutes before reapplication  . Avoid sitting in a bathtub, hot tub, pool, etc. for 3 days after your procedure    Activity:  . Rest at home for the next 24 hours  . Then increase activity as tolerated  . Typically it is ok to return to work one day after the procedure      It is recommended that you do not receive any vaccinations 7 days prior to a pain clinic procedure, or 7 days after a pain clinic procedure. Side effects from the vaccine may be similar to those that you might experience as an adverse reaction to a pain clinic procedure.

## 2023-04-07 NOTE — Nurses Notes (Signed)
Meds on field, see MD notes for amounts used.

## 2023-04-10 ENCOUNTER — Telehealth (HOSPITAL_BASED_OUTPATIENT_CLINIC_OR_DEPARTMENT_OTHER): Payer: Self-pay | Admitting: Student in an Organized Health Care Education/Training Program

## 2023-04-10 NOTE — Telephone Encounter (Signed)
Called to see how patient was doing after their CESI injection on 04/07/23 with Dr. Courtney Heys, and what percent of relief they had. No answer, left message to return our call  Marisue Ivan, MA

## 2023-05-05 ENCOUNTER — Other Ambulatory Visit: Payer: Self-pay

## 2023-05-05 ENCOUNTER — Ambulatory Visit: Payer: 59

## 2023-05-05 ENCOUNTER — Encounter (HOSPITAL_BASED_OUTPATIENT_CLINIC_OR_DEPARTMENT_OTHER): Payer: Self-pay

## 2023-05-05 VITALS — BP 138/82 | HR 68 | Temp 98.2°F | Resp 16 | Ht 70.0 in | Wt 210.3 lb

## 2023-05-05 DIAGNOSIS — M549 Dorsalgia, unspecified: Secondary | ICD-10-CM | POA: Insufficient documentation

## 2023-05-05 DIAGNOSIS — M79605 Pain in left leg: Secondary | ICD-10-CM

## 2023-05-05 DIAGNOSIS — M542 Cervicalgia: Secondary | ICD-10-CM | POA: Insufficient documentation

## 2023-05-05 DIAGNOSIS — M5489 Other dorsalgia: Secondary | ICD-10-CM

## 2023-05-05 NOTE — Progress Notes (Signed)
Department of Neurosurgery  Outpatient Follow up    Name:  Sean Wall      MR#:    [id number]   DOB:  13-Oct-1955     Date/Time: 05/05/2023, 10:16  ______________________________________________________________________    Assessment/Plan:     ICD-10-CM    1. Neck pain  M54.2 Refer to [facility]      2. Back pain, unspecified back location, unspecified back pain laterality, unspecified chronicity  M54.9 Refer to [facility]           Last Visit: Patient returns after undergoing LESI Left L3-L4 and L4-L5 by PM on 01/25/23.  Patient reports significant improvement in radicular pain involving left lower extremity.  Has some continual pain and discomfort involving the lateral aspect of the left thigh however anterior thigh pain significantly improve.  Patient wishes to maintain conservative treatment obtain avoid surgical intervention.  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  In addition aforementioned symptoms patient also complains of cervicalgia with referral into the left shoulder and proximal upper extremity.  Patient denies C6 radiculopathy.  Again MRI cervical spine last performed in 03/02/22 demonstrating neural foraminal stenosis C5-C6.     1. Recommend course of physical therapy cervical and lumbar spine patient provided with prescription today.    2.  Send for CESI with interventional pain management to address patient's cervicalgia.    3. Will pend order for left L3-4 and L4-5 LESI by interventional pain management.    4. Patient may follow up with [facility] weeks post injection to assess treatment response.  5. Patient's questions were answered to satisfaction agreed with the plan of care.     Current Visit: Patient returns to clinic to reassess symptoms after undergoing C7-T1 CESI with Dr. Darrick Huntsman on 04/07/23. States he has about 30% ongoing relief from this injection. Denies any major  pain complaints at this time. LLE pain is slowly starting to return and states he will be due for a repeat lumbar injection soon. He does not want to proceed with any surgical intervention. Wishes to proceed with injection therapy with [place] pain clinic for back and neck.     -- Referral placed to [facility] pain clinic.     -- Patient will RTC on PRN basis for any new or worsening symptoms.     Patient evaluated independently in clinic.      Daneil Dan, PA-C  [facility] Department of Neurosurgery   05/05/2023 10:16 AM   _______________________________________________________________________    Chief Complaint:  Neck pain, Back pain, and Leg pain    History of Present Illness:  Sean Wall  is a 67 y.o. male who returns to the Neurosurgery Clinc for a follow up appointment.    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 Venetia Maxon. Right 2018 Dr Aileen Pilot CYST REMOVAL Right 1990    right wrist and right ankle. [employer]    HX LUMBAR FUSION  2018    hardware in place--Dr. Aileen Pilot NOSE/SINUS SURGERY  2005    deviated septum  repair. Dr. Bjorn Loser SHOULDER SURGERY Bilateral     5 surgeries on right and 3 on the left    HX TURP      HX VASECTOMY      Dr. Sharyn Blitz HERNIA REPAIR Right 1991    [employer]    LASIK Bilateral 2004            Family History:  Family Medical History:       Problem Relation (Age of Onset)    No Known Problems Mother, Father, Sister, Brother, Half-Sister, Half-Brother, Maternal Aunt, Maternal Uncle, Paternal Aunt, Paternal Uncle, Maternal Grandmother, Maternal Grandfather, Paternal Grandmother, Paternal Grandfather, Daughter, Son, Other              Allergies:  Allergies   Allergen Reactions    Propofol Rash    Lithium      Blurred vision       Home Medications:  Prior to Admission medications    Medication Sig Start Date End Date Taking? Authorizing Provider   atorvastatin (LIPITOR) 40 mg Oral Tablet Take 1 Tablet (40 mg total) by mouth Every evening   Yes Provider, Historical   azelastine-fluticasone 137-50 mcg/spray Nasal Spray, Non-Aerosol Administer 2 Sprays into affected nostril(s) Every night as needed Only uses if having a issue   Yes Provider, Historical   bisacodyL (DULCOLAX) 5 mg Oral Tablet, Delayed Release (E.C.) Take 2 Tablets (10 mg total) by mouth Every 24 hours as needed for Constipation for up to 1 dose To be taken in the evening  Patient not taking: Reported on 04/07/2023 02/07/22   Brayton Mars, MD   buPROPion (WELLBUTRIN XL) 300 mg extended release 24 hr tablet Take 1 Tablet (300 mg total) by mouth Once a day   Yes Provider, Historical   cetirizine (ZYRTEC) 10 mg Oral Tablet Take 1 Tablet (10 mg total) by mouth Every night as needed for Allergies   Yes Provider, Historical   cyanocobalamin (VITAMIN B12) 1,000 mcg/mL Injection Solution 1 mL (1,000 mcg total) Every 30 days Takes on the 21st   Yes Provider, Historical   cyclobenzaprine (FLEXERIL) 10 mg Oral Tablet Take 1 Tablet (10 mg total) by mouth Three times a day as needed Usually take at one efery  HS   Yes  Provider, Historical   dextroamphetamine-amphetamine (ADDERALL) 10 mg Oral Tablet Take 1 Tablet (10 mg total) by mouth Once per day as needed for Other   Yes Provider, Historical   finasteride (PROSCAR) 5 mg Oral Tablet Take 1 Tablet (5 mg total) by mouth Once a day  Yes Provider, Historical   gabapentin (NEURONTIN) 300 mg Oral Capsule Take 1 Capsule (300 mg total) by mouth Twice daily Takes bid   Yes Provider, Historical   HERBAL DRUGS ORAL Take 700 mg by mouth Once a day PEPPERMINT LEAF  Patient not taking: Reported on 04/07/2023    Provider, Historical   HYDROcodone-acetaminophen (NORCO) 5-325 mg Oral Tablet Take 1 Tablet by mouth Every 8 hours as needed for Pain   Yes Provider, Historical   HYDROcodone-acetaminophen (NORCO) 5-325 mg Oral Tablet Take 1-2 Tablets by mouth Every 6 hours as needed for Pain  Patient not taking: Reported on 01/10/2023 03/28/22   Westley Hummer, MD   Ibuprofen (MOTRIN) 600 mg Oral Tablet Take 1 Tablet (600 mg total) by mouth Four times a day as needed for Pain  Patient not taking: Reported on 01/10/2023    Provider, Historical   lactobacillus comb no.10 (PROBIOTIC) 20 billion cell Oral Capsule Take 1 Capsule by mouth Once a day   Yes Provider, Historical   melatonin 3 mg Oral Capsule Take 2 Capsules (6 mg total) by mouth Every night   Yes Provider, Historical   metoprolol succinate (TOPROL-XL) 25 mg Oral Tablet Sustained Release 24 hr Take 0.5 Tablets (12.5 mg total) by mouth Once a day 01/05/23  Yes Provider, Historical   pantoprazole (PROTONIX) 20 mg Oral Tablet, Delayed Release (E.C.) Take 1 Tablet (20 mg total) by mouth Twice daily   Yes Provider, Historical   QUEtiapine (SEROQUEL) 50 mg Oral Tablet Take 1 Tablet (50 mg total) by mouth Every night   Yes Provider, Historical   simethicone (MYLICON) 80 mg Oral Tablet, Chewable Chew 1 Tablet (80 mg total) Every 6 hours as needed  Patient not taking: Reported on 04/07/2023    Provider, Historical   sumatriptan succinate (IMITREX) 100  mg Oral Tablet Take 1 Tablet (100 mg total) by mouth Once, as needed for Migraine May repeat in 2 hours in needed   Yes Provider, Historical   tadalafil (CIALIS) 20 mg Oral Tablet Take 1 Tablet (20 mg total) by mouth Every 24 hours as needed 03/10/22  Yes Andres Shad, MD   tadalafil (CIALIS) 20 mg Oral Tablet Take 1 Tablet (20 mg total) by mouth Every 24 hours as needed  Patient not taking: Reported on 03/09/2023 03/01/23   Andres Shad, MD   Testosterone 1 % (25 mg/2.5gram) Transdermal Gel in Packet Place 2 Each on the skin Once a day 2 pumps each shoulder daily am   Yes Provider, Historical       Social History:   Social History     Tobacco Use    Smoking status: Light Smoker     Types: Cigars    Smokeless tobacco: Never    Tobacco comments:     OCCASIONAL SMOKING-1 CIGAR EVERY 2 WEEKS   Vaping Use    Vaping status: Never Used   Substance Use Topics    Alcohol use: Not Currently     Comment: none in the past month    Drug use: Never        Review of Systems:  Constitutional: no weight loss, fever, night sweats  GI: No change in bowel habits, No Nausea, vomitting, diarrhea, or constipation  GU: Negative for dysuria, frequency and incontinence  Neuro: negative    Physical Exam:  BP 138/82   Pulse 68   Temp 36.8 C (98.2 F)   Resp 16   Ht 1.778 m (5\' 10" )  Wt 95.4 kg (210 lb 5.1 oz)   SpO2 97%   BMI 30.18 kg/m     General: NAD, AAO x 3  Motor: Moving 4/4 extremities anti-gravity    Imaging:  I reviewed the study(ies) myself and made my own interpretation.  My interpretation can be found in the HPI and/or Assessment.  The following is the radiology report:    No updated imaging.

## 2023-05-25 ENCOUNTER — Encounter (HOSPITAL_BASED_OUTPATIENT_CLINIC_OR_DEPARTMENT_OTHER): Payer: Self-pay | Admitting: Pain Medicine

## 2023-05-25 ENCOUNTER — Ambulatory Visit: Payer: 59 | Attending: Pain Medicine | Admitting: Pain Medicine

## 2023-05-25 ENCOUNTER — Other Ambulatory Visit: Payer: Self-pay

## 2023-05-25 VITALS — BP 118/72 | HR 69 | Temp 97.6°F | Resp 16 | Ht 70.0 in | Wt 205.0 lb

## 2023-05-25 DIAGNOSIS — M4726 Other spondylosis with radiculopathy, lumbar region: Secondary | ICD-10-CM

## 2023-05-25 DIAGNOSIS — M5020 Other cervical disc displacement, unspecified cervical region: Secondary | ICD-10-CM | POA: Insufficient documentation

## 2023-05-25 DIAGNOSIS — M542 Cervicalgia: Secondary | ICD-10-CM | POA: Insufficient documentation

## 2023-05-25 DIAGNOSIS — M545 Low back pain, unspecified: Secondary | ICD-10-CM | POA: Insufficient documentation

## 2023-05-25 DIAGNOSIS — M549 Dorsalgia, unspecified: Secondary | ICD-10-CM | POA: Insufficient documentation

## 2023-05-25 DIAGNOSIS — M5416 Radiculopathy, lumbar region: Secondary | ICD-10-CM | POA: Insufficient documentation

## 2023-05-25 DIAGNOSIS — M5412 Radiculopathy, cervical region: Secondary | ICD-10-CM | POA: Insufficient documentation

## 2023-05-25 DIAGNOSIS — M47816 Spondylosis without myelopathy or radiculopathy, lumbar region: Secondary | ICD-10-CM | POA: Insufficient documentation

## 2023-05-25 DIAGNOSIS — M47812 Spondylosis without myelopathy or radiculopathy, cervical region: Secondary | ICD-10-CM | POA: Insufficient documentation

## 2023-05-25 DIAGNOSIS — Z981 Arthrodesis status: Secondary | ICD-10-CM | POA: Insufficient documentation

## 2023-05-25 NOTE — Progress Notes (Signed)
[facility]  Pain Management, [facility]  [address]  [place] [address]  [phone]    PATIENT NAME:  Manik Westgate Olinde                                             MEDICAL RECORD NUMBER:  [id number]  DATE OF BIRTH:  11-14-1955      PRIMARY CARE PHYSICIAN:  Marin Shutter, MD  REFERRING PHYSICIAN:  Daneil Dan, PA-C  DATE OF VISIT:  May 25, 2023                                                                                          FOLLOW UP OFFICE VISIT NOTE:    REASON FOR VISIT  Follow up for: Low Back Pain    HPI:  Patient is a 67 y.o.  male with h/o L3-L5 fusion who presents for follow up visit today for evaluation of Low back pain with radiation to the left leg. Most recently patient underwent C7/T1 CESI on 04/07/23 with good ongoing pain relief.    Today, the pain is rated at a 7/10, and is located in the Low Back mostly localized with some radiation of pain to the Left hip and upper lateral thigh.  - It is described as Aching, Throbbing, Stabbing, Stinging, and Tingling.  - It is exacerbated with bending, lifting, and walking and alleviated with rest and lying.   -           Frequency of the pain is Daily with Fluctuations  - There is no associated lower extremity weakness, numbness, and tingling  - The patient denies ongoing fever/chills, night sweats, unintended weight loss and bowel or bladder incontinence.  -           Medications tried include Gabapentin, Hydrocodone, oral steroids, and Flexeril     Of note, patient comes to clinic today alone. He was history of L3-L5 lumbar fusion in 2018 by Dr. Venetia Maxon in [place]. He is an Designer, television/film set.    Treatments tried:  As indicated in the past documentation, patient has tried conservative measures including medications, physical therapy/home exercises, and heat/ice     Injections:   2018-L3-5 lumbar fusion in [place] by Dr. Venetia Maxon  06/15/22-right CESI C7-T1-50% pain relief for 2  months  09/12/22-LESI L2-L3-no pain relief  10/12/22-CESI C7-T1-65% ongoing pain relief  01/25/23- Left L3/L4, L4/L5 TFESI- 95% pain relief for 3 months  04/07/23- C7/T1 CESI- 80% ongoing pain relief    Blood thinners: none  Prescribed by: N/A    Have you had therapy for the condition you are being seen for today?: No  Are your symptoms worsening?: Yes  Are your symptoms worsening?: Yes  Do you have weakness in your arms/legs? : Yes  Do you have numbness/tingling in your arms/legs? 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    HTN (hypertension)     Hyperlipidemia     Hypotension, unspecified hypotension type 12/30/2022    Ischemic colitis (CMS HCC)     Sleep apnea     Stroke-like symptom 12/29/2022    Syncope 12/29/2022    Tinnitus     Wears glasses            Patient Active Problem List   Diagnosis    Primary hypertension    Chronic GERD    BPH (benign prostatic hyperplasia)    Family history of migraine headaches    Mood disorder (CMS HCC)    Tobacco use disorder    Ischemic colitis (CMS HCC)    Irritable bowel syndrome with diarrhea    History of colon polyps    Primary osteoarthritis of both hips    Syncope, unspecified syncope type    History of migraine    Chronic pain syndrome       Family Medical History:       Problem Relation (Age of Onset)    No Known Problems Mother, Father, Sister, Brother, Half-Sister, Half-Brother, Maternal Aunt, Maternal Uncle, Paternal Aunt, Paternal Uncle, Maternal Grandmother, Maternal Grandfather, Paternal Grandmother, Paternal Grandfather, Daughter, Son,  Other              Medications:  atorvastatin (LIPITOR) 40 mg Oral Tablet, Take 1 Tablet (40 mg total) by mouth Every evening  azelastine-fluticasone 137-50 mcg/spray Nasal Spray, Non-Aerosol, Administer 2 Sprays into affected nostril(s) Every night as needed Only uses if having a issue  bisacodyL (DULCOLAX) 5 mg Oral Tablet, Delayed Release (E.C.), Take 2 Tablets (10 mg total) by mouth Every 24 hours as needed for Constipation for up to 1 dose To be taken in the evening  buPROPion (WELLBUTRIN XL) 300 mg extended release 24 hr tablet, Take 1 Tablet (300 mg total) by mouth Once a day  cetirizine (ZYRTEC) 10 mg Oral Tablet, Take 1 Tablet (10 mg total) by mouth Every night as needed for Allergies  cyanocobalamin (VITAMIN B12) 1,000 mcg/mL Injection Solution, 1 mL (1,000 mcg total) Every 30 days Takes on the 21st  cyclobenzaprine (FLEXERIL) 10 mg Oral Tablet, Take 1 Tablet (10 mg total) by mouth Three times a day as needed Usually take at one efery  HS  dextroamphetamine-amphetamine (ADDERALL) 10 mg Oral Tablet, Take 1 Tablet (10 mg total) by mouth Once per day as needed for Other  finasteride (PROSCAR) 5 mg Oral Tablet, Take 1 Tablet (5 mg total) by mouth Once a day  gabapentin (NEURONTIN) 300 mg Oral Capsule, Take 1 Capsule (300 mg total) by mouth Twice daily Takes bid  HERBAL DRUGS ORAL, Take 700 mg by mouth Once a day PEPPERMINT LEAF  HYDROcodone-acetaminophen (NORCO) 5-325 mg Oral Tablet, Take 1 Tablet by mouth Every 8 hours as needed for Pain  HYDROcodone-acetaminophen (NORCO) 5-325 mg Oral Tablet, Take 1-2 Tablets by mouth Every 6 hours as needed for Pain  Ibuprofen (MOTRIN) 600 mg Oral Tablet, Take 1 Tablet (600 mg total) by mouth Four times a day as needed for Pain  lactobacillus comb no.10 (  PROBIOTIC) 20 billion cell Oral Capsule, Take 1 Capsule by mouth Once a day  melatonin 3 mg Oral Capsule, Take 2 Capsules (6 mg total) by mouth Every night  metoprolol succinate (TOPROL-XL) 25 mg Oral Tablet Sustained  Release 24 hr, Take 0.5 Tablets (12.5 mg total) by mouth Once a day  pantoprazole (PROTONIX) 20 mg Oral Tablet, Delayed Release (E.C.), Take 1 Tablet (20 mg total) by mouth Twice daily  QUEtiapine (SEROQUEL) 50 mg Oral Tablet, Take 1 Tablet (50 mg total) by mouth Every night  simethicone (MYLICON) 80 mg Oral Tablet, Chewable, Chew 1 Tablet (80 mg total) Every 6 hours as needed  sumatriptan succinate (IMITREX) 100 mg Oral Tablet, Take 1 Tablet (100 mg total) by mouth Once, as needed for Migraine May repeat in 2 hours in needed  tadalafil (CIALIS) 20 mg Oral Tablet, Take 1 Tablet (20 mg total) by mouth Every 24 hours as needed  tadalafil (CIALIS) 20 mg Oral Tablet, Take 1 Tablet (20 mg total) by mouth Every 24 hours as needed  Testosterone 1 % (25 mg/2.5gram) Transdermal Gel in Packet, Place 2 Each on the skin Once a day 2 pumps each shoulder daily am    No facility-administered medications prior to visit.      Medication Allergies:  Propofol and Lithium      Social History     Socioeconomic History    Marital status: Married     Spouse name: Not on file    Number of children: Not on file    Years of education: Not on file    Highest education level: Not on file   Occupational History    Not on file   Tobacco Use    Smoking status: Light Smoker     Types: Cigars    Smokeless tobacco: Never    Tobacco comments:     OCCASIONAL SMOKING-1 CIGAR EVERY 2 WEEKS   Vaping Use    Vaping status: Never Used   Substance and Sexual Activity    Alcohol use: Not Currently     Comment: none in the past month    Drug use: Never    Sexual activity: Not on file   Other Topics Concern    Ability to Walk 1 Flight of Steps without SOB/CP Yes    Routine Exercise Not Asked    Ability to Walk 2 Flight of Steps without SOB/CP Not Asked    Unable to Ambulate Not Asked    Total Care Not Asked    Ability To Do Own ADL's Yes    Uses Walker Not Asked    Other Activity Level Not Asked    Uses Cane Not Asked   Social History Narrative    Not on file      Social Determinants of Health     Financial Resource Strain: Not on file   Transportation Needs: Not on file   Social Connections: Low Risk  (12/29/2022)    Social Connections     SDOH Social Isolation: 5 or more times a week   Intimate Partner Violence: Unknown (11/17/2021)    Received from [employer], [facility]    HITS     Physically Hurt: Not on file     Insult or Talk Down To: Not on file     Threaten Physical Harm: Not on file     Scream or Curse: Not on file   Housing Stability: Not on file         REVIEW OF  SYSTEMS:   Review of Systems -   Positive for eyeglasses, diverticulosis, arthritis, problems with sexual intercourse, scoliosis, depression, neck pain, and low back pain.     EXAM:    Vitals:    05/25/23 0845   BP: 118/72   Pulse: 69   Resp: 16   Temp: 36.4 C (97.6 F)   SpO2: 98%   Weight: 93 kg (205 lb)   Height: 1.778 m (5\' 10" )   BMI: 29.48            Physical Exam     GENERAL:  The patient is a 67 y.o. male resting comfortably in a seated position in no apparent distress.  The patient demonstrates no pain behavior, symptom magnification or drug seeking behavior.  PSYCHIATRIC:  Speech is fluent.  Affect is appropriate.  The patient demonstrates no signs of hypersomnolence, sedation or confusion.  HEENT:  Normocephalic.  Atraumatic.  Pupils are appropriate size for room lighting.  Extraocular muscles intact.  Neck supple.  CARDIOVASCULAR:  Regular rate and rhythm.  Bilateral radial pulses intact.  RESPIRATORY:  Unlabored respirations.  No accessory muscle use noted.   ABDOMEN:  Soft.  Non-tender.  Non-distended.  UPPER EXTREMITY NEUROLOGIC:  Muscle strength is 5/5 on the right and 5/5 on the left in all major proximal and distal muscle groups of the upper extremities.  Biceps, triceps and brachioradialis reflexes are grossly intact bilaterally.  Sensation is grossly intact in all dermatomes of the upper extremities.  LOWER EXTREMITY NEUROLOGIC:  Muscle strength is 5/5 on the right and 5/5 on  the left in all major proximal and distal muscle groups of the lower extremities.  Achilles and patellar reflexes are grossly intact bilaterally.  Sensation is grossly intact in all dermatomes of the lower extremities.  MUSCULOSKELETAL:  The patient is able to rise from a seated to a standing position without significant difficulty and delay.  Gait is non-antalgic.     Provocative Maneuvers   Lumbar Tenderness: positive  SLR: positive on  left  Facet Loading: Lumbar negative   SI joint-  SI joint compression: Pain: no.    SI joint distraction:no.      GT Bursa palpation: no.           Imaging:  Recent Results (from the past [id number] hour(s))   MRI SPINE LUMBOSACRAL WO CONTRAST    Collection Time: 11/26/22 10:20 AM    Narrative    Male, 67 years old.    MRI SPINE LUMBOSACRAL WO CONTRAST performed on 11/26/2022 10:20 AM.    REASON FOR EXAM:  M54.16: Lumbar radiculopathy  Z98.1: History of lumbar fusion  M47.816: Lumbar spondylosis  M54.50: Low back pain    TECHNIQUE: Magnetic resonance imaging of the lumbar spine performed in the sagittal and transaxial planes. STIR sequence performed sagittally.    COMPARISON: MRI lumbar spine 05/04/2022    FINDINGS:  Postsurgical changes are noted at the L3-4 and L4-5 intervertebral disc space levels. There is been placement of 2 screws at the L3, L4 and L5 levels. The central canal patent at these levels.    There is thinning of the L2-3 intervertebral disc space with posterior disc protrusion and facet hypertrophy. These findings result in a moderate degree of central canal stenosis and bilateral neural foraminal stenosis.    There is narrowing of the L1-2 intervertebral disc space with posterior disc protrusion and facet hypertrophy which results in very mild degree of central canal stenosis and bilateral neural foraminal stenosis.  The conus is at the T12 level.    At the L5-S1 level, there is moderate to severe facet hypertrophy. There is epidural lipomatosis with extrinsic  compression upon the thecal sac at L5-S1 level. There does appear to be mild to moderate neural foraminal stenosis on the left.      Impression    1. Postsurgical changes are noted at L3-4 and L4-5 levels. The central canal patent.  2. 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 The findings are similar prior study and or slightly progressed.  3. At the L5-S1 level, there is moderate to severe facet hypertrophy and hypertrophy ligamentum flavum. There is mild to moderate neural foraminal stenosis on the left associated facet hypertrophy. There does appear to be lipomatosis in extrinsic compression upon the thecal sac at the L5-S1 level. Question clinical significance.      Radiologist location ID: [id number]     MRI SPINE LUMBOSACRAL W/WO CONTRAST    Collection Time: 05/04/22  1:07 PM    Narrative    History: Low back pain.    TECHNIQUE: Multiplanar multi sequence MR imaging lumbar spine is performed with and without the use of intravenous contrast.    There are postoperative changes of L3 and L4 laminectomies with posterior stabilization from L3 through L5 causing transpedicular screws, spinal rods, and intervertebral disc prosthesis. Spinal hardware does result in magnetic sensibility artifact limiting the exam. Alignment is near-anatomic with slight straightening of the lumbar lordosis. Type I and type II degenerative endplate changes are seen with mild endplate invagination at L3-4 and L4-5. There is no fracture.    At the L1-2 level, there is narrowing and desiccation of intervertebral disc. There is generalized disc bulging and early facet joint arthrosis. This results in moderate right and mild left neural foraminal encroachment. There is effacement of the thecal sac with no significant spinal stenosis.    At the L2-3 level, there is narrowing and desiccation of intervertebral disc. There is a large generalized disc bulge with  prominent posterior osteophyte formation. This combines with facet joint arthrosis and prominent ligamentum flavum to cause moderate right greater than left neural foraminal encroachment and moderate spinal canal stenosis. There is suspected impingement on the left L2 nerve root laterally.    At the L3-4 and L4-5 levels, the neural foramen appear patent bilaterally. There is good decompression of the thecal sac with no significant spinal stenosis.    At the L5-S1 level, there is mild disc bulging and posterior osteophyte formation. There is prominent facet joint arthrosis. This results in a moderate left and mild right neural foraminal encroachment. Of note, the thecal sac is extremely small at this level      Impression    1. Postoperative changes of posterior stabilization from L3 through L5 as described above. Alignment is near-anatomic with slight straightening of the lumbar lordosis.  2. 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  3. The findings are most pronounced at L2-3 where there is moderate spinal canal stenosis and bilateral neural foraminal encroachment secondary to disc bulging, osteophyte formation, facet joint arthrosis, and prominent ligamentum flavum. There is suspected impingement on the left L2 nerve root laterally at this level.      Radiologist location ID: [id number]           IMPRESSION:    Coden was seen today for low back pain, neck pain and left leg pain.  Diagnoses and all orders for this visit:    Lumbar radiculopathy  -     TRANSFORAMINAL EPIDURAL STEROID INJECTIN - LUMBAR; Future    Neck pain  -     Refer to [facility]    Back pain, unspecified back location, unspecified back pain laterality, unspecified chronicity  -     Refer to [facility]    Lumbar spondylosis  -     TRANSFORAMINAL EPIDURAL STEROID INJECTIN - LUMBAR; Future    History of lumbar fusion  -     TRANSFORAMINAL EPIDURAL STEROID INJECTIN - LUMBAR; Future    Low back  pain  -     TRANSFORAMINAL EPIDURAL STEROID INJECTIN - LUMBAR; Future    Cervical disc herniation    Cervical radiculopathy    Cervical spondylosis          MEDICAL DECISION MAKING AND PLAN    Patient is a 67 y.o.  male who was seen  today for follow up for reevaluation of low back pain with radiation to the left leg.    Given the patient's symptoms, examination findings, and imaging results noted above, I discussed the utility of proceeding with a  Left L4/L5, L5/S1 transforaminal epidural steroid injection under fluoroscopic guidance.  Using an anatomical model, the procedure, as well as its potential risks, benefits, and reasonable alternatives were discussed in detail.  Discussed risks of the procedure included but are not limited to bleeding, infection, allergic reaction, nerve damage, hematoma fomation, abscess formation, failure of the pain to improve and potential worsening of the pain.      Since Mr. Para has failed at least 6 weeks of conservative measures including over-the-counter medications, prescription medications, physical therapy, and a home exercise program it is reasonable to proceed with the above injection.  The patient verbalized understanding to the potential risks, benefits, and reasonable alternatives to the above injection and wishes to proceed.  His response will help to further determine a treatment plan.    Mr. Pinchback was recommended to continue conservative treatment including home exercises and stretching and current medications.  Patient understood and verbalized understanding.    Mr. Erler will follow-up in 2-4 weeks after the injections or sooner if warranted.    I am scribing for, and in the presence of, Dr. Lisbeth Renshaw for services provided on 05/25/2023.  Lupita Raider, [place]    I personally performed the services described in this documentation, as scribed in my presence, and is both accurate and complete.     Lisbeth Renshaw, DO  Interventional Pain Medicine

## 2023-05-25 NOTE — Nursing Note (Signed)
Pain and Function:     [place] Pain Rating Scale     On a scale of 0-10, during the past 24 hours, pain has interfered with you usual activity:   10    On a scale of 0-10, during the past 24 hours, pain has interfered with your sleep:  4    On a scale of 0-10, during the past 24 hours, pain has affected your mood:   0    On a scale of 0-10, during the past 24 hours, pain has contributed to your stress:   0    On a scale of 0-10, what is your overall pain Rating:  7

## 2023-05-28 ENCOUNTER — Encounter (HOSPITAL_BASED_OUTPATIENT_CLINIC_OR_DEPARTMENT_OTHER): Payer: Self-pay | Admitting: Pain Medicine

## 2023-06-02 ENCOUNTER — Encounter (INDEPENDENT_AMBULATORY_CARE_PROVIDER_SITE_OTHER): Payer: Self-pay | Admitting: Rehabilitative and Restorative Service Providers"

## 2023-06-02 NOTE — Progress Notes (Signed)
Physical Therapy, [facility]  [address]  [place] [address]  [phone]  Physical Therapy Discharge Assessment        Patient Name: Sean Wall  Date of Birth: 02/27/1956      Reason for PT: Referred to PT for R hip pain versus lumbar radiculopathy    Previous Therapy: Therapeutic exercise for core stabilization and neural flossing to decrease pain in R hip and lumbar spine. Graston Techniques and manual techniques to lumbar spine to promote soft tissue mobility.     Total Number of Therapy Visits To Date: 4      Assessment:     Patient Goals: NOT MET      Plan:     Discharge Summary:  Patient treated for physical therapy visits this POC.  Patient continued to have pain in right hip and lumbar spine.  Patient's symptoms did not improve right hip intra-articular injection.  Symptoms did not significantly change with physical therapy treatment either.  Patient's last physical therapy visit was on 12/13/2022.  Patient's status after that time is unknown.  Patient did not meet goals and continued to have symptoms at last physical therapy visit.        Therapist:   Glade Nurse, PT, DPT  06/02/2023, 09:28

## 2023-06-16 ENCOUNTER — Ambulatory Visit (HOSPITAL_BASED_OUTPATIENT_CLINIC_OR_DEPARTMENT_OTHER): Payer: Self-pay | Admitting: Pain Medicine

## 2023-06-16 NOTE — Telephone Encounter (Signed)
LEFT MESSAGE TO CALL AND SCHEDULE INJECTION. Victoria G Sipes

## 2023-06-21 ENCOUNTER — Ambulatory Visit (HOSPITAL_BASED_OUTPATIENT_CLINIC_OR_DEPARTMENT_OTHER): Payer: Self-pay | Admitting: Pain Medicine

## 2023-06-21 NOTE — Telephone Encounter (Signed)
SPOKE TO PATIENT TODAY and we scheduled TFESI for 07/14/23 @ 915 AM. Patient is to be there @ 815 AM.  I confirmed patients insurance and verbally went over all instructions with the patient as well as printed them and gave them/mailed them to the patient. We also went over the medications that need held which are MOTRIN for 4 days.  All questions were answered. The patient voiced understanding.   Iver Nestle

## 2023-07-14 ENCOUNTER — Encounter (HOSPITAL_COMMUNITY): Payer: Self-pay | Admitting: Pain Medicine

## 2023-07-14 ENCOUNTER — Other Ambulatory Visit: Payer: Self-pay

## 2023-07-14 ENCOUNTER — Encounter (HOSPITAL_COMMUNITY): Payer: 59 | Admitting: Pain Medicine

## 2023-07-14 ENCOUNTER — Other Ambulatory Visit (HOSPITAL_BASED_OUTPATIENT_CLINIC_OR_DEPARTMENT_OTHER): Payer: Self-pay | Admitting: Pain Medicine

## 2023-07-14 ENCOUNTER — Inpatient Hospital Stay (HOSPITAL_COMMUNITY)
Admission: RE | Admit: 2023-07-14 | Discharge: 2023-07-14 | Disposition: A | Discharge status: Home / Self Care | Payer: 59 | Source: Ambulatory Visit

## 2023-07-14 ENCOUNTER — Encounter (HOSPITAL_COMMUNITY)
Admission: RE | Disposition: A | Discharge status: Home / Self Care | Payer: Self-pay | Source: Ambulatory Visit | Attending: Pain Medicine

## 2023-07-14 ENCOUNTER — Inpatient Hospital Stay
Admission: RE | Admit: 2023-07-14 | Discharge: 2023-07-14 | Disposition: A | Discharge status: Home / Self Care | Payer: 59 | Source: Ambulatory Visit | Attending: Pain Medicine | Admitting: Pain Medicine

## 2023-07-14 DIAGNOSIS — M549 Dorsalgia, unspecified: Secondary | ICD-10-CM

## 2023-07-14 DIAGNOSIS — M4726 Other spondylosis with radiculopathy, lumbar region: Secondary | ICD-10-CM | POA: Insufficient documentation

## 2023-07-14 SURGERY — ~~LOC~~ OR MINOR EPIDURAL STEROID INJECTION
Anesthesia: Local (Nurse-Monitored) | Laterality: Left | Wound class: Clean Wound: Uninfected operative wounds in which no inflammation occurred

## 2023-07-14 MED ORDER — BUPIVACAINE (PF) 0.75 % (7.5 MG/ML) INJECTION SOLUTION
Freq: Once | INTRAMUSCULAR | Status: DC | PRN
Start: 2023-07-14 — End: 2023-07-14
  Administered 2023-07-14: 10 mL via INTRAMUSCULAR

## 2023-07-14 MED ORDER — IOPAMIDOL 200 MG IODINE/ML (41 %) INTRATHECAL SOLUTION
3.0000 mL | INTRATHECAL | Status: AC
Start: 2023-07-14 — End: 2023-07-14
  Administered 2023-07-14: 3 mL via EPIDURAL

## 2023-07-14 MED ORDER — TRIAMCINOLONE ACETONIDE 80 MG/ML SUSPENSION FOR INJECTION
Freq: Once | INTRAMUSCULAR | Status: DC | PRN
Start: 2023-07-14 — End: 2023-07-14
  Administered 2023-07-14: 40 mg via INTRAMUSCULAR

## 2023-07-14 MED ORDER — IOPAMIDOL 200 MG IODINE/ML (41 %) INTRATHECAL SOLUTION
Freq: Once | INTRATHECAL | Status: DC | PRN
Start: 2023-07-14 — End: 2023-07-14
  Administered 2023-07-14: 3 mL via INTRAMUSCULAR

## 2023-07-14 MED ORDER — LIDOCAINE HCL 10 MG/ML (1 %) INJECTION SOLUTION
Freq: Once | INTRAMUSCULAR | Status: DC | PRN
Start: 2023-07-14 — End: 2023-07-14
  Administered 2023-07-14: 5 mL via INTRAMUSCULAR

## 2023-07-14 MED ORDER — TRIAMCINOLONE ACETONIDE 40 MG/ML SUSPENSION FOR INJECTION
Freq: Once | INTRAMUSCULAR | Status: DC | PRN
Start: 2023-07-14 — End: 2023-07-14
  Administered 2023-07-14: 40 mg via INTRAMUSCULAR

## 2023-07-14 SURGICAL SUPPLY — 9 items
APPL ISPRP CHG 10.5ML CHLRPRP HI-LT ORNG PREP STRL LF (MED SURG SUPPLIES) ×1 IMPLANT
BAG 36X36IN BAND EQP (DRAPE/PACKS/SHEETS/OR TOWEL) IMPLANT
CONV USE ITEM 321827 - GLOVE SURG 7.5 LF PF CLS STRL_BRN TINT 12IN PROTEXIS (GLOVES AND ACCESSORIES) ×1 IMPLANT
DISC NO SUB - TRAY EPIDRL 28X22IN 18GA 27GA LL TUOHY 1.25IN ASST VOL OVAL 10% PVP IOD PLASTIC 1 SHOT NEEDLE STICK (MED SURG SUPPLIES) ×1 IMPLANT
NEEDLE EPIDRL YW 4.5IN 20GA TUOHY METAL PLASTIC BVL STY REM WNG SLIDE DEPTH INDICATOR STRL LF  DISP (MED SURG SUPPLIES) IMPLANT
NEEDLE SPINAL 3.5IN 22GA SPNCN QUINCKE BPA PVC FREE DEHP-FR STRL LF (MED SURG SUPPLIES) IMPLANT
NEEDLE SPINAL BLK 5IN 22GA QUINCKE LONG LGTH REG WL POLYPROP STRL LF  DISP (MED SURG SUPPLIES) ×2 IMPLANT
SYRINGE LL 20ML STRL GRAD MED DISP (MED SURG SUPPLIES) IMPLANT
TRAY UNIV NERVE BLOCK DISP SNG USE LF STRL (MED SURG SUPPLIES) IMPLANT

## 2023-07-14 NOTE — Nurses Notes (Signed)
[place] Pain Rating Scale     On a scale of 0-10, during the past 24 hours, pain has interfered with you usual activity: 8     On a scale of 0-10, during the past 24 hours, pain has interfered with your sleep: 4    On a scale of 0-10, during the past 24 hours, pain has affected your mood: 5     On a scale of 0-10, during the past 24 hours, pain has contributed to your stress: 5     On a scale of 0-10, what is your overall pain Rating: 5

## 2023-07-14 NOTE — Nurses Notes (Signed)
Meds on field, see MD notes for amounts used.

## 2023-07-14 NOTE — Discharge Instructions (Signed)
[facility]  AT [place] - [place], [place] [address] - Phone: ([phone])      Complications (Reasons to call):  . Temperature greater than 101 degrees  . Unusual redness or swelling at the injection site  . Persistent nausea or vomiting or headache  . Persistent weakness, numbness or bleeding  . Loss of bowel or bladder control  . If you are unable to reach your physician and your symptoms are severe, have yourself brought to the nearest Emergency Department or call 911    You may experience:  . You may also experience a temporary increase in the level of your pain  . You may experience weakness, tingling or heavy feelings in your legs the first few hours after your procedure, requiring you to be cautious when ambulating (walking).   . Do NOT drive a car or operate heavy machinery for 24 hours after your procedure    Medications:  Most medications held for your procedure may be resumed one day after the procedure. Please ask your physician if you have questions about specific medications or the procedure itself.    Comfort measures:  . You may use ice over the injection site  . AVOID HEAT for the first 24 hours  . After that ice or heat may be used as needed. DO NOT apply LONGER than 20 minutes and wait 20 minutes before reapplication  . Avoid sitting in a bathtub, hot tub, pool, etc. for 3 days after your procedure    Activity:  . Rest at home for the next 24 hours  . Then increase activity as tolerated  . Typically it is ok to return to work one day after the procedure      It is recommended that you do not receive any vaccinations 7 days prior to a pain clinic procedure, or 7 days after a pain clinic procedure. Side effects from the vaccine may be similar to those that you might experience as an adverse reaction to a pain clinic procedure.

## 2023-07-14 NOTE — OR Surgeon (Signed)
PATIENT NAME:  Sean Wall    MR#:  [id number]    DATE OF BIRTH:  November 03, 1955    DATE OF PROCEDURE:  07/14/23    PROCEDURE:  Left L4-5 and L5-S1 transforaminal epidural steroid injection under fluoroscopic guidance.    PERFORMING PHYSICIAN:  Lisbeth Renshaw, DO    PRE-PROCEDURE DIAGNOSIS:  Lumbar spondylosis, Lumbar radiculopathy, Low back pain    POST-PROCEDURE DIAGNOSIS:  Diagnosis unchanged    ANESTHESIA:  Local    ESTIMATED BLOOD LOSS:  Minimal    COMPLICATIONS:  None    CONSENT:  Today's procedure, its potential benefits as well as its risks and potential side effects were reviewed.  Discussed risks of the procedure include bleeding, infection, nerve irritation or damage, reactions medications administered, headache, failure of the pain to improve, and exacerbation of the pain.  These risks were explained to the patient, who verbalized understanding and who wished to proceed.  Informed consent was signed.    DESCRIPTION OF PROCEDURE:  After written informed consent was obtained, the patient was taken to the fluoroscopy suite and placed in the prone position.  Anatomical landmarks were identified by way of fluoroscopy in multiple views.  The skin of the lumbar region was prepped using antiseptic and draped in the usual sterile fashion.  Strict aseptic technique was utilized.  The skin and subcutaneous tissues at the needle entry site were infiltrated with a total of 5 mL of 1% preservative-free lidocaine using 25 gauge 1-1/2-inch needle.      A 22-gauge needle was then incrementally advanced under fluoroscopic guidance in the oblique view into the left L4-5 neural foramen.   Proper placement into the neural foramen was confirmed with fluoroscopy in both the lateral and AP views.  After negative aspiration for CSF or heme, Isovue 200 M contrast was injected with delineated the nerve root and the epidural space under fluoroscopy in the AP view.  There was only a transient pressure paresthesia that resolved immediately  upon injection.  After negative aspiration, a 2.7 mL injectate consisting of 1.7 mL of preservative-free 0.5% bupivacaine and 1 mL of Kenalog 40 mg/ml was slowly injected.       Attention was then directed to the left L5-S1 neural foramen.  A 22-gauge needle was then incrementally advanced under fluoroscopic guidance in the oblique view into the neural foramen.   Proper placement into the neural foramen was confirmed with fluoroscopy in both the lateral and AP views.  After negative aspiration for CSF or heme, Isovue 200 M contrast was injected with delineated the nerve root and the epidural space under fluoroscopy in the AP view.  There was only a transient pressure paresthesia that resolved immediately upon injection.  After negative aspiration, a 2.7 mL injectate consisting of 1.7 mL of preservative-free 0.5% bupivacaine and 1 mL of Kenalog 40 mg/ml was slowly injected.       The patient tolerated the procedure well and all needles were removed intact.  Hemostasis was maintained.  There were no apparent paresthesias or complications.  The skin was wiped clean, and a Band-Aid was placed as appropriate.  The patient was monitored for an appropriate period of time following the procedure and remained hemodynamically stable and neurovascularly intact.  The patient was ultimately discharged to home with supervision in good condition and instructed to follow up in the office in 14 days or sooner as warranted.     Lisbeth Renshaw, DO  07/14/2023, 13:08

## 2023-07-17 ENCOUNTER — Telehealth (HOSPITAL_BASED_OUTPATIENT_CLINIC_OR_DEPARTMENT_OTHER): Payer: Self-pay | Admitting: Pain Medicine

## 2023-07-17 NOTE — Telephone Encounter (Signed)
Spoke with pt regarding LEFT TFESI on 07/14/23. Pt reports 20-30% relief. Informed pt to contact us with any questions/concerns.  Arvid Right, MA

## 2023-07-31 ENCOUNTER — Ambulatory Visit (HOSPITAL_BASED_OUTPATIENT_CLINIC_OR_DEPARTMENT_OTHER): Payer: Self-pay | Admitting: Family

## 2023-08-29 ENCOUNTER — Ambulatory Visit (HOSPITAL_BASED_OUTPATIENT_CLINIC_OR_DEPARTMENT_OTHER): Payer: Self-pay | Admitting: Family

## 2023-10-10 ENCOUNTER — Encounter (HOSPITAL_BASED_OUTPATIENT_CLINIC_OR_DEPARTMENT_OTHER): Payer: Self-pay | Admitting: Gastroenterology

## 2023-11-02 ENCOUNTER — Ambulatory Visit (INDEPENDENT_AMBULATORY_CARE_PROVIDER_SITE_OTHER): Payer: Self-pay | Admitting: Neurology

## 2023-11-06 ENCOUNTER — Encounter (HOSPITAL_BASED_OUTPATIENT_CLINIC_OR_DEPARTMENT_OTHER): Payer: Self-pay | Admitting: Gastroenterology

## 2023-11-06 ENCOUNTER — Other Ambulatory Visit: Payer: Self-pay

## 2023-11-06 ENCOUNTER — Ambulatory Visit: Payer: Self-pay | Attending: Gastroenterology | Admitting: Gastroenterology

## 2023-11-06 VITALS — BP 130/87 | Ht 70.0 in | Wt 206.4 lb

## 2023-11-06 DIAGNOSIS — K219 Gastro-esophageal reflux disease without esophagitis: Secondary | ICD-10-CM | POA: Insufficient documentation

## 2023-11-06 DIAGNOSIS — R109 Unspecified abdominal pain: Secondary | ICD-10-CM | POA: Insufficient documentation

## 2023-11-06 MED ORDER — DICYCLOMINE 10 MG CAPSULE
10.0000 mg | ORAL_CAPSULE | Freq: Four times a day (QID) | ORAL | 0 refills | Status: AC | PRN
Start: 2023-11-06 — End: 2023-12-06

## 2023-11-06 NOTE — Progress Notes (Signed)
 [facility]  Patient ID: Sean Wall is a 68 y.o. male.   PCP: Anna Barnes, MD   Chief complaint: abdominal pain    History of Presenting Illness  This is a 68 year old man past medical history of GERD, IBS, hypertension, migraine, depression who presents here today for abdominal pain. He reports experiencing abdominal pain every 2 days. The pain is mostly present after eating. He takes Hydrocodone  for pain. Patient will occasionally have nausea. He does report having a smaller appetite when compared to previously. Patient takes Omeprazole at night. He is unsure if this changes his sxs. He had a UTI about a month ago and was on 2 different abx. Patient states that the pain started prior to that. Patient denies any unintentional weight loss, and there are no other complaints at this time.    From prior notes:   This is a 68 year old man past medical history of GERD, IBS, hypertension, migraine, depression who presents here today for follow-up. 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  Pathology was consistent with ischemic colitis.  Since his discharge he has been feeling well with no new complaints.  Patient has a baseline of abdominal discomfort and diarrhea which is chronic and has been going on for many years which he attributes to his IBS. Bowel habits are mainly diarrhea. Had a formed bowel motion last night the first time. He had an image on his phone which shows thin caliber formed stool.     Family History of Colon Polyps/Colon Cancer/GI Malignancies: None    Endoscopy History:   Colonoscopy 04/08/21     Non bleeding small internal hemorrhoids  Mucosal changes suggestive of ischemic colitis of the transverse colon and descending colon.  Normal terminal ileum mucosa  Boston Bowel Prep Score (BBPS) 7     Multiple EGDs in the past (7)    Review of Systems   Constitutional:   Positive for appetite change. Negative for activity change, chills and diaphoresis.   HENT:  Negative for congestion.    Eyes:  Negative for discharge.   Respiratory:  Negative for apnea, cough, choking, chest tightness, shortness of breath, wheezing and stridor.    Cardiovascular:  Negative for chest pain, palpitations and leg swelling.   Gastrointestinal:  Positive for abdominal pain. Negative for abdominal distention, anal bleeding, blood in stool, nausea, rectal pain and vomiting.   Endocrine: Negative for cold intolerance.   Genitourinary:  Negative for difficulty urinating.   Musculoskeletal:  Negative for arthralgias.   Skin:  Negative for color change and pallor.   Allergic/Immunologic: Negative for immunocompromised state.   Neurological:  Negative for dizziness.   Psychiatric/Behavioral:  Negative for behavioral problems and confusion.       All 14 ROS has been reviewed and negative except for what has been stated in the HPI.     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 Nigel Bart. Right 2018 Dr Loralyn Rochester CYST REMOVAL Right 1990    right wrist and right ankle. [employer]    HX LUMBAR FUSION  2018    hardware in place--Dr. Loralyn Rochester NOSE/SINUS SURGERY  2005     deviated septum repair. Dr. Tanja Fang SHOULDER SURGERY Bilateral     5 surgeries on right and 3 on the left    HX TURP      HX VASECTOMY      Dr. Derry Flock HERNIA REPAIR Right 1991    [employer]    LASIK Bilateral 2004          Family Medical History:       Problem Relation (Age of Onset)    Diabetes Mother    No Known Problems Father, Sister, Brother, Half-Sister, Half-Brother, Maternal Aunt, Maternal Uncle, Paternal Aunt, Paternal Uncle, Maternal Grandmother, Maternal Grandfather, Paternal Grandmother, Paternal Grandfather, Daughter, Son, Other             Social History     Socioeconomic History    Marital status: Married   Tobacco Use    Smoking status: Light Smoker     Types: Cigars    Smokeless tobacco: Never    Tobacco comments:     OCCASIONAL SMOKING-1 CIGAR EVERY 2 WEEKS   Vaping Use    Vaping status: Never Used   Substance and Sexual Activity    Alcohol use: Yes     Comment: ocassional    Drug use: Never   Other Topics Concern    Ability to Walk 1 Flight of Steps without SOB/CP Yes    Ability To Do Own ADL's Yes     Social Determinants of Health     Social Connections: Low Risk  (12/29/2022)    Social Connections     SDOH Social Isolation: 5 or more times a week    Received from [employer], [facility]    HITS      Allergies   Allergen Reactions    Propofol Rash    Lithium      Blurred vision        Current Outpatient Medications:     atorvastatin  (LIPITOR) 40 mg Oral Tablet, Take 1 Tablet (40 mg total) by mouth Every evening, Disp: , Rfl:     azelastine-fluticasone 137-50 mcg/spray Nasal Spray, Non-Aerosol, Administer 2 Sprays into affected nostril(s) Every night as needed Only uses if having a issue, Disp: , Rfl:     bisacodyL  (DULCOLAX) 5 mg Oral Tablet, Delayed Release (E.C.), Take 2 Tablets (10 mg total) by mouth Every 24 hours as needed for Constipation for up to 1 dose To be taken in the evening, Disp: 2 Tablet, Rfl: 0    buPROPion  (WELLBUTRIN  XL) 300 mg extended  release 24 hr tablet, Take 1 Tablet (300 mg total) by mouth Daily, Disp: , Rfl:  cetirizine (ZYRTEC) 10 mg Oral Tablet, Take 1 Tablet (10 mg total) by mouth Every night as needed for Allergies, Disp: , Rfl:     cyanocobalamin  (VITAMIN B12) 1,000 mcg/mL Injection Solution, 1 mL (1,000 mcg total) Every 30 days Takes on the 21st (Patient not taking: Reported on 11/06/2023), Disp: , Rfl:     cyclobenzaprine  (FLEXERIL ) 10 mg Oral Tablet, Take 1 Tablet (10 mg total) by mouth Three times a day as needed Usually take at one efery  HS, Disp: , Rfl:     dextroamphetamine-amphetamine (ADDERALL) 10 mg Oral Tablet, Take 1 Tablet (10 mg total) by mouth Once per day as needed for Other, Disp: , Rfl:     finasteride  (PROSCAR ) 5 mg Oral Tablet, Take 1 Tablet (5 mg total) by mouth Daily, Disp: , Rfl:     gabapentin  (NEURONTIN ) 300 mg Oral Capsule, Take 1 Capsule (300 mg total) by mouth Twice daily Takes bid (Patient not taking: Reported on 11/06/2023), Disp: , Rfl:     HERBAL DRUGS ORAL, Take 700 mg by mouth Daily PEPPERMINT LEAF, Disp: , Rfl:     HYDROcodone -acetaminophen  (NORCO) 5-325 mg Oral Tablet, Take 1 Tablet by mouth Every 8 hours as needed for Pain, Disp: , Rfl:     HYDROcodone -acetaminophen  (NORCO) 5-325 mg Oral Tablet, Take 1-2 Tablets by mouth Every 6 hours as needed for Pain, Disp: 20 Tablet, Rfl: 0    Ibuprofen (MOTRIN) 600 mg Oral Tablet, Take 1 Tablet (600 mg total) by mouth Four times a day as needed for Pain, Disp: , Rfl:     lactobacillus comb no.10 (PROBIOTIC) 20 billion cell Oral Capsule, Take 1 Capsule by mouth Once a day (Patient not taking: Reported on 11/06/2023), Disp: , Rfl:     melatonin 3 mg Oral Capsule, Take 2 Capsules (6 mg total) by mouth Every night, Disp: , Rfl:     metoprolol  succinate (TOPROL -XL) 25 mg Oral Tablet Sustained Release 24 hr, Take 0.5 Tablets (12.5 mg total) by mouth Daily, Disp: , Rfl:     pantoprazole  (PROTONIX ) 20 mg Oral Tablet, Delayed Release (E.C.), Take 1 Tablet (20 mg total)  by mouth Twice daily, Disp: , Rfl:     QUEtiapine  (SEROQUEL ) 50 mg Oral Tablet, Take 1 Tablet (50 mg total) by mouth Every night, Disp: , Rfl:     simethicone  (MYLICON) 80 mg Oral Tablet, Chewable, Chew 1 Tablet (80 mg total) Every 6 hours as needed, Disp: , Rfl:     sumatriptan  succinate (IMITREX ) 100 mg Oral Tablet, Take 1 Tablet (100 mg total) by mouth Once, as needed for Migraine May repeat in 2 hours in needed, Disp: , Rfl:     tadalafil  (CIALIS ) 20 mg Oral Tablet, Take 1 Tablet (20 mg total) by mouth Every 24 hours as needed, Disp: 30 Tablet, Rfl: 4    tadalafil  (CIALIS ) 20 mg Oral Tablet, Take 1 Tablet (20 mg total) by mouth Every 24 hours as needed, Disp: 30 Tablet, Rfl: 3    Testosterone 1 % (25 mg/2.5gram) Transdermal Gel in Packet, Place 2 Each on the skin Once a day 2 pumps each shoulder daily am, Disp: , Rfl:      Objective:   Vitals:    11/06/23 1509   BP: 130/87   Weight: 93.6 kg (206 lb 5.6 oz)   Height: 1.778 m (5\' 10" )   BMI: 29.61          Body mass index is 29.61 kg/m.   Physical Exam  Vitals  and nursing note reviewed.   Constitutional:       Appearance: Normal appearance.   HENT:      Head: Normocephalic and atraumatic.      Nose: Nose normal.      Mouth/Throat:      Mouth: Mucous membranes are moist.   Eyes:      Extraocular Movements: Extraocular movements intact.      Pupils: Pupils are equal, round, and reactive to light.   Cardiovascular:      Rate and Rhythm: Normal rate and regular rhythm.   Pulmonary:      Effort: Pulmonary effort is normal. No respiratory distress.   Abdominal:      General: Abdomen is flat.      Palpations: Abdomen is soft.   Musculoskeletal:         General: Normal range of motion.      Cervical back: Normal range of motion.   Skin:     General: Skin is warm.   Neurological:      General: No focal deficit present.      Mental Status: He is alert and oriented to person, place, and time. Mental status is at baseline.   Psychiatric:         Mood and Affect: Mood normal.          Behavior: Behavior normal.          Labs/Radiology   No results were found from the past 30 days.  No results found for this or any previous visit (from the past 6 weeks).    Assessment:    This is a 68 year old man past medical history of GERD, IBS, hypertension, migraine, depression who presents here today for abdominal pain. He reports experiencing abdominal pain every 2 days. The pain is mostly present after eating. He takes Hydrocodone  for pain. Patient will occasionally have nausea. He does report having a smaller appetite when compared to previously. Patient takes Omeprazole at night. He is unsure if this changes his sxs. He had a UTI about a month ago and was on 2 different abx. Patient states that the pain started prior to that. Patient denies any unintentional weight loss, and there are no other complaints at this time.      Antithrombotics/Anticoagulants:  None  Relevant Surgical History:  None  Cardio-Pulm History/Clearance:  None    Plan:     1. Ischemic colitis  Resolved  Feeling well     2. IBS - Mixed type  Low FODMAP diet  Imodium as needed  Fiber supplement     3. CRC Screening:  Colonoscopy 03/2021 - no polyps  Ischemic colitis  Patient is in agreement with colonoscopy   Colonoscopy prep with golytely   Instructions explained and hard copy given to the patient who verbalized understanding  Clearances as noted  Withold anticoagulants and anti-platelets as needed       4. HCV Screening:  USPTF recommends one time screening for HCV in adults age 3-79  To be done by PCP    5. Abdominal pain  Trial of Bentyl   Will schedule EGD    Follow up 3 months      ICD-10-CM    1. Abdominal pain, unspecified abdominal location  R10.9 NUC GASTRIC EMPTYING STUDY     dicyclomine  (BENTYL ) 10 mg Oral Capsule     CASE REQUEST SURGICAL: GASTROSCOPY      2. Chronic GERD  K21.9  I am scribing for, and in the presence of, Dr. Melynda Stagger for services provided on 11/06/2023.  Michelle Foggin, SCRIBE    Michelle Foggin, [place]    I personally performed the services described in this documentation, as scribed  in my presence, and it is both accurate  and complete.    Melynda Stagger, MD

## 2023-11-07 ENCOUNTER — Encounter (HOSPITAL_BASED_OUTPATIENT_CLINIC_OR_DEPARTMENT_OTHER): Payer: Self-pay | Admitting: Gastroenterology

## 2023-11-07 NOTE — Nursing Note (Signed)
 Per scanned fax from the [place], [place] for EGD scheduled 12/01/23 w/Dr. Lyman Sander has been approved & is valid 11/06/23 through 03/05/24. PA # [id number].    Para Bold, LPN 96/04/54 09:81

## 2023-11-14 HISTORY — PX: ESOPHAGOSCOPY / EGD: SUR461

## 2023-11-23 ENCOUNTER — Other Ambulatory Visit: Payer: Self-pay

## 2023-11-23 ENCOUNTER — Ambulatory Visit
Admission: RE | Admit: 2023-11-23 | Discharge: 2023-11-23 | Disposition: A | Discharge status: Home / Self Care | Source: Ambulatory Visit | Attending: Gastroenterology | Admitting: Gastroenterology

## 2023-11-23 DIAGNOSIS — R109 Unspecified abdominal pain: Secondary | ICD-10-CM | POA: Insufficient documentation

## 2023-11-29 ENCOUNTER — Other Ambulatory Visit: Payer: Self-pay

## 2023-11-29 ENCOUNTER — Ambulatory Visit (HOSPITAL_BASED_OUTPATIENT_CLINIC_OR_DEPARTMENT_OTHER): Payer: Self-pay | Admitting: Gastroenterology

## 2023-11-29 ENCOUNTER — Ambulatory Visit
Admission: RE | Admit: 2023-11-29 | Discharge: 2023-11-29 | Disposition: A | Discharge status: Home / Self Care | Source: Ambulatory Visit

## 2023-11-29 ENCOUNTER — Encounter (HOSPITAL_COMMUNITY): Payer: Self-pay

## 2023-11-29 HISTORY — DX: Irritable bowel syndrome, unspecified: K58.9

## 2023-12-01 ENCOUNTER — Ambulatory Visit (HOSPITAL_COMMUNITY)

## 2023-12-01 ENCOUNTER — Ambulatory Visit
Admission: RE | Admit: 2023-12-01 | Discharge: 2023-12-01 | Disposition: A | Discharge status: Home / Self Care | Source: Ambulatory Visit | Attending: Gastroenterology | Admitting: Gastroenterology

## 2023-12-01 ENCOUNTER — Encounter (HOSPITAL_COMMUNITY)
Admission: RE | Disposition: A | Discharge status: Home / Self Care | Payer: Self-pay | Source: Ambulatory Visit | Attending: Gastroenterology

## 2023-12-01 DIAGNOSIS — M539 Dorsopathy, unspecified: Secondary | ICD-10-CM | POA: Insufficient documentation

## 2023-12-01 DIAGNOSIS — G4730 Sleep apnea, unspecified: Secondary | ICD-10-CM | POA: Insufficient documentation

## 2023-12-01 DIAGNOSIS — R519 Headache, unspecified: Secondary | ICD-10-CM | POA: Insufficient documentation

## 2023-12-01 DIAGNOSIS — K2100 Gastro-esophageal reflux disease with esophagitis, without bleeding: Secondary | ICD-10-CM | POA: Insufficient documentation

## 2023-12-01 DIAGNOSIS — I10 Essential (primary) hypertension: Secondary | ICD-10-CM | POA: Insufficient documentation

## 2023-12-01 DIAGNOSIS — Z8673 Personal history of transient ischemic attack (TIA), and cerebral infarction without residual deficits: Secondary | ICD-10-CM | POA: Insufficient documentation

## 2023-12-01 DIAGNOSIS — Z79899 Other long term (current) drug therapy: Secondary | ICD-10-CM | POA: Insufficient documentation

## 2023-12-01 DIAGNOSIS — K2970 Gastritis, unspecified, without bleeding: Secondary | ICD-10-CM | POA: Insufficient documentation

## 2023-12-01 DIAGNOSIS — E785 Hyperlipidemia, unspecified: Secondary | ICD-10-CM | POA: Insufficient documentation

## 2023-12-01 DIAGNOSIS — R109 Unspecified abdominal pain: Secondary | ICD-10-CM | POA: Insufficient documentation

## 2023-12-01 SURGERY — GASTROSCOPY WITH BIOPSY
Anesthesia: General | Wound class: Clean Contaminated Wounds-The respiratory, GI, Genital, or urinary

## 2023-12-01 MED ORDER — SODIUM CHLORIDE 0.9% FLUSH BAG - 250 ML
INTRAVENOUS | Status: DC | PRN
Start: 2023-12-01 — End: 2023-12-01

## 2023-12-01 MED ORDER — LACTATED RINGERS INTRAVENOUS SOLUTION
INTRAVENOUS | Status: DC | PRN
Start: 2023-12-01 — End: 2023-12-01

## 2023-12-01 MED ORDER — MIDAZOLAM 1 MG/ML INJECTION WRAPPER
INTRAMUSCULAR | Status: AC
Start: 2023-12-01 — End: 2023-12-01
  Filled 2023-12-01: qty 2

## 2023-12-01 MED ORDER — LACTATED RINGERS INTRAVENOUS SOLUTION
1000.0000 mL | INTRAVENOUS | Status: DC
Start: 2023-12-01 — End: 2023-12-01
  Administered 2023-12-01: 1000 mL via INTRAVENOUS

## 2023-12-01 MED ORDER — ETOMIDATE 2 MG/ML INTRAVENOUS SOLUTION
Freq: Once | INTRAVENOUS | Status: DC | PRN
Start: 2023-12-01 — End: 2023-12-01
  Administered 2023-12-01: 8 mg via INTRAVENOUS

## 2023-12-01 MED ORDER — FENTANYL (PF) 50 MCG/ML INJECTION SOLUTION
Freq: Once | INTRAMUSCULAR | Status: DC | PRN
Start: 2023-12-01 — End: 2023-12-01
  Administered 2023-12-01: 50 ug via INTRAVENOUS

## 2023-12-01 MED ORDER — DEXTROSE 5% IN WATER (D5W) FLUSH BAG - 250 ML
INTRAVENOUS | Status: DC | PRN
Start: 2023-12-01 — End: 2023-12-01

## 2023-12-01 MED ORDER — SODIUM CHLORIDE 0.9 % (FLUSH) INJECTION SYRINGE
3.0000 mL | INJECTION | INTRAMUSCULAR | Status: DC | PRN
Start: 2023-12-01 — End: 2023-12-01

## 2023-12-01 MED ORDER — MIDAZOLAM 1 MG/ML INJECTION SOLUTION
Freq: Once | INTRAMUSCULAR | Status: DC | PRN
Start: 2023-12-01 — End: 2023-12-01
  Administered 2023-12-01: 2 mg via INTRAVENOUS

## 2023-12-01 MED ORDER — ONDANSETRON HCL (PF) 4 MG/2 ML INJECTION SOLUTION
Freq: Once | INTRAMUSCULAR | Status: DC | PRN
Start: 2023-12-01 — End: 2023-12-01
  Administered 2023-12-01: 4 mg via INTRAVENOUS

## 2023-12-01 MED ORDER — FENTANYL (PF) 50 MCG/ML INJECTION SOLUTION
INTRAMUSCULAR | Status: AC
Start: 2023-12-01 — End: 2023-12-01
  Filled 2023-12-01: qty 2

## 2023-12-01 MED ORDER — SODIUM CHLORIDE 0.9 % (FLUSH) INJECTION SYRINGE
3.0000 mL | INJECTION | Freq: Three times a day (TID) | INTRAMUSCULAR | Status: DC
Start: 2023-12-01 — End: 2023-12-01

## 2023-12-01 SURGICAL SUPPLY — 46 items
AMPULE LAB ELEVIEW 10ML ENDOS RESCT PROC STRL LF DISP (MED SURG SUPPLIES) IMPLANT
APPL ENDOS 40CM 360D SPRAY SET SNPLK GAS AST TIP DUPLOSPRAY MIS LF (ENDOSCOPIC SUPPLIES) IMPLANT
CAP ENDOSCP SEAL 9.8-11.1MM MED RF ABLATION FLXB BARRX GIF-180 STRL DISP (ENDOSCOPIC SUPPLIES) IMPLANT
CAPSULE PH BRAVO CALIBRATE FREE RFLX DEL SYS (MED SURG SUPPLIES) IMPLANT
CAPSULE VIDEO PILLCAM PAT NONST LF  DISP (ENDOSCOPIC SUPPLIES) IMPLANT
CATH ABLATION BARRX 4MM 160CM 20X13MM 90D BIPOLAR ELECTRODE BAL FOCL RF ESPH ACPT 8.6-12.8MM SCP (ENDOSCOPIC SUPPLIES) IMPLANT
CATH ABLATION RF (CATHETERS) IMPLANT
CATH CRE 6-7-8MM 7.5FR 5.5CM 240CM 2.8MM 3.2MM LOW PROF GW BAL DIL ESOPH PYL BIL PEBAX STRL LF  DISP (ENDOSCOPIC SUPPLIES) IMPLANT
CATH ELHMST GLD PROBE 7FR 300CM BIPOLAR RND DIST TIP STD CONN FIRM SHAFT HMGLD STRL DISP 2.8MM MN (ENDOSCOPIC SUPPLIES) IMPLANT
CATH ENDOS BARRX 2.8MM 135CM FLXB CHNL RF ABLATION ESPH DISP (MED SURG SUPPLIES) IMPLANT
CATH PH MONITORING VERSAFLEX Z 6FR ZNIS+8R 8 RING DISP (MED SURG SUPPLIES) IMPLANT
CLIP HMST 2.8MM REPST DURACLIP 235CM 16MM CSCP (ENDOSCOPIC SUPPLIES) IMPLANT
CLIP HMST MR CONDITIONAL BRD CATH ROT CONTROL KNOB NO SHEATH RSL 360 235CM 2.8MM 11MM OPN (ENDOSCOPIC SUPPLIES) IMPLANT
CLIP HMST RADOPQ PRELD STRL DISP RSL 235CM 2.8MM 11MM OPN (ENDOSCOPIC SUPPLIES) IMPLANT
CLIP LGT RSL 360 ULTRA 235CM B_RD ROT CONTROL KNOB 17MM OPN (ENDOSCOPIC SUPPLIES) IMPLANT
DILATOR ENDOS CRE 180CM 8CM 10-11-12MM 6FR ESOPH BAL LOW PROF FIX WRE PEBAX STRL LF  DISP 2.8MM (ENDOSCOPIC SUPPLIES) IMPLANT
DILATOR ENDOS CRE 240CM 5.5CM 11-13.5-15MM 7.5FR ESOPH PYL BIL BAL LOW PROF GW PEBAX STRL LF  DISP (ENDOSCOPIC SUPPLIES) IMPLANT
DILATOR ENDOS CRE 240CM 5.5CM 15-16.5-18MM 7.5FR ESOPH PYL BIL BAL LOW PROF GW PEBAX STRL LF  DISP (ENDOSCOPIC SUPPLIES) IMPLANT
DILATOR ENDOS CRE 240CM 5.5CM 18-19-20MM 7.5FR ESOPH PYL BIL BAL LOW PROF GW PEBAX STRL LF  DISP 2.8 (ENDOSCOPIC SUPPLIES) IMPLANT
DILATOR ENDOS CRE 240CM 5.5CM 8-9-10MM 7.5FR ESOPH PYL BIL BAL LOW PROF GW PEBAX STRL LF  DISP 2.8MM (ENDOSCOPIC SUPPLIES) IMPLANT
DISC NO SUB - FORCEPS BIOPSY MLT SAMPLE 240CM 2.4MM MTBT 2.8MM STRL DISP ORNG (ENDOSCOPIC SUPPLIES) IMPLANT
DISCONTINUED USE ITEM 344596 - WATER STRL 500ML BTL LF (MED SURG SUPPLIES) IMPLANT
ELECTRODE ESURG 85SQ CM 4MR NESSY OMG NONST DISP RTN PLATE NEUTRALIZE CABLE PLUG LF (MED SURG SUPPLIES) IMPLANT
ELEVIEW 10CC AMPOULES (MED SURG SUPPLIES) IMPLANT
FORCEPS BIOPSY 160CM 1.8MM RJ 4 PED 2+ MM DISP GASTROSCOPIC (ENDOSCOPIC SUPPLIES) IMPLANT
FORCEPS ENDOS HOT PRCS BITE 24_0CM 2.8MM 2.2MM RJ 4 CUP DISP (ENDOSCOPIC SUPPLIES) IMPLANT
FORCEPS ESURG 230X4MM COAGRASPER HMST STRL LF  DISP (ENDOSCOPIC SUPPLIES) IMPLANT
GW ENDOS .038IN 260CM BARRX RFA STR DIST TIP FLXB SS STRL DISP (ENDOSCOPIC SUPPLIES) IMPLANT
GW URO 200CM SAVARY FLX TP SS NONST (ENDOSCOPIC SUPPLIES) IMPLANT
KIT JEJUN 45CM 22FR SIL MIC SECURLOK LL-SLIP RADOPQ UNIV FEED PORT CONN INTERNAL RETENTION BAL TAPER (PEG) IMPLANT
KIT PEG 20FR SIL PUL RADOPQ ME_D PORT MIC SECURLOK 12ML STRL IMPLANT
LIGATOR 2.8MM 8.6-11.5MM SSS7 ESOPH 1 STNG MLT BAND HNDL STRL DISP ENDOS HMSTS LF (ENDOSCOPIC SUPPLIES) IMPLANT
MARKER ENDOS SPOT EX PERM IND DRK SYRG 5ML (MED SURG SUPPLIES) IMPLANT
NEEDLE SCLRTX 25GA 1.8MM CLR 4MM 200CM (ENDOSCOPIC SUPPLIES) IMPLANT
NEEDLE SCLRTX 25GA 2.3MM BVL STRL DISP STAR CATH INTJCT 4MM 240CM (ENDOSCOPIC SUPPLIES) IMPLANT
NEEDLE SCLRTX 25GA 2.3MM CSCP OPTC TIP STRL LF  DISP FLXTP 5MM 230CM STD (ENDOSCOPIC SUPPLIES) IMPLANT
NEEDLE SCLRTX 25GA 2.3MM STRL DISP FLXTP 5MM 160CM STD (ENDOSCOPIC SUPPLIES) IMPLANT
PROBE COAG 7.2FT 6.9FR FIAPC CRCMF PLUG PLAY FUNCTIONALITY FILTER (SURGICAL CUTTING SUPPLIES) IMPLANT
PROBE ESURG 300CM 2.3MM FIAPC FLXB CRCMF STRL DISP (SURGICAL CUTTING SUPPLIES) IMPLANT
RETRIEVER ENDOS BLU 230CM 2.5MM RESCUENET PROMESH NT LOOP SHEATH 5.5X3CM 2.8CM MN WRK CHNL (ENDOSCOPIC SUPPLIES) IMPLANT
SET ENDOS INSTR 220CM 8.5-11MM OTSC 6MM NITINOL STRL LF  DISP (ENDOSCOPIC SUPPLIES) ×1 IMPLANT
SNARE RND 240CM 2.4MM CAPTIVATR COLD STF THN WRE ENDOS PLPCTM 10MM DISP (ENDOSCOPIC SUPPLIES) IMPLANT
SYRINGE INFLAT ALN II GA STRL DISP 60ML (ENDOSCOPIC SUPPLIES) IMPLANT
TIP APPL 40CM SS DUPLOSPRAY (MED SURG SUPPLIES) IMPLANT
USE ITEM 343174 SNARE RND 240CM 2.4MM CAPTIVATR COLD STF THN WRE ENDOS PLPCTM 10MM DISP (ENDOSCOPIC SUPPLIES) IMPLANT
WATER STRL 500ML BTL LF (MED SURG SUPPLIES) IMPLANT

## 2023-12-01 NOTE — Anesthesia Postprocedure Evaluation (Signed)
 Anesthesia Post Op Evaluation    Patient: Sean Wall  Procedure(s):  GASTROSCOPY WITH BIOPSY    Last Vitals:Temperature: 36.3 C (97.3 F) (12/01/23 0856)  Heart Rate: 58 (12/01/23 1030)  BP (Non-Invasive): 124/63 (12/01/23 1015)  Respiratory Rate: 16 (12/01/23 1030)  SpO2: 95 % (12/01/23 1030)    No notable events documented.    Patient is sufficiently recovered from the effects of anesthesia to participate in the evaluation and has returned to their pre-procedure level.  Patient location during evaluation: PACU       Patient participation: complete - patient participated  Level of consciousness: awake and alert and responsive to verbal stimuli    Pain management: adequate  Airway patency: patent    Anesthetic complications: no  Cardiovascular status: acceptable  Respiratory status: acceptable  Hydration status: acceptable  Patient post-procedure temperature: Pt Normothermic   PONV Status: Absent

## 2023-12-01 NOTE — Discharge Instructions (Signed)
 ESOPHAGOGASTRODUODENOSCOPY    RESTRICTION ON ACTIVITY  IF SEDATED:  Do not drive a car or operate machinery until the day after the procedure.  Following day: Return to full activity including work.    DIET:  Eat and drink normally unless instructed otherwise.    Treatment for common after effects:    Sore Throat: Treat with Throat Lozenges                          Gargle with warm salt water  Mild Abdominal pain and bloating: Rest and take only Liquids.    Symptoms to watch for and report to your physician:  Chills or fever occurring within 24 hours after procedure  Severe abdominal pain or bloating  Pain in the chest.      If you have any problems, questions or concerns contact your physician.  If you are unable to reach your physician and feel you need immediate attention, go to the Emergency Room.

## 2023-12-01 NOTE — Anesthesia Transfer of Care (Signed)
 ANESTHESIA TRANSFER OF CARE   Sean Wall is a 68 y.o. ,male,     had Procedure(s):  GASTROSCOPY WITH BIOPSY  performed  12/01/23   Primary Service: Melynda Stagger, MD    Past Medical History:   Diagnosis Date   . Acid reflux    . CPAP (continuous positive airway pressure) dependence    . Enlarged prostate    . Epigastric pain 12/29/2022   . Erectile dysfunction    . H/O hearing loss    . Headache    . Health education/counseling 12/30/2022   . Hemorrhoids    . 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 HTN (hypertension)    . Hyperlipidemia    . Hypotension, unspecified hypotension type 12/30/2022   . IBS (irritable bowel syndrome)    . Ischemic colitis (CMS HCC)    . Sleep apnea    . Stroke-like symptom 12/29/2022   . Syncope 12/29/2022   . Tinnitus    . Wears glasses       Allergy History as of 12/01/23       PROPOFOL         Noted Status Severity Type Reaction    11/29/23 0918 Steffan Edison, RN 04/08/21 Active High  Rash,  Other Adverse Reaction (Add comment)    Comments: High temperature for about 2 weeks (103-104 degree temp); Rash     11/29/23 0849 Steffan Edison, RN 04/08/21 Active Medium  Rash,  Other Adverse Reaction (Add comment)    Comments: High temperature for about 2 weeks of 103-104 degrees; Rash     04/08/21 0848 Rockey Church, RN 04/08/21 Active Medium  Rash              LITHIUM         Noted Status Severity Type Reaction    11/29/23 0847 Steffan Edison, RN 11/12/21 Active Medium   Other Adverse Reaction (Add comment)    Comments: Blurred vision     11/12/21 0754 Kathryn Parish, MA 11/12/21 Active       Comments: Blurred vision                   I completed my transfer of care / handoff to the receiving personnel during which we discussed:  Access, Airway, All key/critical aspects of case discussed, Analgesia, Antibiotics, Expectation of post procedure, Fluids/Product, Gave opportunity for  questions and acknowledgement of understanding, Labs and PMHx      Post Location: Phase II                                                           Last OR Temp: Temperature: 36.3 C (97.3 F)  ABG:  POTASSIUM   Date Value Ref Range Status   12/31/2022 4.2 3.5 - 5.1 mmol/L Final     KETONES   Date Value Ref Range Status   12/29/2022 Not Detected Not Detected mg/dL Final     CALCIUM   Date Value Ref Range Status   12/31/2022 8.9 8.6 - 10.3 mg/dL Final     Comment:     Gadolinium-containing contrast can interfere with calcium measurement.       Calculated P Axis   Date  Value Ref Range Status   12/29/2022 74 degrees Final     Calculated R Axis   Date Value Ref Range Status   12/29/2022 18 degrees Final     Calculated T Axis   Date Value Ref Range Status   12/29/2022 6 degrees Final     Airway:* No LDAs found *  Blood pressure (!) 158/80, pulse 64, temperature 36.3 C (97.3 F), resp. rate (!) 22, SpO2 91%.

## 2023-12-01 NOTE — Anesthesia Preprocedure Evaluation (Signed)
 ANESTHESIA PRE-OP EVALUATION  Planned Procedure: GASTROSCOPY  Review of Systems  Anesthesia Complications comment: Patient has received propofol in the past with noted rash during multiple procedures.  No formal allergy testing performed    anesthesia history negative     patient summary reviewed          Pulmonary   sleep apnea,  no COPD and no asthma   Cardiovascular    Hypertension, well controlled and hyperlipidemia ,No CAD,  Exercise Tolerance: > or = 4 METS   ,beta blocker therapy  ,taken in last 24 hours     GI/Hepatic/Renal    GERD no liver disease and no renal insufficiency        Endo/Other   neg endo/other ROS,  no hypothyroidism and no drug induced coagulopathy   no type 2 diabetes,    Neuro/Psych/MS    CVA (Symptom), headaches, back abnormality no seizures       Cancer    negative hematology/oncology ROS,               Physical Assessment      Airway       Mallampati: III    TM distance: >3 FB    Neck ROM: full  Mouth Opening: good.            Dental       Dentition intact             Pulmonary    Breath sounds clear to auscultation  (-) no rhonchi, no decreased breath sounds, no wheezes, no rales and no stridor     Cardiovascular    Rhythm: regular  Rate: Normal  (-) no friction rub and no murmur     Other findings          Plan  ASA 2     Planned anesthesia type: general     general intravenous                    Intravenous induction     Anesthesia issues/risks discussed are: Intraoperative Awareness/ Recall, Aspiration, PONV, Cardiac Events/MI, Post-op Pain Management and Stroke.  Anesthetic plan and risks discussed with patient  signed consent obtained          Patient's NPO status is appropriate for Anesthesia.           Plan discussed with CRNA.

## 2023-12-01 NOTE — H&P (Signed)
 Gastroenterology H&P     Sean Wall, 68 y.o., male    Date of service: 12/01/23  Date of Birth:  August 04, 1956    Referring Physician: Anna Barnes, MD    Chief Complaint:  GERD and abdominal pain    History of Present Illness:      Sean Wall is a 67 y.o.male patient with significant PMH listed below here for GERD and abdominal pain.     Denies fevers, chills, nausea, vomiting, heartburn, reflux, melena, hematochezia, or unintentional weight loss.     Review of Systems:  ROS otherwise negative except for pertinent positives discussed in HPI above.    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 Nigel Bart. Right 2018 Dr Loralyn Rochester CYST REMOVAL Right 1990    right wrist and right ankle. [employer]    HX LUMBAR FUSION  2018    hardware in place--Dr. Loralyn Rochester NOSE/SINUS SURGERY  2005    deviated septum repair. Dr. Tanja Fang SHOULDER SURGERY Bilateral     5 surgeries on right and 3 on the left    HX TURP      HX VASECTOMY      Dr. Derry Flock HERNIA REPAIR Right 1991    [employer]    LASIK Bilateral 2004    ULNAR TUNNEL RELEASE Left          Family Medical History:       Problem Relation (Age of  Onset)    Diabetes Mother    No Known Problems Father, Sister, Brother, Half-Sister, Half-Brother, Maternal Aunt, Maternal Uncle, Paternal Aunt, Paternal Uncle, Maternal Grandmother, Maternal Grandfather, Paternal Grandmother, Paternal Grandfather, Daughter, Son, Other            Social History     Socioeconomic History    Marital status: Married   Tobacco Use    Smoking status: Light Smoker     Types: Cigars    Smokeless tobacco: Never    Tobacco comments:     OCCASIONAL SMOKING-1 CIGAR EVERY 2-4 WEEKS   Vaping Use    Vaping status: Never Used   Substance and Sexual Activity    Alcohol use: Yes     Comment: Occasional    Drug use: Never   Other Topics Concern    Ability to Walk 1  Flight of Steps without SOB/CP Yes    Ability to Walk 2 Flight of Steps without SOB/CP Yes    Ability To Do Own ADL's Yes     Social Determinants of Health     Social Connections: Low Risk  (12/29/2022)    Social Connections     SDOH Social Isolation: 5 or more times a week    Received from [employer], [facility]    HITS     Expanded Substance History     Additional history     Allergies   Allergen Reactions    Propofol Rash and  Other Adverse Reaction (Add comment)     High temperature for about 2 weeks (103-104 degree temp); Rash    Lithium  Other Adverse Reaction (Add comment)     Blurred vision     No outpatient medications have been marked as taking for the 12/01/23 encounter [facility] Encounter).     No current facility-administered medications for this encounter.      Objective:    Physical Exam:  There were no vitals taken for this visit.         General Appearance: The patient is lying in bed in no acute distress.    ENT: PERL EOMI, nose and mouth mucosa unremarkable   Heart: S1-S2 normal, no murmurs  Lungs: clear to auscultation bilaterally, no crackles  Abdomen: soft, non-tender to palpation, no rebound tenderness or guarding, bowel sounds present  Neurological: alert oriented x3, no focal deficit  Extremities: no edema  noted  Skin: no rashes  Joints: no deformities     Labs:  No visits with results within 1 Day(s) from this visit.   Latest known visit with results is:   Admission on 12/29/2022, Discharged on 12/31/2022   Component Date Value Ref Range Status    TROPONIN-I HS 12/29/2022 <2.7  <=35.0 ng/L ng/L Final    Ventricular rate 12/29/2022 69  BPM Final    Atrial Rate 12/29/2022 69  BPM Final    PR Interval 12/29/2022 158  ms Final    QRS Duration 12/29/2022 90  ms Final    QT Interval 12/29/2022 376  ms Final    QTC Calculation 12/29/2022 402  ms Final    Calculated P Axis 12/29/2022 65  degrees Final    Calculated R Axis 12/29/2022 8  degrees Final    Calculated T Axis 12/29/2022 -7  degrees Final    TROPONIN-I HS 12/29/2022 <2.7  <=35.0 ng/L ng/L Final    Ventricular rate 12/29/2022 64  BPM Final    Atrial Rate 12/29/2022 64  BPM Final    PR Interval 12/29/2022 136  ms Final    QRS Duration 12/29/2022 82  ms Final    QT Interval 12/29/2022 388  ms Final    QTC Calculation 12/29/2022 400  ms Final    Calculated P Axis 12/29/2022 63  degrees Final    Calculated R Axis 12/29/2022 7  degrees Final    Calculated T Axis 12/29/2022 -8  degrees Final    TROPONIN-I HS 12/29/2022 <2.7  <=35.0 ng/L ng/L Final    Ventricular rate 12/29/2022 62  BPM Final    Atrial Rate 12/29/2022 62  BPM Final    PR Interval 12/29/2022 166  ms Final    QRS Duration 12/29/2022 100  ms Final    QT Interval 12/29/2022 404  ms Final    QTC Calculation 12/29/2022 410  ms Final    Calculated P Axis  12/29/2022 74  degrees Final    Calculated R Axis 12/29/2022 18  degrees Final    Calculated T Axis 12/29/2022 6  degrees Final    SODIUM 12/29/2022 142  136 - 145 mmol/L Final    POTASSIUM 12/29/2022 4.0  3.5 - 5.1 mmol/L Final    CHLORIDE 12/29/2022 106  96 - 111 mmol/L Final    CO2 TOTAL 12/29/2022 26  23 - 31 mmol/L Final    ANION GAP 12/29/2022 10  4 - 13 mmol/L Final    CALCIUM 12/29/2022 9.5  8.6 - 10.3 mg/dL Final    Gadolinium-containing contrast can  interfere with calcium measurement.      GLUCOSE 12/29/2022 117  65 - 125 mg/dL Final    BUN 16/05/9603 21  8 - 25 mg/dL Final    CREATININE 54/04/8118 1.09  0.75 - 1.35 mg/dL Final    BUN/CREA RATIO 12/29/2022 19  6 - 22 Final    ESTIMATED GFR - MALE 12/29/2022 73  >=60 mL/min/BSA Final    Estimated Glomerular Filtration Rate (eGFR) is calculated using the gender-dependent CKD-EPI (2021) equation, intended for patients 84 years of age and older.    If patient sex is not documented, or if patient sex and gender are incongruent, eGFR results calculated for both male and male will be reported. Recommend correlation and careful interpretation of patient history, keeping in mind that serum creatinine is influenced by hormone therapy.    Stage, GFR, Classification   G1, 90, Normal or High   G2, 60-89, Mildly decreased   G3a, 45-59, Mildly to moderately decreased   G3b, 30-44, Moderately to severely decreased   G4, 15-29, Severely decreased   G5, <15, Kidney failure   In the absence of kidney damage, neither G1 or G2 fulfill criteria for CKD per KDIGO.      ALBUMIN 12/29/2022 3.8  3.4 - 4.8 g/dL  Final    ALKALINE PHOSPHATASE 12/29/2022 66  45 - 115 U/L Final    ALT (SGPT) 12/29/2022 28  10 - 55 U/L Final    AST (SGOT)  12/29/2022 17  8 - 45 U/L Final    BILIRUBIN TOTAL 12/29/2022 0.6  0.3 - 1.3 mg/dL Final    Naproxen therapy can falsely elevate total bilirubin levels.    BILIRUBIN DIRECT 12/29/2022 0.2  0.1 - 0.4 mg/dL Final    PROTEIN TOTAL 12/29/2022 6.9  6.0 - 8.0 g/dL Final    LIPASE 14/78/2956 31  10 - 60 U/L Final    APTT 12/29/2022 31.7  26.0 - 39.0 seconds Final    PROTHROMBIN TIME 12/29/2022 12.5  9.7 - 13.6 seconds Final    INR 12/29/2022 1.06  <=5.00 Final    Low ISI PT Reagent effective 09/30/96    INR Critical = > 5.0    Recommended therapeutic range for oral anticoagulants     Prophylaxis / Treatment of:                   INR  _______________________________________   __________      Venous Thrombosis,  Pulmonary Embolism    2.0 - 3.0      Prevention of Systemic Embolism from:         INR  _______________________________________   ___________      Atrial Fibrillation                      2.0 - 3.0    Myocardial Infarction  2.0 - 3.0    Mechanical Prosthetic Heart Valve        2.5 - 3.5    Recurrent Systemic Embolism              2.5 - 3.5          COLOR 12/29/2022 Light Yellow  Colorless, Straw, Yellow, Light Yellow Final    APPEARANCE 12/29/2022 Clear  Clear Final    SPECIFIC GRAVITY 12/29/2022 1.022  >1.005 - <1.030 Final    PH 12/29/2022 6.5  >5.0 - <8.0 Final    PROTEIN 12/29/2022 Not Detected  Not Detected mg/dL Final    GLUCOSE 98/06/9146 Not Detected  Not Detected mg/dL Final    KETONES 82/95/6213 Not Detected  Not Detected mg/dL Final    UROBILINOGEN 08/65/7846 Not Detected  Not Detected mg/dL Final    BILIRUBIN 96/29/5284 Not Detected  Not Detected mg/dL Final    BLOOD 13/24/4010 Not Detected  Not Detected mg/dL Final    NITRITE 27/25/3664 Not Detected  Not Detected Final    LEUKOCYTES 12/29/2022 Not Detected  Not Detected WBCs/uL Final    WBC 12/29/2022 5.4  3.7 - 11.0 x10^3/uL Final    RBC 12/29/2022 4.88  4.50 - 6.10 x10^6/uL Final    HGB 12/29/2022 15.3  13.4 - 17.5 g/dL Final    HCT 40/34/7425 45.4  38.9 - 52.0 % Final    MCV 12/29/2022 93.0  78.0 - 100.0 fL Final    MCH 12/29/2022 31.4  26.0 - 32.0 pg Final    MCHC 12/29/2022 33.7  31.0 - 35.5 g/dL Final    RDW-CV 95/63/8756 13.2  11.5 - 15.5 % Final    PLATELETS 12/29/2022 217  150 - 400 x10^3/uL Final    MPV 12/29/2022 9.6  8.7 - 12.5 fL Final    NEUTROPHIL % 12/29/2022 61.0  % Final    LYMPHOCYTE % 12/29/2022 25.0  % Final    MONOCYTE % 12/29/2022 11.0  % Final    EOSINOPHIL % 12/29/2022 3.0  % Final    BASOPHIL % 12/29/2022 0.0  % Final    NEUTROPHIL # 12/29/2022 3.24  1.50 - 7.70 x10^3/uL Final    LYMPHOCYTE # 12/29/2022 1.37  1.00 - 4.80 x10^3/uL Final    MONOCYTE # 12/29/2022 0.57  0.20 - 1.10 x10^3/uL Final    EOSINOPHIL #  12/29/2022 0.17  <=0.50 x10^3/uL Final    BASOPHIL # 12/29/2022 <0.10  <=0.20 x10^3/uL Final    IMMATURE GRANULOCYTE % 12/29/2022 0.0  0.0 - 1.0 % Final    The immature granulocyte fraction (IGF) quantifies total circulating myelocytes, metamyelocytes, and promyelocytes. It is used to evaluate immune responses to infection, inflammation, or other stimuli of the bone marrow. Caution is advised in interpreting test results in neonates who normally have greater numbers of circulating immature blood cells.      IMMATURE GRANULOCYTE # 12/29/2022 <0.10  <0.10 x10^3/uL Final    SODIUM 12/30/2022 137  136 - 145 mmol/L Final    POTASSIUM 12/30/2022 3.7  3.5 - 5.1 mmol/L Final    CHLORIDE 12/30/2022 107  96 - 111 mmol/L Final    CO2 TOTAL 12/30/2022 24  23 - 31 mmol/L Final    ANION GAP 12/30/2022 6  4 - 13 mmol/L Final    CALCIUM 12/30/2022 8.9  8.6 - 10.3 mg/dL Final    Gadolinium-containing contrast can interfere with calcium measurement.      GLUCOSE 12/30/2022 117  65 - 125 mg/dL Final  BUN 12/30/2022 20  8 - 25 mg/dL Final    CREATININE 16/05/9603 0.93  0.75 - 1.35 mg/dL Final    BUN/CREA RATIO 12/30/2022 22  6 - 22 Final    ESTIMATED GFR - MALE 12/30/2022 89  >=60 mL/min/BSA Final    Estimated Glomerular Filtration Rate (eGFR) is calculated using the gender-dependent CKD-EPI (2021) equation, intended for patients 57 years of age and older.    If patient sex is not documented, or if patient sex and gender are incongruent, eGFR results calculated for both male and male will be reported. Recommend correlation and careful interpretation of patient history, keeping in mind that serum creatinine is influenced by hormone therapy.    Stage, GFR, Classification   G1, 90, Normal or High   G2, 60-89, Mildly decreased   G3a, 45-59, Mildly to moderately decreased   G3b, 30-44, Moderately to severely decreased   G4, 15-29, Severely decreased   G5, <15, Kidney failure   In the absence of kidney damage, neither G1 or G2 fulfill  criteria for CKD per KDIGO.      MAGNESIUM  12/30/2022 1.9  1.8 - 2.6 mg/dL Final    PHOSPHORUS 54/04/8118 3.3  2.3 - 4.0 mg/dL Final    High-dose liposomal amphotericin B (AmBisome) therapy may falsely elevate phosphorus results.    WBC 12/30/2022 5.5  3.7 - 11.0 x10^3/uL Final    RBC 12/30/2022 4.62  4.50 - 6.10 x10^6/uL Final    HGB 12/30/2022 14.5  13.4 - 17.5 g/dL Final    HCT 14/78/2956 43.2  38.9 - 52.0 % Final    MCV 12/30/2022 93.5  78.0 - 100.0 fL Final    MCH 12/30/2022 31.4  26.0 - 32.0 pg Final    MCHC 12/30/2022 33.6  31.0 - 35.5 g/dL Final    RDW-CV 21/30/8657 13.2  11.5 - 15.5 % Final    PLATELETS 12/30/2022 190  150 - 400 x10^3/uL Final    MPV 12/30/2022 9.6  8.7 - 12.5 fL Final    NEUTROPHIL % 12/30/2022 59.0  % Final    LYMPHOCYTE % 12/30/2022 29.0  % Final    MONOCYTE % 12/30/2022 9.0  % Final    EOSINOPHIL % 12/30/2022 3.0  % Final    BASOPHIL % 12/30/2022 0.0  % Final    NEUTROPHIL # 12/30/2022 3.18  1.50 - 7.70 x10^3/uL Final    LYMPHOCYTE # 12/30/2022 1.61  1.00 - 4.80 x10^3/uL Final    MONOCYTE # 12/30/2022 0.48  0.20 - 1.10 x10^3/uL Final    EOSINOPHIL # 12/30/2022 0.18  <=0.50 x10^3/uL Final    BASOPHIL # 12/30/2022 <0.10  <=0.20 x10^3/uL Final    IMMATURE GRANULOCYTE % 12/30/2022 0.0  0.0 - 1.0 % Final    The immature granulocyte fraction (IGF) quantifies total circulating myelocytes, metamyelocytes, and promyelocytes. It is used to evaluate immune responses to infection, inflammation, or other stimuli of the bone marrow. Caution is advised in interpreting test results in neonates who normally have greater numbers of circulating immature blood cells.      IMMATURE GRANULOCYTE # 12/30/2022 <0.10  <0.10 x10^3/uL Final    SODIUM 12/31/2022 139  136 - 145 mmol/L Final    POTASSIUM 12/31/2022 4.2  3.5 - 5.1 mmol/L Final    CHLORIDE 12/31/2022 104  96 - 111 mmol/L Final    CO2 TOTAL 12/31/2022 29  23 - 31 mmol/L Final    ANION GAP 12/31/2022 6  4 - 13 mmol/L Final    CALCIUM  12/31/2022 8.9  8.6  - 10.3 mg/dL Final    Gadolinium-containing contrast can interfere with calcium measurement.      GLUCOSE 12/31/2022 85  65 - 125 mg/dL Final    BUN 65/78/4696 16  8 - 25 mg/dL Final    CREATININE 29/52/8413 1.09  0.75 - 1.35 mg/dL Final    BUN/CREA RATIO 12/31/2022 15  6 - 22 Final    ESTIMATED GFR - MALE 12/31/2022 73  >=60 mL/min/BSA Final    Estimated Glomerular Filtration Rate (eGFR) is calculated using the gender-dependent CKD-EPI (2021) equation, intended for patients 60 years of age and older.    If patient sex is not documented, or if patient sex and gender are incongruent, eGFR results calculated for both male and male will be reported. Recommend correlation and careful interpretation of patient history, keeping in mind that serum creatinine is influenced by hormone therapy.    Stage, GFR, Classification   G1, 90, Normal or High   G2, 60-89, Mildly decreased   G3a, 45-59, Mildly to moderately decreased   G3b, 30-44, Moderately to severely decreased   G4, 15-29, Severely decreased   G5, <15, Kidney failure   In the absence of kidney damage, neither G1 or G2 fulfill criteria for CKD per KDIGO.      MAGNESIUM  12/31/2022 1.9  1.8 - 2.6 mg/dL Final    WBC 24/40/1027 5.5  3.7 - 11.0 x10^3/uL Final    RBC 12/31/2022 4.67  4.50 - 6.10 x10^6/uL Final    HGB 12/31/2022 14.3  13.4 - 17.5 g/dL Final    HCT 25/36/6440 44.3  38.9 - 52.0 % Final    MCV 12/31/2022 94.9  78.0 - 100.0 fL Final    MCH 12/31/2022 30.6  26.0 - 32.0 pg Final    MCHC 12/31/2022 32.3  31.0 - 35.5 g/dL Final    RDW-CV 34/74/2595 13.2  11.5 - 15.5 % Final    PLATELETS 12/31/2022 183  150 - 400 x10^3/uL Final    MPV 12/31/2022 9.7  8.7 - 12.5 fL Final    NEUTROPHIL % 12/31/2022 56.0  % Final    LYMPHOCYTE % 12/31/2022 31.0  % Final    MONOCYTE % 12/31/2022 10.0  % Final    EOSINOPHIL % 12/31/2022 3.0  % Final    BASOPHIL % 12/31/2022 0.0  % Final    NEUTROPHIL # 12/31/2022 3.05  1.50 - 7.70 x10^3/uL Final    LYMPHOCYTE # 12/31/2022 1.69  1.00 -  4.80 x10^3/uL Final    MONOCYTE # 12/31/2022 0.55  0.20 - 1.10 x10^3/uL Final    EOSINOPHIL # 12/31/2022 0.19  <=0.50 x10^3/uL Final    BASOPHIL # 12/31/2022 <0.10  <=0.20 x10^3/uL Final    IMMATURE GRANULOCYTE % 12/31/2022 0.0  0.0 - 1.0 % Final    The immature granulocyte fraction (IGF) quantifies total circulating myelocytes, metamyelocytes, and promyelocytes. It is used to evaluate immune responses to infection, inflammation, or other stimuli of the bone marrow. Caution is advised in interpreting test results in neonates who normally have greater numbers of circulating immature blood cells.      IMMATURE GRANULOCYTE # 12/31/2022 <0.10  <0.10 x10^3/uL Final       Imaging Studies:    NUC GASTRIC EMPTYING STUDY  Result Date: 11/23/2023  Male, 68 years old. NUC GASTRIC EMPTYING STUDY performed on 11/23/2023 12:45 PM. REASON FOR EXAM:  R10.9: Abdominal pain, unspecified abdominal location Findings: A nuclear medicine gastric emptying exam was performed following oral ingestion of  a solid meal (per protocol) containing 3.7 mCi of technetium 74m sulfur  colloid. Imaging was performed over the anterior abdomen out to 3 hours postingestion of the medial. There is no evidence of gastric outlet obstruction as the radiotracer is seen to essentially be completely cleared of the gastric lumen within 3 hours. The time/activity curve shows a slightly delayed emptying half time of 97 minutes. Normal emptying half-time is 95 minutes or less.     Slightly delayed gastric emptying as noted above. No gastric outlet obstruction detected. Radiologist location ID: [id number]       Assessment:   Sean Wall is a 68 y.o.male patient with significant PMH listed above here for GERD and abdominal pain.     Recommendations:    The patient was explained the potential risks of the endoscopic procedure(s) including but not limited to bleeding, perforation, mucosal tears, spleen laceration, anesthesia related complications including death and  has agreed to undergo these tests.    Deadra Everts, APRN  Gastroenterology    Agree with assessment and recommendations above.  Melynda Stagger, MD  Gastroenterology

## 2023-12-04 DIAGNOSIS — K2950 Unspecified chronic gastritis without bleeding: Secondary | ICD-10-CM

## 2023-12-04 LAB — SURGICAL PATHOLOGY SPECIMEN

## 2023-12-11 ENCOUNTER — Ambulatory Visit (HOSPITAL_BASED_OUTPATIENT_CLINIC_OR_DEPARTMENT_OTHER): Payer: Self-pay | Admitting: Gastroenterology

## 2024-02-06 ENCOUNTER — Other Ambulatory Visit: Payer: Self-pay

## 2024-02-06 ENCOUNTER — Ambulatory Visit: Payer: Self-pay | Attending: Gastroenterology | Admitting: Gastroenterology

## 2024-02-06 ENCOUNTER — Encounter (HOSPITAL_BASED_OUTPATIENT_CLINIC_OR_DEPARTMENT_OTHER): Payer: Self-pay | Admitting: Gastroenterology

## 2024-02-06 VITALS — BP 110/70 | Ht 70.0 in | Wt 190.9 lb

## 2024-02-06 DIAGNOSIS — K580 Irritable bowel syndrome with diarrhea: Secondary | ICD-10-CM | POA: Insufficient documentation

## 2024-02-06 NOTE — Progress Notes (Cosign Needed)
 [facility]  Patient ID: Sean Wall is a 68 y.o. male.   PCP: Woodie Das, MD   Chief complaint: abdominal pain    History of Presenting Illness  This is a 68 y.o. male, with a PMH of GERD, IBS, HTN, migraine, depression who presents here today for abdominal pain. Pt states that he is still experiencing abdominal pain. He has been trying to lose weight. 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 Occasionally he takes Senna. He will occasionally drink a protein shake with Miralax  and Metamucil. Patient expresses concern of occasional constipation. Patient denies any unintentional weight loss, rectal bleeding, and there are no other complaints at this time.   From prior notes:  This is a 68 year old man past medical history of GERD, IBS, hypertension, migraine, depression who presents here today for abdominal pain. He reports experiencing abdominal pain every 2 days. The pain is mostly present after eating. He takes Hydrocodone  for pain. Patient will occasionally have nausea. He does report having a smaller appetite when compared to previously. Patient takes Omeprazole at night. He is unsure if this changes his sxs. He had a UTI about a month ago and was on 2 different abx. Patient states that the pain started prior to that. Patient denies any unintentional weight loss, and there are no other complaints at this time.    From prior notes:   This is a 68 year old man past medical history of GERD, IBS, hypertension, migraine, depression who presents here today for follow-up. 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  Pathology was consistent with ischemic colitis.  Since his discharge he has been feeling well with no new complaints.  Patient has a baseline of abdominal discomfort and diarrhea which is chronic and has been going on for many years which he attributes to his IBS. Bowel  habits are mainly diarrhea. Had a formed bowel motion last night the first time. He had an image on his phone which shows thin caliber formed stool.     Family History of Colon Polyps/Colon Cancer/GI Malignancies: None    Endoscopy History:   Colonoscopy 04/08/21     Non bleeding small internal hemorrhoids  Mucosal changes suggestive of ischemic colitis of the transverse colon and descending colon.  Normal terminal ileum mucosa  Boston Bowel Prep Score (BBPS) 7     Multiple EGDs in the past (7)    Review of Systems   Constitutional:  Negative for activity change, chills and diaphoresis.   HENT:  Negative for congestion.    Eyes:  Negative for discharge.   Respiratory:  Negative for apnea, cough, choking, chest tightness, shortness of breath, wheezing and stridor.    Cardiovascular:  Negative for chest pain, palpitations and leg swelling.   Gastrointestinal:  Positive for abdominal pain. Negative for abdominal distention, anal bleeding, blood in stool, nausea, rectal pain and vomiting.   Endocrine: Negative for cold intolerance.   Genitourinary:  Negative for difficulty urinating.   Musculoskeletal:  Negative for arthralgias.   Skin:  Negative for color change and pallor.   Allergic/Immunologic: Negative for immunocompromised state.   Neurological:  Negative for dizziness.   Psychiatric/Behavioral:  Negative for behavioral problems and confusion.       All 14 ROS has been reviewed and negative except for what has been stated in the HPI.     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 Unice. Right 2018 Dr Unice JOY CYST REMOVAL Right 1990    right wrist and right ankle. [employer]    HX LUMBAR FUSION  2018    hardware in place--Dr. Unice JOY NOSE/SINUS SURGERY  2005    deviated septum repair. Dr. Mable JOY SHOULDER SURGERY Bilateral     5 surgeries on right and 3 on the left    HX TURP      HX VASECTOMY      Dr. Duncan FONTANA HERNIA REPAIR Right 1991    [employer]    LASIK Bilateral 2004    ULNAR TUNNEL RELEASE Left           Family Medical History:       Problem Relation (Age of Onset)    Diabetes Mother    No Known Problems Father, Sister, Brother, Half-Sister, Half-Brother, Maternal Aunt, Maternal Uncle, Paternal Aunt, Paternal Uncle, Maternal Grandmother, Maternal Grandfather, Paternal Grandmother, Paternal Grandfather, Daughter, Son, Other             Social History     Socioeconomic History    Marital status: Married   Tobacco Use    Smoking status: Light Smoker     Types: Cigars    Smokeless tobacco: Never    Tobacco comments:     OCCASIONAL SMOKING-1 CIGAR EVERY 2-4 WEEKS   Vaping Use    Vaping status: Never Used   Substance and Sexual Activity    Alcohol use: Yes     Comment: Occasional    Drug use: Never   Other Topics Concern    Ability to Walk 1 Flight of Steps without SOB/CP Yes    Ability to Walk 2 Flight of Steps without SOB/CP Yes    Ability To Do Own ADL's Yes     Social Determinants of Health     Social Connections: Low Risk  (12/29/2022)    Social Connections     SDOH Social Isolation: 5 or more times a week    Received from [facility]    HITS       Allergies   Allergen Reactions    Propofol Rash and  Other Adverse Reaction (Add comment)     High temperature for about 2 weeks (103-104 degree temp); Rash  Lithium  Other Adverse Reaction (Add comment)     Blurred vision        Current Outpatient Medications:     atorvastatin  (LIPITOR) 40 mg Oral Tablet, Take 1 Tablet (40 mg total) by mouth Every evening, Disp: , Rfl:     azelastine-fluticasone 137-50 mcg/spray Nasal Spray, Non-Aerosol, Administer 2 Sprays into affected nostril(s) Every night as needed Only uses if having a issue, Disp: , Rfl:     buPROPion  (WELLBUTRIN  XL) 300 mg extended release 24 hr tablet, Take 1 Tablet (300 mg total) by mouth Daily, Disp: , Rfl:     cetirizine (ZYRTEC) 10 mg Oral Tablet, Take 1 Tablet (10 mg total) by mouth Every night as needed for Allergies, Disp: , Rfl:     cyanocobalamin  (VITAMIN B12) 1,000 mcg/mL Injection Solution, 1 mL (1,000 mcg total) Every 30 days Takes on the 23rd, Disp: , Rfl:     cyclobenzaprine  (FLEXERIL ) 10 mg Oral Tablet, Take 1 Tablet (10 mg total) by mouth Three times a day as needed Usually take at one efery  HS, Disp: , Rfl:     dextroamphetamine-amphetamine (ADDERALL) 10 mg Oral Tablet, Take 1 Tablet (10 mg total) by mouth Once per day as needed for Other, Disp: , Rfl:     finasteride  (PROSCAR ) 5 mg Oral Tablet, Take 1 Tablet (5 mg total) by mouth Every evening, Disp: , Rfl:     HYDROcodone -acetaminophen  (NORCO) 5-325 mg Oral Tablet, Take 1 Tablet by mouth Every 8 hours as needed for Pain, Disp: , Rfl:     Ibuprofen (MOTRIN) 600 mg Oral Tablet, Take 1 Tablet (600 mg total) by mouth Four times a day as needed for Pain, Disp: , Rfl:     melatonin 3 mg Oral Capsule, Take 2 Capsules (6 mg total) by mouth Every night, Disp: , Rfl:     metoprolol  succinate (TOPROL -XL) 25 mg Oral Tablet Sustained Release 24 hr, Take 0.5 Tablets (12.5 mg total) by mouth Every evening, Disp: , Rfl:     pantoprazole  (PROTONIX ) 20 mg Oral Tablet, Delayed Release (E.C.), Take 1  Tablet (20 mg total) by mouth Every evening, Disp: , Rfl:     QUEtiapine  (SEROQUEL ) 50 mg Oral Tablet, Take 1 Tablet (50 mg total) by mouth Every night, Disp: , Rfl:     sumatriptan  succinate (IMITREX ) 100 mg Oral Tablet, Take 1 Tablet (100 mg total) by mouth Once, as needed for Migraine May repeat in 2 hours in needed, Disp: , Rfl:     tadalafil  (CIALIS ) 20 mg Oral Tablet, Take 1 Tablet (20 mg total) by mouth Every 24 hours as needed, Disp: 30 Tablet, Rfl: 4    Testosterone 1 % (25 mg/2.5gram) Transdermal Gel in Packet, Place 2 Each on the skin Once a day 2 pumps each shoulder daily am, Disp: , Rfl:      Objective:   Vitals:    02/06/24 1453   BP: 110/70   Weight: 86.6 kg (190 lb 14.7 oz)   Height: 1.778 m (5' 10)   BMI: 27.39          Body mass index is 27.39 kg/m.   Physical Exam  Vitals and nursing note reviewed.   Constitutional:       Appearance: Normal appearance.   HENT:      Head: Normocephalic and atraumatic.      Nose: Nose normal.      Mouth/Throat:      Mouth: Mucous membranes are moist.   Eyes:  Extraocular Movements: Extraocular movements intact.      Pupils: Pupils are equal, round, and reactive to light.   Cardiovascular:      Rate and Rhythm: Normal rate and regular rhythm.   Pulmonary:      Effort: Pulmonary effort is normal. No respiratory distress.   Abdominal:      General: Abdomen is flat.      Palpations: Abdomen is soft.   Musculoskeletal:         General: Normal range of motion.      Cervical back: Normal range of motion.   Skin:     General: Skin is warm.   Neurological:      General: No focal deficit present.      Mental Status: He is alert and oriented to person, place, and time. Mental status is at baseline.   Psychiatric:         Mood and Affect: Mood normal.         Behavior: Behavior normal.          Labs/Radiology   No results were found from the past 30 days.  No results found for this or any previous visit (from the past 6 weeks).    Assessment:    This is a 68 y.o. male,  with a PMH of GERD, IBS, HTN, migraine, depression who presents here today for abdominal pain. Pt states that he is still experiencing abdominal pain. He has been trying to lose weight. 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 Occasionally he takes Senna. He will occasionally drink a protein shake with Miralax  and Metamucil. Patient expresses concern of occasional constipation. Patient denies any unintentional weight loss, rectal bleeding, and there are no other complaints at this time.     Antithrombotics/Anticoagulants:  None  Relevant Surgical History:  None  Cardio-Pulm History/Clearance:  None    Plan:     1. Ischemic colitis  Resolved  Feeling well     2. IBS - Mixed type  Low FODMAP diet  Imodium as needed  Fiber supplement     3. CRC Screening:  Colonoscopy 03/2021 - no polyps  Ischemic colitis  Patient is in agreement with colonoscopy   Colonoscopy prep with golytely   Instructions explained and hard copy given to the patient who verbalized understanding  Clearances as noted  Withold anticoagulants and anti-platelets as needed       4. HCV Screening:  USPTF recommends one time screening for HCV in adults age 83-79  To be done by PCP    5. Abdominal pain  Trial of Bentyl   Will schedule EGD    Follow up 1 year    No diagnosis found.    I am scribing for, and in the presence of, Dr. Sherrod Lie for services provided on 02/06/2024.  Miachel Nardelli, SCRIBE   Jeraldine Primeau, SCRIBE

## 2024-03-06 ENCOUNTER — Other Ambulatory Visit: Payer: Self-pay

## 2024-03-06 ENCOUNTER — Ambulatory Visit: Payer: Self-pay | Attending: Urology | Admitting: Urology

## 2024-03-06 ENCOUNTER — Encounter (INDEPENDENT_AMBULATORY_CARE_PROVIDER_SITE_OTHER): Payer: Self-pay | Admitting: Urology

## 2024-03-06 VITALS — BP 120/80 | HR 87 | Temp 97.3°F | Ht 70.0 in | Wt 184.7 lb

## 2024-03-06 DIAGNOSIS — N529 Male erectile dysfunction, unspecified: Secondary | ICD-10-CM | POA: Insufficient documentation

## 2024-03-06 DIAGNOSIS — N5319 Other ejaculatory dysfunction: Secondary | ICD-10-CM

## 2024-03-06 DIAGNOSIS — N400 Enlarged prostate without lower urinary tract symptoms: Secondary | ICD-10-CM | POA: Insufficient documentation

## 2024-03-06 DIAGNOSIS — N138 Other obstructive and reflux uropathy: Secondary | ICD-10-CM

## 2024-03-06 DIAGNOSIS — R3915 Urgency of urination: Secondary | ICD-10-CM

## 2024-03-06 DIAGNOSIS — N401 Enlarged prostate with lower urinary tract symptoms: Secondary | ICD-10-CM

## 2024-03-06 DIAGNOSIS — N5314 Retrograde ejaculation: Secondary | ICD-10-CM

## 2024-03-06 LAB — POC URINALYSIS (RESULTS)
BILIRUBIN: NEGATIVE mg/dL
BLOOD: NEGATIVE mg/dL
GLUCOSE: NEGATIVE mg/dL
KETONES: NEGATIVE mg/dL
LEUKOCYTES: NEGATIVE WBCs/uL
NITRITE: NEGATIVE
PH: 6 (ref 5.0–8.0)
PROTEIN: NEGATIVE mg/dL
SPECIFIC GRAVITY: 1.01 (ref 1.005–1.030)
UROBILINOGEN: 0.2 mg/dL

## 2024-03-06 MED ORDER — TADALAFIL 20 MG TABLET
20.0000 mg | ORAL_TABLET | ORAL | 4 refills | Status: DC | PRN
Start: 2024-03-06 — End: 2024-05-03

## 2024-03-06 NOTE — Progress Notes (Signed)
 NAME :Sean Wall  AGE: 68 y.o.  DATE: 03/06/2024  SERVICE: Urology    Chief Complaint   Patient presents with    Other     HX ED . BPH        HPI  03/06/2024: Patient is no longer taking Flomax , He is taking finasteride  monotherapy and is doing well on this. His AUA SS is 5 with QoL of 1. He takes transdermal Testosterone prescribes by Dr. Gwenn. He states the dosing is hit or miss. He requests we refill the Cialis  because results were adequate.    03/01/2023:  Patient returns to clinic today to discuss climbing sure and ED.  Patient states that he is using Cialis  with adequate results.  Reports he had a hard time breaking these tablets so he has been taking the 20 mg dose as needed however he reports that he has not been sexually active frequently due to back pain.  Patient reports that his climacturia is not bothersome and that he usually pees prior to sex.    (03/10/22):  Patient is now 6 weeks status post cystoscopy cystometrogram.  At the time he was found to have normal capacity bladder with postvoid residual of 0 cc.  He was noted to have an enlarged median lobe however prostatic urethra was open status post transurethral resection.  He continues to have urinary urgency. He is still experiencing climacturia with retrograde ejaculation although this improves when he empties his bladder prior to sexual activity.      (12/08/21):  This is a 68 y.o. male here for evaluation of ejaculatory dysfunction.  He gives a history of TURP in 2005 or 2006.  He states that this was a formal resection but he continued to have normal ejaculation.  He then began having some difficulties with ejaculation around 2016.  He now has rare ejaculations or orgasm.  He does describe some recent leakage of urine with climax.  He describes good quality erections.  He does relate that he was started on finasteride  and tamsulosin  about 2 years ago primarily for nocturia.  He has stopped Flomax  and anejaculation persists  He states he also  suffers from urinary urgency, sometimes leaking urine prior to making it to the bathroom on time.  He denies hematuria or dysuria.            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 Unice. Right 2018 Dr Unice JOY CYST REMOVAL Right 1990    right wrist and right ankle. [employer]    HX LUMBAR FUSION  2018    hardware in place--Dr. Unice JOY NOSE/SINUS SURGERY  2005    deviated septum repair. Dr. Mable JOY SHOULDER SURGERY Bilateral     5 surgeries on right and 3 on the left    HX TURP      HX VASECTOMY      Dr. Duncan FONTANA HERNIA REPAIR Right 1991    [employer]    LASIK Bilateral 2004    ULNAR TUNNEL RELEASE Left              Allergies  Allergies   Allergen Reactions    Propofol Rash and  Other Adverse Reaction (Add comment)     High temperature for about 2 weeks (103-104 degree temp); Rash    Lithium  Other Adverse Reaction (Add comment)     Blurred vision         Medications  atorvastatin  (LIPITOR) 40 mg Oral Tablet, Take 1 Tablet (40 mg total) by mouth Every evening  azelastine-fluticasone 137-50  mcg/spray Nasal Spray, Non-Aerosol, Administer 2 Sprays into affected nostril(s) Every night as needed Only uses if having a issue  buPROPion  (WELLBUTRIN  XL) 300 mg extended release 24 hr tablet, Take 1 Tablet (300 mg total) by mouth Daily  cetirizine (ZYRTEC) 10 mg Oral Tablet, Take 1 Tablet (10 mg total) by mouth Every night as needed for Allergies  cyanocobalamin  (VITAMIN B12) 1,000 mcg/mL Injection Solution, 1 mL (1,000 mcg total) Every 30 days Takes on the 23rd  cyclobenzaprine  (FLEXERIL ) 10 mg Oral Tablet, Take 1 Tablet (10 mg total) by mouth Three times a day as needed Usually take at one efery  HS  dextroamphetamine-amphetamine (ADDERALL) 10 mg Oral Tablet, Take 1 Tablet (10 mg total) by mouth Once per day as needed for Other  finasteride  (PROSCAR ) 5 mg Oral Tablet, Take 1 Tablet (5 mg total) by mouth Every evening  HYDROcodone -acetaminophen  (NORCO) 5-325 mg Oral Tablet, Take 1 Tablet by mouth Every 8 hours as needed for Pain  Ibuprofen (MOTRIN) 600 mg Oral Tablet, Take 1 Tablet (600 mg total) by mouth Four times a day as needed for Pain  melatonin 3 mg Oral Capsule, Take 2 Capsules (6 mg total) by mouth Every night  metoprolol  succinate (TOPROL -XL) 25 mg Oral Tablet Sustained Release 24 hr, Take 0.5 Tablets (12.5 mg total) by mouth Every evening  pantoprazole  (PROTONIX ) 20 mg Oral Tablet, Delayed Release (E.C.), Take 1 Tablet (20 mg total) by mouth Every evening  QUEtiapine  (SEROQUEL ) 50 mg Oral Tablet, Take 1 Tablet (50 mg total) by mouth Every night  sumatriptan  succinate (IMITREX ) 100 mg Oral Tablet, Take 1 Tablet (100 mg total) by mouth Once, as needed for Migraine May repeat in 2 hours in needed  tadalafil  (CIALIS ) 20 mg Oral Tablet, Take 1 Tablet (20 mg total) by mouth Every 24 hours as needed (Patient not taking: Reported on 03/06/2024)  Testosterone 1 % (25 mg/2.5gram) Transdermal Gel in Packet, Place 2 Each on the skin Once a day 2 pumps each shoulder daily am    No facility-administered medications  prior to visit.      Past Social History  He is a Cytogeneticist.  He smokes cigars.  No illicit drug use.    Family History   Neg for GU malignancy      ROS:  Please see HPI.  All other systems were reviewed by me and are negative.                OBJECTIVE:  PHYSICAL EXAM:    BP 120/80   Pulse 87   Temp 36.3 C (97.3 F) (Thermal Scan)   Ht 1.778 m (5' 10)   Wt 83.8 kg (184 lb 11.9 oz)   SpO2 97% Comment: ra  BMI 26.51 kg/m       General:  NAD.  Vital signs reviewed.        LABS:  He reports that his PSAs are [facility].  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 Retrograde ejaculation with climacturia  3. Erectile dysfunction, mild  SHIM 17  4. Lower urinary tract symptoms (moderate) with urinary urgency  AUA symptom score 12    PLAN:    -We would a long discussion regarding treatment options for climacturia.  At this point, in the absence of severe erectile dysfunction and/or severe stress urinary incontinence, surgical treatment options involving sling, mini jupette or artificial urethral sphincter would be far too aggressive. He isn't interested in any invasive intervention at this time.  -Patient is to continue emptying his bladder prior to any sexual activity  Continue finasteride   Refill Cialis  20 mg PRN.  Testosterone managed by PCP, discussed this is OK and he will let us  know if he wants us  to manage.   -RTC in one year    Norleen IVAR Punches, MD 03/06/2024, 10:48  Genitourinary Reconstructive Surgery  Assistant Professor - Department of Urology

## 2024-04-30 ENCOUNTER — Other Ambulatory Visit (HOSPITAL_COMMUNITY): Payer: Self-pay | Admitting: FAMILY PRACTICE

## 2024-04-30 DIAGNOSIS — M5416 Radiculopathy, lumbar region: Secondary | ICD-10-CM

## 2024-05-02 ENCOUNTER — Encounter (INDEPENDENT_AMBULATORY_CARE_PROVIDER_SITE_OTHER): Payer: Self-pay | Admitting: Urology

## 2024-05-02 DIAGNOSIS — N521 Erectile dysfunction due to diseases classified elsewhere: Secondary | ICD-10-CM

## 2024-05-02 DIAGNOSIS — N401 Enlarged prostate with lower urinary tract symptoms: Secondary | ICD-10-CM

## 2024-05-03 ENCOUNTER — Other Ambulatory Visit: Payer: Self-pay

## 2024-05-03 ENCOUNTER — Ambulatory Visit
Admission: RE | Admit: 2024-05-03 | Discharge: 2024-05-03 | Disposition: A | Discharge status: Home / Self Care | Source: Ambulatory Visit | Attending: FAMILY PRACTICE

## 2024-05-03 DIAGNOSIS — M5416 Radiculopathy, lumbar region: Secondary | ICD-10-CM | POA: Insufficient documentation

## 2024-05-03 DIAGNOSIS — M7138 Other bursal cyst, other site: Secondary | ICD-10-CM

## 2024-05-03 DIAGNOSIS — M48061 Spinal stenosis, lumbar region without neurogenic claudication: Secondary | ICD-10-CM

## 2024-05-03 MED ORDER — TADALAFIL 5 MG TABLET
5.0000 mg | ORAL_TABLET | ORAL | 3 refills | Status: DC | PRN
Start: 2024-05-03 — End: 2024-05-23

## 2024-05-03 NOTE — Telephone Encounter (Signed)
 Called patient to address concerns.  Patient states he would like to try Cialis  5 mg for daily use.  Patient counseled extensively regarding risks and benefits of daily use Cialis  specifically instructing him not to take with nitroglycerin as this will cause fatal irreversible hypotension that can result in death.  Patient verbalized understanding.    Plan to return to care in 1 year as previously planned by Dr. Delories.    Herlene Speaks, PA-C

## 2024-05-15 ENCOUNTER — Ambulatory Visit (HOSPITAL_COMMUNITY)

## 2024-05-23 ENCOUNTER — Ambulatory Visit (INDEPENDENT_AMBULATORY_CARE_PROVIDER_SITE_OTHER): Payer: Self-pay | Admitting: Physician Assistant

## 2024-05-23 ENCOUNTER — Telehealth (INDEPENDENT_AMBULATORY_CARE_PROVIDER_SITE_OTHER): Payer: Self-pay | Admitting: Physician Assistant

## 2024-05-23 DIAGNOSIS — N138 Other obstructive and reflux uropathy: Secondary | ICD-10-CM

## 2024-05-23 DIAGNOSIS — N521 Erectile dysfunction due to diseases classified elsewhere: Secondary | ICD-10-CM

## 2024-05-23 MED ORDER — TADALAFIL 5 MG TABLET
5.0000 mg | ORAL_TABLET | ORAL | 3 refills | Status: AC | PRN
Start: 2024-05-23 — End: 2025-05-18

## 2024-05-23 NOTE — Nursing Note (Signed)
 Message from Adrien HERO sent at 05/23/2024 12:23 PM EDT    Summary: Clinical Request    Copied From CRM [id number].  Unrein, Alm HERO (Self) called with a new medication request.  Dice [facility] to try sildenafil 5mg  before he can go on tadalafil . Please call to discuss or resend new script.    [facility] - [place], [place] - [address]  [address] [place] [address]  Phone: [phone] Fax: [phone]  Hours: Not open 24 hours     Please call if sent                Call History    Contact Date/Time Type Contact Identity Validated Phone/Fax By   05/23/2024 12:23 PM EDT Phone (Incoming) Erb, Desiderio Dolata (Self)  [phone] BENNIE) Silva Adrien   05/23/2024 12:19 PM EDT Phone (Incoming) Barsamian, Alm HERO (Self) Name, DOB, Phone, Address [phone] [place]) [place], Adrien Primes Dice PA-C spoke with the patient.    Olam Bohr, RN

## 2024-05-23 NOTE — Telephone Encounter (Signed)
 Called patient, discuss that the [facility] denied his tadalafil  5 mg tablet as there is insufficient evidence regarding the usage of tadalafil  5 mg alternating with tadalafil  20 mg.  Patient is agreeable to have prescription sent to local [facility] pharmacy utilizing [facility] for total price of about 25 dollars.  Patient will contact [facility] as well as Medicare and inquire about pricing.  All questions and concerns addressed.  Return to care as planned.    Herlene Speaks, PA-C

## 2024-06-25 ENCOUNTER — Other Ambulatory Visit: Payer: Self-pay

## 2024-06-25 ENCOUNTER — Encounter (HOSPITAL_BASED_OUTPATIENT_CLINIC_OR_DEPARTMENT_OTHER): Payer: Self-pay | Admitting: Physician Assistant

## 2024-06-25 ENCOUNTER — Ambulatory Visit: Payer: Self-pay | Attending: Physician Assistant | Admitting: Physician Assistant

## 2024-06-25 VITALS — BP 122/70 | HR 56 | Ht 70.0 in | Wt 185.1 lb

## 2024-06-25 DIAGNOSIS — G894 Chronic pain syndrome: Secondary | ICD-10-CM | POA: Insufficient documentation

## 2024-06-25 DIAGNOSIS — Z981 Arthrodesis status: Secondary | ICD-10-CM | POA: Insufficient documentation

## 2024-06-25 DIAGNOSIS — M5416 Radiculopathy, lumbar region: Secondary | ICD-10-CM | POA: Insufficient documentation

## 2024-06-25 NOTE — Nursing Note (Signed)
 06/25/24 1056   [facility]   Location BACK  ([facility], Lumbar rediculopathy. Lumbar fusion 2016. MRI 05/03/24, LESI 07/14/23)   How did pain start chronic   Pain: (from 0 - 10) 8   Worst pain rating  10   Number of worst pain incidents in last week More than 8   Words to describe Aching;Sharp   Timing of pain Constant   What exacerbates pain Lifting;Stair Climbing;Standing;Sitting;Walking   Past Pain Medication NSAIDs;Muscle Relaxers;Prescription Pain Medications   Previous Surgeries lumbar fusioon 2016   Activities limited by pain everythinh

## 2024-06-25 NOTE — H&P (Signed)
 [facility], [facility]  [address]  [place] [address]  Operated by [facility]     Name: Sean Wall MRN:  [id number]   Date: 06/25/2024 Age: 68 y.o.        History and Physical    New Patient    Reason for Visit: Low Back Pain (NPV, Lumbar radiculopathy, no recent imaging,, [facility] referral/Patient is aware no medication management )    History of Present Illness  Sean Wall is a 68 y.o. male who is being seen today for evaluation as a new patient for low back pain. Patient was referred by the [facility].     Initial HPI 06/25/2024 -- Patient was referred for his low back pain but prioritizes his neck pain. He also endorses bilateral shoulder pain, right hip/groin/inguinal pain for which he would like to pursue treatment. Patient indicates treatment in the past with hydrocodone  and reports relief with this.     He reports bilateral low back pain which is his secondary concern. This is located right > left of midline off into the paraspinal region. He has pain that extends from the low back into the buttock and thighs, generally terminating about the knee. He first presented with severe sciatica and underwent lumbar spine fusion. He has undergone TFESI on the left on three occasions covering L4-5 and L5-S1 which covered his pain and this is what he is most interested in pursuing in regards to his lumbosacral symptoms.     He reports right shoulder pain chronically and was offered shoulder replacement but he is putting that off as much as possible. He was undergoing bilateral shoulder injections (with right knee) same day at his previous pain clinic. He has a history of 3 surgeries on his left side.     His chief concern is bilateral neck pain. Present up the neck through the occiput. Generally located on the left but when most severe he has pain on the right and it seems to extending into the shoulder and arm.     Relevant ROS negative except as noted in  HPI      Location: Back (NPV-[facility] referral, Lumbar rediculopathy. Lumbar fusion 2016. MRI 05/03/24, LESI 07/14/23)  How did pain start: chronic  Worst pain rating : 10  Number of worst pain incidents in last week: More than 8  Words to describe: Aching, Sharp  Timing of pain: Constant  What exacerbates pain: Lifting, Stair Climbing, Standing, Sitting, Walking  Past Pain Medication: NSAIDs, Muscle Relaxers, Prescription Pain Medications  Previous Surgeries: lumbar fusioon 2016  Activities limited by pain: everythinh          Past Medical History:   Diagnosis Date    Acid reflux     CPAP (continuous positive airway pressure) dependence     Enlarged prostate     Epigastric pain 12/29/2022    Erectile dysfunction     H/O hearing loss     Headache     Health education/counseling 12/30/2022    Hemorrhoids     History of anesthesia complications     Propofol allergy - Rash; high temperature for about 2 weeks; 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 Unice. Right 2018 Dr Unice JOY CYST REMOVAL Right 1990    right wrist and right ankle. [employer]    HX LUMBAR FUSION  2018    hardware in place--Dr. Unice JOY NOSE/SINUS SURGERY  2005    deviated septum repair. Dr. Mable JOY SHOULDER SURGERY Bilateral     5 surgeries on right and 3 on the left    HX TURP      HX VASECTOMY      Dr. Duncan FONTANA HERNIA REPAIR Right 1991    [employer]    LASIK Bilateral 2004    ULNAR TUNNEL RELEASE Left          Current Outpatient Medications   Medication Sig     atorvastatin  (LIPITOR) 40 mg Oral Tablet Take 1 Tablet (40 mg total) by mouth Every evening    azelastine-fluticasone 137-50 mcg/spray Nasal Spray, Non-Aerosol Administer 2 Sprays into affected nostril(s) Every night as needed Only uses if having a issue    buPROPion  (WELLBUTRIN  XL) 300 mg extended release 24 hr tablet Take 1 Tablet (300 mg total) by mouth Daily    cetirizine (ZYRTEC) 10 mg Oral Tablet Take 1 Tablet (10 mg total) by mouth Every night as needed for Allergies    cyanocobalamin  (VITAMIN B12) 1,000 mcg/mL Injection Solution 1 mL (1,000 mcg total) Every 30 days Takes on the 23rd    cyclobenzaprine  (FLEXERIL ) 10 mg Oral Tablet Take 1 Tablet (10 mg total) by mouth Three times a day as needed Usually take at one efery  HS (Patient not taking: Reported on 06/25/2024)    dextroamphetamine-amphetamine (ADDERALL) 10 mg Oral Tablet Take 1 Tablet (10 mg total) by mouth Once per day as needed for Other    finasteride  (PROSCAR ) 5 mg Oral Tablet Take 1 Tablet (5 mg total) by mouth Every evening    HYDROcodone -acetaminophen  (NORCO) 5-325 mg Oral Tablet Take 1 Tablet by mouth Every 8 hours as needed for Pain    Ibuprofen (MOTRIN) 600 mg Oral Tablet Take 1 Tablet (600 mg total) by mouth Four times a day as needed for Pain    melatonin 3 mg Oral Capsule Take 2 Capsules (6 mg total) by mouth Every night    metoprolol  succinate (TOPROL -XL) 25 mg Oral Tablet Sustained Release 24 hr Take 0.5 Tablets (12.5 mg total) by mouth Every evening    pantoprazole  (PROTONIX ) 20 mg Oral Tablet, Delayed Release (E.C.) Take 1 Tablet (20 mg total) by mouth Every evening    QUEtiapine  (SEROQUEL ) 50 mg Oral Tablet Take 1 Tablet (50 mg total) by mouth Every night    sumatriptan  succinate (IMITREX ) 100 mg Oral Tablet Take 1 Tablet (100 mg total) by mouth Once, as needed for Migraine May repeat in 2 hours in needed    tadalafil  (CIALIS ) 5 mg Oral Tablet Take 1 Tablet (5 mg total) by mouth Every 24 hours as needed for up to 360 days  Indications: enlarged prostate with urination problem, the inability to have an erection    Testosterone 1 % (25 mg/2.5gram) Transdermal  Gel in Packet Place 2 Each on the skin Once a day 2 pumps each shoulder daily am     Allergies[1]  Family Medical History:       Problem Relation (Age of Onset)    Diabetes Mother    No Known Problems Father, Sister, Brother, Half-Sister, Half-Brother, Maternal Aunt, Maternal Uncle, Paternal Aunt, Paternal Uncle, Maternal Grandmother, Maternal Grandfather, Paternal Grandmother, Paternal Grandfather, Daughter, Son, Other            Social History[2]    Nurse Notes:    Nursing Notes:   Durward Dragon, RN  06/25/24 1107  Signed     06/25/24 1056   [facility]   Location BACK  (NPV-[facility] referral, Lumbar rediculopathy. Lumbar fusion 2016. MRI 05/03/24, LESI 07/14/23)   How did pain start chronic   Pain: (from 0 - 10) 8   Worst pain rating  10   Number of worst pain incidents in last week More than 8   Words to describe Aching;Sharp   Timing of pain Constant   What exacerbates pain Lifting;Stair Climbing;Standing;Sitting;Walking   Past Pain Medication NSAIDs;Muscle Relaxers;Prescription Pain Medications   Previous Surgeries lumbar fusioon 2016   Activities limited by pain everythinh          BP 122/70   Pulse 56   Ht 1.778 m (5' 10)   Wt 84 kg (185 lb 1.6 oz)   SpO2 99%   BMI 26.56 kg/m           Physical Exam  Vitals and nursing note reviewed.   Constitutional:       Appearance: Normal appearance.   HENT:      Head: Normocephalic and atraumatic.   Pulmonary:      Effort: Pulmonary effort is normal.   Musculoskeletal:      Cervical back: Pain with movement and muscular tenderness present. Decreased range of motion (especially right rotation).        Back:         Legs:       Comments: +FABER bilaterally with reproduction of pain   Neurological:      General: No focal deficit present.      Mental Status: He is alert and oriented to person, place, and time.      Motor: No weakness or  atrophy.      Gait: Gait is intact.   Psychiatric:         Mood and Affect: Mood normal.         Thought Content: Thought content normal.         Judgment: Judgment normal.           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      FINDINGS: There is normal alignment of cervical spine. No fracture or dislocation is seen, with vertebral body heights preserved. Bone marrow signal is within normal limits. Focal fat density of the C2 vertebral body is likely a small hemangioma.    At C2-C3 there is no spinal canal or neural foraminal stenosis.  At C3-C4 facet arthropathy results in mild bilateral neural foraminal narrowing. No spinal canal narrowing is seen.    At C4-C5 uncovertebral and facet arthropathy results in moderate left and mild right neural foraminal narrowing. A central left paracentral disc herniation results in mild spinal canal narrowing.    At C5-C6 there is central and paracentral zone disc herniation resulting in mild to moderate spinal canal narrowing. There is moderate bilateral neural foraminal narrowing due to uncovertebral and facet osteoarthropathy.    At C6-C7 there is moderate bilateral neural foraminal narrowing. No spinal canal narrowing is seen.    At C7-T1 there is no spinal canal narrowing. Mild bilateral neural foraminal narrowing is seen.    No abnormal signal is seen in the spinal cord. The soft tissues of the neck are unremarkable.      Impression    Mild to moderate degenerative changes of the cervical spine, with mild to moderate spinal canal narrowing at C5-C6 as well as  moderate neural foraminal narrowing on the left at C4-C5 and bilaterally at C5-C6 and C6-C7.            Radiologist location ID: [id number]         Recent Results (from the past [id number] hours)   MRI SPINE LUMBOSACRAL WO CONTRAST    Collection Time: 05/03/24  1:20 PM    Narrative    MRI lumbar spine canal without contrast. Comparison 11/26/2022.  HISTORY: Chronic low back pain, radiating to left leg and thigh.    4 mm sagittal and axial scans were obtained with T1, T2 and STIR sequences. No fracture, destructive process or pathologic marrow replacement is evident. There is benign reactive fatty marrow replacement adjacent to the L3-4 and L4-5 discs. The conus and cauda equina appear normal, with conus tip at L1. No retroperitoneal abnormality is evident.    T12-L1: Normal  L1-L2: Broad disc osteophyte complex, with mild central stenosis.  L2-3: Larger diffuse disc osteophyte complex, with bilateral facet hypertrophy. Sac is compressed, with effacement of CSF from nerve roots, similar to the 2024 exam. No nerve root distortion is seen.  L3-L5: Postsurgical changes, with discectomy and L3-4 and L4-5 disc implant placement. There has also been wide laminectomy, with excellent decompression of the canal. No significant foraminal stenosis.  L5-S1: No disc abnormality. Bilateral facet arthrosis with new 4-5 mm cystic area anterosuperior to the left L5-S1 facet, consistent with synovial cyst. This contacts the lateral margin of the left S1 root (axial images 30 and 31, series 9).      Impression    1. 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  2. Facet arthrosis is marked at L5-S1, with interval development of 4-5 mm synovial cyst at the anterosuperior aspect of the left L5-S1 facet. This contacts the descending left S1 root.      Radiologist location ID: [id number]     MRI SPINE LUMBOSACRAL W/WO CONTRAST    Collection  Time: 05/04/22  1:07 PM    Narrative    History: Low back pain.    TECHNIQUE: Multiplanar multi sequence MR imaging lumbar spine is performed with and without the use of intravenous contrast.    There are postoperative changes of L3 and L4 laminectomies with posterior stabilization from L3 through L5 causing transpedicular screws, spinal rods, and intervertebral disc prosthesis. Spinal hardware does result in magnetic sensibility artifact limiting the exam. Alignment is near-anatomic with slight straightening of the  lumbar lordosis. Type I and type II degenerative endplate changes are seen with mild endplate invagination at L3-4 and L4-5. There is no fracture.    At the L1-2 level, there is narrowing and desiccation of intervertebral disc. There is generalized disc bulging and early facet joint arthrosis. This results in moderate right and mild left neural foraminal encroachment. There is effacement of the thecal sac with no significant spinal stenosis.    At the L2-3 level, there is narrowing and desiccation of intervertebral disc. There is a large generalized disc bulge with prominent posterior osteophyte formation. This combines with facet joint arthrosis and prominent ligamentum flavum to cause moderate right greater than left neural foraminal encroachment and moderate spinal canal stenosis. There is suspected impingement on the left L2 nerve root laterally.    At the L3-4 and L4-5 levels, the neural foramen appear patent bilaterally. There is good decompression of the thecal sac with no significant spinal stenosis.    At the L5-S1 level, there is mild disc bulging and posterior osteophyte formation. There is prominent facet joint arthrosis. This results in a moderate left and mild right neural foraminal encroachment. Of note, the thecal sac is extremely small at this level      Impression    1. Postoperative changes of posterior stabilization from L3 through L5 as described above. Alignment is near-anatomic  with slight straightening of the lumbar lordosis.  2. 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  3. The findings are most pronounced at L2-3 where there is moderate spinal canal stenosis and bilateral neural foraminal encroachment secondary to disc bulging, osteophyte formation, facet joint arthrosis, and prominent ligamentum flavum. There is suspected impingement on the left L2 nerve root laterally at this level.      Radiologist location ID: [id number]             Assessment and Plan    (M54.16) Lumbar radiculopathy  (primary encounter diagnosis)  Plan: Left, TFESI, L4, TFESI (2nd Level), L5, Next         Available Provider Prior Auth/Scheduling         Procedure, PAIN CL FL TRANSFORAMINAL EPIDURAL         LUMBAR/SACRAL 1 LEVEL, INJ         ANESTHETIC/STEROID-TRANSFORAMINAL EPIDURAL,W         FLUORO,LUMBAR/SACRAL, Dr. Nicholaus Consult (needs         [facility] referral) Prior Auth/Scheduling Procedure,         Verify VA auth ([place]) Prior Auth/Scheduling         Procedure    (G89.4) Chronic pain syndrome  Plan: Dr. Nicholaus Consult (needs VA referral) Prior         Auth/Scheduling Procedure    (Z98.1) History of fusion of lumbar spine       1) Will check with the [facility] regarding his auth to be sure that it is comprehensive.   Consider xray neck, bilateral shoulders, MRI cervical spine  Consider cervical facet blocks, bilateral shoulder injections    2) For the low back pain discussed SI joint dysfunction. He reports relief with left TFESI x3 in the past, however, to his bilateral low back pain. Will proceed with repeat TFESI since this has worked in the past although I did explain to the patient that he lacks the radicular presentation.     3) Will refer him onward to Dr. Nicholaus for consideration of opioid management.     Patient is provided with written  education material regarding the discussed procedure.    Discussed with the patient the benefits and risk of epidural steroid injection:      The benefit of injection would be:    1. Decreasing in the baseline pain levels  2. Improvement in the functional status overall in terms of doing more activity comfortably     Risk for the injection would be:  1. Infection in the spine, meningitis or skin where the injection was performed  2. Nerve damage in the spine resulting in permanent numbness, tingling and/or paralysis in the upper and/or lower extremities  3. Failure of injection to relieve the pain  4. Worsening of pain  5. Spinal fluid leak and headaches after the procedure that might require further intervention in form of blood patch or surgical repair  6. Complication resulting from excessive steroid resulting in dizziness, headaches, facial erythema, transient hypertension, gastritis, pruritus and weight gain  7. Complication related to anesthesia and medication allergies resulting in anaphylaxis  8. Bleeding resulting in epidural hematoma requiring monitoring or surgical evacuation       Follow up: Return for Left, TFESI, L4, TFESI (2nd Level), L5, Next Available Provider.    MDM    Number and Complexity of Problems Addressed  1 or more chronic illness with exacerbation, progression, or side effects of treatment    Risk of Complications and/or Morbidity or Mortality of Patient Management  Moderate        Electronically Signed:   Augustin Bennetts, PA-C  06/25/2024, 11:50    This note may have been partially generated using MModal Fluency Direct system, and there may be some incorrect words, spellings, and punctuation that were not noted in checking the note before saving, though effort was made to avoid such errors.       [1]   Allergies  Allergen Reactions    Propofol Rash and  Other Adverse Reaction (Add comment)     High temperature for about 2 weeks (103-104 degree temp); Rash    Lithium  Other Adverse Reaction (Add comment)     Blurred vision   [2]   Social History  Tobacco Use    Smoking status: Light Smoker     Types: Cigars    Smokeless  tobacco: Never    Tobacco comments:     OCCASIONAL SMOKING-1 CIGAR EVERY 2-4 WEEKS   Vaping Use    Vaping status: Never Used   Substance Use Topics    Alcohol use: Yes     Comment: Occasional    Drug use: Never

## 2024-06-28 ENCOUNTER — Ambulatory Visit (HOSPITAL_BASED_OUTPATIENT_CLINIC_OR_DEPARTMENT_OTHER): Payer: Self-pay | Admitting: Physician Assistant

## 2024-07-01 ENCOUNTER — Ambulatory Visit (HOSPITAL_BASED_OUTPATIENT_CLINIC_OR_DEPARTMENT_OTHER): Payer: Self-pay | Admitting: Physician Assistant

## 2024-07-02 NOTE — Patient Instructions (Signed)
 [facility]    [address]  [place], [place] [address]  Phone: [phone]  Fax: [phone]    Today you had a Left Transforaminal Epidural Steroid Injection L4 L5    Post procedure discharge instructions:    Though this procedure is generally safe, and complications are rare, we do ask for you to look for any of the following:    - Swelling, persistent redness and bleeding or discharge from the site of the injection  - Fever and chills  - New onset of severe back pain  - New onset of upper or lower extremity numbness and/or weakness lasting longer than 24 hours  - Difficulty controlling bowel and/or bladder function after the injection    If you experience any of the above, please contact our staff at the number provided above during business hours. After 4:30 pm, weekends, and holidays please go to your local Emergency Room and show them this instruction sheet.    After your injection:    - No driving or operating heavy equipment for 24 hours after the injection  - Rest for the remainder of the day (unless otherwise specified)  - Remove band aid(s) after 24 hours; they do not need to be replaced  - No shower, bath, Jacuzzi, or pool for 24 hours  - Use ice for discomfort at the injection site(s) for the first 24 hours then heat if needed. (20 minutes every 3 hours as needed)  - When applying ice/heat pack ALWAYS use a cloth barrier between your skin and the ice/heat pack  - If you received a steroid medication today you may experience hot flashes and facial redness/flushing and increased appetite.  - Please be aware you are at High Risk of falling due to loss of reflexes  - If you stopped a Blood Thinner you may resume on the next day after your procedure.      If you have any questions please call the nurses line at  [phone] Option 2

## 2024-07-03 ENCOUNTER — Telehealth (HOSPITAL_BASED_OUTPATIENT_CLINIC_OR_DEPARTMENT_OTHER): Payer: Self-pay | Admitting: Emergency Medicine

## 2024-07-03 NOTE — Nursing Note (Signed)
 PAIN PRE-ASSESSMENT TELEPHONE CALL    Verify Procedure - Transforaminal Epidural Steroid Injection L4 and L5 Left # 1    Verify Insurance - [facility]    Exposure to COVID? No    Gender: Male     If male, currently pregnant:  N/A,   N/A    Taking antibiotics or steroids: No    Taking anticoagulants/antiplatelets/NSAID'S: No     Last dose: N/A    Is patient diabetic: No     If yes, advised to eat prior to arrival:  No    Was pre-op Valium ordered: No      If yes, did RX get sent: No    Person interviewed: Sean Wall    Relationship to patient: self    Arrival date and time verified: Yes    Arrival time given to patient: 10 00    Arrival location verified: Yes    Communicated need to leave valuables at home: Yes    Advised to wear comfortable clothing: Yes    Ride and caregiver arranged: Yes    Name of ride/caregiver: unsure    Phone number for ride/caregiver: unsure    Provider questions (routed to provider):  No          Sean LITTIE Goldsmith, LPN  88/80/7974, 08:39

## 2024-07-04 ENCOUNTER — Ambulatory Visit: Payer: Self-pay | Attending: Emergency Medicine | Admitting: Emergency Medicine

## 2024-07-04 ENCOUNTER — Ambulatory Visit (HOSPITAL_BASED_OUTPATIENT_CLINIC_OR_DEPARTMENT_OTHER)
Admission: RE | Admit: 2024-07-04 | Discharge: 2024-07-04 | Disposition: A | Discharge status: Home / Self Care | Payer: Self-pay | Source: Ambulatory Visit | Attending: Emergency Medicine | Admitting: Emergency Medicine

## 2024-07-04 ENCOUNTER — Other Ambulatory Visit: Payer: Self-pay

## 2024-07-04 DIAGNOSIS — M5416 Radiculopathy, lumbar region: Secondary | ICD-10-CM | POA: Insufficient documentation

## 2024-07-04 DIAGNOSIS — Z981 Arthrodesis status: Secondary | ICD-10-CM | POA: Insufficient documentation

## 2024-07-04 DIAGNOSIS — G894 Chronic pain syndrome: Secondary | ICD-10-CM | POA: Insufficient documentation

## 2024-07-04 DIAGNOSIS — M545 Low back pain, unspecified: Secondary | ICD-10-CM | POA: Insufficient documentation

## 2024-07-04 MED ORDER — DEXAMETHASONE SODIUM PHOSPHATE (PF) 10 MG/ML INJECTION SOLUTION
10.0000 mg | Freq: Once | INTRAMUSCULAR | Status: AC
Start: 2024-07-04 — End: 2024-07-04
  Administered 2024-07-04: 10 mg via INTRAMUSCULAR

## 2024-07-04 MED ORDER — BUPIVACAINE (PF) 0.5 % (5 MG/ML) INJECTION SOLUTION
1.0000 mL | Freq: Once | INTRAMUSCULAR | Status: AC
Start: 2024-07-04 — End: 2024-07-04
  Administered 2024-07-04: 1 mL via INTRAMUSCULAR

## 2024-07-04 MED ORDER — IOHEXOL 180 MG IODINE/ML INTRATHECAL SOLUTION
1.0000 mL | Freq: Once | INTRATHECAL | Status: AC
Start: 2024-07-04 — End: 2024-07-04
  Administered 2024-07-04: 1 mL via EPIDURAL

## 2024-07-04 NOTE — Nursing Note (Signed)
 07/04/24 1058   Procedure ROS  Assessment   Time Point Intra Procedure   Skin WDL   Cognitive/Neuro/Behavioral WDL   Respiratory WDL   Positioning prone   Patient Safety Wheels Locked;ID band on;Fall precautions maintained   Overall Status Vital Signs Stable;Alert and Oriented   Procedure   Consent Obtained for Procedure Yes   Correct Procedure Verbalized (Per Patient/Team) Yes   Correct Site/Side Verbalized Yes   Time Out Performed Yes   Time Out Verified By S. Kinzly Pierrelouis RN   Prep Solution Chloroprep   Prep done by S. Renato, RN   Provider Present Dr. Madge   RN Present S. Renato, RN   Rad Tech Present N. Lebron   Surg Tech Present B. Radcliff

## 2024-07-04 NOTE — Nursing Note (Signed)
 07/04/24 1007   Procedure Cklist   Time Point Pre Procedure   Patient arrived via: Ambulatory   Patient arrived from: Home   Patient arrived with assistive device or barrier NA   Patient accompanied by: Spouse   Name of Ride/Escort home paula   Correct Patient Yes   ID band is on and information verified with patient? Yes   Procedure and site verified: Verbally with patient   Correct Laterality Left   Correct Procedure Yes   Allergies Verified: Yes   Procedure Consent: AM of procedure   Procedure Consent completed and signed? Yes   Any metal/other implants? Yes   Type of Metal/Other Implants Surgical Rods/Pins/Plates/Screws  (back)   Does Patient Use Tobacco? Yes   Date of Last Tobacco Use 07/04/24   Time of Last Tobacco Use   (3 months)   Has patient taken Antibiotic or Steroid in the past 10 days? ([facility], [facility] Only) No   Is patient on anticoagulant/antiplatelet therapy (ASA/NSAIDS, Coumadin / Warfarin) No   Patient has a pacemaker or ICD? No   Pain Assessment   Time Point Pre Procedure   Pain Score 7   Words to describe pain aching   Pain Location Back   Pain Orientation Lower   Aggravating Factors movement   Alleviating Factors medication   How Long have you had this pain?   (months)

## 2024-07-04 NOTE — Procedures (Signed)
 [facility]  [address]  [place] [address]  Operated by [facility]  Procedure Note    Name: Sean Wall MRN:  [id number]   Date: 07/04/2024 DOB:  1956/01/01 (68 y.o.)         INJ ANESTHETIC/STEROID-TRANSFORAMINAL EPIDURAL,W FLUORO,LUMBAR/SACRAL    Performed by: Madge Duke, MD  Authorized by: Kaylyn Duncan, PA-C    Immediately before the procedure, a time out was called:  Yes  Patient verified:  Yes  Procedure verified:  Yes  Site verified:  Yes  Number of levels::  2  (Note:  For bilateral procedure, add modifier 50 to appropriate charge in charge capture.):      Procedure: Diagnostic fluoroscopy guided transforaminal epidural steroid injection at left L4 &L5  Diagnosis: Lumbar radiculopathy left sided     Surgeon: Duke Madge, MD  Assistant: None   Anesthesia:  Local  Pre procedure pain level: 7/10       This patient known to our [facility] for Low back pain and lumbar radiculopathy, scheduled for a Transforaminal epidural steroid injection under fluoroscopy guidance, Upon discussion of potential risks and benefits of the procedure including the option of not doing the procedure today, the patient elected to go ahead with the procedure and gave written informed consent    The procedure:   The patient was brought to the procedure room and placed prone on the procedure table with pillows under the abdomen and chest. Hemodynamic monitors consisting of pulse oximetry were applied and the patient remained hemodynamically stable all throughout the procedure, the back was extensively prepped and draped in a standard sterile fashion with Chlorhexidine. The C arm was brought above the patient's back and X-ray were taken to identify the appropriate Neuroforamen, we anesthetized the skin at the previously identified neuroforamen level with 2 cc of 1% lidocaine  via a 25 G 1 1/2-inch needle. Through the anesthetized skin, we introduced a 22 G 5 inch Quincke  spinal needle into left L4-L5 neuroforamen under intermittent fluoroscopic guidance using oblique C arm views, Lateral X-ray was taken and confirmed the needle placement in the appropriate neuroforamen. After negative aspiration for Heme, CSF and fluids and no paresthesia whatsoever we injected 2 cc of Omnipaque  contrast to confirm placement. 5 mg of decadron  with 1 cc of Bupivacaine  0.5%. Needle was taken out intact.  Same procedure was repeated at the left L5-S1 neural foramen.  The patient tolerated the procedure very well. Back was cleaned and sterile Band-Aids were applied. The patient returned to the Recovery Room and recovered for an additional 20 minutes before being discharged home with written discharge instructions and a ride.    Plan:    Patient tolerated the procedure well today, the patient will come back to follow up with me in the clinic in one month.      Electronically Signed: Duke Madge, MD 07/04/2024, 10:55

## 2024-07-08 ENCOUNTER — Encounter (HOSPITAL_BASED_OUTPATIENT_CLINIC_OR_DEPARTMENT_OTHER): Payer: Self-pay

## 2024-07-15 NOTE — Nursing Note (Signed)
 07/04/24 1105   Adult Vitals   BP (Non-Invasive) 118/74   Heart Rate 51   SpO2 100 %

## 2024-07-15 NOTE — Nursing Note (Signed)
 07/04/24 1045   Procedure ROS  Assessment   Time Point Pre Procedure   Skin WDL   Cognitive/Neuro/Behavioral WDL   Respiratory WDL   Musculoskeletal WDL   Positioning Sitting   Patient Safety Up in Chair;Wheels Locked;ID band on;Fall precautions maintained   Cardiac WDL   Gastrointestinal WDL   Genitourinary WDL   Overall Status Vital Signs Stable;Ambulates without difficulty;Protective reflexes present;Alert and Oriented

## 2024-07-15 NOTE — Nursing Note (Signed)
 07/04/24 1105   Procedure ROS  Assessment   Time Point Post Procedure   Skin WDL   Cognitive/Neuro/Behavioral WDL   Positioning Sitting   Patient Safety Up in Chair;Wheels Locked;ID band on;Fall precautions maintained   Overall Status Vital Signs Stable;Ambulates without difficulty;Protective reflexes present;Alert and Oriented   Pain Assessment   Time Point Post Procedure   Pain Score 4   Post Procedure Dressing   Dressing Type Bandaid  (X 2)   Dressing Status Clean;dry;intact;Secure   Discharge instructions reviewed Yes   Patient verbalized understanding of discuarge instructions Yes

## 2024-08-07 ENCOUNTER — Ambulatory Visit: Payer: Self-pay | Attending: Physician Assistant | Admitting: Physician Assistant

## 2024-08-07 ENCOUNTER — Other Ambulatory Visit: Payer: Self-pay

## 2024-08-07 ENCOUNTER — Encounter (HOSPITAL_BASED_OUTPATIENT_CLINIC_OR_DEPARTMENT_OTHER): Payer: Self-pay | Admitting: Physician Assistant

## 2024-08-07 VITALS — BP 118/74 | HR 67 | Ht 70.0 in | Wt 189.7 lb

## 2024-08-07 DIAGNOSIS — M25512 Pain in left shoulder: Secondary | ICD-10-CM | POA: Insufficient documentation

## 2024-08-07 DIAGNOSIS — M5416 Radiculopathy, lumbar region: Secondary | ICD-10-CM | POA: Insufficient documentation

## 2024-08-07 DIAGNOSIS — M25511 Pain in right shoulder: Secondary | ICD-10-CM | POA: Insufficient documentation

## 2024-08-07 DIAGNOSIS — M25561 Pain in right knee: Secondary | ICD-10-CM | POA: Insufficient documentation

## 2024-08-07 DIAGNOSIS — M47812 Spondylosis without myelopathy or radiculopathy, cervical region: Secondary | ICD-10-CM | POA: Insufficient documentation

## 2024-08-07 DIAGNOSIS — M25562 Pain in left knee: Secondary | ICD-10-CM | POA: Insufficient documentation

## 2024-08-07 DIAGNOSIS — M4722 Other spondylosis with radiculopathy, cervical region: Secondary | ICD-10-CM

## 2024-08-07 DIAGNOSIS — M5412 Radiculopathy, cervical region: Secondary | ICD-10-CM | POA: Insufficient documentation

## 2024-08-07 MED ORDER — BUPIVACAINE (PF) 0.25 % (2.5 MG/ML) INJECTION SOLUTION
4.0000 mL | INTRAMUSCULAR | Status: AC
  Administered 2024-08-07: 4 mL

## 2024-08-07 MED ORDER — METHYLPREDNISOLONE ACETATE 40 MG/ML SUSPENSION FOR INJECTION
40.0000 mg | INTRAMUSCULAR | Status: AC
  Administered 2024-08-07: 40 mg via INTRAMUSCULAR

## 2024-08-07 NOTE — Progress Notes (Signed)
 [facility]  [address]  [place] [address]  Operated by [facility]     Name: Sean Wall MRN:  [id number]   Date: 08/07/2024 Age: 68 y.o.        Progress Note    Reason for Visit: Follow Up After Injection  (1 month follow up Left TFESI L4, L5, Dr.Mrad, 07-04-24)    History of Present Illness  Sean Wall is a 68 y.o. male who is being seen today for follow up.     08/07/2024-- The patient is following up after left TFESI at L4 and L5 with Dr. Madge on 07/04/2024. The patient reports minimal change. He reports that this was not like his injections in [place] in that he didn't it the nerve and only did 2 levels. He states that they did 3 levels. He reports no recreation of pain down his leg which he usually experienced in [place]. Also, patient was not able to be seen by Dr. Nicholaus for hydrocodone  consult. The office reportedly was not able to see [facility]. Since the patient was last evaluated updated authorization to cover comprehensive pain management was received. He endorses right greater than left shoulder pain in the anterior and referral of pain from the bilateral neck into the right UE, terminating before the elbow. He also has bilateral knee pain and crepitus.     Initial HPI 06/25/2024 -- Patient was referred for his low back pain but prioritizes his neck pain. He also endorses bilateral shoulder pain, right hip/groin/inguinal pain for which he would like to pursue treatment. Patient indicates treatment in the past with hydrocodone  and reports relief with this.     He reports bilateral low back pain which is his secondary concern. This is located right > left of midline off into the paraspinal region. He has pain that extends from the low back into the buttock and thighs, generally terminating about the knee. He first presented with severe sciatica and underwent lumbar spine fusion. He has undergone TFESI on the left on three  occasions covering L4-5 and L5-S1 which covered his pain and this is what he is most interested in pursuing in regards to his lumbosacral symptoms.     He reports right shoulder pain chronically and was offered shoulder replacement but he is putting that off as much as possible. He was undergoing bilateral shoulder injections (with right knee) same day at his previous pain clinic. He has a history of 3 surgeries on his left side.     His chief concern is bilateral neck pain. Present up the neck through the occiput. Generally located on the left but when most severe he has pain on the right and it seems to extending into the shoulder and arm.     Relevant ROS negative except as noted in HPI      Location:  (Low back pain that radiates down the left leg to the knee)  Worst pain rating : 9  Number of worst pain incidents in last week: 3-4  Words to describe: Sharp, Aching  Timing of pain: Constant  What exacerbates pain: Sitting, Walking, Standing  Activities limited by pain: Sitting, standing, walking for long periods of time  Previous Pain Procedures: TFESI (Left L4, L5)  Pain relief after last procedure: 10%  Functional Status Improvement: None          Past Medical History:   Diagnosis Date    Acid reflux  CPAP (continuous positive airway pressure) dependence     Enlarged prostate     Epigastric pain 12/29/2022    Erectile dysfunction     H/O hearing loss     Headache     Health education/counseling 12/30/2022    Hemorrhoids     History of anesthesia complications     Propofol allergy - Rash; high temperature for about 2 weeks; 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 Unice. Right 2018 Dr Unice JOY CYST REMOVAL Right 1990    right wrist and right ankle. [employer]    HX LUMBAR FUSION  2018    hardware in place--Dr. Unice JOY NOSE/SINUS SURGERY  2005    deviated septum repair. Dr. Mable JOY SHOULDER SURGERY Bilateral     5 surgeries on right and 3 on the left    HX TURP      HX VASECTOMY      Dr. Duncan FONTANA HERNIA REPAIR Right 1991    [employer]    LASIK Bilateral 2004    ULNAR TUNNEL RELEASE Left          Current Outpatient Medications   Medication Sig    atorvastatin  (LIPITOR) 40 mg Oral Tablet Take 1 Tablet (40 mg total) by mouth Every evening    azelastine-fluticasone 137-50 mcg/spray Nasal Spray, Non-Aerosol Administer 2 Sprays into affected nostril(s) Every night as needed Only uses if having a issue    buPROPion  (WELLBUTRIN  XL) 300 mg extended release 24 hr tablet Take 1 Tablet (300 mg total) by mouth Daily    cetirizine (ZYRTEC) 10 mg Oral Tablet Take 1 Tablet (10 mg total) by mouth Every night as needed for Allergies    cyanocobalamin  (VITAMIN B12) 1,000 mcg/mL Injection Solution 1 mL (1,000 mcg total) Every 30 days Takes on the 23rd    cyclobenzaprine  (FLEXERIL ) 10 mg Oral Tablet Take 1 Tablet (10 mg total) by mouth Three times a day as needed Usually take at one efery  [facility]    dextroamphetamine-amphetamine (ADDERALL) 10 mg Oral Tablet Take 1 Tablet (10 mg total) by mouth Once per day as needed for Other    finasteride  (PROSCAR ) 5 mg Oral Tablet Take 1 Tablet (5 mg total) by mouth Every evening    HYDROcodone -acetaminophen  (NORCO) 5-325 mg Oral Tablet Take 1 Tablet by mouth Every 8 hours as needed for Pain    Ibuprofen (MOTRIN) 600 mg Oral Tablet Take 1 Tablet (600 mg total) by mouth Four times a day as needed for Pain (Patient taking differently: Take 800 mg by mouth Three times a day)    melatonin 3 mg Oral Capsule Take 2  Capsules (6 mg total) by mouth Every night    memantine (NAMENDA) 5 mg Oral Tablet Take 1 Tablet (  5 mg total) by mouth Twice daily    metoprolol  succinate (TOPROL -XL) 25 mg Oral Tablet Sustained Release 24 hr Take 0.5 Tablets (12.5 mg total) by mouth Every evening    pantoprazole  (PROTONIX ) 20 mg Oral Tablet, Delayed Release (E.C.) Take 1 Tablet (20 mg total) by mouth Every evening    propranoloL (INDERAL) 20 mg Oral Tablet Take 1 Tablet (20 mg total) by mouth Twice daily    QUEtiapine  (SEROQUEL ) 50 mg Oral Tablet Take 1 Tablet (50 mg total) by mouth Every night    sumatriptan  succinate (IMITREX ) 100 mg Oral Tablet Take 1 Tablet (100 mg total) by mouth Once, as needed for Migraine May repeat in 2 hours in needed    tadalafil  (CIALIS ) 5 mg Oral Tablet Take 1 Tablet (5 mg total) by mouth Every 24 hours as needed for up to 360 days Indications: enlarged prostate with urination problem, the inability to have an erection    Testosterone 1 % (25 mg/2.5gram) Transdermal Gel in Packet Place 2 Each on the skin Once a day 2 pumps each shoulder daily am     Allergies[1]  Family Medical History:       Problem Relation (Age of Onset)    Diabetes Mother    No Known Problems Father, Sister, Brother, Half-Sister, Half-Brother, Maternal Aunt, Maternal Uncle, Paternal Aunt, Paternal Uncle, Maternal Grandmother, Maternal Grandfather, Paternal Grandmother, Paternal Grandfather, Daughter, Son, Other            Social History[2]    Nurse Notes:    Nursing Notes:   Nikki Fleeting, [place]  08/07/24 0959  Signed     08/07/24 0956   [facility]   Location   (Low back pain that radiates down the left leg to the knee)   Pain: (from 0 - 10) 7   Worst pain rating  9   Number of worst pain incidents in last week 3-4   Words to describe Sharp;Aching   Timing of pain Constant   What exacerbates pain Sitting;Walking;Standing   Activities limited by pain Sitting, standing, walking for long periods of time   Previous Pain Procedures TFESI  (Left L4,  L5)   Pain relief after last procedure 10%   Functional Status Improvement None         Emeterio Domino, LPN  87/75/74 8968  Signed     08/07/24 1000   Time Out Procedure   Consent Obtained for Procedure Yes   Correct Procedure Verbalized (Per Patient/Team) Yes   Allergies Verified: Yes   Correct Site/Side Verbalized Yes   Time Out Performed Yes   Participants Introduced/Known Yes   Participants of Time Out KYM Kaylyn RIGGERS, CANDIE Emeterio LPN   Time Out Verified By CANDIE Emeterio LPN   Time 8968   Date  08/07/24   Staff Completing Form S. Emerick LPN          BP 118/74   Pulse 67   Ht 1.778 m (5' 10)   Wt 86 kg (189 lb 11.2 oz)   SpO2 96%   BMI 27.22 kg/m           Physical Exam  Vitals and nursing note reviewed.   Constitutional:       Appearance: Normal appearance.   HENT:      Head: Normocephalic and atraumatic.   Pulmonary:      Effort: Pulmonary effort is normal.   Musculoskeletal:      Cervical back: Pain with movement and muscular tenderness present. Decreased  range of motion (especially right rotation).      Comments: +FABER bilaterally with reproduction of pain   Neurological:      General: No focal deficit present.      Mental Status: He is alert and oriented to person, place, and time.      Motor: No weakness or atrophy.      Gait: Gait is intact.   Psychiatric:         Mood and Affect: Mood normal.         Thought Content: Thought content normal.         Judgment: Judgment normal.           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      FINDINGS: There is normal alignment of cervical spine. No  fracture or dislocation is seen, with vertebral body heights preserved. Bone marrow signal is within normal limits. Focal fat density of the C2 vertebral body is likely a small hemangioma.    At C2-C3 there is no spinal canal or neural foraminal stenosis.    At C3-C4 facet arthropathy results in mild bilateral neural foraminal narrowing. No spinal canal narrowing is seen.    At C4-C5 uncovertebral and facet arthropathy results in moderate left and mild right neural foraminal narrowing. A central left paracentral disc herniation results in mild spinal canal narrowing.    At C5-C6 there is central and paracentral zone disc herniation resulting in mild to moderate spinal canal narrowing. There is moderate bilateral neural foraminal narrowing due to uncovertebral and facet osteoarthropathy.    At C6-C7 there is moderate bilateral neural foraminal narrowing. No spinal canal narrowing is seen.    At C7-T1 there is no spinal canal narrowing. Mild bilateral neural foraminal narrowing is seen.    No abnormal signal is seen in the spinal cord. The soft tissues of the neck are unremarkable.      Impression    Mild to moderate degenerative changes of the cervical spine, with mild to moderate spinal canal narrowing at C5-C6 as well as moderate neural foraminal narrowing on the left at C4-C5 and bilaterally at C5-C6 and C6-C7.            Radiologist location ID: [id number]         Recent Results (from the past [id number] hours)   MRI SPINE LUMBOSACRAL WO CONTRAST    Collection Time: 05/03/24  1:20 PM    Narrative    MRI lumbar spine canal without contrast. Comparison 11/26/2022.  HISTORY: Chronic low back pain, radiating to left leg and thigh.    4 mm sagittal and axial scans were obtained with T1, T2 and STIR sequences. No fracture, destructive process or pathologic marrow replacement is evident. There is benign reactive fatty marrow replacement adjacent to the L3-4 and L4-5 discs. The conus and cauda equina appear normal, with  conus tip at L1. No retroperitoneal abnormality is evident.    T12-L1: Normal  L1-L2: Broad disc osteophyte complex, with mild central stenosis.  L2-3: Larger diffuse disc osteophyte complex, with  bilateral facet hypertrophy. Sac is compressed, with effacement of CSF from nerve roots, similar to the 2024 exam. No nerve root distortion is seen.  L3-L5: Postsurgical changes, with discectomy and L3-4 and L4-5 disc implant placement. There has also been wide laminectomy, with excellent decompression of the canal. No significant foraminal stenosis.  L5-S1: No disc abnormality. Bilateral facet arthrosis with new 4-5 mm cystic area anterosuperior to the left L5-S1 facet, consistent with synovial cyst. This contacts the lateral margin of the left S1 root (axial images 30 and 31, series 9).      Impression    1. 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  2. Facet arthrosis is marked at L5-S1, with interval development of 4-5 mm synovial cyst at the anterosuperior aspect of the left L5-S1 facet. This contacts the descending left S1 root.      Radiologist location ID: [id number]     MRI SPINE LUMBOSACRAL W/WO CONTRAST    Collection Time: 05/04/22  1:07 PM    Narrative    History: Low back pain.    TECHNIQUE: Multiplanar multi sequence MR imaging lumbar spine is performed with and without the use of intravenous contrast.    There are postoperative changes of L3 and L4 laminectomies with posterior stabilization from L3 through L5 causing transpedicular screws, spinal rods, and intervertebral disc prosthesis. Spinal hardware does result in magnetic sensibility artifact limiting the exam. Alignment is near-anatomic with slight straightening of the lumbar lordosis. Type I and type II degenerative endplate changes are seen with mild endplate invagination at L3-4 and L4-5. There is no fracture.    At the L1-2 level, there is narrowing  and desiccation of intervertebral disc. There is generalized disc bulging and early facet joint arthrosis. This results in moderate right and mild left neural foraminal encroachment. There is effacement of the thecal sac with no significant spinal stenosis.    At the L2-3 level, there is narrowing and desiccation of intervertebral disc. There is a large generalized disc bulge with prominent posterior osteophyte formation. This combines with facet joint arthrosis and prominent ligamentum flavum to cause moderate right greater than left neural foraminal encroachment and moderate spinal canal stenosis. There is suspected impingement on the left L2 nerve root laterally.    At the L3-4 and L4-5 levels, the neural foramen appear patent bilaterally. There is good decompression of the thecal sac with no significant spinal stenosis.    At the L5-S1 level, there is mild disc bulging and posterior osteophyte formation. There is prominent facet joint arthrosis. This results in a moderate left and mild right neural foraminal encroachment. Of note, the thecal sac is extremely small at this level      Impression    1. Postoperative changes of posterior stabilization from L3 through L5 as described above. Alignment is near-anatomic with slight straightening of the lumbar lordosis.  2. 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  3. The findings are most pronounced at L2-3 where there is moderate spinal canal stenosis and bilateral neural foraminal encroachment secondary to disc bulging, osteophyte formation, facet joint arthrosis, and prominent ligamentum flavum. There is suspected impingement on the left L2 nerve root laterally at this level.      Radiologist location ID: [id number]             Assessment and Plan    (M54.16) Lumbar radiculopathy  (primary encounter diagnosis)  Plan: Dr.  Mrad, Left, TFESI, L3, TFESI (2nd Level),         L4 ([facility] no sooner than 10/04/24) Prior          Auth/Scheduling Procedure, PAIN CL FL         TRANSFORAMINAL EPIDURAL LUMBAR/SACRAL 1 LEVEL,         INJ ANESTHETIC/STEROID-TRANSFORAMINAL         EPIDURAL,W FLUORO,LUMBAR/SACRAL    (M54.12) Cervical radiculopathy  Plan: CESI C7-T1, Dr. Madge, ([place]) Prior         Auth/Scheduling Procedure, PAIN CL FL EPIDURAL         STEROID INJECTION CERVICAL, 37678 - INJECT OF         DIAG/THERAP SUBSTANCE, INTERLAMINAR         EPIDURAL/SUBARACHNOID, CERVICAL/THORACIC; W/         IMAGING GUIDANCE (AMB ONLY)    (M47.812) Cervical spondylosis    (M25.511,  M25.512) Bilateral shoulder pain    (M25.561,  M25.562) Bilateral knee pain       1) I reviewed the fluoro images with the patient and his wife. I showed the needle placement and good flow with contrast. I also reviewed the procedure note with them from [facility] showing the exact same procedure and also reviewed the fluoro showing the same. I acknowledged that the procedure can feel different especially when being seen by a different provider but tired to provide reassurance that we are providing the same type of care. Will plan on repeat TFESI after 3 month interval. Will include L3-4 this time as he had this level treated as well with [facility]. Consider SI joint injection.     2) Patient will follow up with Dr. Nicholaus' office directly regarding the denied referral.    3) Plan on cervical epidural steroid injection for neck and radicular symptoms. Verified C7-T1 level.     4) offered Bilateral knee injections today. Performed today without issue, see separate procedure note.     5) Was hoping for shoulder injection as well but defer for 4 weeks due to steroid exposure. He verbalized understanding.     Patient is provided with written education material regarding the discussed procedure.    Discussed with the patient the benefits and risk of epidural steroid injection:     The benefit of injection would be:    1. Decreasing in the baseline pain levels  2. Improvement in the  functional status overall in terms of doing more activity comfortably     Risk for the injection would be:  1. Infection in the spine, meningitis or skin where the injection was performed  2. Nerve damage in the spine resulting in permanent numbness, tingling and/or paralysis in the upper and/or lower extremities  3. Failure of injection to relieve the pain  4. Worsening of pain  5. Spinal fluid leak and headaches after the procedure that might require further intervention in form of blood patch or surgical repair  6. Complication resulting from excessive steroid resulting in dizziness, headaches, facial erythema, transient hypertension, gastritis, pruritus and weight gain  7. Complication related to anesthesia and medication allergies resulting in anaphylaxis  8. Bleeding resulting in epidural hematoma requiring monitoring or surgical evacuation      For insurance reviewer: When performing cervical epidural steroid injections needle placement is kept low, most commonly at C7-T1, to lessen the risk of complications. Pathology level on imaging will often not correspond but medicine will be able to spread within the spinal canal. This is standard practice.  Follow up: Return for CESI C7-T1, Dr. Mrad, Left, TFESI, L3, TFESI (2nd Level), L4.    MDM    Number and Complexity of Problems Addressed  1 or more chronic illness with exacerbation, progression, or side effects of treatment    Risk of Complications and/or Morbidity or Mortality of Patient Management  Moderate        Electronically signed:  ____Casey Kaylyn, PA-C  08/07/2024, 10:46____      This note may have been partially generated using MModal Fluency Direct system, and there may be some incorrect words, spellings, and punctuation that were not noted in checking the note before saving, though effort was made to avoid such errors.         [1]   Allergies  Allergen Reactions    Propofol Rash and  Other Adverse Reaction (Add comment)     High temperature  for about 2 weeks (103-104 degree temp); Rash    Lithium  Other Adverse Reaction (Add comment)     Blurred vision   [2]   Social History  Tobacco Use    Smoking status: Light Smoker     Types: Cigars    Smokeless tobacco: Never    Tobacco comments:     OCCASIONAL SMOKING-1 CIGAR EVERY 2-4 WEEKS   Vaping Use    Vaping status: Never Used   Substance Use Topics    Alcohol use: Yes     Comment: Occasional    Drug use: Never

## 2024-08-07 NOTE — Nursing Note (Signed)
 08/07/24 0956   [facility]   Location   (Low back pain that radiates down the left leg to the knee)   Pain: (from 0 - 10) 7   Worst pain rating  9   Number of worst pain incidents in last week 3-4   Words to describe Sharp;Aching   Timing of pain Constant   What exacerbates pain Sitting;Walking;Standing   Activities limited by pain Sitting, standing, walking for long periods of time   Previous Pain Procedures TFESI  (Left L4, L5)   Pain relief after last procedure 10%   Functional Status Improvement None

## 2024-08-07 NOTE — Procedures (Signed)
 [facility]  [address]  [place] [address]  Operated by [facility]  Procedure Note    Name: Sean Wall MRN:  [id number]   Date: 08/07/2024 DOB:  June 05, 1956 (68 y.o.)         Large Joint Arthrocentesis: bilateral knee  Indications: pain  Details: 25 G needle, anterolateral approach  Outcome: tolerated well, no immediate complications    Procedure:  Therapeutic steroid intra-articular knee injection bilateral  Diagnosis:  knee pain   Postprocedure diagnosis: Same  Provider: Augustin Bennetts, PA-C  Assistants: None     This is a patient known to our clinic and is treated for knee pain. The patient is scheduled today for therapeutic knee injection in the office and, after discussing the risks (infection, bleeding, failure of injection, worsening of pain) and benefits of procedure, the patient elected to proceed with the injection.  Patient was seated upright on the procedure table. A time out was immediately performed with the correct patient, procedure, and location confirmed. A sterile preparation of chlorhexidine was applied. The knee was flexed 90 and rested on the floor. A 25 G 1/2 inch needle was introduced anteriorly in the lateral knee compartment. After negative aspiration of blood was confirmed a total of  2 cc of Bupivacaine  .25% and 1 cc of Depo-Medrol  40 Mg was injected. Procedure repeated on the opposite side.     Plan:  The patient tolerated the procedure well today, and will follow-up in clinic in 4 weeks for shoulder injection. Patient was educated regarding post-procedure instructions and advised to monitor the injection site for any signs of reaction. They should contact the office with any questions or concerns.       Augustin Bennetts, PA-C  08/07/2024, 10:47      Consent was given by the patient. Immediately prior to procedure a time out was called to verify the correct patient, procedure, equipment, support staff and site/side marked as  required. Patient was prepped and draped in the usual sterile fashion.

## 2024-08-07 NOTE — Nursing Note (Signed)
 08/07/24 1000   Time Out Procedure   Consent Obtained for Procedure Yes   Correct Procedure Verbalized (Per Patient/Team) Yes   Allergies Verified: Yes   Correct Site/Side Verbalized Yes   Time Out Performed Yes   Participants Introduced/Known Yes   Participants of Time Out C. Kaylyn DEVONNA CANDIE Emeterio LPN   Time Out Verified By CANDIE Emeterio LPN   Time 8968   Date  08/07/24   Staff Completing Form CANDIE Emeterio LPN

## 2024-08-20 ENCOUNTER — Telehealth (HOSPITAL_BASED_OUTPATIENT_CLINIC_OR_DEPARTMENT_OTHER): Payer: Self-pay | Admitting: Emergency Medicine

## 2024-08-20 NOTE — Nursing Note (Signed)
 PAIN PRE-ASSESSMENT TELEPHONE CALL    Verify Procedure - Cervical Epidural Steroid Injection C7-T1 Bilateral # 1    Verify Insurance - [employer]    Exposure to COVID? No    Gender: Male     If male, currently pregnant:  N/A,   N/A    Taking antibiotics or steroids: No    Taking anticoagulants/antiplatelets/NSAID'S: No     Last dose: N/A    Is patient diabetic: No     If yes, advised to eat prior to arrival:  Yes    Was pre-op Valium ordered: No      If yes, did RX get sent: No    Person interviewed: Shahiem    Relationship to patient: self    Arrival date and time verified: Yes    Arrival time given to patient: 8 15    Arrival location verified: Yes    Communicated need to leave valuables at home: Yes    Advised to wear comfortable clothing: Yes    Ride and caregiver arranged: Yes    Name of ride/caregiver: unsure    Phone number for ride/caregiver: unsure    Provider questions (routed to provider):  No          Reda LITTIE Goldsmith, LPN  03/17/7972, 90:80

## 2024-08-21 NOTE — Patient Instructions (Signed)
 [facility]    [address]  [place], [place] [address]  Phone: [phone]  Fax: [phone]    Today you had a Cervical Epidural Steroid Injection, C7-T1.    Post procedure discharge instructions:    Though this procedure is generally safe, and complications are rare, we do ask for you to look for any of the following:    - Swelling, persistent redness and bleeding or discharge from the site of the injection  - Fever and chills  - New onset of severe back pain  - New onset of upper or lower extremity numbness and/or weakness lasting longer than 24 hours  - Difficulty controlling bowel and/or bladder function after the injection    If you experience any of the above, please contact our staff at the number provided above during business hours. After 4:30 pm, weekends, and holidays please go to your local Emergency Room and show them this instruction sheet.    After your injection:    - No driving or operating heavy equipment for 24 hours after the injection  - Rest for the remainder of the day (unless otherwise specified)  - Remove band aid(s) after 24 hours; they do not need to be replaced  - No shower, bath, Jacuzzi, or pool for 24 hours  - Use ice for discomfort at the injection site(s) for the first 24 hours then heat if needed. (20 minutes every 3 hours as needed)  - When applying ice/heat pack ALWAYS use a cloth barrier between your skin and the ice/heat pack  - If you received a steroid medication today you may experience hot flashes and facial redness/flushing and increased appetite.  - Please be aware you are at High Risk of falling due to loss of reflexes  - If you stopped a Blood Thinner you may resume on the next day after your procedure.      If you have any questions please call the nurses line at  [phone] Option 2

## 2024-08-22 ENCOUNTER — Encounter (HOSPITAL_BASED_OUTPATIENT_CLINIC_OR_DEPARTMENT_OTHER): Payer: Self-pay | Admitting: Emergency Medicine

## 2024-08-22 ENCOUNTER — Ambulatory Visit (HOSPITAL_BASED_OUTPATIENT_CLINIC_OR_DEPARTMENT_OTHER)
Admission: RE | Admit: 2024-08-22 | Discharge: 2024-08-22 | Disposition: A | Discharge status: Home / Self Care | Source: Ambulatory Visit | Attending: Emergency Medicine | Admitting: Emergency Medicine

## 2024-08-22 ENCOUNTER — Ambulatory Visit: Attending: Emergency Medicine | Admitting: Emergency Medicine

## 2024-08-22 ENCOUNTER — Other Ambulatory Visit: Payer: Self-pay

## 2024-08-22 VITALS — BP 122/74 | HR 61 | Resp 16

## 2024-08-22 DIAGNOSIS — M5412 Radiculopathy, cervical region: Secondary | ICD-10-CM | POA: Insufficient documentation

## 2024-08-22 DIAGNOSIS — M542 Cervicalgia: Secondary | ICD-10-CM | POA: Insufficient documentation

## 2024-08-22 MED ORDER — IOHEXOL 180 MG IODINE/ML INTRATHECAL SOLUTION
3.0000 mL | Freq: Once | INTRATHECAL | Status: AC
  Administered 2024-08-22: 3 mL via INTRAMUSCULAR

## 2024-08-22 MED ORDER — DEXAMETHASONE SODIUM PHOSPHATE 10 MG/ML INJECTION SOLUTION
10.0000 mg | INTRAMUSCULAR | Status: AC
  Administered 2024-08-22: 10 mg via INTRAMUSCULAR

## 2024-08-22 NOTE — Nursing Note (Signed)
 08/22/24 0840   Procedure ROS  Assessment   Time Point Post Procedure   Skin WDL   Cognitive/Neuro/Behavioral WDL   Respiratory WDL   Positioning Sitting   Patient Safety Up in Chair;Wheels Locked;ID band on;Fall precautions maintained   Cardiac WDL   Gastrointestinal WDL   Genitourinary WDL   Overall Status Vital Signs Stable   Pain Assessment   Time Point Post Procedure   Pain Score 4   Post Procedure Dressing   Dressing Type Bandaid   Dressing Status Clean;dry;intact   Discharge instructions reviewed Yes   Patient verbalized understanding of discuarge instructions Yes

## 2024-08-22 NOTE — Nursing Note (Signed)
 08/22/24 0832   Adult Vitals   Heart Rate 57   SpO2 95 %

## 2024-08-22 NOTE — Nursing Note (Signed)
 08/22/24 9167   Procedure ROS  Assessment   Time Point Intra Procedure   Skin WDL   Positioning prone   Patient Safety Fall precautions maintained   Overall Status Alert and Oriented   Procedure   Consent Obtained for Procedure Yes   Correct Procedure Verbalized (Per Patient/Team) Yes   Procedure site marked? Neck   Correct Site/Side Verbalized Yes   Time Out Performed Yes   Time Out Verified By Burnard Snell RN   Prep Solution Chloroprep   Prep done by Burnard Snell RN   Provider Present Mrad   RN Present Burnard Snell RN   Rad Tech Present Rankin Penner   Surg Tech Present Leotis Mikes

## 2024-08-22 NOTE — Nursing Note (Signed)
 08/22/24 0813   Procedure ROS  Assessment   Time Point Pre Procedure   Skin WDL   Cognitive/Neuro/Behavioral WDL   Respiratory WDL   Musculoskeletal WDL   Positioning Sitting   Patient Safety Up in Chair;Wheels Locked;ID band on;Fall precautions maintained   Cardiac WDL   Gastrointestinal WDL   Genitourinary WDL

## 2024-08-22 NOTE — Procedures (Signed)
 [facility], [facility]  [address]  [place] [address]  Operated by [facility]  Procedure Note    Name: Dareld Mcauliffe Klump MRN:  [id number]   Date: 08/22/2024 DOB:  October 13, 1955 (69 y.o.)         [address]321 - INJECT OF DIAG/THERAP SUBSTANCE, INTERLAMINAR EPIDURAL/SUBARACHNOID, CERVICAL/THORACIC; W/ IMAGING GUIDANCE (AMB ONLY)    Performed by: Madge Duke, MD  Authorized by: Kaylyn Duncan, PA-C    Time Out:     Immediately before the procedure, a time out was called:  Yes    Patient verified:  Yes    Procedure Verified:  Yes    Site Verified:  Yes  Documentation:        Procedure:  Fluoroscopy guided Diagnostic Cervical epidural steroid injection at C7-T1   Pre-procedure Diagnosis: Cervical radiculopathy  Post procedure Diagnosis Same.   Surgeon: Duke Madge, MD.  Assistant: LENNOX.  Anesthesia:  Local   Pre procedure pain level: 7/10     Indication for the procedure This patient is known to our [facility] for neck pain, scheduled today for interlaminar Cervical epidural steroid injection under X-Ray guidance, upon discussion of potential risks and benefits of the procedure including the option of not doing the procedure today, the patient elected to go ahead with the procedure and gave written informed consent.      Procedure in detail:   the patient was brought to the procedure room and was placed prone on the procedure table with pillows under the abdomen and chest. Hemodynamic monitors consisting of noninvasive pulse oximetry were applied and the patient remained hemodynamically stable all throughout the procedure.the neck was extensively prepped and draped in a standard sterile fashion with Chlorhexidine and sterile drape was applied . The C arm was brought above the patient's neck and X-ray were taken to identify the appropriate interspace, we anesthetized the skin at the previously identified interspace with 3 cc of 1% lidocaine  via a 25 G 1 1/2-inch needle. Through the  anesthetized skin we introduced a 20 G epidural Tuohy needle under intermittent fluoroscopic guidance using loss of resistance technique to normal saline, contralateral X-ray was taken and confirmed the epidural needle depth. After negative aspiration for BLOOD, CSF and fluids and no paresthesia whatsoever  Needle placement with confirmed with contrast injection of 2 cc omnipaque  which showed epidural spread of the contrast. we injected a total of 10 mg dexamethasone  and then the needle flushed with 2 cc preservative free Normal Saline. Needle was taken out intact. Recovery The patient tolerated the procedure very well. Neck was cleaned and sterile Band-Aids were applied. The patient returned to the Recovery Room and recovered for an additional 20 minutes before being discharged home with written discharge instructions and a ride.       Plan:  Patient tolerated the procedure well today with no immediate complications, and the patient will follow up in the clinic in 1 month.      Electronically Signed: Duke Madge, MD 08/22/2024, 08:36

## 2024-09-20 ENCOUNTER — Ambulatory Visit (HOSPITAL_BASED_OUTPATIENT_CLINIC_OR_DEPARTMENT_OTHER): Payer: Self-pay | Admitting: Physician Assistant

## 2024-10-08 ENCOUNTER — Ambulatory Visit (HOSPITAL_BASED_OUTPATIENT_CLINIC_OR_DEPARTMENT_OTHER): Payer: Self-pay | Admitting: Emergency Medicine

## 2024-10-08 ENCOUNTER — Ambulatory Visit

## 2024-11-05 ENCOUNTER — Ambulatory Visit (HOSPITAL_BASED_OUTPATIENT_CLINIC_OR_DEPARTMENT_OTHER): Payer: Self-pay | Admitting: Physician Assistant

## 2025-02-10 ENCOUNTER — Ambulatory Visit (HOSPITAL_BASED_OUTPATIENT_CLINIC_OR_DEPARTMENT_OTHER): Payer: Self-pay | Admitting: Gastroenterology

## 2025-02-10 ENCOUNTER — Ambulatory Visit (HOSPITAL_BASED_OUTPATIENT_CLINIC_OR_DEPARTMENT_OTHER): Payer: Self-pay | Admitting: Family

## 2025-03-06 ENCOUNTER — Ambulatory Visit (INDEPENDENT_AMBULATORY_CARE_PROVIDER_SITE_OTHER): Payer: Self-pay | Admitting: Urology
